# Patient Record
Sex: Female | Born: 1950 | Race: Black or African American | Hispanic: No | Smoking: Former smoker
Health system: Southern US, Community
[De-identification: ages and names within clinical notes are randomized; demographics above are authoritative.]

## PROBLEM LIST (undated history)

## (undated) DIAGNOSIS — K635 Polyp of colon: Secondary | ICD-10-CM

## (undated) DIAGNOSIS — K08109 Complete loss of teeth, unspecified cause, unspecified class: Secondary | ICD-10-CM

## (undated) DIAGNOSIS — C73 Malignant neoplasm of thyroid gland: Secondary | ICD-10-CM

## (undated) DIAGNOSIS — M25559 Pain in unspecified hip: Secondary | ICD-10-CM

## (undated) DIAGNOSIS — E079 Disorder of thyroid, unspecified: Secondary | ICD-10-CM

## (undated) DIAGNOSIS — D649 Anemia, unspecified: Secondary | ICD-10-CM

## (undated) DIAGNOSIS — E785 Hyperlipidemia, unspecified: Secondary | ICD-10-CM

## (undated) DIAGNOSIS — K759 Inflammatory liver disease, unspecified: Secondary | ICD-10-CM

## (undated) DIAGNOSIS — I5032 Chronic diastolic (congestive) heart failure: Secondary | ICD-10-CM

## (undated) DIAGNOSIS — E119 Type 2 diabetes mellitus without complications: Secondary | ICD-10-CM

## (undated) DIAGNOSIS — I10 Essential (primary) hypertension: Secondary | ICD-10-CM

## (undated) DIAGNOSIS — D126 Benign neoplasm of colon, unspecified: Secondary | ICD-10-CM

## (undated) DIAGNOSIS — I5189 Other ill-defined heart diseases: Secondary | ICD-10-CM

## (undated) DIAGNOSIS — I509 Heart failure, unspecified: Secondary | ICD-10-CM

## (undated) DIAGNOSIS — R079 Chest pain, unspecified: Secondary | ICD-10-CM

## (undated) DIAGNOSIS — E042 Nontoxic multinodular goiter: Secondary | ICD-10-CM

## (undated) DIAGNOSIS — C539 Malignant neoplasm of cervix uteri, unspecified: Secondary | ICD-10-CM

## (undated) DIAGNOSIS — E039 Hypothyroidism, unspecified: Secondary | ICD-10-CM

## (undated) DIAGNOSIS — Z95828 Presence of other vascular implants and grafts: Secondary | ICD-10-CM

## (undated) DIAGNOSIS — F1490 Cocaine use, unspecified, uncomplicated: Secondary | ICD-10-CM

## (undated) DIAGNOSIS — C3490 Malignant neoplasm of unspecified part of unspecified bronchus or lung: Secondary | ICD-10-CM

## (undated) HISTORY — DX: Malignant neoplasm of unspecified part of unspecified bronchus or lung: C34.90

## (undated) HISTORY — PX: OTHER SURGICAL HISTORY: SHX169

## (undated) HISTORY — DX: Inflammatory liver disease, unspecified: K75.9

## (undated) HISTORY — PX: ABDOMINAL HYSTERECTOMY: SHX81

---

## 2006-02-03 ENCOUNTER — Emergency Department: Payer: Self-pay

## 2006-05-07 ENCOUNTER — Inpatient Hospital Stay: Payer: Self-pay | Admitting: Specialist

## 2006-05-07 ENCOUNTER — Other Ambulatory Visit: Payer: Self-pay

## 2006-10-08 ENCOUNTER — Ambulatory Visit: Payer: Self-pay

## 2007-08-24 ENCOUNTER — Emergency Department: Payer: Self-pay | Admitting: Emergency Medicine

## 2007-11-23 ENCOUNTER — Ambulatory Visit: Payer: Self-pay

## 2008-11-14 ENCOUNTER — Ambulatory Visit: Payer: Self-pay | Admitting: Nurse Practitioner

## 2008-11-27 ENCOUNTER — Ambulatory Visit: Payer: Self-pay | Admitting: Nurse Practitioner

## 2009-02-03 ENCOUNTER — Ambulatory Visit: Payer: Self-pay | Admitting: Family Medicine

## 2009-07-31 DIAGNOSIS — E559 Vitamin D deficiency, unspecified: Secondary | ICD-10-CM | POA: Insufficient documentation

## 2010-03-06 ENCOUNTER — Ambulatory Visit: Payer: Self-pay | Admitting: Family Medicine

## 2011-05-29 DIAGNOSIS — R609 Edema, unspecified: Secondary | ICD-10-CM | POA: Insufficient documentation

## 2011-05-30 ENCOUNTER — Ambulatory Visit: Payer: Self-pay | Admitting: Family Medicine

## 2011-07-02 ENCOUNTER — Ambulatory Visit: Payer: Self-pay | Admitting: Family Medicine

## 2011-09-13 DIAGNOSIS — M720 Palmar fascial fibromatosis [Dupuytren]: Secondary | ICD-10-CM | POA: Insufficient documentation

## 2011-10-09 ENCOUNTER — Ambulatory Visit: Payer: Self-pay | Admitting: Family Medicine

## 2011-11-15 DIAGNOSIS — G629 Polyneuropathy, unspecified: Secondary | ICD-10-CM | POA: Insufficient documentation

## 2012-06-15 DIAGNOSIS — M542 Cervicalgia: Secondary | ICD-10-CM | POA: Insufficient documentation

## 2012-07-24 DIAGNOSIS — E118 Type 2 diabetes mellitus with unspecified complications: Secondary | ICD-10-CM | POA: Insufficient documentation

## 2012-07-24 DIAGNOSIS — E119 Type 2 diabetes mellitus without complications: Secondary | ICD-10-CM | POA: Insufficient documentation

## 2012-10-19 DIAGNOSIS — R32 Unspecified urinary incontinence: Secondary | ICD-10-CM | POA: Insufficient documentation

## 2012-12-31 ENCOUNTER — Encounter: Payer: Self-pay | Admitting: Family Medicine

## 2013-01-06 ENCOUNTER — Ambulatory Visit: Payer: Self-pay | Admitting: Orthopedic Surgery

## 2013-01-27 ENCOUNTER — Encounter: Payer: Self-pay | Admitting: Family Medicine

## 2013-01-29 DIAGNOSIS — K5900 Constipation, unspecified: Secondary | ICD-10-CM | POA: Insufficient documentation

## 2013-03-04 ENCOUNTER — Ambulatory Visit: Payer: Self-pay | Admitting: Family Medicine

## 2013-03-05 DIAGNOSIS — M48061 Spinal stenosis, lumbar region without neurogenic claudication: Secondary | ICD-10-CM | POA: Insufficient documentation

## 2013-09-27 ENCOUNTER — Encounter: Payer: Self-pay | Admitting: Family Medicine

## 2013-10-27 ENCOUNTER — Encounter: Payer: Self-pay | Admitting: Family Medicine

## 2013-11-01 ENCOUNTER — Emergency Department: Payer: Self-pay | Admitting: Emergency Medicine

## 2013-11-01 LAB — COMPREHENSIVE METABOLIC PANEL
Albumin: 3 g/dL — ABNORMAL LOW (ref 3.4–5.0)
Alkaline Phosphatase: 70 U/L
Anion Gap: 5 — ABNORMAL LOW (ref 7–16)
BUN: 8 mg/dL (ref 7–18)
Bilirubin,Total: 0.2 mg/dL (ref 0.2–1.0)
CALCIUM: 8.4 mg/dL — AB (ref 8.5–10.1)
Chloride: 109 mmol/L — ABNORMAL HIGH (ref 98–107)
Co2: 27 mmol/L (ref 21–32)
Creatinine: 1.13 mg/dL (ref 0.60–1.30)
EGFR (Non-African Amer.): 51 — ABNORMAL LOW
GFR CALC AF AMER: 59 — AB
Glucose: 102 mg/dL — ABNORMAL HIGH (ref 65–99)
Osmolality: 280 (ref 275–301)
Potassium: 4 mmol/L (ref 3.5–5.1)
SGOT(AST): 19 U/L (ref 15–37)
SGPT (ALT): 22 U/L (ref 12–78)
Sodium: 141 mmol/L (ref 136–145)
Total Protein: 7.3 g/dL (ref 6.4–8.2)

## 2013-11-01 LAB — CBC
HCT: 37.7 % (ref 35.0–47.0)
HGB: 12.1 g/dL (ref 12.0–16.0)
MCH: 29.1 pg (ref 26.0–34.0)
MCHC: 32.3 g/dL (ref 32.0–36.0)
MCV: 90 fL (ref 80–100)
Platelet: 347 10*3/uL (ref 150–440)
RBC: 4.17 10*6/uL (ref 3.80–5.20)
RDW: 15.8 % — ABNORMAL HIGH (ref 11.5–14.5)
WBC: 9 10*3/uL (ref 3.6–11.0)

## 2013-11-01 LAB — TROPONIN I: Troponin-I: <0.02 ng/mL

## 2013-11-01 LAB — D-DIMER(ARMC): D-Dimer: 739 ng/ml

## 2013-11-01 LAB — CK TOTAL AND CKMB (NOT AT ARMC)
CK, Total: 145 U/L
CK-MB: 1.4 ng/mL (ref 0.5–3.6)

## 2014-02-10 DIAGNOSIS — E041 Nontoxic single thyroid nodule: Secondary | ICD-10-CM | POA: Insufficient documentation

## 2014-02-10 DIAGNOSIS — F17200 Nicotine dependence, unspecified, uncomplicated: Secondary | ICD-10-CM | POA: Insufficient documentation

## 2014-04-12 ENCOUNTER — Ambulatory Visit: Payer: Self-pay | Admitting: Family Medicine

## 2014-05-17 DIAGNOSIS — E782 Mixed hyperlipidemia: Secondary | ICD-10-CM | POA: Insufficient documentation

## 2014-05-17 DIAGNOSIS — I5032 Chronic diastolic (congestive) heart failure: Secondary | ICD-10-CM | POA: Insufficient documentation

## 2014-10-10 ENCOUNTER — Other Ambulatory Visit: Payer: Self-pay | Admitting: Family Medicine

## 2014-10-10 DIAGNOSIS — F17200 Nicotine dependence, unspecified, uncomplicated: Secondary | ICD-10-CM

## 2014-10-18 ENCOUNTER — Ambulatory Visit: Payer: Self-pay

## 2014-10-25 ENCOUNTER — Ambulatory Visit: Payer: Medicare Other | Attending: Family Medicine

## 2014-10-25 DIAGNOSIS — F17210 Nicotine dependence, cigarettes, uncomplicated: Secondary | ICD-10-CM | POA: Diagnosis present

## 2014-10-25 DIAGNOSIS — F17200 Nicotine dependence, unspecified, uncomplicated: Secondary | ICD-10-CM

## 2014-10-25 MED ORDER — ALBUTEROL SULFATE (2.5 MG/3ML) 0.083% IN NEBU
2.5000 mg | INHALATION_SOLUTION | Freq: Once | RESPIRATORY_TRACT | Status: AC
  Administered 2014-10-25: 2.5 mg via RESPIRATORY_TRACT
  Filled 2014-10-25: qty 3

## 2014-11-18 DIAGNOSIS — I10 Essential (primary) hypertension: Secondary | ICD-10-CM | POA: Insufficient documentation

## 2014-11-24 DIAGNOSIS — I872 Venous insufficiency (chronic) (peripheral): Secondary | ICD-10-CM | POA: Insufficient documentation

## 2015-07-24 ENCOUNTER — Other Ambulatory Visit: Payer: Self-pay | Admitting: Family Medicine

## 2015-07-24 DIAGNOSIS — Z0000 Encounter for general adult medical examination without abnormal findings: Secondary | ICD-10-CM

## 2015-07-25 ENCOUNTER — Other Ambulatory Visit: Payer: Self-pay | Admitting: Family Medicine

## 2015-07-25 DIAGNOSIS — Z0000 Encounter for general adult medical examination without abnormal findings: Secondary | ICD-10-CM

## 2015-08-21 ENCOUNTER — Ambulatory Visit
Admission: RE | Admit: 2015-08-21 | Discharge: 2015-08-21 | Disposition: A | Discharge status: Home / Self Care | Payer: Medicare Other | Source: Ambulatory Visit | Attending: Family Medicine | Admitting: Family Medicine

## 2015-08-21 DIAGNOSIS — M85852 Other specified disorders of bone density and structure, left thigh: Secondary | ICD-10-CM | POA: Insufficient documentation

## 2015-08-21 DIAGNOSIS — Z0000 Encounter for general adult medical examination without abnormal findings: Secondary | ICD-10-CM

## 2015-08-21 DIAGNOSIS — Z1231 Encounter for screening mammogram for malignant neoplasm of breast: Secondary | ICD-10-CM | POA: Insufficient documentation

## 2015-08-24 ENCOUNTER — Other Ambulatory Visit: Payer: Self-pay | Admitting: Family Medicine

## 2015-08-24 ENCOUNTER — Ambulatory Visit: Payer: Medicare Other

## 2015-08-24 DIAGNOSIS — M79661 Pain in right lower leg: Secondary | ICD-10-CM

## 2015-08-25 ENCOUNTER — Ambulatory Visit
Admission: RE | Admit: 2015-08-25 | Discharge: 2015-08-25 | Disposition: A | Discharge status: Home / Self Care | Payer: Medicare Other | Source: Ambulatory Visit | Attending: Family Medicine | Admitting: Family Medicine

## 2015-08-25 DIAGNOSIS — M79661 Pain in right lower leg: Secondary | ICD-10-CM | POA: Insufficient documentation

## 2015-10-24 DIAGNOSIS — N3946 Mixed incontinence: Secondary | ICD-10-CM | POA: Insufficient documentation

## 2015-11-23 ENCOUNTER — Encounter: Payer: Self-pay | Admitting: *Deleted

## 2015-11-24 ENCOUNTER — Encounter: Payer: Self-pay | Admitting: *Deleted

## 2015-11-24 ENCOUNTER — Encounter
Admission: RE | Disposition: A | Discharge status: Home / Self Care | Payer: Self-pay | Source: Ambulatory Visit | Attending: Gastroenterology

## 2015-11-24 ENCOUNTER — Ambulatory Visit: Payer: Medicare Other | Admitting: Anesthesiology

## 2015-11-24 ENCOUNTER — Ambulatory Visit
Admission: RE | Admit: 2015-11-24 | Discharge: 2015-11-24 | Disposition: A | Discharge status: Home / Self Care | Payer: Medicare Other | Source: Ambulatory Visit | Attending: Gastroenterology | Admitting: Gastroenterology

## 2015-11-24 DIAGNOSIS — I10 Essential (primary) hypertension: Secondary | ICD-10-CM | POA: Diagnosis not present

## 2015-11-24 DIAGNOSIS — Z7984 Long term (current) use of oral hypoglycemic drugs: Secondary | ICD-10-CM | POA: Diagnosis not present

## 2015-11-24 DIAGNOSIS — K5730 Diverticulosis of large intestine without perforation or abscess without bleeding: Secondary | ICD-10-CM | POA: Diagnosis not present

## 2015-11-24 DIAGNOSIS — D123 Benign neoplasm of transverse colon: Secondary | ICD-10-CM | POA: Diagnosis not present

## 2015-11-24 DIAGNOSIS — Z7982 Long term (current) use of aspirin: Secondary | ICD-10-CM | POA: Diagnosis not present

## 2015-11-24 DIAGNOSIS — D125 Benign neoplasm of sigmoid colon: Secondary | ICD-10-CM | POA: Diagnosis not present

## 2015-11-24 DIAGNOSIS — E042 Nontoxic multinodular goiter: Secondary | ICD-10-CM | POA: Insufficient documentation

## 2015-11-24 DIAGNOSIS — Z79899 Other long term (current) drug therapy: Secondary | ICD-10-CM | POA: Insufficient documentation

## 2015-11-24 DIAGNOSIS — D128 Benign neoplasm of rectum: Secondary | ICD-10-CM | POA: Insufficient documentation

## 2015-11-24 DIAGNOSIS — R195 Other fecal abnormalities: Secondary | ICD-10-CM | POA: Diagnosis present

## 2015-11-24 DIAGNOSIS — Z8371 Family history of colonic polyps: Secondary | ICD-10-CM | POA: Diagnosis not present

## 2015-11-24 DIAGNOSIS — K640 First degree hemorrhoids: Secondary | ICD-10-CM | POA: Insufficient documentation

## 2015-11-24 DIAGNOSIS — E119 Type 2 diabetes mellitus without complications: Secondary | ICD-10-CM | POA: Insufficient documentation

## 2015-11-24 DIAGNOSIS — F17200 Nicotine dependence, unspecified, uncomplicated: Secondary | ICD-10-CM | POA: Insufficient documentation

## 2015-11-24 HISTORY — DX: Chest pain, unspecified: R07.9

## 2015-11-24 HISTORY — DX: Essential (primary) hypertension: I10

## 2015-11-24 HISTORY — PX: COLONOSCOPY WITH PROPOFOL: SHX5780

## 2015-11-24 HISTORY — DX: Pain in unspecified hip: M25.559

## 2015-11-24 HISTORY — DX: Nontoxic multinodular goiter: E04.2

## 2015-11-24 HISTORY — DX: Type 2 diabetes mellitus without complications: E11.9

## 2015-11-24 LAB — GLUCOSE, CAPILLARY: GLUCOSE-CAPILLARY: 110 mg/dL — AB (ref 65–99)

## 2015-11-24 SURGERY — COLONOSCOPY WITH PROPOFOL
Anesthesia: General

## 2015-11-24 MED ORDER — SODIUM CHLORIDE 0.9 % IV SOLN: INTRAVENOUS | Status: DC

## 2015-11-24 MED ORDER — MIDAZOLAM HCL 2 MG/2ML IJ SOLN
INTRAMUSCULAR | Status: DC | PRN
  Administered 2015-11-24: 1 mg via INTRAVENOUS

## 2015-11-24 MED ORDER — PHENYLEPHRINE HCL 10 MG/ML IJ SOLN
INTRAMUSCULAR | Status: DC | PRN
  Administered 2015-11-24: 200 ug via INTRAVENOUS
  Administered 2015-11-24 (×3): 100 ug via INTRAVENOUS

## 2015-11-24 MED ORDER — FENTANYL CITRATE (PF) 100 MCG/2ML IJ SOLN
INTRAMUSCULAR | Status: DC | PRN
  Administered 2015-11-24: 50 ug via INTRAVENOUS

## 2015-11-24 MED ORDER — SODIUM CHLORIDE 0.9 % IV SOLN
INTRAVENOUS | Status: DC
  Administered 2015-11-24: 08:00:00 via INTRAVENOUS

## 2015-11-24 MED ORDER — PROPOFOL 10 MG/ML IV BOLUS
INTRAVENOUS | Status: DC | PRN
  Administered 2015-11-24: 80 mg via INTRAVENOUS

## 2015-11-24 MED ORDER — PROPOFOL 500 MG/50ML IV EMUL
INTRAVENOUS | Status: DC | PRN
  Administered 2015-11-24: 100 ug/kg/min via INTRAVENOUS

## 2015-11-24 NOTE — H&P (Addendum)
Outpatient short stay form Pre-procedure 11/24/2015 8:14 AM Lollie Sails MD  Primary Physician: Dr Loma Newton  Reason for visit:  Colonoscopy  History of present illness:  Patient is a 65 year old female presenting today for colonoscopy. This is her first colonoscopy. She has a family history of colon polyps and multiple primary relatives. There is a finding of a Hemoccult-positive stool as well. She tolerated her prep well. She takes 81 mg aspirin but has held that. She takes no other aspirin products or blood thinners.    Current Facility-Administered Medications:  .  0.9 %  sodium chloride infusion, , Intravenous, Continuous, Lollie Sails, MD, Last Rate: 20 mL/hr at 11/24/15 [phone] .  0.9 %  sodium chloride infusion, , Intravenous, Continuous, Lollie Sails, MD  Prescriptions Prior to Admission  Medication Sig Dispense Refill Last Dose  . aspirin EC 81 MG tablet Take 81 mg by mouth daily.   11/20/2015  . Calcium Carbonate-Vitamin D (CALCIUM 500 + D) 500-125 MG-UNIT TABS Take by mouth.   Past Week at Unknown time  . docusate sodium (COLACE) 100 MG capsule Take 100 mg by mouth daily.     . furosemide (LASIX) 20 MG tablet Take 20 mg by mouth 2 (two) times daily.     . hydrOXYzine (ATARAX/VISTARIL) 25 MG tablet Take 25 mg by mouth daily. Take 25mg . By mouth every morning   11/24/2015 at 0800  . losartan (COZAAR) 100 MG tablet Take 100 mg by mouth daily.   11/24/2015 at 0600  . metFORMIN (GLUCOPHAGE) 500 MG tablet Take 500 mg by mouth 2 (two) times daily with a meal.   11/22/2015  . oxybutynin (DITROPAN-XL) 10 MG 24 hr tablet Take 10 mg by mouth at bedtime.     . pregabalin (LYRICA) 75 MG capsule Take 75 mg by mouth daily.   11/24/2015 at 0600  . simvastatin (ZOCOR) 20 MG tablet Take 20 mg by mouth daily.        Allergies  Allergen Reactions  . Ace Inhibitors Itching    Rash  . Cyclobenzaprine Itching    Rash and 'made me nervous'     Past Medical History:  Diagnosis Date  .  Chest pain, unspecified   . Diabetes mellitus without complication ([place])   . Hip pain   . Hypertension   . Multinodular goiter     Review of systems:      Physical Exam    Heart and lungs: Regular rate and rhythm without rub or gallop, lungs are bilaterally clear.    HEENT: Normocephalic atraumatic eyes are anicteric    Other:     Pertinant exam for procedure: Soft, obese, nontender, bowel sounds are positive normoactive.    Planned proceedures: Colonoscopy and indicated procedures. I have discussed the risks benefits and complications of procedures to include not limited to bleeding, infection, perforation and the risk of sedation and the patient wishes to proceed.    Lollie Sails, MD Gastroenterology 11/24/2015  8:14 AM

## 2015-11-24 NOTE — Op Note (Signed)
[facility] Gastroenterology Patient Name: Rebecca Parker Procedure Date: 11/24/2015 8:23 AM MRN: [id number] Account #: [id number] Date of Birth: 06/02/1950 Admit Type: Outpatient Age: 65 Room: [facility] ENDO ROOM 4 Gender: Female Note Status: Finalized Procedure:            Colonoscopy Indications:          Heme positive stool, Family history of colonic polyps                        in a first-degree relative Providers:            Lollie Sails, MD Referring MD:         Dr Juanda Crumble drew clinic, MD (Referring MD) Medicines:            Monitored Anesthesia Care Complications:        No immediate complications. Procedure:            Pre-Anesthesia Assessment:                       - ASA Grade Assessment: III - A patient with severe                        systemic disease.                       After obtaining informed consent, the colonoscope was                        passed under direct vision. Throughout the procedure,                        the patient's blood pressure, pulse, and oxygen                        saturations were monitored continuously. The                        Colonoscope was introduced through the anus and                        advanced to the the cecum, identified by appendiceal                        orifice and ileocecal valve. The colonoscopy was                        unusually difficult due to poor bowel prep. Successful                        completion of the procedure was aided by changing the                        patient to a prone position and lavage. The patient                        tolerated the procedure well. The quality of the bowel                        preparation was fair except the ascending colon was  poor. Findings:      A 10 mm polyp was found in the transverse colon. The polyp was       semi-pedunculated. The polyp was removed with a cold snare. Resection       and retrieval were complete.  Two sessile polyps were found in the distal sigmoid colon. The polyps       were 2 to 3 mm in size. These polyps were removed with a cold biopsy       forceps. Resection and retrieval were complete.      Two sessile polyps were found in the rectum. The polyps were 1 to 2 mm       in size. These polyps were removed with a cold biopsy forceps. Resection       and retrieval were complete.      Multiple small and large-mouthed diverticula were found in the sigmoid       colon, descending colon and transverse colon.      Non-bleeding internal hemorrhoids were found during retroflexion. The       hemorrhoids were medium-sized and Grade I (internal hemorrhoids that do       not prolapse).      No additional abnormalities were found on retroflexion.      The digital rectal exam was normal. Impression:           - One 10 mm polyp in the transverse colon, removed with                        a cold snare. Resected and retrieved.                       - Two 2 to 3 mm polyps in the distal sigmoid colon,                        removed with a cold biopsy forceps. Resected and                        retrieved.                       - Two 1 to 2 mm polyps in the rectum, removed with a                        cold biopsy forceps. Resected and retrieved.                       - Diverticulosis in the sigmoid colon, in the                        descending colon and in the transverse colon.                       - Non-bleeding internal hemorrhoids. Recommendation:       - Discharge patient to home.                       - Await pathology results.                       - Telephone GI clinic for pathology results in 1 week. Procedure Code(s):    --- Professional ---  45385, Colonoscopy, flexible; with removal of tumor(s),                        polyp(s), or other lesion(s) by snare technique                       45380, 59, Colonoscopy, flexible; with biopsy, single                         or multiple Diagnosis Code(s):    --- Professional ---                       D12.3, Benign neoplasm of transverse colon (hepatic                        flexure or splenic flexure)                       D12.5, Benign neoplasm of sigmoid colon                       K62.1, Rectal polyp                       K64.0, First degree hemorrhoids                       R19.5, Other fecal abnormalities                       Z83.71, Family history of colonic polyps                       K57.30, Diverticulosis of large intestine without                        perforation or abscess without bleeding CPT copyright 2016 American Medical Association. All rights reserved. The codes documented in this report are preliminary and upon coder review may  be revised to meet current compliance requirements. Lollie Sails, MD 11/24/2015 9:07:08 AM This report has been signed electronically. Number of Addenda: 0 Note Initiated On: 11/24/2015 8:23 AM Scope Withdrawal Time: 0 hours 14 minutes 5 seconds  Total Procedure Duration: 0 hours 30 minutes 43 seconds       [facility]

## 2015-11-24 NOTE — Transfer of Care (Signed)
Immediate Anesthesia Transfer of Care Note  Patient: Rebecca Parker  Procedure(s) Performed: Procedure(s): COLONOSCOPY WITH PROPOFOL (N/A)  Patient Location: PACU and Endoscopy Unit  Anesthesia Type:General  Level of Consciousness: awake and patient cooperative  Airway & Oxygen Therapy: Patient Spontanous Breathing  Post-op Assessment: Report given to RN and Post -op Vital signs reviewed and stable  Post vital signs: Reviewed and stable  Last Vitals:  Vitals:   11/24/15 0900 11/24/15 0907  BP:  (!) 100/52  Pulse:    Resp:  20  Temp: (!) 36.1 C (!) 36 C    Last Pain:  Vitals:   11/24/15 0750  TempSrc: Tympanic         Complications: No apparent anesthesia complications

## 2015-11-24 NOTE — Anesthesia Preprocedure Evaluation (Addendum)
Anesthesia Evaluation  Patient identified by MRN, date of birth, ID band Patient awake    Reviewed: Allergy & Precautions, H&P , NPO status , Patient's Chart, lab work & pertinent test results, reviewed documented beta blocker date and time   Airway Mallampati: II   Neck ROM: full    Dental  (+) Poor Dentition   Pulmonary neg pulmonary ROS, Current Smoker,    Pulmonary exam normal        Cardiovascular hypertension, negative cardio ROS Normal cardiovascular exam     Neuro/Psych negative neurological ROS  negative psych ROS   GI/Hepatic negative GI ROS, Neg liver ROS,   Endo/Other  negative endocrine ROSdiabetes  Renal/GU negative Renal ROS  negative genitourinary   Musculoskeletal   Abdominal   Peds  Hematology negative hematology ROS (+)   Anesthesia Other Findings Past Medical History: No date: Chest pain, unspecified No date: Diabetes mellitus without complication (HCC) No date: Hip pain No date: Hypertension No date: Multinodular goiter Past Surgical History: No date: right ankle orif BMI    Body Mass Index:  45.18 kg/m     Reproductive/Obstetrics                             Anesthesia Physical Anesthesia Plan  ASA: III  Anesthesia Plan: General   Post-op Pain Management:    Induction:   Airway Management Planned:   Additional Equipment:   Intra-op Plan:   Post-operative Plan:   Informed Consent: I have reviewed the patients History and Physical, chart, labs and discussed the procedure including the risks, benefits and alternatives for the proposed anesthesia with the patient or authorized representative who has indicated his/her understanding and acceptance.   Dental Advisory Given  Plan Discussed with: CRNA  Anesthesia Plan Comments:         Anesthesia Quick Evaluation

## 2015-11-25 NOTE — Anesthesia Postprocedure Evaluation (Signed)
Anesthesia Post Note  Patient: Rebecca Parker  Procedure(s) Performed: Procedure(s) (LRB): COLONOSCOPY WITH PROPOFOL (N/A)  Patient location during evaluation: PACU Anesthesia Type: General Level of consciousness: awake and alert Pain management: pain level controlled Vital Signs Assessment: post-procedure vital signs reviewed and stable Respiratory status: spontaneous breathing, nonlabored ventilation, respiratory function stable and patient connected to nasal cannula oxygen Cardiovascular status: blood pressure returned to baseline and stable Postop Assessment: no signs of nausea or vomiting Anesthetic complications: no    Last Vitals:  Vitals:   11/24/15 0930 11/24/15 0940  BP: 123/65 104/68  Pulse:    Resp:    Temp:      Last Pain:  Vitals:   11/24/15 0900  TempSrc: Tympanic                 Molli Barrows

## 2015-11-27 ENCOUNTER — Encounter: Payer: Self-pay | Admitting: Gastroenterology

## 2015-11-27 LAB — SURGICAL PATHOLOGY

## 2016-08-20 ENCOUNTER — Other Ambulatory Visit: Payer: Self-pay | Admitting: Family Medicine

## 2016-08-20 DIAGNOSIS — Z1231 Encounter for screening mammogram for malignant neoplasm of breast: Secondary | ICD-10-CM

## 2016-09-12 ENCOUNTER — Ambulatory Visit
Admission: RE | Admit: 2016-09-12 | Discharge: 2016-09-12 | Disposition: A | Discharge status: Home / Self Care | Payer: Medicare Other | Source: Ambulatory Visit | Attending: Family Medicine | Admitting: Family Medicine

## 2016-09-12 DIAGNOSIS — Z1231 Encounter for screening mammogram for malignant neoplasm of breast: Secondary | ICD-10-CM | POA: Insufficient documentation

## 2017-08-12 ENCOUNTER — Other Ambulatory Visit: Payer: Self-pay | Admitting: Family Medicine

## 2017-08-12 DIAGNOSIS — Z1231 Encounter for screening mammogram for malignant neoplasm of breast: Secondary | ICD-10-CM

## 2017-09-25 ENCOUNTER — Ambulatory Visit
Admission: RE | Admit: 2017-09-25 | Discharge: 2017-09-25 | Disposition: A | Discharge status: Home / Self Care | Payer: 59 | Source: Ambulatory Visit | Attending: Family Medicine | Admitting: Family Medicine

## 2017-09-25 ENCOUNTER — Other Ambulatory Visit: Payer: Self-pay | Admitting: Family Medicine

## 2017-09-25 DIAGNOSIS — Z1231 Encounter for screening mammogram for malignant neoplasm of breast: Secondary | ICD-10-CM | POA: Diagnosis present

## 2017-09-29 ENCOUNTER — Other Ambulatory Visit: Payer: Self-pay | Admitting: Family Medicine

## 2017-09-29 DIAGNOSIS — R928 Other abnormal and inconclusive findings on diagnostic imaging of breast: Secondary | ICD-10-CM

## 2017-09-29 DIAGNOSIS — N6320 Unspecified lump in the left breast, unspecified quadrant: Secondary | ICD-10-CM

## 2017-10-08 ENCOUNTER — Ambulatory Visit
Admission: RE | Admit: 2017-10-08 | Discharge: 2017-10-08 | Disposition: A | Discharge status: Home / Self Care | Payer: 59 | Source: Ambulatory Visit | Attending: Family Medicine | Admitting: Family Medicine

## 2017-10-08 DIAGNOSIS — N6320 Unspecified lump in the left breast, unspecified quadrant: Secondary | ICD-10-CM

## 2017-10-08 DIAGNOSIS — R928 Other abnormal and inconclusive findings on diagnostic imaging of breast: Secondary | ICD-10-CM | POA: Diagnosis not present

## 2018-05-25 ENCOUNTER — Other Ambulatory Visit: Payer: Self-pay | Admitting: Family Medicine

## 2018-05-25 DIAGNOSIS — N6320 Unspecified lump in the left breast, unspecified quadrant: Secondary | ICD-10-CM

## 2018-06-18 ENCOUNTER — Ambulatory Visit
Admission: RE | Admit: 2018-06-18 | Discharge: 2018-06-18 | Disposition: A | Discharge status: Home / Self Care | Payer: Medicare Other | Source: Ambulatory Visit | Attending: Family Medicine | Admitting: Family Medicine

## 2018-06-18 DIAGNOSIS — N6320 Unspecified lump in the left breast, unspecified quadrant: Secondary | ICD-10-CM

## 2018-06-18 DIAGNOSIS — R922 Inconclusive mammogram: Secondary | ICD-10-CM | POA: Diagnosis not present

## 2018-10-21 ENCOUNTER — Other Ambulatory Visit (HOSPITAL_COMMUNITY): Payer: Self-pay | Admitting: Family Medicine

## 2018-10-21 ENCOUNTER — Other Ambulatory Visit: Payer: Self-pay | Admitting: Family Medicine

## 2018-10-21 DIAGNOSIS — R634 Abnormal weight loss: Secondary | ICD-10-CM

## 2018-10-22 ENCOUNTER — Other Ambulatory Visit: Payer: Self-pay | Admitting: Family Medicine

## 2018-10-22 DIAGNOSIS — R634 Abnormal weight loss: Secondary | ICD-10-CM

## 2018-10-27 ENCOUNTER — Encounter: Payer: Self-pay | Admitting: *Deleted

## 2018-10-28 ENCOUNTER — Ambulatory Visit: Payer: Medicare Other

## 2018-11-04 ENCOUNTER — Other Ambulatory Visit: Payer: Self-pay

## 2018-11-04 ENCOUNTER — Ambulatory Visit
Admission: RE | Admit: 2018-11-04 | Discharge: 2018-11-04 | Disposition: A | Discharge status: Home / Self Care | Payer: Medicare Other | Source: Ambulatory Visit | Attending: Family Medicine | Admitting: Family Medicine

## 2018-11-04 DIAGNOSIS — R634 Abnormal weight loss: Secondary | ICD-10-CM | POA: Insufficient documentation

## 2018-11-04 HISTORY — DX: Heart failure, unspecified: I50.9

## 2018-11-04 LAB — POCT I-STAT CREATININE: Creatinine, Ser: 1 mg/dL (ref 0.44–1.00)

## 2018-11-04 MED ORDER — IOHEXOL 300 MG/ML  SOLN
100.0000 mL | Freq: Once | INTRAMUSCULAR | Status: AC | PRN
  Administered 2018-11-04: 100 mL via INTRAVENOUS

## 2018-12-09 ENCOUNTER — Ambulatory Visit: Payer: Medicare Other | Admitting: Gastroenterology

## 2018-12-29 ENCOUNTER — Encounter: Payer: Self-pay | Admitting: Family Medicine

## 2019-01-06 ENCOUNTER — Other Ambulatory Visit: Payer: Self-pay

## 2019-01-07 ENCOUNTER — Ambulatory Visit: Payer: Medicare Other | Admitting: Gastroenterology

## 2019-02-24 ENCOUNTER — Other Ambulatory Visit: Payer: Self-pay

## 2019-02-24 ENCOUNTER — Other Ambulatory Visit: Payer: Self-pay | Admitting: Gastroenterology

## 2019-02-24 ENCOUNTER — Other Ambulatory Visit: Payer: Medicare Other

## 2019-02-24 ENCOUNTER — Encounter: Payer: Self-pay | Admitting: Gastroenterology

## 2019-02-24 ENCOUNTER — Ambulatory Visit (INDEPENDENT_AMBULATORY_CARE_PROVIDER_SITE_OTHER): Payer: Medicare Other | Admitting: Gastroenterology

## 2019-02-24 VITALS — BP 121/89 | HR 106 | Temp 97.5°F | Ht 61.0 in | Wt 172.5 lb

## 2019-02-24 DIAGNOSIS — D649 Anemia, unspecified: Secondary | ICD-10-CM | POA: Diagnosis not present

## 2019-02-24 DIAGNOSIS — R109 Unspecified abdominal pain: Secondary | ICD-10-CM | POA: Diagnosis not present

## 2019-02-24 NOTE — Progress Notes (Signed)
Rebecca Parker [address]  [place]  [place], [place] [address]  Main: [phone]  Fax: [phone]   Gastroenterology Consultation  Referring Provider:     Elba Barman, MD Primary Care Physician:  [facility] Reason for Consultation:     Weight loss, abdominal pain, anemia          HPI:    Chief Complaint  Patient presents with  . New Patient (Initial Visit)  . Weight Loss    Patient went from 200 to 150 in 90 days     Rebecca Parker is a 68 y.o. y/o female referred for consultation & management  by Dr. Domingo Madeira, [place].  Patient reportedly has above-stated weight loss in the last 2 to 3 months.  Also reports mid abdominal pain intermittently.  No nausea or vomiting. CT scan in July 2020 for the pain did not show any acute findings to explain her symptoms.  Colonic diverticulosis without diverticulitis was noted.  Patient was also found to be anemic in May 2020 with hemoglobin of 7.5 and MCV of 75.Repeat testing in August 2020 reported normal hemoglobin around 12.  This is a new finding for her.  The patient denies anorexia, nausea or vomiting, dysphagia, change in bowel habits or black or bloody stools or weight loss.   Old records reviewed, prior colonoscopy was in 2017 with Dr. Gustavo Lah for heme positive stool.  1, 10 mm polyp and other subcentimeter polyps removed.  Fair prep noted. Pathology showed tubular adenoma.  No prior EGD.  Past Medical History:  Diagnosis Date  . Chest pain, unspecified   . CHF (congestive heart failure) ([place])   . Diabetes mellitus without complication ([place])   . Hip pain   . Hypertension   . Multinodular goiter     Past Surgical History:  Procedure Laterality Date  . COLONOSCOPY WITH PROPOFOL N/A 11/24/2015   Procedure: COLONOSCOPY WITH PROPOFOL;  Surgeon: Lollie Sails, MD;  Location: [facility] ENDOSCOPY;  Service: Endoscopy;  Laterality: N/A;  . right ankle orif       Prior to Admission medications   Medication Sig Start Date End Date Taking? Authorizing Provider  aspirin EC 81 MG tablet Take 81 mg by mouth daily.   Yes [provider]  Calcium Carbonate-Vitamin D (CALCIUM 500 + D) 500-125 MG-UNIT TABS Take by mouth.   Yes [provider]  docusate sodium (COLACE) 100 MG capsule Take 100 mg by mouth daily.   Yes [provider]  furosemide (LASIX) 20 MG tablet Take 20 mg by mouth 2 (two) times daily.   Yes [provider]  gabapentin (NEURONTIN) 800 MG tablet Take by mouth. 12/20/13  Yes [provider]  hydrochlorothiazide (HYDRODIURIL) 25 MG tablet Take by mouth.   Yes [provider]  hydrOXYzine (ATARAX/VISTARIL) 25 MG tablet Take 25 mg by mouth daily. Take 25mg . By mouth every morning   Yes [provider]  losartan (COZAAR) 100 MG tablet Take 100 mg by mouth daily.   Yes [provider]  metFORMIN (GLUCOPHAGE) 500 MG tablet Take 500 mg by mouth 2 (two) times daily with a meal.   Yes [provider]  potassium chloride (K-DUR) 10 MEQ tablet Take by mouth. 10/27/15  Yes [provider]  sertraline (ZOLOFT) 25 MG tablet Take by mouth. 10/27/15  Yes [provider]  simvastatin (ZOCOR) 20 MG tablet Take 20 mg by mouth daily.   Yes [provider]  oxybutynin (DITROPAN-XL) 10 MG 24 hr tablet Take 10 mg by mouth at bedtime.    [provider]  pregabalin (LYRICA) 75 MG capsule Take 75 mg by mouth daily.    [provider]    Family History  Problem Relation Age of Onset  . Breast cancer Maternal Aunt      Social History   Tobacco Use  . Smoking status: Current Every Day Smoker    Packs/day: 0.10    Types: Cigarettes  . Smokeless tobacco: Never Used  Substance Use Topics  . Alcohol use: Not on file  . Drug use: Not on file    Allergies as of 02/24/2019 - Review Complete 02/24/2019  Allergen Reaction Noted  . Ace inhibitors  Itching 11/23/2015  . Cyclobenzaprine Itching 11/23/2015    Review of Systems:    All systems reviewed and negative except where noted in HPI.   Physical Exam:  BP 121/89 (BP Location: Left Arm, Patient Position: Sitting, Cuff Size: Normal)   Pulse (!) 106   Temp (!) 97.5 F (36.4 C) (Oral)   Ht 5\' 1"  (1.549 m)   Wt 172 lb 8 oz (78.2 kg)   BMI 32.59 kg/m  No LMP recorded. Patient is postmenopausal. Psych:  Alert and cooperative. Normal mood and affect. General:   Alert,  Well-developed, well-nourished, pleasant and cooperative in NAD Head:  Normocephalic and atraumatic. Eyes:  Sclera clear, no icterus.   Conjunctiva pink. Ears:  Normal auditory acuity. Nose:  No deformity, discharge, or lesions. Mouth:  No deformity or lesions,oropharynx pink & moist. Neck:  Supple; no masses or thyromegaly. Abdomen:  Normal bowel sounds.  No bruits.  Soft, non-tender and non-distended without masses, hepatosplenomegaly or hernias noted.  No guarding or rebound tenderness.    Msk:  Symmetrical without gross deformities. Good, equal movement & strength bilaterally. Pulses:  Normal pulses noted. Extremities:  No clubbing or edema.  No cyanosis. Neurologic:  Alert and oriented x3;  grossly normal neurologically. Skin:  Intact without significant lesions or rashes. No jaundice. Lymph Nodes:  No significant cervical adenopathy. Psych:  Alert and cooperative. Normal mood and affect.   Labs: CBC    Component Value Date/Time   WBC 9.0 11/01/2013 1021   RBC 4.17 11/01/2013 1021   HGB 12.1 11/01/2013 1021   HCT 37.7 11/01/2013 1021   PLT 347 11/01/2013 1021   MCV 90 11/01/2013 1021   MCH 29.1 11/01/2013 1021   MCHC 32.3 11/01/2013 1021   RDW 15.8 (H) 11/01/2013 1021   CMP     Component Value Date/Time   NA 141 11/01/2013 1021   K 4.0 11/01/2013 1021   CL 109 (H) 11/01/2013 1021   CO2 27 11/01/2013 1021   GLUCOSE 102 (H) 11/01/2013 1021   BUN 8 11/01/2013 1021   CREATININE 1.00  11/04/2018 1148   CREATININE 1.13 11/01/2013 1021   CALCIUM 8.4 (L) 11/01/2013 1021   PROT 7.3 11/01/2013 1021   ALBUMIN 3.0 (L) 11/01/2013 1021   AST 19 11/01/2013 1021   ALT 22 11/01/2013 1021   ALKPHOS 70 11/01/2013 1021   BILITOT 0.2 11/01/2013 1021   GFRNONAA 51 (L) 11/01/2013 1021   GFRAA 59 (L) 11/01/2013 1021    Imaging Studies: No results found.  Assessment and Plan:   Rebecca Parker is a 68 y.o. y/o female has been referred for abdominal pain, anemia, weight loss  Patient had a fair prep on her last exam and colon polyps In  addition she has new anemia  Patient will need a colonoscopy, for polyp surveillance, and due to the anemia  However, will obtain ferritin and iron labs for [place] to see if she is iron deficient, and if present, EGD should also be done for iron deficiency anemia  Will await labs and then schedule her procedures as above  I have discussed alternative options, risks & benefits,  which include, but are not limited to, bleeding, infection, perforation,respiratory complication & drug reaction.  The patient agrees with this plan & written consent will be obtained.      Dr Rebecca Parker  Speech recognition software was used to dictate the above note.

## 2019-02-24 NOTE — Progress Notes (Signed)
T

## 2019-02-25 LAB — IRON AND TIBC
Iron Saturation: 31 % (ref 15–55)
Iron: 90 ug/dL (ref 27–139)
Total Iron Binding Capacity: 292 ug/dL (ref 250–450)
UIBC: 202 ug/dL (ref 118–369)

## 2019-02-25 LAB — COMPREHENSIVE METABOLIC PANEL
ALT: 12 IU/L (ref 0–32)
AST: 16 IU/L (ref 0–40)
Albumin/Globulin Ratio: 1.3 (ref 1.2–2.2)
Albumin: 3.9 g/dL (ref 3.8–4.8)
Alkaline Phosphatase: 66 IU/L (ref 39–117)
BUN/Creatinine Ratio: 10 — ABNORMAL LOW (ref 12–28)
BUN: 10 mg/dL (ref 8–27)
Bilirubin Total: 0.2 mg/dL (ref 0.0–1.2)
CO2: 23 mmol/L (ref 20–29)
Calcium: 9.6 mg/dL (ref 8.7–10.3)
Chloride: 103 mmol/L (ref 96–106)
Creatinine, Ser: 0.98 mg/dL (ref 0.57–1.00)
GFR calc Af Amer: 68 mL/min/{1.73_m2} (ref >59)
GFR calc non Af Amer: 59 mL/min/{1.73_m2} — ABNORMAL LOW (ref >59)
Globulin, Total: 3 g/dL (ref 1.5–4.5)
Glucose: 97 mg/dL (ref 65–99)
Potassium: 3.9 mmol/L (ref 3.5–5.2)
Sodium: 141 mmol/L (ref 134–144)
Total Protein: 6.9 g/dL (ref 6.0–8.5)

## 2019-02-25 LAB — FERRITIN: Ferritin: 33 ng/mL (ref 15–150)

## 2019-02-27 ENCOUNTER — Encounter: Payer: Self-pay | Admitting: Emergency Medicine

## 2019-02-27 ENCOUNTER — Other Ambulatory Visit: Payer: Self-pay

## 2019-02-27 ENCOUNTER — Emergency Department
Admission: EM | Admit: 2019-02-27 | Discharge: 2019-02-27 | Disposition: A | Discharge status: Home / Self Care | Payer: Medicare Other | Attending: Student | Admitting: Student

## 2019-02-27 DIAGNOSIS — I5032 Chronic diastolic (congestive) heart failure: Secondary | ICD-10-CM | POA: Diagnosis not present

## 2019-02-27 DIAGNOSIS — F17210 Nicotine dependence, cigarettes, uncomplicated: Secondary | ICD-10-CM | POA: Diagnosis not present

## 2019-02-27 DIAGNOSIS — Y999 Unspecified external cause status: Secondary | ICD-10-CM | POA: Diagnosis not present

## 2019-02-27 DIAGNOSIS — E119 Type 2 diabetes mellitus without complications: Secondary | ICD-10-CM | POA: Insufficient documentation

## 2019-02-27 DIAGNOSIS — Y929 Unspecified place or not applicable: Secondary | ICD-10-CM | POA: Insufficient documentation

## 2019-02-27 DIAGNOSIS — Z79899 Other long term (current) drug therapy: Secondary | ICD-10-CM | POA: Insufficient documentation

## 2019-02-27 DIAGNOSIS — I110 Hypertensive heart disease with heart failure: Secondary | ICD-10-CM | POA: Insufficient documentation

## 2019-02-27 DIAGNOSIS — Z7984 Long term (current) use of oral hypoglycemic drugs: Secondary | ICD-10-CM | POA: Insufficient documentation

## 2019-02-27 DIAGNOSIS — Y939 Activity, unspecified: Secondary | ICD-10-CM | POA: Diagnosis not present

## 2019-02-27 DIAGNOSIS — Z7982 Long term (current) use of aspirin: Secondary | ICD-10-CM | POA: Insufficient documentation

## 2019-02-27 DIAGNOSIS — W57XXXA Bitten or stung by nonvenomous insect and other nonvenomous arthropods, initial encounter: Secondary | ICD-10-CM | POA: Diagnosis not present

## 2019-02-27 DIAGNOSIS — S40861A Insect bite (nonvenomous) of right upper arm, initial encounter: Secondary | ICD-10-CM | POA: Diagnosis not present

## 2019-02-27 MED ORDER — MUPIROCIN 2 % EX OINT: TOPICAL_OINTMENT | CUTANEOUS | 0 refills | Status: AC

## 2019-02-27 NOTE — ED Provider Notes (Signed)
[facility] Emergency Department Provider Note  ____________________________________________   First MD Initiated Contact with Patient 02/27/19 1354     (approximate)  I have reviewed the triage vital signs and the nursing notes.   HISTORY  Chief Complaint Insect Bite    HPI Rebecca Parker is a 68 y.o. female presents to the ED complaining of a blister on the right upper arm.  She states she thinks it is a bug bite because that is what happened when her sister had a spider bite last year.  She states she is a diabetic and does not want to die from the bug bite.  She denies any fever or chills.  No redness or swelling.  Just a small blister noted at the right upper arm.  No known injury.  No injections at the site.    Past Medical History:  Diagnosis Date  . Chest pain, unspecified   . CHF (congestive heart failure) ([place])   . Diabetes mellitus without complication ([place])   . Hip pain   . Hypertension   . Multinodular goiter     Patient Active Problem List   Diagnosis Date Noted  . Morbid (severe) obesity due to excess calories ([place]) 11/15/2015  . Mixed incontinence 10/24/2015  . Venous insufficiency of both lower extremities 11/24/2014  . Benign essential hypertension 11/18/2014  . Chronic diastolic CHF (congestive heart failure), NYHA class 2 ([place]) 05/17/2014  . Mixed hyperlipidemia 05/17/2014  . Tobacco dependence 02/10/2014  . Thyroid nodule 02/10/2014  . Spinal stenosis, lumbar region without neurogenic claudication 03/05/2013  . Constipation 01/29/2013  . Eosinophil count raised 10/20/2012  . Urinary incontinence 10/19/2012  . Type 2 diabetes mellitus without complications ([place]) 99991111  . Neck pain 06/15/2012  . Polyneuropathy 11/15/2011  . Palmar fascial fibromatosis 09/13/2011  . Edema 05/29/2011  . Vitamin D deficiency 07/31/2009  . Insomnia 07/27/2009    Past Surgical History:  Procedure Laterality Date  . COLONOSCOPY WITH  PROPOFOL N/A 11/24/2015   Procedure: COLONOSCOPY WITH PROPOFOL;  Surgeon: Lollie Sails, MD;  Location: [facility] ENDOSCOPY;  Service: Endoscopy;  Laterality: N/A;  . right ankle orif      Prior to Admission medications   Medication Sig Start Date End Date Taking? Authorizing Provider  aspirin EC 81 MG tablet Take 81 mg by mouth daily.    [provider]  Calcium Carbonate-Vitamin D (CALCIUM 500 + D) 500-125 MG-UNIT TABS Take by mouth.    [provider]  docusate sodium (COLACE) 100 MG capsule Take 100 mg by mouth daily.    [provider]  furosemide (LASIX) 20 MG tablet Take 20 mg by mouth 2 (two) times daily.    [provider]  gabapentin (NEURONTIN) 800 MG tablet Take by mouth. 12/20/13   [provider]  hydrochlorothiazide (HYDRODIURIL) 25 MG tablet Take by mouth.    [provider]  hydrOXYzine (ATARAX/VISTARIL) 25 MG tablet Take 25 mg by mouth daily. Take 25mg . By mouth every morning    [provider]  losartan (COZAAR) 100 MG tablet Take 100 mg by mouth daily.    [provider]  metFORMIN (GLUCOPHAGE) 500 MG tablet Take 500 mg by mouth 2 (two) times daily with a meal.    [provider]  mupirocin ointment (BACTROBAN) 2 % Apply to affected area 2 times daily 02/27/19 02/27/20  Caryn Section, Linden Dolin, PA-C  oxybutynin (DITROPAN-XL) 10 MG 24 hr tablet Take 10 mg by mouth at bedtime.  [provider]  potassium chloride (K-DUR) 10 MEQ tablet Take by mouth. 10/27/15   [provider]  pregabalin (LYRICA) 75 MG capsule Take 75 mg by mouth daily.    [provider]  sertraline (ZOLOFT) 25 MG tablet Take by mouth. 10/27/15   [provider]  simvastatin (ZOCOR) 20 MG tablet Take 20 mg by mouth daily.    [provider]    Allergies Ace inhibitors and Cyclobenzaprine  Family History  Problem Relation Age of Onset  . Breast cancer Maternal Aunt     Social History  Social History   Tobacco Use  . Smoking status: Current Every Day Smoker    Packs/day: 0.10    Types: Cigarettes  . Smokeless tobacco: Never Used  Substance Use Topics  . Alcohol use: Not on file  . Drug use: Not on file    Review of Systems  Constitutional: No fever/chills Eyes: No visual changes. ENT: No sore throat. Respiratory: Denies cough Genitourinary: Negative for dysuria. Musculoskeletal: Negative for back pain. Skin: Negative for rash.  1 blister noted on the right upper arm    ____________________________________________   PHYSICAL EXAM:  VITAL SIGNS: ED Triage Vitals [02/27/19 1324]  Enc Vitals Group     BP (!) 114/55     Pulse Rate 88     Resp 18     Temp 98.3 F (36.8 C)     Temp Source Oral     SpO2 97 %     Weight 179 lb (81.2 kg)     Height 5\' 1"  (1.549 m)     Head Circumference      Peak Flow      Pain Score 0     Pain Loc      Pain Edu?      Excl. in [place]?     Constitutional: Alert and oriented. Well appearing and in no acute distress. Eyes: Conjunctivae are normal.  Head: Atraumatic. Nose: No congestion/rhinnorhea. Mouth/Throat: Mucous membranes are moist.   Neck:  supple no lymphadenopathy noted Cardiovascular: Normal rate, regular rhythm. Heart sounds are normal Respiratory: Normal respiratory effort.  No retractions, lungs c t a  GU: deferred Musculoskeletal: FROM all extremities, warm and well perfused Neurologic:  Normal speech and language.  Skin:  Skin is warm, dry and intact. No rash noted.  Small blister noted on the right upper extremity, area is fluctuant but no pus is noted, no redness or swelling surrounding the area Psychiatric: Mood and affect are normal. Speech and behavior are normal.  ____________________________________________   LABS (all labs ordered are listed, but only abnormal results are displayed)  Labs Reviewed - No data to display ____________________________________________    ____________________________________________  RADIOLOGY    ____________________________________________   PROCEDURES  Procedure(s) performed: No  Procedures    ____________________________________________   INITIAL IMPRESSION / ASSESSMENT AND PLAN / ED COURSE  Pertinent labs & imaging results that were available during my care of the patient were reviewed by me and considered in my medical decision making (see chart for details).   Patient is 68 year old female presents emergency department due to a blister on the right arm.  Area is not painful.  Physical exam shows 1 blister on right upper arm.  Explained to the patient that this may be a bug bite but there is no reason to be concerned.  It does not appear to be any infection.  She is to leave the blister intact as this acts as a natural  bandage.  If she is becoming worse she can apply the Bactroban ointment.  If it is spreading or becomes painful she should return emergency department follow-up with regular doctor.  States she understands will comply.  She was discharged stable condition.    ANUSHRI RELPH was evaluated in Emergency Department on 02/27/2019 for the symptoms described in the history of present illness. She was evaluated in the context of the global COVID-19 pandemic, which necessitated consideration that the patient might be at risk for infection with the SARS-CoV-2 virus that causes COVID-19. Institutional protocols and algorithms that pertain to the evaluation of patients at risk for COVID-19 are in a state of rapid change based on information released by regulatory bodies including the CDC and federal and state organizations. These policies and algorithms were followed during the patient's care in the ED.   As part of my medical decision making, I reviewed the following data within the [place] notes reviewed and incorporated, Old chart reviewed, Notes from prior ED visits and [place]  Controlled Substance Database  ____________________________________________   FINAL CLINICAL IMPRESSION(S) / ED DIAGNOSES  Final diagnoses:  Insect bite of right upper arm, initial encounter      NEW MEDICATIONS STARTED DURING THIS VISIT:  New Prescriptions   MUPIROCIN OINTMENT (BACTROBAN) 2 %    Apply to affected area 2 times daily     Note:  This document was prepared using Dragon voice recognition software and may include unintentional dictation errors.    Versie Starks, PA-C 02/27/19 1548    Lilia Pro., MD 02/27/19 2026

## 2019-02-27 NOTE — Discharge Instructions (Addendum)
Do not pop the blister.  Use the cream on the area as needed.  If worsening return emergency department or see your regular doctor.

## 2019-02-27 NOTE — ED Triage Notes (Signed)
Pt here for what she thinks is a spider bite. Pt reports her sister had something similar and it was a spider.  Pt appears to have small blister to right upper arm.  Pt denies pain or itching just wanted to get it checked.  NAD. VSS. No fever. No swelling to arm

## 2019-02-27 NOTE — ED Notes (Signed)
See triage note  Presents with possible insect bite/sting to arm  .small blister noted

## 2019-03-03 ENCOUNTER — Telehealth: Payer: Self-pay

## 2019-03-03 ENCOUNTER — Other Ambulatory Visit: Payer: Self-pay

## 2019-03-03 ENCOUNTER — Ambulatory Visit: Payer: Medicare Other

## 2019-03-03 DIAGNOSIS — R109 Unspecified abdominal pain: Secondary | ICD-10-CM

## 2019-03-03 DIAGNOSIS — Z860100 Personal history of colon polyps, unspecified: Secondary | ICD-10-CM

## 2019-03-03 DIAGNOSIS — R634 Abnormal weight loss: Secondary | ICD-10-CM

## 2019-03-03 MED ORDER — NA SULFATE-K SULFATE-MG SULF 17.5-3.13-1.6 GM/177ML PO SOLN: 354.0000 mL | Freq: Once | ORAL | 0 refills | Status: AC

## 2019-03-03 NOTE — Telephone Encounter (Signed)
-----   Message from Virgel Manifold, MD sent at 03/03/2019  1:54 PM EST ----- [name] please let the patient know, her blood work shows normal iron levels.  I would recommend proceeding with EGD and colonoscopy.  EGD for weight loss and abdominal pain.  Colonoscopy for history of polyps

## 2019-03-03 NOTE — Telephone Encounter (Signed)
Patient verbalized understanding. Patient states she will do the Colonoscopy and EGD for 03/18/2019. She will go for COVID test on 03/15/2019. Mailed instructions to patient and Sent prep to pharmacy

## 2019-03-11 ENCOUNTER — Telehealth: Payer: Self-pay

## 2019-03-11 NOTE — Telephone Encounter (Signed)
Patient is calling because patient wants to know where and when she goes for her COVID test on 03/15/2019. Explained to patient that her COVID test is at the [place] building here at [facility] between 10:30 and 12:30. Patient states what time does she go on 03/18/2019 for her procedure explain to patient we do not know and patient will not know till the day before. Patient explain she can not do that because she has to set up transportation  through [facility]. Explain to patient that she could not come by her self to the procedure someone had to be with. Patient sates she does not have anyone. Explain to patient they will not do the procedure unless she has some one. Patient states she has to have the time. Gave patient the endo unit number so she could call them and talk to them about the procedure.

## 2019-03-15 ENCOUNTER — Other Ambulatory Visit: Payer: Self-pay

## 2019-03-15 ENCOUNTER — Other Ambulatory Visit
Admission: RE | Admit: 2019-03-15 | Discharge: 2019-03-15 | Disposition: A | Discharge status: Home / Self Care | Payer: Medicare Other | Source: Ambulatory Visit | Attending: Gastroenterology | Admitting: Gastroenterology

## 2019-03-15 DIAGNOSIS — Z01812 Encounter for preprocedural laboratory examination: Secondary | ICD-10-CM | POA: Diagnosis present

## 2019-03-15 DIAGNOSIS — Z20828 Contact with and (suspected) exposure to other viral communicable diseases: Secondary | ICD-10-CM | POA: Insufficient documentation

## 2019-03-15 LAB — SARS CORONAVIRUS 2 (TAT 6-24 HRS): SARS Coronavirus 2: NEGATIVE

## 2019-03-18 ENCOUNTER — Ambulatory Visit: Payer: Medicare Other | Admitting: Anesthesiology

## 2019-03-18 ENCOUNTER — Other Ambulatory Visit: Payer: Self-pay

## 2019-03-18 ENCOUNTER — Telehealth: Payer: Self-pay

## 2019-03-18 ENCOUNTER — Ambulatory Visit
Admission: RE | Admit: 2019-03-18 | Discharge: 2019-03-18 | Disposition: A | Discharge status: Home / Self Care | Payer: Medicare Other | Source: Ambulatory Visit | Attending: Gastroenterology | Admitting: Gastroenterology

## 2019-03-18 ENCOUNTER — Encounter
Admission: RE | Disposition: A | Discharge status: Home / Self Care | Payer: Self-pay | Source: Ambulatory Visit | Attending: Gastroenterology

## 2019-03-18 DIAGNOSIS — K317 Polyp of stomach and duodenum: Secondary | ICD-10-CM | POA: Insufficient documentation

## 2019-03-18 DIAGNOSIS — E119 Type 2 diabetes mellitus without complications: Secondary | ICD-10-CM | POA: Insufficient documentation

## 2019-03-18 DIAGNOSIS — F17210 Nicotine dependence, cigarettes, uncomplicated: Secondary | ICD-10-CM | POA: Diagnosis not present

## 2019-03-18 DIAGNOSIS — Z7982 Long term (current) use of aspirin: Secondary | ICD-10-CM | POA: Insufficient documentation

## 2019-03-18 DIAGNOSIS — E042 Nontoxic multinodular goiter: Secondary | ICD-10-CM | POA: Diagnosis not present

## 2019-03-18 DIAGNOSIS — Z888 Allergy status to other drugs, medicaments and biological substances status: Secondary | ICD-10-CM | POA: Insufficient documentation

## 2019-03-18 DIAGNOSIS — D649 Anemia, unspecified: Secondary | ICD-10-CM

## 2019-03-18 DIAGNOSIS — R109 Unspecified abdominal pain: Secondary | ICD-10-CM

## 2019-03-18 DIAGNOSIS — K2950 Unspecified chronic gastritis without bleeding: Secondary | ICD-10-CM | POA: Diagnosis not present

## 2019-03-18 DIAGNOSIS — Z803 Family history of malignant neoplasm of breast: Secondary | ICD-10-CM | POA: Insufficient documentation

## 2019-03-18 DIAGNOSIS — Z86010 Personal history of colonic polyps: Secondary | ICD-10-CM | POA: Diagnosis not present

## 2019-03-18 DIAGNOSIS — Z1211 Encounter for screening for malignant neoplasm of colon: Secondary | ICD-10-CM | POA: Insufficient documentation

## 2019-03-18 DIAGNOSIS — K648 Other hemorrhoids: Secondary | ICD-10-CM | POA: Insufficient documentation

## 2019-03-18 DIAGNOSIS — I509 Heart failure, unspecified: Secondary | ICD-10-CM | POA: Diagnosis not present

## 2019-03-18 DIAGNOSIS — R634 Abnormal weight loss: Secondary | ICD-10-CM

## 2019-03-18 DIAGNOSIS — I110 Hypertensive heart disease with heart failure: Secondary | ICD-10-CM | POA: Diagnosis not present

## 2019-03-18 DIAGNOSIS — Z7984 Long term (current) use of oral hypoglycemic drugs: Secondary | ICD-10-CM | POA: Diagnosis not present

## 2019-03-18 HISTORY — PX: COLONOSCOPY WITH PROPOFOL: SHX5780

## 2019-03-18 HISTORY — PX: ESOPHAGOGASTRODUODENOSCOPY (EGD) WITH PROPOFOL: SHX5813

## 2019-03-18 LAB — GLUCOSE, CAPILLARY: Glucose-Capillary: 82 mg/dL (ref 70–99)

## 2019-03-18 SURGERY — COLONOSCOPY WITH PROPOFOL
Anesthesia: General

## 2019-03-18 MED ORDER — PROPOFOL 10 MG/ML IV BOLUS
INTRAVENOUS | Status: DC | PRN
  Administered 2019-03-18: 40 mg via INTRAVENOUS
  Administered 2019-03-18: 20 mg via INTRAVENOUS

## 2019-03-18 MED ORDER — LIDOCAINE HCL (CARDIAC) PF 100 MG/5ML IV SOSY
PREFILLED_SYRINGE | INTRAVENOUS | Status: DC | PRN
  Administered 2019-03-18: 60 mg via INTRAVENOUS

## 2019-03-18 MED ORDER — PROPOFOL 500 MG/50ML IV EMUL
INTRAVENOUS | Status: DC | PRN
  Administered 2019-03-18: 75 ug/kg/min via INTRAVENOUS

## 2019-03-18 MED ORDER — EPHEDRINE SULFATE 50 MG/ML IJ SOLN
INTRAMUSCULAR | Status: DC | PRN
  Administered 2019-03-18 (×2): 5 mg via INTRAVENOUS

## 2019-03-18 MED ORDER — NA SULFATE-K SULFATE-MG SULF 17.5-3.13-1.6 GM/177ML PO SOLN: 708.0000 mL | Freq: Once | ORAL | 0 refills | Status: AC

## 2019-03-18 MED ORDER — BISACODYL EC 5 MG PO TBEC: DELAYED_RELEASE_TABLET | ORAL | 0 refills | Status: DC

## 2019-03-18 MED ORDER — ONDANSETRON HCL 4 MG/2ML IJ SOLN
INTRAMUSCULAR | Status: DC | PRN
  Administered 2019-03-18: 4 mg via INTRAVENOUS

## 2019-03-18 MED ORDER — SODIUM CHLORIDE 0.9 % IV SOLN
INTRAVENOUS | Status: DC
  Administered 2019-03-18: 09:00:00 via INTRAVENOUS

## 2019-03-18 NOTE — H&P (Signed)
Vonda Antigua, MD [address], [place], [place], [place], [address] [id number], [place], [place], [place], [address] Phone: [phone]  Fax: [phone]  Primary Care Physician:  [facility]   Pre-Procedure History & Physical: HPI:  Rebecca Parker is a 68 y.o. female is here for a colonoscopy and EGD.   Past Medical History:  Diagnosis Date  . Chest pain, unspecified   . CHF (congestive heart failure) ([place])   . Diabetes mellitus without complication ([place])   . Hip pain   . Hypertension   . Multinodular goiter     Past Surgical History:  Procedure Laterality Date  . COLONOSCOPY WITH PROPOFOL N/A 11/24/2015   Procedure: COLONOSCOPY WITH PROPOFOL;  Surgeon: Lollie Sails, MD;  Location: [facility] ENDOSCOPY;  Service: Endoscopy;  Laterality: N/A;  . right ankle orif      Prior to Admission medications   Medication Sig Start Date End Date Taking? Authorizing Provider  aspirin EC 81 MG tablet Take 81 mg by mouth daily.   Yes [provider]  Calcium Carbonate-Vitamin D (CALCIUM 500 + D) 500-125 MG-UNIT TABS Take by mouth.   Yes [provider]  furosemide (LASIX) 20 MG tablet Take 20 mg by mouth 2 (two) times daily.   Yes [provider]  gabapentin (NEURONTIN) 800 MG tablet Take by mouth. 12/20/13  Yes [provider]  hydrochlorothiazide (HYDRODIURIL) 25 MG tablet Take by mouth.   Yes [provider]  hydrOXYzine (ATARAX/VISTARIL) 25 MG tablet Take 25 mg by mouth daily. Take 25mg . By mouth every morning   Yes [provider]  losartan (COZAAR) 100 MG tablet Take 100 mg by mouth daily.   Yes [provider]  metFORMIN (GLUCOPHAGE) 500 MG tablet Take 500 mg by mouth 2 (two) times daily with a meal.   Yes [provider]  potassium chloride (K-DUR) 10 MEQ tablet Take by mouth. 10/27/15  Yes [provider]  sertraline (ZOLOFT) 25 MG tablet Take by mouth. 10/27/15  Yes  [provider]  simvastatin (ZOCOR) 20 MG tablet Take 20 mg by mouth daily.   Yes [provider]  docusate sodium (COLACE) 100 MG capsule Take 100 mg by mouth daily.    [provider]  mupirocin ointment (BACTROBAN) 2 % Apply to affected area 2 times daily 02/27/19 02/27/20  Fisher, Linden Dolin, PA-C  oxybutynin (DITROPAN-XL) 10 MG 24 hr tablet Take 10 mg by mouth at bedtime.    [provider]  pregabalin (LYRICA) 75 MG capsule Take 75 mg by mouth daily.    [provider]    Allergies as of 03/03/2019 - Review Complete 02/27/2019  Allergen Reaction Noted  . Ace inhibitors Itching 11/23/2015  . Cyclobenzaprine Itching 11/23/2015    Family History  Problem Relation Age of Onset  . Breast cancer Maternal Aunt     Social History   Socioeconomic History  . Marital status: Legally Separated    Spouse name: Not on file  . Number of children: Not on file  . Years of education: Not on file  . Highest education level: Not on file  Occupational History  . Not on file  Social Needs  . Financial resource strain: Not on file  . Food insecurity    Worry: Not on file    Inability: Not on file  . Transportation needs    Medical: Not on file    Non-medical: Not on file  Tobacco Use  .  Smoking status: Current Every Day Smoker    Packs/day: 0.10    Types: Cigarettes  . Smokeless tobacco: Never Used  Substance and Sexual Activity  . Alcohol use: Not on file  . Drug use: Not on file  . Sexual activity: Not on file  Lifestyle  . Physical activity    Days per week: Not on file    Minutes per session: Not on file  . Stress: Not on file  Relationships  . Social Herbalist on phone: Not on file    Gets together: Not on file    Attends religious service: Not on file    Active member of club or organization: Not on file    Attends meetings of clubs or organizations: Not on file    Relationship status: Not on file  . Intimate  partner violence    Fear of current or ex partner: Not on file    Emotionally abused: Not on file    Physically abused: Not on file    Forced sexual activity: Not on file  Other Topics Concern  . Not on file  Social History Narrative  . Not on file    Review of Systems: See HPI, otherwise negative ROS  Physical Exam: There were no vitals taken for this visit. General:   Alert,  pleasant and cooperative in NAD Head:  Normocephalic and atraumatic. Neck:  Supple; no masses or thyromegaly. Lungs:  Clear throughout to auscultation, normal respiratory effort.    Heart:  +S1, +S2, Regular rate and rhythm, No edema. Abdomen:  Soft, nontender and nondistended. Normal bowel sounds, without guarding, and without rebound.   Neurologic:  Alert and  oriented x4;  grossly normal neurologically.  Impression/Plan: Rebecca Parker is here for a colonoscopy to be performed for history of adenoma polyps and EGD for abdominal pain, weight loss  Risks, benefits, limitations, and alternatives regarding the procedures have been reviewed with the patient.  Questions have been answered.  All parties agreeable.   Virgel Manifold, MD  03/18/2019, 8:57 AM

## 2019-03-18 NOTE — Anesthesia Postprocedure Evaluation (Signed)
Anesthesia Post Note  Patient: Rebecca Parker  Procedure(s) Performed: COLONOSCOPY WITH PROPOFOL (N/A ) ESOPHAGOGASTRODUODENOSCOPY (EGD) WITH PROPOFOL (N/A )  Anesthesia Type: General     Last Vitals:  Vitals:   03/18/19 1019 03/18/19 1022  BP: 96/62   Pulse: 86   Resp: 19   Temp: 36.6 C (!) 36.3 C  SpO2: 100%     Last Pain:  Vitals:   03/18/19 1019  TempSrc: Temporal  PainSc: 0-No pain                 Dierdre Forth Pat Sires

## 2019-03-18 NOTE — Telephone Encounter (Signed)
Tried to call patient on mobile number but voicemail is not set up. The home number kept ringing but no one would answer

## 2019-03-18 NOTE — Telephone Encounter (Signed)
Patient states that her sister got mad she had to stay in the car during the procedure. She states her sister was cold. Patient states her son came in with her. Patient states that when she got back in the car her sister yelled at her and patient was crying when I called patient. Patient states she needs to know the exact time of her procedure and covid test so she can call the transportation through insurance. Patient son will come with patient during procedure again. Called and talk to Kieth Brightly she states we can do 04/06/2019 and patient can arrive at 8:00. Patient verbalized understanding and explained patient the 2 day prep instructions. Mailed them to patient and sent prep to pharmacy for patient.

## 2019-03-18 NOTE — Transfer of Care (Signed)
Immediate Anesthesia Transfer of Care Note  Patient: Rebecca Parker  Procedure(s) Performed: COLONOSCOPY WITH PROPOFOL (N/A ) ESOPHAGOGASTRODUODENOSCOPY (EGD) WITH PROPOFOL (N/A )  Patient Location: PACU  Anesthesia Type:MAC  Level of Consciousness: awake, alert  and oriented  Airway & Oxygen Therapy: Patient Spontanous Breathing and Patient connected to face mask oxygen  Post-op Assessment: Report given to RN and Post -op Vital signs reviewed and stable  Post vital signs: Reviewed and stable  Last Vitals:  Vitals Value Taken Time  BP 126/77 03/18/19 0848  Temp 36.4 C 03/18/19 0848  Pulse 73 03/18/19 0848  Resp 20 03/18/19 0848  SpO2 100 % 03/18/19 0848    Last Pain:  Vitals:   03/18/19 0848  TempSrc: Temporal  PainSc: 9          Complications: No apparent anesthesia complications

## 2019-03-18 NOTE — Anesthesia Preprocedure Evaluation (Signed)
Anesthesia Evaluation  Patient identified by MRN, date of birth, ID band Patient awake    Reviewed: Allergy & Precautions, H&P , NPO status , Patient's Chart, lab work & pertinent test results, reviewed documented beta blocker date and time   History of Anesthesia Complications Negative for: history of anesthetic complications  Airway Mallampati: II   Neck ROM: full    Dental  (+) Poor Dentition, Dental Advidsory Given   Pulmonary neg shortness of breath, neg COPD, neg recent URI, Current SmokerPatient did not abstain from smoking.,    Pulmonary exam normal        Cardiovascular Exercise Tolerance: Poor hypertension, (-) angina+CHF  (-) Past MI and (-) Cardiac Stents Normal cardiovascular exam(-) dysrhythmias (-) Valvular Problems/Murmurs     Neuro/Psych negative neurological ROS  negative psych ROS   GI/Hepatic negative GI ROS, Neg liver ROS,   Endo/Other  diabetes  Renal/GU negative Renal ROS  negative genitourinary   Musculoskeletal   Abdominal   Peds  Hematology negative hematology ROS (+)   Anesthesia Other Findings Past Medical History: No date: Chest pain, unspecified No date: Diabetes mellitus without complication (HCC) No date: Hip pain No date: Hypertension No date: Multinodular goiter Past Surgical History: No date: right ankle orif BMI    Body Mass Index:  45.18 kg/m     Reproductive/Obstetrics                             Anesthesia Physical  Anesthesia Plan  ASA: III  Anesthesia Plan: General   Post-op Pain Management:    Induction: Intravenous  PONV Risk Score and Plan: 2 and Propofol infusion and TIVA  Airway Management Planned: Natural Airway and Nasal Cannula  Additional Equipment:   Intra-op Plan:   Post-operative Plan:   Informed Consent: I have reviewed the patients History and Physical, chart, labs and discussed the procedure including the  risks, benefits and alternatives for the proposed anesthesia with the patient or authorized representative who has indicated his/her understanding and acceptance.     Dental Advisory Given  Plan Discussed with: CRNA  Anesthesia Plan Comments:         Anesthesia Quick Evaluation

## 2019-03-18 NOTE — Op Note (Signed)
[facility]astroenterology Patient Name: Rebecca Parker Procedure Date: 03/18/2019 9:07 AM MRN: [id number] Account #: [id number] Date of Birth: 08/03/1950 Admit Type: Outpatient Age: 68 Room: [facility] ENDO ROOM 2 Gender: Female Note Status: Finalized Procedure:             Colonoscopy Indications:           High risk colon cancer surveillance: Personal history                         of colonic polyps Providers:             Aidaly Cordner B. Bonna Gains MD, MD Referring MD:          Forest Gleason Md, MD (Referring MD) Medicines:             Monitored Anesthesia Care Complications:         No immediate complications. Procedure:             Pre-Anesthesia Assessment:                        - Prior to the procedure, a History and Physical was                         performed, and patient medications, allergies and                         sensitivities were reviewed. The patient's tolerance                         of previous anesthesia was reviewed.                        - The risks and benefits of the procedure and the                         sedation options and risks were discussed with the                         patient. All questions were answered and informed                         consent was obtained.                        - Patient identification and proposed procedure were                         verified prior to the procedure by the physician, the                         nurse, the anesthesiologist, the anesthetist and the                         technician. The procedure was verified in the                         pre-procedure area in the procedure room in the  endoscopy suite.                        - Prophylactic Antibiotics: The patient does not                         require prophylactic antibiotics.                        - ASA Grade Assessment: II - A patient with mild                         systemic disease.                        -  After reviewing the risks and benefits, the patient                         was deemed in satisfactory condition to undergo the                         procedure.                        - Monitored anesthesia care was determined to be                         medically necessary for this procedure based on review                         of the patient's medical history, medications, and                         prior anesthesia history.                        - The anesthesia plan was to use monitored anesthesia                         care (MAC).                        After obtaining informed consent, the colonoscope was                         passed under direct vision. Throughout the procedure,                         the patient's blood pressure, pulse, and oxygen                         saturations were monitored continuously. The                         Colonoscope was introduced through the anus and                         advanced to the the cecum, identified by its                         appearance. The colonoscopy was performed  with ease.                         The patient tolerated the procedure well. The quality                         of the bowel preparation was poor. Findings:      Hemorrhoids were found on perianal exam.      A large amount of semi-solid solid stool was found in the entire colon,       interfering with visualization. Lavage of the area was performed,       resulting in incomplete clearance with continued poor visualization.      The exam was otherwise without abnormality.      Non-bleeding internal hemorrhoids were found during retroflexion. Impression:            - Preparation of the colon was poor.                        - Hemorrhoids found on perianal exam.                        - Stool in the entire examined colon.                        - The examination was otherwise normal.                        - Non-bleeding internal hemorrhoids.                         - No specimens collected. Recommendation:        - Repeat colonoscopy at next available appointment                         (within 3 months), with 2 day prep, because the bowel                         preparation was poor.                        - Return to my office as previously scheduled.                        - Return to primary care physician as previously                         scheduled.                        - Continue present medications.                        - The findings and recommendations were discussed with                         the patient. Procedure Code(s):     --- Professional ---                        [phone], Colonoscopy, flexible; diagnostic, including  collection of specimen(s) by brushing or washing, when                         performed (separate procedure) Diagnosis Code(s):     --- Professional ---                        Z86.010, Personal history of colonic polyps CPT copyright 2019 [employer]. All rights reserved. The codes documented in this report are preliminary and upon coder review may  be revised to meet current compliance requirements.  Vonda Antigua, MD Margretta Sidle B. Bonna Gains MD, MD 03/18/2019 10:17:29 AM This report has been signed electronically. Number of Addenda: 0 Note Initiated On: 03/18/2019 9:07 AM Scope Withdrawal Time: 0 hours 6 minutes 26 seconds  Total Procedure Duration: 0 hours 28 minutes 14 seconds  Estimated Blood Loss:  Estimated blood loss: none.      [facility]

## 2019-03-18 NOTE — Op Note (Signed)
[facility] Gastroenterology Patient Name: Rebecca Parker Procedure Date: 03/18/2019 9:08 AM MRN: [id number] Account #: [id number] Date of Birth: 1950/09/03 Admit Type: Outpatient Age: 68 Room: [facility] ENDO ROOM 2 Gender: Female Note Status: Finalized Procedure:             Upper GI endoscopy Indications:           Epigastric abdominal pain, Weight loss Providers:             Zaydyn Havey B. Bonna Gains MD, MD Referring MD:          Forest Gleason Md, MD (Referring MD) Medicines:             Monitored Anesthesia Care Complications:         No immediate complications. Procedure:             Pre-Anesthesia Assessment:                        - The risks and benefits of the procedure and the                         sedation options and risks were discussed with the                         patient. All questions were answered and informed                         consent was obtained.                        - Patient identification and proposed procedure were                         verified prior to the procedure.                        - ASA Grade Assessment: II - A patient with mild                         systemic disease.                        After obtaining informed consent, the endoscope was                         passed under direct vision. Throughout the procedure,                         the patient's blood pressure, pulse, and oxygen                         saturations were monitored continuously. The Endoscope                         was introduced through the mouth, and advanced to the                         second part of duodenum. The upper GI endoscopy was  accomplished with ease. The patient tolerated the                         procedure well. Findings:      The examined esophagus was normal.      Multiple 4 to 7 mm sessile polyps with no stigmata of recent bleeding       were found in the gastric body. Biopsies were taken with a cold  forceps       for histology. One polyp was in the antrum and was biopsied. rest were       in the body and 2 of the largest ones were biopsied.      There is no endoscopic evidence of ulceration or mass in the entire       examined stomach. Biopsies were taken with a cold forceps for       Helicobacter pylori testing.      The examined duodenum was normal. Impression:            - Normal esophagus.                        - Multiple gastric polyps. Biopsied.                        - Normal examined duodenum. Recommendation:        - Await pathology results.                        - Continue present medications.                        - Return to my office as previously scheduled.                        - Return to primary care physician as previously                         scheduled.                        - The findings and recommendations were discussed with                         the patient.                        - The findings and recommendations were discussed with                         the patient's family. Procedure Code(s):     --- Professional ---                        [phone], Esophagogastroduodenoscopy, flexible,                         transoral; with biopsy, single or multiple Diagnosis Code(s):     --- Professional ---                        K31.7, Polyp of stomach and duodenum  R10.13, Epigastric pain                        R63.4, Abnormal weight loss CPT copyright 2019 American Medical Association. All rights reserved. The codes documented in this report are preliminary and upon coder review may  be revised to meet current compliance requirements.  Vonda Antigua, MD Margretta Sidle B. Bonna Gains MD, MD 03/18/2019 9:39:43 AM This report has been signed electronically. Number of Addenda: 0 Note Initiated On: 03/18/2019 9:08 AM Estimated Blood Loss:  Estimated blood loss: none.      [facility]

## 2019-03-18 NOTE — Telephone Encounter (Signed)
Patient needs a 2 day prep colonoscopy because patient was not clean out with her prep on 03/18/2019. Called patient and left a message for call back

## 2019-03-18 NOTE — Anesthesia Post-op Follow-up Note (Signed)
Anesthesia QCDR form completed.        

## 2019-03-19 ENCOUNTER — Encounter: Payer: Self-pay | Admitting: Gastroenterology

## 2019-03-22 LAB — SURGICAL PATHOLOGY

## 2019-03-23 ENCOUNTER — Telehealth: Payer: Self-pay

## 2019-03-23 NOTE — Telephone Encounter (Signed)
Called patient to see if patient could be moved to a different day because Dr. Bonna Gains is in [place] that day. Tried to call patient but patient will not be back till 2pm.

## 2019-03-23 NOTE — Telephone Encounter (Signed)
Moved patient to 04/08/2019 and COVID test on 04/05/2019

## 2019-03-24 ENCOUNTER — Telehealth: Payer: Self-pay

## 2019-03-24 NOTE — Telephone Encounter (Signed)
Patient verbalized understanding. States she will cancel her appointment for 04/08/2019 and rescheduled for 3-6 months. Called endo and cancel it and put her on the recall list

## 2019-03-24 NOTE — Telephone Encounter (Signed)
-----   Message from Virgel Manifold, MD sent at 03/24/2019  9:42 AM EST ----- [name] please let the patient know, the biopsies from her stomach did not show any signs of infection.  It did show changes called Intestinal metaplasia that needs to be followed up with repeat biopsies.  I recommend a repeat EGD at the time of her repeat colonoscopy (which she needs with a 2-day prep due to poor prep on her last 1).  Please set recall for repeat procedures within 3 to 6 months

## 2019-04-08 ENCOUNTER — Ambulatory Visit: Admit: 2019-04-08 | Payer: Medicare Other | Admitting: Gastroenterology

## 2019-04-08 SURGERY — COLONOSCOPY WITH PROPOFOL
Anesthesia: General

## 2020-08-29 ENCOUNTER — Other Ambulatory Visit: Payer: Self-pay | Admitting: Family Medicine

## 2020-08-29 DIAGNOSIS — Z1231 Encounter for screening mammogram for malignant neoplasm of breast: Secondary | ICD-10-CM

## 2020-11-17 ENCOUNTER — Other Ambulatory Visit: Payer: Self-pay

## 2020-11-17 DIAGNOSIS — F17210 Nicotine dependence, cigarettes, uncomplicated: Secondary | ICD-10-CM | POA: Diagnosis not present

## 2020-11-17 DIAGNOSIS — I110 Hypertensive heart disease with heart failure: Secondary | ICD-10-CM | POA: Diagnosis not present

## 2020-11-17 DIAGNOSIS — Z79899 Other long term (current) drug therapy: Secondary | ICD-10-CM | POA: Diagnosis not present

## 2020-11-17 DIAGNOSIS — R519 Headache, unspecified: Secondary | ICD-10-CM | POA: Diagnosis present

## 2020-11-17 DIAGNOSIS — E119 Type 2 diabetes mellitus without complications: Secondary | ICD-10-CM | POA: Diagnosis not present

## 2020-11-17 DIAGNOSIS — I5032 Chronic diastolic (congestive) heart failure: Secondary | ICD-10-CM | POA: Diagnosis not present

## 2020-11-17 DIAGNOSIS — Z7984 Long term (current) use of oral hypoglycemic drugs: Secondary | ICD-10-CM | POA: Diagnosis not present

## 2020-11-17 DIAGNOSIS — Z7982 Long term (current) use of aspirin: Secondary | ICD-10-CM | POA: Insufficient documentation

## 2020-11-17 DIAGNOSIS — R55 Syncope and collapse: Secondary | ICD-10-CM | POA: Insufficient documentation

## 2020-11-17 DIAGNOSIS — R531 Weakness: Secondary | ICD-10-CM | POA: Diagnosis not present

## 2020-11-17 NOTE — ED Triage Notes (Addendum)
Pt states increased weakness for the past few days, pt states she had syncopal episode last night. Pt states shes had decrease appetite. Pt ambulatory to triage. Pt states she just found out she had Hep C

## 2020-11-18 ENCOUNTER — Emergency Department
Admission: EM | Admit: 2020-11-18 | Discharge: 2020-11-18 | Disposition: A | Discharge status: Home / Self Care | Payer: Medicare Other | Attending: Emergency Medicine | Admitting: Emergency Medicine

## 2020-11-18 ENCOUNTER — Encounter: Payer: Self-pay | Admitting: Radiology

## 2020-11-18 ENCOUNTER — Emergency Department: Payer: Medicare Other

## 2020-11-18 DIAGNOSIS — R55 Syncope and collapse: Secondary | ICD-10-CM

## 2020-11-18 DIAGNOSIS — R519 Headache, unspecified: Secondary | ICD-10-CM | POA: Diagnosis not present

## 2020-11-18 LAB — CBC
HCT: 33.9 % — ABNORMAL LOW (ref 36.0–46.0)
Hemoglobin: 11.4 g/dL — ABNORMAL LOW (ref 12.0–15.0)
MCH: 32.4 pg (ref 26.0–34.0)
MCHC: 33.6 g/dL (ref 30.0–36.0)
MCV: 96.3 fL (ref 80.0–100.0)
Platelets: 498 10*3/uL — ABNORMAL HIGH (ref 150–400)
RBC: 3.52 MIL/uL — ABNORMAL LOW (ref 3.87–5.11)
RDW: 25.2 % — ABNORMAL HIGH (ref 11.5–15.5)
WBC: 13.8 10*3/uL — ABNORMAL HIGH (ref 4.0–10.5)
nRBC: 0 % (ref 0.0–0.2)

## 2020-11-18 LAB — BASIC METABOLIC PANEL
Anion gap: 5 (ref 5–15)
BUN: 12 mg/dL (ref 8–23)
CO2: 29 mmol/L (ref 22–32)
Calcium: 8.6 mg/dL — ABNORMAL LOW (ref 8.9–10.3)
Chloride: 104 mmol/L (ref 98–111)
Creatinine, Ser: 1.05 mg/dL — ABNORMAL HIGH (ref 0.44–1.00)
GFR, Estimated: 56 mL/min — ABNORMAL LOW (ref >60)
Glucose, Bld: 115 mg/dL — ABNORMAL HIGH (ref 70–99)
Potassium: 4.7 mmol/L (ref 3.5–5.1)
Sodium: 138 mmol/L (ref 135–145)

## 2020-11-18 MED ORDER — ACETAMINOPHEN 325 MG PO TABS
650.0000 mg | ORAL_TABLET | Freq: Once | ORAL | Status: AC
  Administered 2020-11-18: 650 mg via ORAL
  Filled 2020-11-18: qty 2

## 2020-11-18 MED ORDER — IOHEXOL 350 MG/ML SOLN
75.0000 mL | Freq: Once | INTRAVENOUS | Status: AC | PRN
  Administered 2020-11-18: 75 mL via INTRAVENOUS

## 2020-11-18 NOTE — ED Notes (Signed)
IV attempted x2 by this RN. Pt would not lay still and jerked arms back multiple times. IV team consult placed.

## 2020-11-18 NOTE — ED Notes (Signed)
ED provider at bedside, for assessment and discussing POC. Pt. States she does not want to stay if wait is going to be too long. MD explains her symptoms are significant and she needs further testing. Pt. Is unsure whether she will stay for treatment, will wait for update.

## 2020-11-18 NOTE — ED Notes (Addendum)
ED Provider at bedside. 

## 2020-11-18 NOTE — ED Notes (Signed)
Pt got up to urinate in toilet. Hat placed for specimen collection. Pt advised she did not want to wait any longer for a IV and CT. Said she was leaving. MD Ellender Hose made aware and he was to print her discharge papers AMA>

## 2020-11-18 NOTE — ED Notes (Signed)
Pt. Has multiple complaints, weakness, HA, syncope, and weight loss x 1 week. Pt. Attributes symptoms to Hepatitis C, and received this diagnosis 1 week pta. Pt. Is concerned that she is not on medications for this diagnosis yet. Pt. Denies pain, dizziness, visual disturbance or weakness currently.

## 2020-11-18 NOTE — ED Provider Notes (Signed)
Assumed care from Dr. Karma Greaser at 7 AM. Briefly, the patient is a 70 y.o. female with PMHx of  has a past medical history of Chest pain, unspecified, CHF (congestive heart failure) ([place]), Diabetes mellitus without complication ([place]), Hip pain, Hypertension, and Multinodular goiter. here with transient headache, ? Syncopal episode now resolved. Labs reassuring. EKG nonischemic. Pt awaiting CT Angio. If negative, can likely d/c.   Labs Reviewed  BASIC METABOLIC PANEL - Abnormal; Notable for the following components:      Result Value   Glucose, Bld 115 (*)    Creatinine, Ser 1.05 (*)    Calcium 8.6 (*)    GFR, Estimated 56 (*)    All other components within normal limits  CBC - Abnormal; Notable for the following components:   WBC 13.8 (*)    RBC 3.52 (*)    Hemoglobin 11.4 (*)    HCT 33.9 (*)    RDW 25.2 (*)    Platelets 498 (*)    All other components within normal limits  URINALYSIS, COMPLETE (UACMP) WITH MICROSCOPIC    Course of Care: CT angio reviewed, is negative for aneurysm or other abnormality.  Atherosclerosis noted but patient is already on ASA. She remains HDS, neuro intact. No ectopy noted on telemetry.  Reviewed labs which are unremarkable.  Given patient's stable exam, monitoring, absence of any high risk findings on CT, absence of headache or neurological deficits now, will discharge with outpatient follow-up.     Duffy Bruce, MD 11/18/20 [phone]

## 2020-11-18 NOTE — Discharge Instructions (Addendum)
Your CT scan looked okay today  You have a small amount of atherosclerosis, or plaque, in your arteries. This is likely age related  I'd recommend taking a LOW-DOSE (BABY) ASPIRIN DAILY  Call your primary doctor to discuss your ER visit - you should have further imaging done in the next few weeks of your carotids

## 2020-11-18 NOTE — ED Notes (Signed)
ED provider at bedside, further discussing POC. Pt. States she does not want to stay if wait is going to be too long. MD explains her symptoms are significant and she needs further testing. Pt. Is unsure whether she will stay for treatment, will wait for update.

## 2020-11-18 NOTE — ED Provider Notes (Signed)
[facility] Emergency Department Provider Note  ____________________________________________   Event Date/Time   First MD Initiated Contact with Patient 11/18/20 [phone]     (approximate)  I have reviewed the triage vital signs and the nursing notes.   HISTORY  Chief Complaint Weakness    HPI Rebecca Parker is a 70 y.o. female with medical history as listed below who presents for evaluation of headache and passing out.  She reports that about a day and a half ago she was at a friend's house in the evening and they were laughing and having a good time.  She was seated and then all of a sudden had acute onset sharp pain in her head and she passed out.  She woke up quickly and there was no reported seizure-like activity but she continued to have a headache all throughout the day yesterday.  She tried to go to her primary care doctor but was unable to get an appointment so she came to the emergency department.  Due to overwhelming ED and hospital patient volumes and boarding, she has been in the emergency department for about 11 hours.  She says she still has a mild headache but it has gotten better.   She currently has no weakness or numbness in her extremities but when the episode first happened she had some visual impairment.  That has resolved.  She denies nausea, vomiting, chest pain, shortness of breath, abdominal pain, and dysuria.  The onset was acute and the episode was severe and nothing in particular made it better or worse.  She reports that she has a history of CHF but no known history of ACS/CAD and she has never passed out before.     Past Medical History:  Diagnosis Date   Chest pain, unspecified    CHF (congestive heart failure) ([place])    Diabetes mellitus without complication ([place])    Hip pain    Hypertension    Multinodular goiter     Patient Active Problem List   Diagnosis Date Noted   Morbid (severe) obesity due to excess calories ([place])  11/15/2015   Mixed incontinence 10/24/2015   Venous insufficiency of both lower extremities 11/24/2014   Benign essential hypertension 11/18/2014   Chronic diastolic CHF (congestive heart failure), NYHA class 2 ([place]) 05/17/2014   Mixed hyperlipidemia 05/17/2014   Tobacco dependence 02/10/2014   Thyroid nodule 02/10/2014   Spinal stenosis, lumbar region without neurogenic claudication 03/05/2013   Constipation 01/29/2013   Eosinophil count raised 10/20/2012   Urinary incontinence 10/19/2012   Type 2 diabetes mellitus without complications ([place]) [id number]   Neck pain 06/15/2012   Polyneuropathy 11/15/2011   Palmar fascial fibromatosis 09/13/2011   Edema 05/29/2011   Vitamin D deficiency 07/31/2009   Insomnia 07/27/2009    Past Surgical History:  Procedure Laterality Date   COLONOSCOPY WITH PROPOFOL N/A 11/24/2015   Procedure: COLONOSCOPY WITH PROPOFOL;  Surgeon: Lollie Sails, MD;  Location[facility] ENDOSCOPY;  Service: Endoscopy;  Laterality: N/A;   COLONOSCOPY WITH PROPOFOL N/A 03/18/2019   Procedure: COLONOSCOPY WITH PROPOFOL;  Surgeon: Virgel Manifold, MD;  Location: [facility] ENDOSCOPY;  Service: Endoscopy;  Laterality: N/A;   ESOPHAGOGASTRODUODENOSCOPY (EGD) WITH PROPOFOL N/A 03/18/2019   Procedure: ESOPHAGOGASTRODUODENOSCOPY (EGD) WITH PROPOFOL;  Surgeon: Virgel Manifold, MD;  Location: [facility] ENDOSCOPY;  Service: Endoscopy;  Laterality: N/A;   right ankle orif      Prior to Admission medications   Medication Sig Start Date End Date Taking? Authorizing Provider  aspirin EC 81 MG tablet Take 81 mg by mouth daily.    [provider]  bisacodyl (BISACODYL) 5 MG EC tablet Take 2 tablets ('10mg'$ ) by mouth the day before your procedure between 1pm-3pm. 03/18/19   Virgel Manifold, MD  Calcium Carbonate-Vitamin D (CALCIUM 500 + D) 500-125 MG-UNIT TABS Take by mouth.    [provider]  docusate sodium (COLACE) 100 MG capsule Take 100 mg by mouth daily.     [provider]  furosemide (LASIX) 20 MG tablet Take 20 mg by mouth 2 (two) times daily.    [provider]  gabapentin (NEURONTIN) 800 MG tablet Take by mouth. 12/20/13   [provider]  hydrochlorothiazide (HYDRODIURIL) 25 MG tablet Take by mouth.    [provider]  hydrOXYzine (ATARAX/VISTARIL) 25 MG tablet Take 25 mg by mouth daily. Take '25mg'$ . By mouth every morning    [provider]  losartan (COZAAR) 100 MG tablet Take 100 mg by mouth daily.    [provider]  metFORMIN (GLUCOPHAGE) 500 MG tablet Take 500 mg by mouth 2 (two) times daily with a meal.    [provider]  oxybutynin (DITROPAN-XL) 10 MG 24 hr tablet Take 10 mg by mouth at bedtime.    [provider]  potassium chloride (K-DUR) 10 MEQ tablet Take by mouth. 10/27/15   [provider]  pregabalin (LYRICA) 75 MG capsule Take 75 mg by mouth daily.    [provider]  sertraline (ZOLOFT) 25 MG tablet Take by mouth. 10/27/15   [provider]  simvastatin (ZOCOR) 20 MG tablet Take 20 mg by mouth daily.    [provider]    Allergies Ace inhibitors and Cyclobenzaprine  Family History  Problem Relation Age of Onset   Breast cancer Maternal Aunt     Social History Social History   Tobacco Use   Smoking status: Every Day    Packs/day: 0.10    Types: Cigarettes   Smokeless tobacco: Never    Review of Systems Constitutional: No fever/chills Eyes: Visual changes at the time of syncopal episode with headache. ENT: No sore throat. Cardiovascular: Positive for syncopal episode.  Denies chest pain.   Respiratory: Denies shortness of breath. Gastrointestinal: No abdominal pain.  No nausea, no vomiting.  No diarrhea.  No constipation. Genitourinary: Negative for dysuria. Musculoskeletal: Negative for neck pain.  Negative for back pain. Integumentary: Negative for rash. Neurological: Acute onset and severe headache,  now improved but still residual, no focal numbness nor weakness but with syncope.   ____________________________________________   PHYSICAL EXAM:  VITAL SIGNS: ED Triage Vitals [11/17/20 1933]  Enc Vitals Group     BP (!) 118/98     Pulse Rate (!) 101     Resp 18     Temp 98.5 F (36.9 C)     Temp Source Oral     SpO2 94 %     Weight 81.6 kg (180 lb)     Height 1.549 m ('5\' 1"'$ )     Head Circumference      Peak Flow      Pain Score 0     Pain Loc      Pain Edu?      Excl. in [place]?     Constitutional: Alert and oriented.  Eyes: Conjunctivae are normal.  Pupils are equal and reactive. Head: Atraumatic. Nose: No congestion/rhinnorhea. Mouth/Throat: Patient is wearing a mask. Neck: No stridor.  No meningeal signs.  Cardiovascular: Normal rate, regular rhythm. Good peripheral circulation. Respiratory: Normal respiratory effort.  No retractions. Gastrointestinal: Soft and nontender. No distention.  Musculoskeletal: No lower extremity tenderness nor edema. No gross deformities of extremities. Neurologic:  Normal speech and language. No gross focal neurologic deficits are appreciated.  Skin:  Skin is warm, dry and intact. Psychiatric: Mood and affect are normal. Speech and behavior are normal.  ____________________________________________   LABS (all labs ordered are listed, but only abnormal results are displayed)  Labs Reviewed  BASIC METABOLIC PANEL - Abnormal; Notable for the following components:      Result Value   Glucose, Bld 115 (*)    Creatinine, Ser 1.05 (*)    Calcium 8.6 (*)    GFR, Estimated 56 (*)    All other components within normal limits  CBC - Abnormal; Notable for the following components:   WBC 13.8 (*)    RBC 3.52 (*)    Hemoglobin 11.4 (*)    HCT 33.9 (*)    RDW 25.2 (*)    Platelets 498 (*)    All other components within normal limits  URINALYSIS, COMPLETE (UACMP) WITH MICROSCOPIC    ____________________________________________  EKG  ED ECG REPORT I, Hinda Kehr, the attending physician, personally viewed and interpreted this ECG.  Date: 11/17/2020 EKG Time: 19:36 Rate: 97 Rhythm: normal sinus rhythm QRS Axis: normal Intervals: normal ST/T Wave abnormalities: normal Narrative Interpretation: no evidence of acute ischemia  ____________________________________________  RADIOLOGY  CTA head (w and wo contrast) pending at time of transfer of care. ____________________________________________   INITIAL IMPRESSION / MDM / [place] / ED COURSE  As part of my medical decision making, I reviewed the following data within the [place] notes reviewed and incorporated, Labs reviewed , EKG interpreted , Old chart reviewed, Patient signed out to Dr. Ellender Hose, and reviewed Notes from prior ED visits   Differential diagnosis includes, but is not limited to, vasovagal episode, orthostatic hypotension, cardiac arrhythmia, nontraumatic intracranial bleed such as a subarachnoid hemorrhage from an aneurysm.  Patient is nearly 36 hours out from the acute episode.  She has been waiting for about 11 hours in this emergency department to be evaluated.  CT noncontrast of little utility, but the combination of a noncontrast head CT with CTA with IV contrast should be helpful in improving or at least ruling out that the patient does not have an aneurysm and a bleed.  She has not had any additional episodes in 36 hours that she does have a mild persistent headache but without any focal neurological deficits on exam.  Nonischemic EKG.  Normal vital signs nonspecific leukocytosis of 13.8 but without any infectious history or signs/symptoms.  Basic metabolic panel is essentially normal.  Patient does not meet criteria for low risk syncope based on [place] syncope rules because of her history of CHF but she is not having any evidence of acute issue  at this time.  I talked with her and explained the need for a CT/CTA.  She is displeased because of the length of weight and her family member having to wait for her, but I explained that it is in her best interest to obtain this reassuring result and that if it is normal she can go home afterwards.   She seems to understand and agree with the plan at this time but she is still frustrated understandably due to her long wait.  Transferring ED care to Dr. Ellender Hose to follow-up on  the imaging and discharge or admit as appropriate.           ____________________________________________  FINAL CLINICAL IMPRESSION(S) / ED DIAGNOSES  Final diagnoses:  Syncope and collapse  Acute nonintractable headache, unspecified headache type     MEDICATIONS GIVEN DURING THIS VISIT:  Medications  acetaminophen (TYLENOL) tablet 650 mg (650 mg Oral Given 11/18/20 0318)     ED Discharge Orders     None        Note:  This document was prepared using Dragon voice recognition software and may include unintentional dictation errors.   Hinda Kehr, MD 11/18/20 [phone]

## 2020-12-01 ENCOUNTER — Encounter: Payer: Self-pay | Admitting: Emergency Medicine

## 2020-12-01 ENCOUNTER — Inpatient Hospital Stay: Payer: Medicare Other | Admitting: Anesthesiology

## 2020-12-01 ENCOUNTER — Encounter
Admission: EM | Disposition: A | Discharge status: Home / Self Care | Payer: Self-pay | Source: Home / Self Care | Attending: Obstetrics and Gynecology

## 2020-12-01 ENCOUNTER — Inpatient Hospital Stay
Admission: EM | Admit: 2020-12-01 | Discharge: 2020-12-05 | DRG: 744 | Disposition: A | Discharge status: Home / Self Care | Payer: Medicare Other | Attending: Obstetrics and Gynecology | Admitting: Obstetrics and Gynecology

## 2020-12-01 ENCOUNTER — Emergency Department: Payer: Medicare Other

## 2020-12-01 ENCOUNTER — Other Ambulatory Visit: Payer: Self-pay

## 2020-12-01 DIAGNOSIS — I5032 Chronic diastolic (congestive) heart failure: Secondary | ICD-10-CM | POA: Diagnosis present

## 2020-12-01 DIAGNOSIS — A419 Sepsis, unspecified organism: Secondary | ICD-10-CM | POA: Diagnosis present

## 2020-12-01 DIAGNOSIS — I110 Hypertensive heart disease with heart failure: Secondary | ICD-10-CM | POA: Diagnosis present

## 2020-12-01 DIAGNOSIS — K5730 Diverticulosis of large intestine without perforation or abscess without bleeding: Secondary | ICD-10-CM | POA: Diagnosis present

## 2020-12-01 DIAGNOSIS — N390 Urinary tract infection, site not specified: Secondary | ICD-10-CM | POA: Diagnosis present

## 2020-12-01 DIAGNOSIS — E11649 Type 2 diabetes mellitus with hypoglycemia without coma: Secondary | ICD-10-CM | POA: Diagnosis present

## 2020-12-01 DIAGNOSIS — N83209 Unspecified ovarian cyst, unspecified side: Secondary | ICD-10-CM

## 2020-12-01 DIAGNOSIS — F1490 Cocaine use, unspecified, uncomplicated: Secondary | ICD-10-CM | POA: Diagnosis present

## 2020-12-01 DIAGNOSIS — I959 Hypotension, unspecified: Secondary | ICD-10-CM | POA: Diagnosis present

## 2020-12-01 DIAGNOSIS — N838 Other noninflammatory disorders of ovary, fallopian tube and broad ligament: Secondary | ICD-10-CM | POA: Diagnosis present

## 2020-12-01 DIAGNOSIS — R55 Syncope and collapse: Secondary | ICD-10-CM | POA: Diagnosis present

## 2020-12-01 DIAGNOSIS — F17210 Nicotine dependence, cigarettes, uncomplicated: Secondary | ICD-10-CM | POA: Diagnosis present

## 2020-12-01 DIAGNOSIS — R102 Pelvic and perineal pain: Secondary | ICD-10-CM | POA: Diagnosis present

## 2020-12-01 DIAGNOSIS — Z20822 Contact with and (suspected) exposure to covid-19: Secondary | ICD-10-CM | POA: Diagnosis present

## 2020-12-01 DIAGNOSIS — Z7982 Long term (current) use of aspirin: Secondary | ICD-10-CM

## 2020-12-01 DIAGNOSIS — F32A Depression, unspecified: Secondary | ICD-10-CM | POA: Diagnosis present

## 2020-12-01 DIAGNOSIS — R1900 Intra-abdominal and pelvic swelling, mass and lump, unspecified site: Secondary | ICD-10-CM

## 2020-12-01 DIAGNOSIS — C539 Malignant neoplasm of cervix uteri, unspecified: Secondary | ICD-10-CM | POA: Diagnosis present

## 2020-12-01 DIAGNOSIS — I10 Essential (primary) hypertension: Secondary | ICD-10-CM | POA: Diagnosis present

## 2020-12-01 DIAGNOSIS — D649 Anemia, unspecified: Secondary | ICD-10-CM | POA: Diagnosis present

## 2020-12-01 DIAGNOSIS — N83201 Unspecified ovarian cyst, right side: Secondary | ICD-10-CM | POA: Diagnosis present

## 2020-12-01 DIAGNOSIS — R1030 Lower abdominal pain, unspecified: Secondary | ICD-10-CM | POA: Diagnosis present

## 2020-12-01 DIAGNOSIS — E119 Type 2 diabetes mellitus without complications: Secondary | ICD-10-CM | POA: Diagnosis not present

## 2020-12-01 DIAGNOSIS — Z7984 Long term (current) use of oral hypoglycemic drugs: Secondary | ICD-10-CM

## 2020-12-01 DIAGNOSIS — E118 Type 2 diabetes mellitus with unspecified complications: Secondary | ICD-10-CM

## 2020-12-01 DIAGNOSIS — I700 Atherosclerosis of aorta: Secondary | ICD-10-CM | POA: Diagnosis present

## 2020-12-01 HISTORY — PX: DILATION AND CURETTAGE OF UTERUS: SHX78

## 2020-12-01 LAB — URINALYSIS, COMPLETE (UACMP) WITH MICROSCOPIC
Bilirubin Urine: NEGATIVE
Glucose, UA: NEGATIVE mg/dL
Ketones, ur: NEGATIVE mg/dL
Nitrite: NEGATIVE
Protein, ur: NEGATIVE mg/dL
Specific Gravity, Urine: 1.012 (ref 1.005–1.030)
WBC, UA: >50 WBC/hpf — ABNORMAL HIGH (ref 0–5)
pH: 5 (ref 5.0–8.0)

## 2020-12-01 LAB — PROTIME-INR
INR: 1.1 (ref 0.8–1.2)
Prothrombin Time: 14.6 seconds (ref 11.4–15.2)

## 2020-12-01 LAB — BASIC METABOLIC PANEL
Anion gap: 9 (ref 5–15)
BUN: 22 mg/dL (ref 8–23)
CO2: 24 mmol/L (ref 22–32)
Calcium: 8.3 mg/dL — ABNORMAL LOW (ref 8.9–10.3)
Chloride: 103 mmol/L (ref 98–111)
Creatinine, Ser: 1.24 mg/dL — ABNORMAL HIGH (ref 0.44–1.00)
GFR, Estimated: 46 mL/min — ABNORMAL LOW (ref >60)
Glucose, Bld: 73 mg/dL (ref 70–99)
Potassium: 5.1 mmol/L (ref 3.5–5.1)
Sodium: 136 mmol/L (ref 135–145)

## 2020-12-01 LAB — CBC
HCT: 31.3 % — ABNORMAL LOW (ref 36.0–46.0)
Hemoglobin: 11 g/dL — ABNORMAL LOW (ref 12.0–15.0)
MCH: 34.7 pg — ABNORMAL HIGH (ref 26.0–34.0)
MCHC: 35.1 g/dL (ref 30.0–36.0)
MCV: 98.7 fL (ref 80.0–100.0)
Platelets: 426 10*3/uL — ABNORMAL HIGH (ref 150–400)
RBC: 3.17 MIL/uL — ABNORMAL LOW (ref 3.87–5.11)
RDW: 21.7 % — ABNORMAL HIGH (ref 11.5–15.5)
WBC: 23.3 10*3/uL — ABNORMAL HIGH (ref 4.0–10.5)
nRBC: 0 % (ref 0.0–0.2)

## 2020-12-01 LAB — CBG MONITORING, ED
Glucose-Capillary: 76 mg/dL (ref 70–99)
Glucose-Capillary: 80 mg/dL (ref 70–99)
Glucose-Capillary: 79 mg/dL (ref 70–99)

## 2020-12-01 LAB — LACTIC ACID, PLASMA
Lactic Acid, Venous: 1.6 mmol/L (ref 0.5–1.9)
Lactic Acid, Venous: 1.4 mmol/L (ref 0.5–1.9)

## 2020-12-01 LAB — TYPE AND SCREEN
ABO/RH(D): A POS
Antibody Screen: NEGATIVE

## 2020-12-01 LAB — APTT: aPTT: 39 seconds — ABNORMAL HIGH (ref 24–36)

## 2020-12-01 LAB — PROCALCITONIN: Procalcitonin: 1.58 ng/mL

## 2020-12-01 LAB — RESP PANEL BY RT-PCR (FLU A&B, COVID) ARPGX2
Influenza A by PCR: NEGATIVE
Influenza B by PCR: NEGATIVE
SARS Coronavirus 2 by RT PCR: NEGATIVE

## 2020-12-01 LAB — LIPASE, BLOOD: Lipase: 37 U/L (ref 11–51)

## 2020-12-01 LAB — ABO/RH: ABO/RH(D): A POS

## 2020-12-01 LAB — BRAIN NATRIURETIC PEPTIDE: B Natriuretic Peptide: 64.8 pg/mL (ref 0.0–100.0)

## 2020-12-01 LAB — TROPONIN I (HIGH SENSITIVITY): Troponin I (High Sensitivity): 17 ng/L (ref <18)

## 2020-12-01 LAB — GLUCOSE, CAPILLARY: Glucose-Capillary: 167 mg/dL — ABNORMAL HIGH (ref 70–99)

## 2020-12-01 SURGERY — DILATION AND CURETTAGE
Anesthesia: General

## 2020-12-01 MED ORDER — DOCUSATE SODIUM 100 MG PO CAPS
100.0000 mg | ORAL_CAPSULE | Freq: Two times a day (BID) | ORAL | Status: DC
  Administered 2020-12-01 – 2020-12-05 (×8): 100 mg via ORAL
  Filled 2020-12-01 (×8): qty 1

## 2020-12-01 MED ORDER — CLINDAMYCIN PHOSPHATE 900 MG/50ML IV SOLN
900.0000 mg | Freq: Three times a day (TID) | INTRAVENOUS | Status: DC
  Administered 2020-12-01 – 2020-12-03 (×5): 900 mg via INTRAVENOUS
  Filled 2020-12-01 (×7): qty 50

## 2020-12-01 MED ORDER — ONDANSETRON HCL 4 MG/2ML IJ SOLN
INTRAMUSCULAR | Status: AC
  Filled 2020-12-01: qty 2

## 2020-12-01 MED ORDER — ONDANSETRON HCL 4 MG/2ML IJ SOLN: 4.0000 mg | Freq: Four times a day (QID) | INTRAMUSCULAR | Status: DC | PRN

## 2020-12-01 MED ORDER — OXYBUTYNIN CHLORIDE ER 10 MG PO TB24
10.0000 mg | ORAL_TABLET | Freq: Every day | ORAL | Status: DC
  Administered 2020-12-01 – 2020-12-04 (×4): 10 mg via ORAL
  Filled 2020-12-01 (×5): qty 1

## 2020-12-01 MED ORDER — PHENYLEPHRINE HCL (PRESSORS) 10 MG/ML IV SOLN
INTRAVENOUS | Status: DC | PRN
  Administered 2020-12-01: 100 ug via INTRAVENOUS
  Administered 2020-12-01: 50 ug via INTRAVENOUS
  Administered 2020-12-01: 100 ug via INTRAVENOUS

## 2020-12-01 MED ORDER — SERTRALINE HCL 50 MG PO TABS
25.0000 mg | ORAL_TABLET | Freq: Every day | ORAL | Status: DC
  Administered 2020-12-02 – 2020-12-05 (×4): 25 mg via ORAL
  Filled 2020-12-01 (×3): qty 1
  Filled 2020-12-01 (×2): qty 0.5
  Filled 2020-12-01: qty 1
  Filled 2020-12-01 (×2): qty 0.5

## 2020-12-01 MED ORDER — LACTATED RINGERS IV SOLN: INTRAVENOUS | Status: AC

## 2020-12-01 MED ORDER — ACETAMINOPHEN 500 MG PO TABS
1000.0000 mg | ORAL_TABLET | Freq: Four times a day (QID) | ORAL | Status: DC | PRN
  Administered 2020-12-02 – 2020-12-05 (×7): 1000 mg via ORAL
  Filled 2020-12-01 (×9): qty 2

## 2020-12-01 MED ORDER — FENTANYL CITRATE (PF) 100 MCG/2ML IJ SOLN
INTRAMUSCULAR | Status: AC
  Filled 2020-12-01: qty 2

## 2020-12-01 MED ORDER — PREGABALIN 75 MG PO CAPS
75.0000 mg | ORAL_CAPSULE | Freq: Every day | ORAL | Status: DC
  Administered 2020-12-02 – 2020-12-05 (×4): 75 mg via ORAL
  Filled 2020-12-01 (×2): qty 1
  Filled 2020-12-01: qty 3
  Filled 2020-12-01 (×3): qty 1

## 2020-12-01 MED ORDER — SILVER NITRATE-POT NITRATE 75-25 % EX MISC
CUTANEOUS | Status: AC
  Filled 2020-12-01: qty 10

## 2020-12-01 MED ORDER — FLEET ENEMA 7-19 GM/118ML RE ENEM
1.0000 | ENEMA | Freq: Every day | RECTAL | Status: DC | PRN
  Administered 2020-12-03: 1 via RECTAL

## 2020-12-01 MED ORDER — ONDANSETRON HCL 4 MG/2ML IJ SOLN
INTRAMUSCULAR | Status: DC | PRN
  Administered 2020-12-01: 4 mg via INTRAVENOUS

## 2020-12-01 MED ORDER — FENTANYL CITRATE (PF) 100 MCG/2ML IJ SOLN: 25.0000 ug | INTRAMUSCULAR | Status: DC | PRN

## 2020-12-01 MED ORDER — IOHEXOL 350 MG/ML SOLN
80.0000 mL | Freq: Once | INTRAVENOUS | Status: AC | PRN
  Administered 2020-12-01: 80 mL via INTRAVENOUS
  Filled 2020-12-01: qty 80

## 2020-12-01 MED ORDER — DEXAMETHASONE SODIUM PHOSPHATE 10 MG/ML IJ SOLN
INTRAMUSCULAR | Status: AC
  Filled 2020-12-01: qty 1

## 2020-12-01 MED ORDER — MORPHINE SULFATE (PF) 2 MG/ML IV SOLN
2.0000 mg | Freq: Once | INTRAVENOUS | Status: DC
Start: 2020-12-01 — End: 2020-12-05

## 2020-12-01 MED ORDER — LACTATED RINGERS IV BOLUS
750.0000 mL | Freq: Once | INTRAVENOUS | Status: AC
  Administered 2020-12-01: 750 mL via INTRAVENOUS

## 2020-12-01 MED ORDER — OXYCODONE HCL 5 MG PO TABS
5.0000 mg | ORAL_TABLET | ORAL | Status: DC | PRN
Start: 2020-12-01 — End: 2020-12-03
  Administered 2020-12-01 – 2020-12-03 (×4): 5 mg via ORAL
  Filled 2020-12-01 (×3): qty 1

## 2020-12-01 MED ORDER — FENTANYL CITRATE (PF) 100 MCG/2ML IJ SOLN
INTRAMUSCULAR | Status: DC | PRN
  Administered 2020-12-01 (×2): 50 ug via INTRAVENOUS

## 2020-12-01 MED ORDER — PROPOFOL 10 MG/ML IV BOLUS
INTRAVENOUS | Status: DC | PRN
  Administered 2020-12-01: 110 mg via INTRAVENOUS
  Administered 2020-12-01: 40 mg via INTRAVENOUS

## 2020-12-01 MED ORDER — ONDANSETRON HCL 4 MG PO TABS: 4.0000 mg | ORAL_TABLET | Freq: Four times a day (QID) | ORAL | Status: DC | PRN

## 2020-12-01 MED ORDER — ROCURONIUM BROMIDE 10 MG/ML (PF) SYRINGE
PREFILLED_SYRINGE | INTRAVENOUS | Status: AC
  Filled 2020-12-01: qty 10

## 2020-12-01 MED ORDER — SIMVASTATIN 20 MG PO TABS
20.0000 mg | ORAL_TABLET | Freq: Every evening | ORAL | Status: DC
  Administered 2020-12-01 – 2020-12-04 (×4): 20 mg via ORAL
  Filled 2020-12-01 (×5): qty 1

## 2020-12-01 MED ORDER — FERRIC SUBSULFATE 259 MG/GM EX SOLN
CUTANEOUS | Status: AC
  Filled 2020-12-01: qty 8

## 2020-12-01 MED ORDER — GENTAMICIN SULFATE 40 MG/ML IJ SOLN
5.0000 mg/kg | Freq: Once | INTRAVENOUS | Status: AC
  Administered 2020-12-01: 280 mg via INTRAVENOUS
  Filled 2020-12-01: qty 7

## 2020-12-01 MED ORDER — LIDOCAINE HCL (CARDIAC) PF 100 MG/5ML IV SOSY
PREFILLED_SYRINGE | INTRAVENOUS | Status: DC | PRN
  Administered 2020-12-01: 30 mg via INTRAVENOUS

## 2020-12-01 MED ORDER — PRENATAL MULTIVITAMIN CH
1.0000 | ORAL_TABLET | Freq: Every day | ORAL | Status: DC
  Administered 2020-12-05: 1 via ORAL
  Filled 2020-12-01: qty 1

## 2020-12-01 MED ORDER — SUCCINYLCHOLINE CHLORIDE 200 MG/10ML IV SOSY
PREFILLED_SYRINGE | INTRAVENOUS | Status: AC
  Filled 2020-12-01: qty 10

## 2020-12-01 MED ORDER — SODIUM CHLORIDE 0.9 % IV SOLN
2.0000 g | Freq: Once | INTRAVENOUS | Status: AC
  Administered 2020-12-01: 2 g via INTRAVENOUS
  Filled 2020-12-01: qty 2

## 2020-12-01 MED ORDER — DEXMEDETOMIDINE (PRECEDEX) IN NS 20 MCG/5ML (4 MCG/ML) IV SYRINGE
PREFILLED_SYRINGE | INTRAVENOUS | Status: DC | PRN
Start: 2020-12-01 — End: 2020-12-01
  Administered 2020-12-01: 8 ug via INTRAVENOUS

## 2020-12-01 MED ORDER — DEXAMETHASONE SODIUM PHOSPHATE 10 MG/ML IJ SOLN
INTRAMUSCULAR | Status: DC | PRN
  Administered 2020-12-01: 5 mg via INTRAVENOUS

## 2020-12-01 MED ORDER — HYDROXYZINE HCL 25 MG PO TABS
25.0000 mg | ORAL_TABLET | Freq: Every day | ORAL | Status: DC
  Administered 2020-12-02 – 2020-12-05 (×4): 25 mg via ORAL
  Filled 2020-12-01 (×4): qty 1

## 2020-12-01 MED ORDER — INSULIN ASPART 100 UNIT/ML IJ SOLN
0.0000 [IU] | INTRAMUSCULAR | Status: DC
  Administered 2020-12-01: 3 [IU] via SUBCUTANEOUS
  Filled 2020-12-01: qty 1

## 2020-12-01 MED ORDER — OXYCODONE HCL 5 MG PO TABS
ORAL_TABLET | ORAL | Status: AC
  Filled 2020-12-01: qty 1

## 2020-12-01 MED ORDER — FERRIC SUBSULFATE 259 MG/GM EX SOLN
CUTANEOUS | Status: DC | PRN
  Administered 2020-12-01: 1

## 2020-12-01 MED ORDER — SUCCINYLCHOLINE CHLORIDE 200 MG/10ML IV SOSY
PREFILLED_SYRINGE | INTRAVENOUS | Status: DC | PRN
  Administered 2020-12-01: 50 mg via INTRAVENOUS

## 2020-12-01 MED ORDER — MORPHINE SULFATE (PF) 2 MG/ML IV SOLN: 1.0000 mg | INTRAVENOUS | Status: DC | PRN

## 2020-12-01 MED ORDER — SODIUM CHLORIDE 0.9 % IV SOLN: INTRAVENOUS | Status: DC

## 2020-12-01 MED ORDER — DOCUSATE SODIUM 100 MG PO CAPS: 100.0000 mg | ORAL_CAPSULE | Freq: Every day | ORAL | Status: DC

## 2020-12-01 SURGICAL SUPPLY — 17 items
BACTOSHIELD CHG 4% 4OZ (MISCELLANEOUS) ×1
CATH ROBINSON RED A/P 16FR (CATHETERS) ×2 IMPLANT
DRSG TELFA 3X8 NADH (GAUZE/BANDAGES/DRESSINGS) ×2 IMPLANT
DRSG TELFA 4X3 1S NADH ST (GAUZE/BANDAGES/DRESSINGS) ×2 IMPLANT
GAUZE 4X4 16PLY ~~LOC~~+RFID DBL (SPONGE) ×2 IMPLANT
GLOVE SURG SYN 8.0 (GLOVE) ×2 IMPLANT
GOWN STRL REUS W/ TWL LRG LVL3 (GOWN DISPOSABLE) ×1 IMPLANT
GOWN STRL REUS W/ TWL XL LVL3 (GOWN DISPOSABLE) ×1 IMPLANT
GOWN STRL REUS W/TWL LRG LVL3 (GOWN DISPOSABLE) ×1
GOWN STRL REUS W/TWL XL LVL3 (GOWN DISPOSABLE) ×1
KIT TURNOVER CYSTO (KITS) ×2 IMPLANT
MANIFOLD NEPTUNE II (INSTRUMENTS) ×2 IMPLANT
PACK DNC HYST (MISCELLANEOUS) ×2 IMPLANT
PAD OB MATERNITY 4.3X12.25 (PERSONAL CARE ITEMS) ×2 IMPLANT
PAD PREP 24X41 OB/GYN DISP (PERSONAL CARE ITEMS) ×2 IMPLANT
SCRUB CHG 4% DYNA-HEX 4OZ (MISCELLANEOUS) ×1 IMPLANT
TOWEL OR 17X26 4PK STRL BLUE (TOWEL DISPOSABLE) ×2 IMPLANT

## 2020-12-01 NOTE — Consult Note (Addendum)
[facility] Medical Consultation  Rebecca Parker [id number] DOB: 1950/11/21 DOA: 12/01/2020 PCP: [facility] [place]   Requesting physician: Dr Ouida Sills Date of consultation: 12/01/20 Reason for consultation: Management of medical problems which include diabetes mellitus, chronic diastolic dysfunction CHF  Impression/Recommendations Principal Problem:   Sepsis ([place]) Active Problems:   Benign essential hypertension   Chronic diastolic CHF (congestive heart failure), NYHA class 2 (HCC)   Type 2 diabetes mellitus without complications (H[facility])   Ovarian mass, right   Acute lower UTI    Sepsis Initially presumed to be from a urinary source but patient is noted to have purulent vaginal discharge and imaging shows air within the fundal endometrial cavity. She has no urinary symptoms despite having pyuria. Patient was hypotensive upon arrival with SBP in the 70's and responded to IVF resuscitation. She also has marked leukocytosis with a left shift. Patient has been seen by gynecologist and will be taken to the OR for D&C as well as biopsy. She received empiric antibiotic therapy with cefepime in the ER as well as IV fluid resuscitation. Will defer to GYN choice of antibiotic therapy Continue aggressive IV fluid resuscitation    2.  Hypertension Patient noted to be hypotensive upon arrival to the ER Will hold all antihypertensive medications for now which include hydrochlorothiazide and Cozaar   3.  Diabetes mellitus Hold metformin Glycemic control with sliding scale insulin Maintain consistent carbohydrate diet    4.  Ovarian mass Patient noted to have a thickly septated right ovarian mass measuring 5.9 x 5.1 cm.  Findings are concerning for ovarian neoplasm Further work-up per gynecology   5.  Chronic diastolic dysfunction CHF Not acutely exacerbated Hold furosemide and Cozaar   6.  Depression Continue sertraline   I will followup again  tomorrow. Please contact me if I can be of assistance in the meanwhile. Thank you for this consultation.  Chief Complaint: Abdominal pain  HPI:  Rebecca Parker is a 70 y.o. female with medical history significant for diabetes mellitus and chronic diastolic dysfunction CHF who presents to the ER from her primary care provider's office for evaluation of abdominal pain and hypotension. Patient complains of diffuse abdominal pain which started on the day of mostly in the lower abdominal area.  She describes the pain as bandlike extending from right lower quadrant to the left lower quadrant.  She rates her pain a 10 x 10 in intensity at its worst and radiating.  Abdominal pain is associated with constipation but she denies having any nausea or vomiting.  She was seen at her doctor's office and was noted to be hypotensive with systolic blood pressure in the 70s.  She was sent to the ER via EMS for further evaluation. She stated that her appetite has been good but she has lost a significant amount of weight. She denies having any fever, no chills, no cough, she denies having any dysuria, no nocturia, no hematuria, no headache, no palpitations, no chest pain, no shortness of breath, no focal deficits or blurred vision. Labs show sodium 136, potassium 5.1, chloride 103, bicarb 24, glucose 73, BUN 22, creatinine 1.24, calcium 8.3, troponin 17, lactic acid 1.6, white count 23.3, hemoglobin 11.0, hematocrit 31.3, MCV 98.7, RDW 21.7, platelet count 426, PT 14.6, INR 1.1 Respiratory viral panel is negative Urine analysis shows pyuria Chest x-ray reviewed by me shows cardiomegaly without acute cardiopulmonary disease CT scan of abdomen and pelvis with contrast shows there is a new, thickly septated right  ovarian mass measuring 5.1 x 5.1 cm. 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 Recommend initial pelvic ultrasound and likely subsequent MRI to further evaluate, which may be performed on a nonemergent  basis. Air within the fundal endometrial cavity. Correlate for recent instrumentation. Pan colonic diverticulosis without evidence of acute diverticulitis. Aortic Atherosclerosis  Twelve-lead EKG reviewed by me shows sinus rhythm   Review of Systems: As per HPI otherwise all other systems reviewed and negative.    Past Medical History:  Diagnosis Date   Chest pain, unspecified    CHF (congestive heart failure) ([place])    Diabetes mellitus without complication (Rebecca Parker)    Hip pain    Hypertension    Multinodular goiter    Past Surgical History:  Procedure Laterality Date   COLONOSCOPY WITH PROPOFOL N/A 11/24/2015   Procedure: COLONOSCOPY WITH PROPOFOL;  Surgeon: Lollie Sails, MD;  [facility] ENDOSCOPY;  Service: Endoscopy;  Laterality: N/A;   COLONOSCOPY WITH PROPOFOL N/A 03/18/2019   Procedure: COLONOSCOPY WITH PROPOFOL;  Surgeon: Virgel Manifold, MD;  Location: [facility] ENDOSCOPY;  Service: Endoscopy;  Laterality: N/A;   ESOPHAGOGASTRODUODENOSCOPY (EGD) WITH PROPOFOL N/A 03/18/2019   Procedure: ESOPHAGOGASTRODUODENOSCOPY (EGD) WITH PROPOFOL;  Surgeon: Virgel Manifold, MD;  Location: [facility] ENDOSCOPY;  Service: Endoscopy;  Laterality: N/A;   right ankle orif     Social History:  reports that she has been smoking cigarettes. She has been smoking an average of .1 packs per day. She has never used smokeless tobacco. No history on file for alcohol use and drug use.  Allergies  Allergen Reactions   Ace Inhibitors Itching    Rash   Cyclobenzaprine Itching    Rash and 'made me nervous'   Family History  Problem Relation Age of Onset   Breast cancer Maternal Aunt     Prior to Admission medications   Medication Sig Start Date End Date Taking? Authorizing Provider  aspirin EC 81 MG tablet Take 81 mg by mouth daily.    [provider]  bisacodyl (BISACODYL) 5 MG EC tablet Take 2 tablets ('10mg'$ ) by mouth the day before your procedure between 1pm-3pm. 03/18/19    Virgel Manifold, MD  Calcium Carbonate-Vitamin D (CALCIUM 500 + D) 500-125 MG-UNIT TABS Take by mouth.    [provider]  docusate sodium (COLACE) 100 MG capsule Take 100 mg by mouth daily.    [provider]  furosemide (LASIX) 20 MG tablet Take 20 mg by mouth 2 (two) times daily.    [provider]  gabapentin (NEURONTIN) 800 MG tablet Take by mouth. 12/20/13   [provider]  hydrochlorothiazide (HYDRODIURIL) 25 MG tablet Take by mouth.    [provider]  hydrOXYzine (ATARAX/VISTARIL) 25 MG tablet Take 25 mg by mouth daily. Take '25mg'$ . By mouth every morning    [provider]  losartan (COZAAR) 100 MG tablet Take 100 mg by mouth daily.    [provider]  metFORMIN (GLUCOPHAGE) 500 MG tablet Take 500 mg by mouth 2 (two) times daily with a meal.    [provider]  oxybutynin (DITROPAN-XL) 10 MG 24 hr tablet Take 10 mg by mouth at bedtime.    [provider]  potassium chloride (K-DUR) 10 MEQ tablet Take by mouth. 10/27/15   [provider]  pregabalin (LYRICA) 75 MG capsule Take 75 mg by mouth daily.    [provider]  sertraline (ZOLOFT) 25 MG tablet Take by mouth. 10/27/15   [provider]  simvastatin (ZOCOR) 20 MG tablet Take 20 mg by mouth daily.    [provider]   Physical Exam: Blood pressure 104/71, pulse 83, temperature 98.5 F (36.9 C), temperature source Oral, resp. rate 13, height 5' 1.5" (1.562 m), weight 67.6 kg, SpO2 100 %. Vitals:   12/01/20 1400 12/01/20 1430  BP: 100/64 104/71  Pulse:  83  Resp: 13 13  Temp:    SpO2:  100%    General: Chronically ill-appearing Eyes: Pale conjunctiva ENT: Within normal limits Neck: Supple, no JVD Cardiovascular: RRR S1,S2 Respiratory: Clear to auscultation bilaterally Abdomen: Bowel sounds are present, diffusely tender in the lower abdomen (right lower quadrant, suprapubic and left lower quadrant),  nondistended Skin: Warm and dry Musculoskeletal: Normal range of motion Psychiatric: Flat affect Neurologic: Able to move all extremities, no focal deficits  Labs on Admission:  Basic Metabolic Panel: Recent Labs  Lab 12/01/20 1311  NA 136  K 5.1  CL 103  CO2 24  GLUCOSE 73  BUN 22  CREATININE 1.24*  CALCIUM 8.3*   Liver Function Tests: No results for input(s): AST, ALT, ALKPHOS, BILITOT, PROT, ALBUMIN in the last 168 hours. Recent Labs  Lab 12/01/20 1311  LIPASE 37   No results for input(s): AMMONIA in the last 168 hours. CBC: Recent Labs  Lab 12/01/20 1311  WBC 23.3*  HGB 11.0*  HCT 31.3*  MCV 98.7  PLT 426*   Cardiac Enzymes: No results for input(s): CKTOTAL, CKMB, CKMBINDEX, TROPONINI in the last 168 hours. BNP: Invalid input(s): POCBNP CBG: No results for input(s): GLUCAP in the last 168 hours.  Radiological Exams on Admission: CT ABDOMEN PELVIS W CONTRAST  Result Date: 12/01/2020 CLINICAL DATA:  Lower abdominal pain for 24 hours EXAM: CT ABDOMEN AND PELVIS WITH CONTRAST TECHNIQUE: Multidetector CT imaging of the abdomen and pelvis was performed using the standard protocol following bolus administration of intravenous contrast. CONTRAST:  24m OMNIPAQUE IOHEXOL 350 MG/ML SOLN COMPARISON:  11/04/2018 FINDINGS: Lower chest: No acute abnormality. Hepatobiliary: No solid liver abnormality is seen. No gallstones, gallbladder wall thickening, or biliary dilatation. Pancreas: Unremarkable. No pancreatic ductal dilatation or surrounding inflammatory changes. Spleen: Normal in size without significant abnormality. Adrenals/Urinary Tract: Adrenal glands are unremarkable. Kidneys are normal, without renal calculi, solid lesion, or hydronephrosis. Bladder is unremarkable. Stomach/Bowel: Stomach is within normal limits. Appendix appears normal. No evidence of bowel wall thickening, distention, or inflammatory changes. Pancolonic diverticulosis. Vascular/Lymphatic: Aortic  atherosclerosis. No enlarged abdominal or pelvic lymph nodes. Reproductive: Air within the fundal endometrial cavity (series 6, image 86). There is a new, thickly septated right ovarian mass measuring 5.1 x 5.1 cm (series 2, image 64). Other: No abdominal wall hernia or abnormality. No abdominopelvic ascites. Musculoskeletal: No acute or significant osseous findings. IMPRESSION: 1. There is a new, thickly septated right ovarian mass measuring 5.1 x 5.1 cm. 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 Recommend initial pelvic ultrasound and likely subsequent MRI to further evaluate, which may be performed on a nonemergent basis. 2. Air within the fundal endometrial cavity. Correlate for recent instrumentation. 3. Pancolonic diverticulosis without evidence of acute diverticulitis. Aortic Atherosclerosis (ICD10-I70.0). Electronically Signed   By: AEddie CandleM.D.   On: 12/01/2020 15:48   DG Chest Port 1 View  Result Date: 12/01/2020 CLINICAL DATA:  Question sepsis.  Short of breath. EXAM: PORTABLE CHEST 1 VIEW COMPARISON:  11/04/2018 FINDINGS: Cardiac enlargement. Negative for heart failure or edema. Lungs are clear without infiltrate or effusion. IMPRESSION: Cardiac enlargement without acute cardiopulmonary abnormality.  Electronically Signed   By: Franchot Gallo M.D.   On: 12/01/2020 12:52    EKG: Independently reviewed.  Normal sinus rhythm  Time spent: 65  [facility] Pager [phone]  If 7PM-7AM, please contact night-coverage [email or web address] Password [email or web address] 12/01/2020, 5:05 PM

## 2020-12-01 NOTE — ED Notes (Signed)
Pt transported to xray 

## 2020-12-01 NOTE — Sepsis Progress Note (Signed)
1800 To OR. Will follow once patient is placed in inpatient bed.

## 2020-12-01 NOTE — Sepsis Progress Note (Signed)
eLink is following this Code Sepsis. °

## 2020-12-01 NOTE — Transfer of Care (Signed)
Immediate Anesthesia Transfer of Care Note  Patient: Rebecca Parker  Procedure(s) Performed: DILATATION AND CURETTAGE  Patient Location: PACU  Anesthesia Type:General  Level of Consciousness: drowsy  Airway & Oxygen Therapy: Patient Spontanous Breathing and Patient connected to face mask oxygen  Post-op Assessment: Report given to RN and Post -op Vital signs reviewed and stable  Post vital signs: Reviewed  Last Vitals:  Vitals Value Taken Time  BP    Temp    Pulse    Resp    SpO2      Last Pain:  Vitals:   12/01/20 1753  TempSrc: Temporal  PainSc:          Complications: No notable events documented.

## 2020-12-01 NOTE — ED Notes (Signed)
Patient repositioned on stretcher and Purewick was placed.

## 2020-12-01 NOTE — ED Triage Notes (Signed)
Pt reports that she was at [facility] she states that for the last several days when she goes outside she gets SOB and is dizzy. They sent here here to be evaluated. She also report that she is having abd pain. Denies N/V/D

## 2020-12-01 NOTE — ED Triage Notes (Signed)
First Nurse Note:  Arrives via [facility] from [facility].  C/O abdominal pain.  VS wnl.

## 2020-12-01 NOTE — H&P (Signed)
Consult History and Physical  Admission History and physical  SERVICE: Gynecology [facility]  Patient Name: Rebecca Parker Patient MRN:   [id number]  CC: c/o lower abdominal pain 24 hours. Presyncopal as she was driving to her PCP today  HPI: Rebecca Parker is a 70 y.o. No obstetric history on file. with 24 hrs of pelvic pain . Denies Vaginal bleeding .  G3P3  ED consult to me for 5x5 cm right ovrian cyst with septation  and air in endometrial cavity . Pt admits to weight loss from 2340 . Appetite decreased and some early satiety  Pt denies dysuria    Review of Systems: positives in bold GEN:   fevers, chills, weight changes, appetite changes, fatigue, night sweats HEENT:  HA, vision changes, hearing loss, congestion, rhinorrhea, sinus pressure, dysphagia CV:   CP, palpitations PULM:  SOB, cough GI:  +abd pain, N/V/D/C GU:  dysuria, urgency, frequency MSK:  arthralgias, myalgias, back pain, swelling SKIN:  rashes, color changes, pallor NEURO:  numbness, weakness, tingling, seizures, dizziness, tremors PSYCH:  depression, anxiety, behavioral problems, confusion  HEME/LYMPH:  easy bruising or bleeding ENDO:  heat/cold intolerance  Past Obstetrical History: OB History   No obstetric history on file.    G3P3 svd x 3 Past Gynecologic History: Menopausal  Past Medical History: Past Medical History:  Diagnosis Date   Chest pain, unspecified    CHF (congestive heart failure) ([place])    Diabetes mellitus without complication ([place])    Hip pain    Hypertension    Multinodular goiter     Past Surgical History:   Past Surgical History:  Procedure Laterality Date   COLONOSCOPY WITH PROPOFOL N/A 11/24/2015   Procedure: COLONOSCOPY WITH PROPOFOL;  Surgeon: Lollie Sails, MD;  [facility] ENDOSCOPY;  Service: Endoscopy;  Laterality: N/A;   COLONOSCOPY WITH PROPOFOL N/A 03/18/2019   Procedure: COLONOSCOPY WITH PROPOFOL;  Surgeon: Virgel Manifold, MD;  Location: [facility]  ENDOSCOPY;  Service: Endoscopy;  Laterality: N/A;   ESOPHAGOGASTRODUODENOSCOPY (EGD) WITH PROPOFOL N/A 03/18/2019   Procedure: ESOPHAGOGASTRODUODENOSCOPY (EGD) WITH PROPOFOL;  Surgeon: Virgel Manifold, MD;  Location: [facility] ENDOSCOPY;  Service: Endoscopy;  Laterality: N/A;   right ankle orif      Family History:  family history includes Breast cancer in her maternal aunt.  Social History:  Social History   Socioeconomic History   Marital status: Legally Separated    Spouse name: Not on file   Number of children: Not on file   Years of education: Not on file   Highest education level: Not on file  Occupational History   Not on file  Tobacco Use   Smoking status: Every Day    Packs/day: 0.10    Types: Cigarettes   Smokeless tobacco: Never  Substance and Sexual Activity   Alcohol use: Not on file   Drug use: Not on file   Sexual activity: Not on file  Other Topics Concern   Not on file  Social History Narrative   Not on file   Social Determinants of Health   Financial Resource Strain: Not on file  Food Insecurity: Not on file  Transportation Needs: Not on file  Physical Activity: Not on file  Stress: Not on file  Social Connections: Not on file  Intimate Partner Violence: Not on file    Home Medications:  Medications reconciled in EPIC  No current facility-administered medications on file prior to encounter.   Current Outpatient Medications on File Prior to Encounter  Medication  Sig Dispense Refill   aspirin EC 81 MG tablet Take 81 mg by mouth daily.     bisacodyl (BISACODYL) 5 MG EC tablet Take 2 tablets ('10mg'$ ) by mouth the day before your procedure between 1pm-3pm. 30 tablet 0   Calcium Carbonate-Vitamin D (CALCIUM 500 + D) 500-125 MG-UNIT TABS Take by mouth.     docusate sodium (COLACE) 100 MG capsule Take 100 mg by mouth daily.     furosemide (LASIX) 20 MG tablet Take 20 mg by mouth 2 (two) times daily.     gabapentin (NEURONTIN) 800 MG tablet Take by  mouth.     hydrochlorothiazide (HYDRODIURIL) 25 MG tablet Take by mouth.     hydrOXYzine (ATARAX/VISTARIL) 25 MG tablet Take 25 mg by mouth daily. Take '25mg'$ . By mouth every morning     losartan (COZAAR) 100 MG tablet Take 100 mg by mouth daily.     metFORMIN (GLUCOPHAGE) 500 MG tablet Take 500 mg by mouth 2 (two) times daily with a meal.     oxybutynin (DITROPAN-XL) 10 MG 24 hr tablet Take 10 mg by mouth at bedtime.     potassium chloride (K-DUR) 10 MEQ tablet Take by mouth.     pregabalin (LYRICA) 75 MG capsule Take 75 mg by mouth daily.     sertraline (ZOLOFT) 25 MG tablet Take by mouth.     simvastatin (ZOCOR) 20 MG tablet Take 20 mg by mouth daily.      Allergies:  Allergies  Allergen Reactions   Ace Inhibitors Itching    Rash   Cyclobenzaprine Itching    Rash and 'made me nervous'    Physical Exam:  Temp:  [98.5 F (36.9 C)] 98.5 F (36.9 C) (08/05 1111) Pulse Rate:  [83-91] 83 (08/05 1430) Resp:  [13-17] 13 (08/05 1430) BP: (83-107)/(64-73) 104/71 (08/05 1430) SpO2:  [100 %] 100 % (08/05 1430) Weight:  [67.6 kg] 67.6 kg (08/05 1113)   General Appearance:  Well developed, well nourished, no acute distress, alert and oriented x3 HEENT:  Normocephalic atraumatic, extraocular movements intact, moist mucous membranes Cardiovascular:  Normal S1/S2, regular rate and rhythm, no murmurs Pulmonary:  clear to auscultation, no wheezes, rales or rhonchi, symmetric air entry, good air exchange Abdomen:  Bowel sounds present, soft, diffuse TTP . No rebound . Neg heal tap Extremities:  Full range of motion, no pedal edema, 2+ distal pulses, no tenderness Skin:  normal coloration and turgor, no rashes, no suspicious skin lesions noted  Neurologic:  Cranial nerves 2-12 grossly intact, normal muscle tone, strength 5/5 all four extremities Psychiatric:  Normal mood and affect, appropriate, no AH/VH Pelvic:  bedside bimanual:  Cervix firm irregular shape  UTX + TTP  Purulent d/c noted on  exam glove  Labs/Studies:   CBC and Coags:  Lab Results  Component Value Date   WBC 23.3 (H) 12/01/2020   HGB 11.0 (L) 12/01/2020   HCT 31.3 (L) 12/01/2020   MCV 98.7 12/01/2020   PLT 426 (H) 12/01/2020   INR 1.1 12/01/2020   CMP:  Lab Results  Component Value Date   NA 136 12/01/2020   K 5.1 12/01/2020   CL 103 12/01/2020   CO2 24 12/01/2020   BUN 22 12/01/2020   CREATININE 1.24 (H) 12/01/2020   CREATININE 1.05 (H) 11/18/2020   CREATININE 0.98 02/24/2019   PROT 6.9 02/24/2019   BILITOT 0.2 02/24/2019   ALT 12 02/24/2019   AST 16 02/24/2019   ALKPHOS 66 02/24/2019    Other Imaging:  CT Angio Head W or Wo Contrast  Result Date: 11/18/2020 CLINICAL DATA:  Headache.  Syncope EXAM: CT ANGIOGRAPHY HEAD TECHNIQUE: Multidetector CT imaging of the head was performed using the standard protocol during bolus administration of intravenous contrast. Multiplanar CT image reconstructions and MIPs were obtained to evaluate the vascular anatomy. CONTRAST:  70m OMNIPAQUE IOHEXOL 350 MG/ML SOLN COMPARISON:  None FINDINGS: CT HEAD Brain: No evidence of acute infarction, hemorrhage, hydrocephalus, extra-axial collection or mass lesion/mass effect. Vascular: See below Skull: Normal. Negative for fracture or focal lesion. Sinuses: Clear CTA HEAD Anterior circulation: Atheromatous calcification on the carotid siphons. The left ICA is smaller than the right in the setting of hypoplastic left A1 segment. Approximately 50-60% left cavernous ICA stenosis as measured on reformats, accuracy limited by calcified plaque blooming. No branch occlusion, beading, or aneurysm. No evidence of vascular malformation. Posterior circulation: Right dominant vertebral artery with V4 segment plaque that is nonobstructive. No branch occlusion, beading, or aneurysm Venous sinuses: Not opacified on this arterial study Anatomic variants: As above Review of the MIP images confirms the above findings. IMPRESSION: 1. No emergent  finding. No intracranial vertebrobasilar insufficiency. 2. Atherosclerosis of the carotid siphons with at least 50% narrowing on the left. Electronically Signed   By: JMonte FantasiaM.D.   On: 11/18/2020 10:02   CT ABDOMEN PELVIS W CONTRAST  Result Date: 12/01/2020 CLINICAL DATA:  Lower abdominal pain for 24 hours EXAM: CT ABDOMEN AND PELVIS WITH CONTRAST TECHNIQUE: Multidetector CT imaging of the abdomen and pelvis was performed using the standard protocol following bolus administration of intravenous contrast. CONTRAST:  843mOMNIPAQUE IOHEXOL 350 MG/ML SOLN COMPARISON:  11/04/2018 FINDINGS: Lower chest: No acute abnormality. Hepatobiliary: No solid liver abnormality is seen. No gallstones, gallbladder wall thickening, or biliary dilatation. Pancreas: Unremarkable. No pancreatic ductal dilatation or surrounding inflammatory changes. Spleen: Normal in size without significant abnormality. Adrenals/Urinary Tract: Adrenal glands are unremarkable. Kidneys are normal, without renal calculi, solid lesion, or hydronephrosis. Bladder is unremarkable. Stomach/Bowel: Stomach is within normal limits. Appendix appears normal. No evidence of bowel wall thickening, distention, or inflammatory changes. Pancolonic diverticulosis. Vascular/Lymphatic: Aortic atherosclerosis. No enlarged abdominal or pelvic lymph nodes. Reproductive: Air within the fundal endometrial cavity (series 6, image 86). There is a new, thickly septated right ovarian mass measuring 5.1 x 5.1 cm (series 2, image 64). Other: No abdominal wall hernia or abnormality. No abdominopelvic ascites. Musculoskeletal: No acute or significant osseous findings. IMPRESSION: 1. There is a new, thickly septated right ovarian mass measuring 5.1 x 5.1 cm. 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 Recommend initial pelvic ultrasound and likely subsequent MRI to further evaluate, which may be performed on a nonemergent basis. 2. Air within the fundal endometrial cavity.  Correlate for recent instrumentation. 3. Pancolonic diverticulosis without evidence of acute diverticulitis. Aortic Atherosclerosis (ICD10-I70.0). Electronically Signed   By: AlEddie Candle.D.   On: 12/01/2020 15:48   DG Chest Port 1 View  Result Date: 12/01/2020 CLINICAL DATA:  Question sepsis.  Short of breath. EXAM: PORTABLE CHEST 1 VIEW COMPARISON:  11/04/2018 FINDINGS: Cardiac enlargement. Negative for heart failure or edema. Lungs are clear without infiltrate or effusion. IMPRESSION: Cardiac enlargement without acute cardiopulmonary abnormality. Electronically Signed   By: ChFranchot Gallo.D.   On: 12/01/2020 12:52     Assessment / Plan:   Rebecca M PARTHENA EDSTROMs a [age].o. No obstetric history on file. who presents with lower central abdominal pain . Air in uterine cavity c/w endometritis with possiblity of underlying  cancer. Abnormal cervix on exam  also concerning for cancer I will perform a fx D+C and cervical biopsies in OR tonight and place her on Gentmycin and clindamycin .  Ovarian cyst with TVUS ordered for tomorrow   Abnormal UA c/w UTI . LActic acid < 2 , not c/w urosepsis,  gentamycin should cover awaiting culture   Hospitalist consulted and will manage medical issues while in hospital      Admit to [facility] service

## 2020-12-01 NOTE — Progress Notes (Signed)
Pharmacy Antibiotic Note  Rebecca Parker is a 70 y.o. female admitted on 12/01/2020. Concern for endometritis with plan for D+C 8/5. Pharmacy has been consulted for gentamicin dosing for endometritis.  Plan: Gentamicin 5 mg/kg IV x 1. Plan to get level approximately 12 hours after dose to determine dosing interval.  On clindamycin.  Will follow antibiotic plan.  Height: 5' 1.5" (156.2 cm) Weight: 67.6 kg (149 lb) IBW/kg (Calculated) : 48.95  Temp (24hrs), Avg:98.3 F (36.8 C), Min:98.1 F (36.7 C), Max:98.5 F (36.9 C)  Recent Labs  Lab 12/01/20 1311  WBC 23.3*  CREATININE 1.24*  LATICACIDVEN 1.6    Estimated Creatinine Clearance: 36.5 mL/min (A) (by C-G formula based on SCr of 1.24 mg/dL (H)).    Allergies  Allergen Reactions   Ace Inhibitors Itching    Rash   Cyclobenzaprine Itching    Rash and 'made me nervous'    Antimicrobials this admission: Cefepime 8/5 x 1 Clindamycin 8/5 >> Gentamicin 8/5 >>  Microbiology results: 8/5 BCx: pending 8/5 UCx: pending    Thank you for allowing pharmacy to be a part of this patient's care.  Tawnya Crook, PharmD, BCPS 12/01/2020 6:13 PM

## 2020-12-01 NOTE — Brief Op Note (Signed)
12/01/2020  6:44 PM  PATIENT:  Rebecca Parker  70 y.o. female  PRE-OPERATIVE DIAGNOSIS:  abdominal pain, pyometrium   POST-OPERATIVE DIAGNOSIS:  same as above   PROCEDURE:  Procedure(s): DILATATION AND CURETTAGE (N/A), fractional  Cervical biopsy  SURGEON:  Surgeon(s) and Role:    * Berneice Zettlemoyer, Gwen Her, MD - Primary  PHYSICIAN ASSISTANT: CSt  ASSISTANTS: none   ANESTHESIA:   general  EBL:  20 mL   BLOOD ADMINISTERED:none  DRAINS: none   LOCAL MEDICATIONS USED:  NONE  SPECIMEN:  Source of Specimen:  cervical biopsies 12/3/6/9 o' clock   DISPOSITION OF SPECIMEN:  PATHOLOGY  COUNTS:  YES  TOURNIQUET:  * No tourniquets in log *  DICTATION: .Other Dictation: Dictation Number verbal   PLAN OF CARE: Admit to inpatient   PATIENT DISPOSITION:  PACU - hemodynamically stable.   Delay start of Pharmacological VTE agent (>24hrs) due to surgical blood loss or risk of bleeding: not applicable

## 2020-12-01 NOTE — Anesthesia Procedure Notes (Signed)
Procedure Name: Intubation Date/Time: 12/01/2020 6:23 PM Performed by: Rolla Plate, CRNA Pre-anesthesia Checklist: Patient identified, Patient being monitored, Timeout performed, Emergency Drugs available and Suction available Patient Re-evaluated:Patient Re-evaluated prior to induction Oxygen Delivery Method: Circle system utilized Preoxygenation: Pre-oxygenation with 100% oxygen Induction Type: IV induction and Rapid sequence Laryngoscope Size: 3 and McGraph Grade View: Grade I Tube type: Oral Tube size: 7.0 mm Number of attempts: 1 Airway Equipment and Method: Stylet and Video-laryngoscopy Placement Confirmation: ETT inserted through vocal cords under direct vision, positive ETCO2 and breath sounds checked- equal and bilateral Secured at: 21 cm Tube secured with: Tape Dental Injury: Teeth and Oropharynx as per pre-operative assessment

## 2020-12-01 NOTE — Progress Notes (Signed)
PHARMACY -  BRIEF ANTIBIOTIC NOTE   Pharmacy has received consult(s) for cefepime from an ED provider.  The patient's profile has been reviewed for ht/wt/allergies/indication/available labs.    One time order(s) placed for cefepime 2 g  Further antibiotics/pharmacy consults should be ordered by admitting physician if indicated.                       Thank you,  Tawnya Crook, PharmD, BCPS 12/01/2020  2:07 PM

## 2020-12-01 NOTE — Progress Notes (Signed)
CODE SEPSIS - PHARMACY COMMUNICATION  **Broad Spectrum Antibiotics should be administered within 1 hour of Sepsis diagnosis**  Time Code Sepsis Called/Page Received: 1410  Antibiotics Ordered:  Cefepime 2gm  Time of 1st antibiotic administration: 1524  Additional action taken by pharmacy: Alert about time sensitive Code Stroke at 1503.   RN was taking care of another patient at the time.   Kerin Cecchi Rodriguez-Guzman PharmD, BCPS 12/01/2020 3:53 PM

## 2020-12-01 NOTE — ED Notes (Signed)
Patient voided on bedpan.

## 2020-12-01 NOTE — ED Provider Notes (Signed)
[facility] Emergency Department Provider Note   ____________________________________________   Event Date/Time   First MD Initiated Contact with Patient 12/01/20 1132     (approximate)  I have reviewed the triage vital signs and the nursing notes.   HISTORY  Chief Complaint Abdominal Pain and Shortness of Breath    HPI Rebecca Parker is a 70 y.o. female past medical history as noted below, notable for diabetes and CHF as well.  Presents today for evaluation of low blood pressure and abdominal pain  Patient has been having issues where she is very lightheaded and is passed out twice now at least in the last couple of weeks.  Once she came to the ER and was evaluated, and then another time when she was evaluated at [facility] according to her and her sister on Monday  The day she presents and was going to get breakfast but she reports she is having abdominal pain especially in her lower abdomen that is caused her not to have any appetite, she went to the clinic feeling lightheaded and there is noted by clinic charts that she had a blood pressure in the 70s.  She does have some slight shortness of breath when she is outside in the heat but otherwise denies shortness of breath and is not short of breath now.  No chest pain.  Had a headache when she passed out a couple weeks ago but was seen in the ER, no headaches since.  No fevers or chills.  No vomiting.  No nausea.  Reports no appetite though he attributed it to having lower abdominal pain.  Was seen recently on the 22nd for syncopal episode.  Is accompanied by her sister who is very pleasant Past Medical History:  Diagnosis Date   Chest pain, unspecified    CHF (congestive heart failure) ([place])    Diabetes mellitus without complication ([place])    Hip pain    Hypertension    Multinodular goiter     Patient Active Problem List   Diagnosis Date Noted   Sepsis ([place]) 12/01/2020   Ovarian mass, right  12/01/2020   Acute lower UTI 12/01/2020   Pelvic pain 12/01/2020   Morbid (severe) obesity due to excess calories ([place]) 11/15/2015   Mixed incontinence 10/24/2015   Venous insufficiency of both lower extremities 11/24/2014   Benign essential hypertension 11/18/2014   Chronic diastolic CHF (congestive heart failure), NYHA class 2 ([place]) 05/17/2014   Mixed hyperlipidemia 05/17/2014   Tobacco dependence 02/10/2014   Thyroid nodule 02/10/2014   Spinal stenosis, lumbar region without neurogenic claudication 03/05/2013   Constipation 01/29/2013   Eosinophil count raised 10/20/2012   Urinary incontinence 10/19/2012   Type 2 diabetes mellitus without complications ([place]) 99991111   Neck pain 06/15/2012   Polyneuropathy 11/15/2011   Palmar fascial fibromatosis 09/13/2011   Edema 05/29/2011   Vitamin D deficiency 07/31/2009   Insomnia 07/27/2009    Past Surgical History:  Procedure Laterality Date   COLONOSCOPY WITH PROPOFOL N/A 11/24/2015   Procedure: COLONOSCOPY WITH PROPOFOL;  Surgeon: Lollie Sails, MD;  [facility] ENDOSCOPY;  Service: Endoscopy;  Laterality: N/A;   COLONOSCOPY WITH PROPOFOL N/A 03/18/2019   Procedure: COLONOSCOPY WITH PROPOFOL;  Surgeon: Virgel Manifold, MD;  Location: [facility] ENDOSCOPY;  Service: Endoscopy;  Laterality: N/A;   ESOPHAGOGASTRODUODENOSCOPY (EGD) WITH PROPOFOL N/A 03/18/2019   Procedure: ESOPHAGOGASTRODUODENOSCOPY (EGD) WITH PROPOFOL;  Surgeon: Virgel Manifold, MD;  Location: [facility] ENDOSCOPY;  Service: Endoscopy;  Laterality: N/A;  right ankle orif      Prior to Admission medications   Medication Sig Start Date End Date Taking? Authorizing Provider  aspirin EC 81 MG tablet Take 81 mg by mouth daily.    [provider]  bisacodyl (BISACODYL) 5 MG EC tablet Take 2 tablets ('10mg'$ ) by mouth the day before your procedure between 1pm-3pm. 03/18/19   Virgel Manifold, MD  Calcium Carbonate-Vitamin D (CALCIUM 500 + D) 500-125 MG-UNIT  TABS Take by mouth.    [provider]  docusate sodium (COLACE) 100 MG capsule Take 100 mg by mouth daily.    [provider]  furosemide (LASIX) 20 MG tablet Take 20 mg by mouth 2 (two) times daily.    [provider]  gabapentin (NEURONTIN) 800 MG tablet Take by mouth. 12/20/13   [provider]  hydrochlorothiazide (HYDRODIURIL) 25 MG tablet Take by mouth.    [provider]  hydrOXYzine (ATARAX/VISTARIL) 25 MG tablet Take 25 mg by mouth daily. Take '25mg'$ . By mouth every morning    [provider]  losartan (COZAAR) 100 MG tablet Take 100 mg by mouth daily.    [provider]  metFORMIN (GLUCOPHAGE) 500 MG tablet Take 500 mg by mouth 2 (two) times daily with a meal.    [provider]  oxybutynin (DITROPAN-XL) 10 MG 24 hr tablet Take 10 mg by mouth at bedtime.    [provider]  potassium chloride (K-DUR) 10 MEQ tablet Take by mouth. 10/27/15   [provider]  pregabalin (LYRICA) 75 MG capsule Take 75 mg by mouth daily.    [provider]  sertraline (ZOLOFT) 25 MG tablet Take by mouth. 10/27/15   [provider]  simvastatin (ZOCOR) 20 MG tablet Take 20 mg by mouth daily.    [provider]    Allergies Ace inhibitors and Cyclobenzaprine  Family History  Problem Relation Age of Onset   Breast cancer Maternal Aunt     Social History Social History   Tobacco Use   Smoking status: Every Day    Packs/day: 0.10    Types: Cigarettes   Smokeless tobacco: Never    Review of Systems Constitutional: No fever/chills Eyes: No visual changes. ENT: No sore throat. Cardiovascular: Denies chest pain. Respiratory: See HPI Gastrointestinal: Lower abdominal pain for about 24 hours Genitourinary: Negative for dysuria. Musculoskeletal: Negative for back pain. Skin: Negative for rash. Neurological: Negative for areas of focal weakness or  numbness.    ____________________________________________   PHYSICAL EXAM:  VITAL SIGNS: ED Triage Vitals  Enc Vitals Group     BP 12/01/20 1111 107/73     Pulse Rate 12/01/20 1111 91     Resp 12/01/20 1111 16     Temp 12/01/20 1111 98.5 F (36.9 C)     Temp Source 12/01/20 1111 Oral     SpO2 --      Weight 12/01/20 1113 149 lb (67.6 kg)     Height 12/01/20 1113 5' 1.5" (1.562 m)     Head Circumference --      Peak Flow --      Pain Score 12/01/20 1112 9     Pain Loc --      Pain Edu? --      Excl. in [place]? --     Constitutional: Alert and oriented.  Mildly ill-appearing but in no acute distress.  Hypotensive blood pressure 123XX123 systolic Eyes: Conjunctivae are normal. Head: Atraumatic. Nose: No congestion/rhinnorhea. Mouth/Throat: Mucous membranes are  moderately dry. Neck: No stridor.  No JVD. Cardiovascular: Normal rate, regular rhythm. Grossly normal heart sounds.  Good peripheral circulation. Respiratory: Normal respiratory effort.  No retractions. Lungs CTAB. Gastrointestinal: Soft and reports fairly significant tenderness throughout all quadrants on exam but palpation of upper abdomen refers pain to the lower abdomen bilaterally. No distention.  Exam is positive for concerns for possible acute abdomen. Musculoskeletal: No lower extremity tenderness nor edema. Neurologic:  Normal speech and language. No gross focal neurologic deficits are appreciated.  Skin:  Skin is warm, dry and intact. No rash noted. Psychiatric: Mood and affect are normal. Speech and behavior are normal.  ____________________________________________   LABS (all labs ordered are listed, but only abnormal results are displayed)  Labs Reviewed  BASIC METABOLIC PANEL - Abnormal; Notable for the following components:      Result Value   Creatinine, Ser 1.24 (*)    Calcium 8.3 (*)    GFR, Estimated 46 (*)    All other components within normal limits  CBC - Abnormal; Notable for the following  components:   WBC 23.3 (*)    RBC 3.17 (*)    Hemoglobin 11.0 (*)    HCT 31.3 (*)    MCH 34.7 (*)    RDW 21.7 (*)    Platelets 426 (*)    All other components within normal limits  URINALYSIS, COMPLETE (UACMP) WITH MICROSCOPIC - Abnormal; Notable for the following components:   Color, Urine YELLOW (*)    APPearance CLOUDY (*)    Hgb urine dipstick SMALL (*)    Leukocytes,Ua LARGE (*)    WBC, UA >50 (*)    Bacteria, UA RARE (*)    All other components within normal limits  APTT - Abnormal; Notable for the following components:   aPTT 39 (*)    All other components within normal limits  RESP PANEL BY RT-PCR (FLU A&B, COVID) ARPGX2  CULTURE, BLOOD (SINGLE)  URINE CULTURE  CULTURE, BLOOD (SINGLE)  LACTIC ACID, PLASMA  PROTIME-INR  LIPASE, BLOOD  LACTIC ACID, PLASMA  BRAIN NATRIURETIC PEPTIDE  PROCALCITONIN  PROCALCITONIN  HEMOGLOBIN A1C  COMPREHENSIVE METABOLIC PANEL  CBC  CBG MONITORING, ED  CBG MONITORING, ED  TYPE AND SCREEN  TROPONIN I (HIGH SENSITIVITY)   ____________________________________________  EKG  Reviewed inter by me at 1115 Heart rate 86 QRS 89 QTc 450 Normal sinus rhythm, possible old septal infarct.  No evidence of acute ischemia denoted.  Old compared with previous EKG ____________________________________________  RADIOLOGY  CT ABDOMEN PELVIS W CONTRAST  Result Date: 12/01/2020 CLINICAL DATA:  Lower abdominal pain for 24 hours EXAM: CT ABDOMEN AND PELVIS WITH CONTRAST TECHNIQUE: Multidetector CT imaging of the abdomen and pelvis was performed using the standard protocol following bolus administration of intravenous contrast. CONTRAST:  15m OMNIPAQUE IOHEXOL 350 MG/ML SOLN COMPARISON:  11/04/2018 FINDINGS: Lower chest: No acute abnormality. Hepatobiliary: No solid liver abnormality is seen. No gallstones, gallbladder wall thickening, or biliary dilatation. Pancreas: Unremarkable. No pancreatic ductal dilatation or surrounding inflammatory changes.  Spleen: Normal in size without significant abnormality. Adrenals/Urinary Tract: Adrenal glands are unremarkable. Kidneys are normal, without renal calculi, solid lesion, or hydronephrosis. Bladder is unremarkable. Stomach/Bowel: Stomach is within normal limits. Appendix appears normal. No evidence of bowel wall thickening, distention, or inflammatory changes. Pancolonic diverticulosis. Vascular/Lymphatic: Aortic atherosclerosis. No enlarged abdominal or pelvic lymph nodes. Reproductive: Air within the fundal endometrial cavity (series 6, image 86). There is a new, thickly septated right ovarian mass measuring 5.1 x 5.1 cm (  series 2, image 64). Other: No abdominal wall hernia or abnormality. No abdominopelvic ascites. Musculoskeletal: No acute or significant osseous findings. IMPRESSION: 1. There is a new, thickly septated right ovarian mass measuring 5.1 x 5.1 cm. 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 Recommend initial pelvic ultrasound and likely subsequent MRI to further evaluate, which may be performed on a nonemergent basis. 2. Air within the fundal endometrial cavity. Correlate for recent instrumentation. 3. Pancolonic diverticulosis without evidence of acute diverticulitis. Aortic Atherosclerosis (ICD10-I70.0). Electronically Signed   By: Eddie Candle M.D.   On: 12/01/2020 15:48   DG Chest Port 1 View  Result Date: 12/01/2020 CLINICAL DATA:  Question sepsis.  Short of breath. EXAM: PORTABLE CHEST 1 VIEW COMPARISON:  11/04/2018 FINDINGS: Cardiac enlargement. Negative for heart failure or edema. Lungs are clear without infiltrate or effusion. IMPRESSION: Cardiac enlargement without acute cardiopulmonary abnormality. Electronically Signed   By: Franchot Gallo M.D.   On: 12/01/2020 12:52      ____________________________________________   PROCEDURES  Procedure(s) performed: None  Procedures  Critical Care performed: Yes, see critical care  note(s)  ____________________________________________   INITIAL IMPRESSION / ASSESSMENT AND PLAN / ED COURSE  Pertinent labs & imaging results that were available during my care of the patient were reviewed by me and considered in my medical decision making (see chart for details).   Feeling of near syncope accompanied by abdominal pain for 24 hours, also having notable near syncopal episodes and reporting low blood pressure for 2 weeks.  Initially evaluated in ER and discharged.  She continues to feel very fatigued and weak having near syncopal episodes frequently and reports he passed out Monday as well went to [facility] clinic.  She denies any chest pain.  EKG reassuring.  I am very concerned however for her hypotension associate with abdominal pain.  Will obtain CT imaging labs, sepsis work-up, metabolic, cardiac, etc.  Clinical Course as of 12/01/20 1732  Fri Dec 01, 2020  1425 Patient is resting, discussed with the patient her lab results and concern for infection.  Answered questions around my concerns of possible sepsis.  Patient understanding agreeable with need for anticipated admission, awaiting CT scan, and initiating cefepime at this time as we await further testing including urinalysis.  Suspect etiology would be intra-abdominal based on her presentation [MQ]    Clinical Course User Index [MQ] Delman Kitten, MD   CRITICAL CARE Performed by: Delman Kitten   Total critical care time: 30 minutes  Critical care time was exclusive of separately billable procedures and treating other patients.  Critical care was necessary to treat or prevent imminent or life-threatening deterioration.  Critical care was time spent personally by me on the following activities: development of treatment plan with patient and/or surrogate as well as nursing, discussions with consultants, evaluation of patient's response to treatment, examination of patient, obtaining history from patient or surrogate,  ordering and performing treatments and interventions, ordering and review of laboratory studies, ordering and review of radiographic studies, pulse oximetry and re-evaluation of patient's condition.  ----------------------------------------- 5:32 PM on 12/01/2020 ----------------------------------------- Patient admitted to OB/GYN service, medicine service providing ongoing consultation. ____________________________________________   FINAL CLINICAL IMPRESSION(S) / ED DIAGNOSES  Final diagnoses:  Sepsis, due to unspecified organism, unspecified whether acute organ dysfunction present [facility])  Pelvic mass  Urinary tract infection, acute        Note:  This document was prepared using Dragon voice recognition software and may include unintentional dictation errors  Delman Kitten, MD 12/01/20 [phone]

## 2020-12-01 NOTE — Anesthesia Postprocedure Evaluation (Signed)
Anesthesia Post Note  Patient: Rebecca Parker  Procedure(s) Performed: DILATATION AND CURETTAGE with cervical biopsies  Patient location during evaluation: PACU Anesthesia Type: General Level of consciousness: awake and alert Pain management: pain level controlled Vital Signs Assessment: post-procedure vital signs reviewed and stable Respiratory status: spontaneous breathing, nonlabored ventilation, respiratory function stable and patient connected to nasal cannula oxygen Cardiovascular status: blood pressure returned to baseline and stable Postop Assessment: no apparent nausea or vomiting Anesthetic complications: no   No notable events documented.   Last Vitals:  Vitals:   12/01/20 1926 12/01/20 1930  BP:  114/66  Pulse: 80 81  Resp: 13 (!) 21  Temp: 37 C   SpO2: 100% 99%    Last Pain:  Vitals:   12/01/20 1915  TempSrc:   PainSc: 0-No pain                 Tonny Bollman

## 2020-12-01 NOTE — Anesthesia Preprocedure Evaluation (Addendum)
Anesthesia Evaluation   Patient awake    Reviewed: Allergy & Precautions, NPO status , Patient's Chart, lab work & pertinent test results  History of Anesthesia Complications Negative for: history of anesthetic complications  Airway Mallampati: I  TM Distance: >3 FB Neck ROM: Full    Dental no notable dental hx. (+) Edentulous Upper, Edentulous Lower   Pulmonary neg pulmonary ROS, Current Smoker and Patient abstained from smoking.,    Pulmonary exam normal breath sounds clear to auscultation       Cardiovascular Exercise Tolerance: Poor METS: < 3 Mets hypertension, (-) angina+CHF  (-) Orthopnea, (-) PND and (-) DOE Normal cardiovascular exam Rhythm:Regular Rate:Normal  Chronic HTN- now relative hypotension d/t sepsis; Chronic diastolic dysfunction Uses cane to ambulate- does not exercise   Neuro/Psych negative psych ROS   GI/Hepatic negative GI ROS, Neg liver ROS,   Endo/Other  negative endocrine ROSdiabetes, Well Controlled  Renal/GU negative Renal ROS  negative genitourinary   Musculoskeletal  (+) Arthritis , Osteoarthritis,    Abdominal   Peds  Hematology negative hematology ROS (+)   Anesthesia Other Findings   Reproductive/Obstetrics negative OB ROS                           Anesthesia Physical Anesthesia Plan  ASA: 3 and emergent  Anesthesia Plan: General   Post-op Pain Management:    Induction: Rapid sequence  PONV Risk Score and Plan: 2 and Ondansetron and Dexamethasone  Airway Management Planned: Oral ETT  Additional Equipment:   Intra-op Plan:   Post-operative Plan: Extubation in OR  Informed Consent: I have reviewed the patients History and Physical, chart, labs and discussed the procedure including the risks, benefits and alternatives for the proposed anesthesia with the patient or authorized representative who has indicated his/her understanding and acceptance.      Dental Advisory Given  Plan Discussed with: Anesthesiologist, CRNA and Surgeon  Anesthesia Plan Comments: (Modified RSI; 2 Lbore IV  Patient consented for risks of anesthesia including but not limited to:  - adverse reactions to medications - damage to eyes, teeth, lips or other oral mucosa - nerve damage due to positioning  - sore throat or hoarseness - Damage to heart, brain, nerves, lungs, other parts of body or loss of life  Patient voiced understanding.)       Anesthesia Quick Evaluation

## 2020-12-01 NOTE — Op Note (Signed)
NAME: Rebecca Parker, [place] MEDICAL RECORD NO: [id number] ACCOUNT NO: [id number] DATE OF BIRTH: [birth year] FACILITY: [facility] LOCATION: [facility] PHYSICIAN: Boykin Nearing, MD  Operative Report   DATE OF PROCEDURE: 12/01/2020   PREOPERATIVE DIAGNOSES:   1.  Pyometrium. 2.  Endometritis.  POSTOPERATIVE DIAGNOSIS:   1.  Pyometrium. 2.  Endometritis. 3.  Rule out uterine cancer, rule out endometrial cancer.  PROCEDURE:   1.  Fractional dilation and curettage. 2.  Cervical biopsy.  SURGEON:  Boykin Nearing, MD  ANESTHESIA:  General endotracheal anesthesia.  INDICATIONS:  A 70 year old gravida 3, para 3, patient admitted through the emergency department for acute pelvic and abdominal pain.  The patient with leukocytosis, white blood count of 23,000 and on bimanual exam the patient had purulent vaginal  discharge with a firm cervical rim.  On CT scan, there was air within the endometrial cavity.  DESCRIPTION OF PROCEDURE:  After adequate general endotracheal anesthesia, the patient was placed in dorsal supine position with the legs in the candy cane stirrups.  The patient was prepped and draped in sterile fashion.  Timeout was performed.   Weighted speculum was placed in the vagina.  The anterior cervix was grasped with a single tooth tenaculum and cervical biopsies were performed at 12, 3, 6, and 9 o'clock.  Cervix was then dilated to, an endocervical curettage was performed followed by  cervical dilation to #20 Hanks dilator.  Endometrial curettage was then performed after sounding the uterus to 9 cm.  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  Monsel's solution applied to the cervical  biopsy sites and good hemostasis was noted.  There were no complications.  Of note, the patient's bladder was drained prior to the commencement of the surgery yielding 100 mL clear urine.  The patient was taken to recovery room in good  condition.  INTRAOPERATIVE FLUIDS:  300 mL.   ESTIMATED BLOOD LOSS:  20 mL.   URINE OUTPUT:  100 mL.     SUJ D: 12/01/2020 6:52:53 pm T: 12/01/2020 11:14:00 pm  JOB: [id number]

## 2020-12-02 ENCOUNTER — Inpatient Hospital Stay: Payer: Medicare Other

## 2020-12-02 ENCOUNTER — Encounter: Payer: Self-pay | Admitting: Obstetrics and Gynecology

## 2020-12-02 LAB — URINE CULTURE: Culture: <10000 — AB

## 2020-12-02 LAB — CBC
HCT: 28 % — ABNORMAL LOW (ref 36.0–46.0)
Hemoglobin: 9.6 g/dL — ABNORMAL LOW (ref 12.0–15.0)
MCH: 33.9 pg (ref 26.0–34.0)
MCHC: 34.3 g/dL (ref 30.0–36.0)
MCV: 98.9 fL (ref 80.0–100.0)
Platelets: 413 10*3/uL — ABNORMAL HIGH (ref 150–400)
RBC: 2.83 MIL/uL — ABNORMAL LOW (ref 3.87–5.11)
RDW: 21 % — ABNORMAL HIGH (ref 11.5–15.5)
WBC: 21.7 10*3/uL — ABNORMAL HIGH (ref 4.0–10.5)
nRBC: 0 % (ref 0.0–0.2)

## 2020-12-02 LAB — COMPREHENSIVE METABOLIC PANEL
ALT: 10 U/L (ref 0–44)
AST: 17 U/L (ref 15–41)
Albumin: 1.8 g/dL — ABNORMAL LOW (ref 3.5–5.0)
Alkaline Phosphatase: 74 U/L (ref 38–126)
Anion gap: 8 (ref 5–15)
BUN: 18 mg/dL (ref 8–23)
CO2: 24 mmol/L (ref 22–32)
Calcium: 7.9 mg/dL — ABNORMAL LOW (ref 8.9–10.3)
Chloride: 106 mmol/L (ref 98–111)
Creatinine, Ser: 1.07 mg/dL — ABNORMAL HIGH (ref 0.44–1.00)
GFR, Estimated: 55 mL/min — ABNORMAL LOW (ref >60)
Glucose, Bld: 80 mg/dL (ref 70–99)
Potassium: 4.7 mmol/L (ref 3.5–5.1)
Sodium: 138 mmol/L (ref 135–145)
Total Bilirubin: 0.5 mg/dL (ref 0.3–1.2)
Total Protein: 6.5 g/dL (ref 6.5–8.1)

## 2020-12-02 LAB — URINE DRUG SCREEN, QUALITATIVE (ARMC ONLY)
Amphetamines, Ur Screen: NOT DETECTED
Barbiturates, Ur Screen: NOT DETECTED
Benzodiazepine, Ur Scrn: NOT DETECTED
Cannabinoid 50 Ng, Ur ~~LOC~~: NOT DETECTED
Cocaine Metabolite,Ur ~~LOC~~: POSITIVE — AB
MDMA (Ecstasy)Ur Screen: NOT DETECTED
Methadone Scn, Ur: NOT DETECTED
Opiate, Ur Screen: NOT DETECTED
Phencyclidine (PCP) Ur S: NOT DETECTED
Tricyclic, Ur Screen: NOT DETECTED

## 2020-12-02 LAB — GENTAMICIN LEVEL, RANDOM: Gentamicin Rm: 7.1 ug/mL

## 2020-12-02 LAB — GLUCOSE, CAPILLARY
Glucose-Capillary: 81 mg/dL (ref 70–99)
Glucose-Capillary: 69 mg/dL — ABNORMAL LOW (ref 70–99)
Glucose-Capillary: 96 mg/dL (ref 70–99)

## 2020-12-02 LAB — HEMOGLOBIN A1C
Hgb A1c MFr Bld: 5.4 % (ref 4.8–5.6)
Mean Plasma Glucose: 108.28 mg/dL

## 2020-12-02 LAB — PROCALCITONIN: Procalcitonin: 1.03 ng/mL

## 2020-12-02 MED ORDER — SODIUM CHLORIDE 0.9 % IV SOLN: INTRAVENOUS | Status: DC | PRN

## 2020-12-02 NOTE — Evaluation (Signed)
Physical Therapy Evaluation Patient Details Name: Rebecca Parker MRN: [id number] DOB: [birth year] Today's Date: 12/02/2020   History of Present Illness  Pt is a 70 y/o F admitted with c/o lower abdominal pain x 24 hrs & presyncopal driving to her PCP [place].  Pt underwent dilation & curettage with cervical biopsies on 12/01/20. PMH: chest pain, CHF, DM, hip pain, HTN, multinodular goiter   Clinical Impression  Pt seen for PT evaluation with sister arriving shortly after PT. Pt lives at home with son who works during the day, with pt using SPC PRN. On this date, pt requests to use RW PT brought to room. Pt is able to ambulate 1 lap around nurses station with supervision & toilet without physical assistance. Pt would benefit from acute PT services to address gait with LRAD, balance & stair negotiation.     Follow Up Recommendations Home health PT;Supervision - Intermittent    Equipment Recommendations  None recommended by PT    Recommendations for Other Services       Precautions / Restrictions Precautions Precautions: Fall Restrictions Weight Bearing Restrictions: No      Mobility  Bed Mobility Overal bed mobility: Modified Independent             General bed mobility comments: supine>sit with HOB elevated    Transfers Overall transfer level: Modified independent Equipment used: Rolling walker (2 wheeled)             General transfer comment: sit<>stand  Ambulation/Gait Ambulation/Gait assistance: Supervision Gait Distance (Feet): 175 Feet Assistive device: Rolling walker (2 wheeled) Gait Pattern/deviations: Decreased step length - left;Decreased step length - right;Decreased stride length Gait velocity: decreased   General Gait Details: cuing to ambulate closer to RW vs pushing it out in front  Stairs            Wheelchair Mobility    Modified Rankin (Stroke Patients Only)       Balance Overall balance assessment: Needs assistance Sitting-balance  support: Feet supported;Bilateral upper extremity supported Sitting balance-Leahy Scale: Normal       Standing balance-Leahy Scale: Fair                               Pertinent Vitals/Pain Pain Assessment: Faces Faces Pain Scale: Hurts little more Pain Location: abdomen 2/2 recent ultrasound Pain Descriptors / Indicators: Discomfort Pain Intervention(s): Monitored during session    Home Living Family/patient expects to be discharged to:: Private residence Living Arrangements: Children Available Help at Discharge: Family;Available PRN/intermittently (son works during the day) Type of Home: Apartment Home Access: Stairs to enter Entrance Stairs-Rails: Right;Left;Can reach both Entrance Stairs-Number of Steps: 5-6 Home Layout: One level Home Equipment: Motley - 2 wheels;Cane - single point Agricultural consultant)      Prior Function     Gait / Transfers Assistance Needed: Ambulatory with PRN use of SPC. Denies falls, stating she gets dizzy at home, but sister reports 2 falls in past 6 months. Sister reports pt still drives but shouldn't.           Hand Dominance        Extremity/Trunk Assessment   Upper Extremity Assessment Upper Extremity Assessment: Overall WFL for tasks assessed    Lower Extremity Assessment Lower Extremity Assessment: Generalized weakness       Communication   Communication: No difficulties  Cognition Arousal/Alertness: Awake/alert Behavior During Therapy: Flat affect Overall Cognitive Status: Within Functional Limits for tasks assessed  General Comments: irritable, sister encourages pt to be "nice" and participate      General Comments General comments (skin integrity, edema, etc.): Pt with continent void on toilet without physical assistance.    Exercises     Assessment/Plan    PT Assessment Patient needs continued PT services  PT Problem List Decreased strength;Decreased  mobility;Decreased safety awareness;Decreased balance       PT Treatment Interventions DME instruction;Therapeutic activities;Gait training;Therapeutic exercise;Patient/family education;Stair training;Balance training;Functional mobility training;Neuromuscular re-education    PT Goals (Current goals can be found in the Care Plan section)  Acute Rehab PT Goals Patient Stated Goal: decreased pain, go home PT Goal Formulation: With patient Time For Goal Achievement: 12/16/20 Potential to Achieve Goals: Good    Frequency Min 2X/week   Barriers to discharge Decreased caregiver support alone during the day    Co-evaluation               AM-PAC PT "6 Clicks" Mobility  Outcome Measure Help needed turning from your back to your side while in a flat bed without using bedrails?: None Help needed moving from lying on your back to sitting on the side of a flat bed without using bedrails?: None Help needed moving to and from a bed to a chair (including a wheelchair)?: A Little Help needed standing up from a chair using your arms (e.g., wheelchair or bedside chair)?: None Help needed to walk in hospital room?: A Little Help needed climbing 3-5 steps with a railing? : A Little 6 Click Score: 21    End of Session   Activity Tolerance: Patient tolerated treatment well Patient left: in bed;with call bell/phone within reach;with family/visitor present Nurse Communication: Mobility status PT Visit Diagnosis: Unsteadiness on feet (R26.81)    Time: 1140-1156 PT Time Calculation (min) (ACUTE ONLY): 16 min   Charges:   PT Evaluation $PT Eval Low Complexity: Scofield, PT, DPT 12/02/20, 12:19 PM   Waunita Schooner 12/02/2020, 12:16 PM

## 2020-12-02 NOTE — Progress Notes (Signed)
Re instructed Pt. Not to get up out of bed without assistance due to ABO infusing at this time, need for walker and Fall risk. Pt. V/O.

## 2020-12-02 NOTE — Progress Notes (Signed)
Pharmacy Antibiotic Note  Rebecca Parker is a 70 y.o. female admitted on 12/01/2020. Concern for endometritis with plan for D+C 8/5. Pharmacy has been consulted for gentamicin dosing for endometritis.  Plan: Gentamicin level 7.1 drawn ~ 11 hrs post gentamicin dose. Based on nomogram, plan for q48h regimen which also aligns with patient's renal function. Will plan for gentamicin 5 mg/kg again 8/7 at 2200.  On clindamycin.  Discussed with Dr. Ouida Sills. Expect patient will be on gentamicin for at least a few more days. Will continue to follow along.  Height: 5' 1.5" (156.2 cm) Weight: 67.6 kg (149 lb) IBW/kg (Calculated) : 48.95  Temp (24hrs), Avg:98.1 F (36.7 C), Min:97.6 F (36.4 C), Max:98.6 F (37 C)  Recent Labs  Lab 12/01/20 1311 12/01/20 2031 12/02/20 0746 12/02/20 1010  WBC 23.3*  --  21.7*  --   CREATININE 1.24*  --  1.07*  --   LATICACIDVEN 1.6 1.4  --   --   GENTRANDOM  --   --   --  7.1     Estimated Creatinine Clearance: 42.3 mL/min (A) (by C-G formula based on SCr of 1.07 mg/dL (H)).    Allergies  Allergen Reactions   Ace Inhibitors Itching    Rash   Cyclobenzaprine Itching    Rash and 'made me nervous'    Antimicrobials this admission: Cefepime 8/5 x 1 Clindamycin 8/5 >> Gentamicin 8/5 >>  Microbiology results: 8/5 BCx: NG 8/5 UCx: pending    Thank you for allowing pharmacy to be a part of this patient's care.  Tawnya Crook, PharmD, BCPS 12/02/2020 11:15 AM

## 2020-12-02 NOTE — Progress Notes (Signed)
Consult Daily PROGRESS NOTE    Rebecca Parker  [id number] DOB: 07-07-1950 DOA: 12/01/2020 PCP: [facility] [place]  341A/341A-AA   Assessment & Plan:   Principal Problem:   Sepsis [facility]) Active Problems:   Benign essential hypertension   Chronic diastolic CHF (congestive heart failure), NYHA class 2 ([place])   Type 2 diabetes mellitus without complications (HCC)   Ovarian mass, right   Acute lower UTI   Pelvic pain    Rebecca Parker is a 70 y.o. female with medical history significant for diabetes mellitus and chronic diastolic dysfunction CHF who presents to the ER from her primary care provider's office for evaluation of abdominal pain and hypotension. Patient complains of diffuse abdominal pain which started on the day of mostly in the lower abdominal area.  She describes the pain as bandlike extending from right lower quadrant to the left lower quadrant.  She rates her pain a 10 x 10 in intensity at its worst and radiating.  Abdominal pain is associated with constipation but she denies having any nausea or vomiting.  She was seen at her doctor's office and was noted to be hypotensive with systolic blood pressure in the 70s.  She was sent to the ER via EMS for further evaluation.  Sepsis, ruled out --did not meet SIR criteria, only had leukocytosis on presentation.  Pyometrium and endometritis S/p DILATATION AND CURETTAGE --purulent vaginal discharge and imaging shows air within the fundal endometrial cavity.  --concern for endometrial cancer. --received empiric antibiotic therapy with cefepime in the ER as well as IV fluid resuscitation. Plan: --cont IV clinda  Hypotension --Patient noted to be hypotensive upon arrival to the ER --hold HCTZ and Cozaar and lasix  Hx of Diabetes mellitus, not currently active Hypoglycemia due to excessive insulin --takes only metformin at home --A1c 5.4 Plan: --d/c BG checks and SSI --d/c metformin at  discharge.  Ovarian mass Patient noted to have a thickly septated right ovarian mass measuring 5.9 x 5.1 cm.  Findings are concerning for ovarian neoplasm --workup per Gyn  Chronic diastolic dysfunction CHF Not acutely exacerbated --hold lasix due to hypotension  Depression Continue sertraline   DVT prophylaxis: SCD/Compression stockings Code Status: Full code  Family Communication: sister updated at bedside today Level of care: Postpartum Dispo:  per primary team   Subjective and Interval History:  Pt reported abdominal pain improved.  Had some nausea.     Objective: Vitals:   12/02/20 0400 12/02/20 0829 12/02/20 1218 12/02/20 1611  BP: 110/76 110/65 114/73 108/70  Pulse: 74 73 79 76  Resp:  '18 18 18  '$ Temp: 97.6 F (36.4 C) 97.8 F (36.6 C) 97.7 F (36.5 C) 97.8 F (36.6 C)  TempSrc: Oral Oral Oral Oral  SpO2: 100% 100%    Weight:      Height:        Intake/Output Summary (Last 24 hours) at 12/02/2020 1753 Last data filed at 12/02/2020 1514 Gross per 24 hour  Intake 2210 ml  Output 945 ml  Net 1265 ml   Filed Weights   12/01/20 1113  Weight: 67.6 kg    Examination:   Constitutional: NAD, alert, oriented, but some cognitive deficit HEENT: conjunctivae and lids normal, EOMI CV: No cyanosis.   RESP: normal respiratory effort, on RA Extremities: No effusions, edema in BLE SKIN: warm, dry Neuro: II - XII grossly intact.     Data Reviewed: I have personally reviewed following labs and imaging studies  CBC: Recent Labs  Lab 12/01/20 1311 12/02/20 0746  WBC 23.3* 21.7*  HGB 11.0* 9.6*  HCT 31.3* 28.0*  MCV 98.7 98.9  PLT 426* 123XX123*   Basic Metabolic Panel: Recent Labs  Lab 12/01/20 1311 12/02/20 0746  NA 136 138  K 5.1 4.7  CL 103 106  CO2 24 24  GLUCOSE 73 80  BUN 22 18  CREATININE 1.24* 1.07*  CALCIUM 8.3* 7.9*   GFR: Estimated Creatinine Clearance: 42.3 mL/min (A) (by C-G formula based on SCr of 1.07 mg/dL (H)). Liver Function  Tests: Recent Labs  Lab 12/02/20 0746  AST 17  ALT 10  ALKPHOS 74  BILITOT 0.5  PROT 6.5  ALBUMIN 1.8*   Recent Labs  Lab 12/01/20 1311  LIPASE 37   No results for input(s): AMMONIA in the last 168 hours. Coagulation Profile: Recent Labs  Lab 12/01/20 1311  INR 1.1   Cardiac Enzymes: No results for input(s): CKTOTAL, CKMB, CKMBINDEX, TROPONINI in the last 168 hours. BNP (last 3 results) No results for input(s): PROBNP in the last 8760 hours. HbA1C: Recent Labs    12/01/20 2031  HGBA1C 5.4   CBG: Recent Labs  Lab 12/01/20 1904 12/01/20 2313 12/02/20 0345 12/02/20 0756 12/02/20 0827  GLUCAP 79 167* 81 69* 96   Lipid Profile: No results for input(s): CHOL, HDL, LDLCALC, TRIG, CHOLHDL, LDLDIRECT in the last 72 hours. Thyroid Function Tests: No results for input(s): TSH, T4TOTAL, FREET4, T3FREE, THYROIDAB in the last 72 hours. Anemia Panel: No results for input(s): VITAMINB12, FOLATE, FERRITIN, TIBC, IRON, RETICCTPCT in the last 72 hours. Sepsis Labs: Recent Labs  Lab 12/01/20 1311 12/01/20 2031 12/02/20 0746  PROCALCITON 1.58  --  1.03  LATICACIDVEN 1.6 1.4  --     Recent Results (from the past 240 hour(s))  Urine Culture     Status: Abnormal   Collection Time: 12/01/20  1:11 PM   Specimen: Urine, Random  Result Value Ref Range Status   Specimen Description   Final    URINE, RANDOM Performed at [facility], [address], [place], [place] [address]    Special Requests   Final    NONE Performed at [facility], [address] [place], [place] [address]    Culture (A)  Final    <10,000 COLONIES/mL INSIGNIFICANT GROWTH Performed at [facility], [place] [address] [place], [place] [address]    Report Status 12/02/2020 FINAL  Final  Blood culture (routine single)     Status: None (Preliminary result)   Collection Time: 12/01/20  1:12 PM   Specimen: BLOOD  Result Value Ref Range Status   Specimen Description BLOOD BLOOD  LEFT HAND  Final   Special Requests   Final    BOTTLES DRAWN AEROBIC AND ANAEROBIC Blood Culture results may not be optimal due to an inadequate volume of blood received in culture bottles   Culture   Final    NO GROWTH < 24 HOURS Performed at [facility], [address] [place], [place] [address]    Report Status PENDING  Incomplete  Resp Panel by RT-PCR (Flu A&B, Covid) Nasopharyngeal Swab     Status: None   Collection Time: 12/01/20  2:04 PM   Specimen: Nasopharyngeal Swab; Nasopharyngeal(NP) swabs in vial transport medium  Result Value Ref Range Status   SARS Coronavirus 2 by RT PCR NEGATIVE NEGATIVE Final    Comment: (NOTE) SARS-CoV-2 target nucleic acids are NOT DETECTED.  The SARS-CoV-2 RNA is generally detectable in upper  respiratory specimens during the acute phase of infection. The lowest concentration of SARS-CoV-2 viral copies this assay can detect is 138 copies/mL. A negative result does not preclude SARS-Cov-2 infection and should not be used as the sole basis for treatment or other patient management decisions. A negative result may occur with  improper specimen collection/handling, submission of specimen other than nasopharyngeal swab, presence of viral mutation(s) within the areas targeted by this assay, and inadequate number of viral copies(<138 copies/mL). A negative result must be combined with clinical observations, patient history, and epidemiological information. The expected result is Negative.  Fact Sheet for Patients:  EntrepreneurPulse.com.au  Fact Sheet for Healthcare Providers:  IncredibleEmployment.be  This test is no t yet approved or cleared by the [place] FDA and  has been authorized for detection and/or diagnosis of SARS-CoV-2 by FDA under an Emergency Use Authorization (EUA). This EUA will remain  in effect (meaning this test can be used) for the duration of the COVID-19 declaration under  Section 564(b)(1) of the Act, 21 U.S.C.section 360bbb-3(b)(1), unless the authorization is terminated  or revoked sooner.       Influenza A by PCR NEGATIVE NEGATIVE Final   Influenza B by PCR NEGATIVE NEGATIVE Final    Comment: (NOTE) The Xpert Xpress SARS-CoV-2/FLU/RSV plus assay is intended as an aid in the diagnosis of influenza from Nasopharyngeal swab specimens and should not be used as a sole basis for treatment. Nasal washings and aspirates are unacceptable for Xpert Xpress SARS-CoV-2/FLU/RSV testing.  Fact Sheet for Patients: EntrepreneurPulse.com.au  Fact Sheet for Healthcare Providers: IncredibleEmployment.be  This test is not yet approved or cleared by the [place] FDA and has been authorized for detection and/or diagnosis of SARS-CoV-2 by FDA under an Emergency Use Authorization (EUA). This EUA will remain in effect (meaning this test can be used) for the duration of the COVID-19 declaration under Section 564(b)(1) of the Act, 21 U.S.C. section 360bbb-3(b)(1), unless the authorization is terminated or revoked.  Performed at [facility], [address] [place], [place] [address]   Culture, blood (single)     Status: None (Preliminary result)   Collection Time: 12/01/20  8:31 PM   Specimen: BLOOD  Result Value Ref Range Status   Specimen Description BLOOD BLOOD RIGHT HAND  Final   Special Requests   Final    BOTTLES DRAWN AEROBIC AND ANAEROBIC Blood Culture adequate volume   Culture   Final    NO GROWTH < 12 HOURS Performed at [facility], [address], [place], [place] [address]    Report Status PENDING  Incomplete      Radiology Studies: CT ABDOMEN PELVIS W CONTRAST  Result Date: 12/01/2020 CLINICAL DATA:  Lower abdominal pain for 24 hours EXAM: CT ABDOMEN AND PELVIS WITH CONTRAST TECHNIQUE: Multidetector CT imaging of the abdomen and pelvis was performed using the standard protocol following  bolus administration of intravenous contrast. CONTRAST:  42m OMNIPAQUE IOHEXOL 350 MG/ML SOLN COMPARISON:  11/04/2018 FINDINGS: Lower chest: No acute abnormality. Hepatobiliary: No solid liver abnormality is seen. No gallstones, gallbladder wall thickening, or biliary dilatation. Pancreas: Unremarkable. No pancreatic ductal dilatation or surrounding inflammatory changes. Spleen: Normal in size without significant abnormality. Adrenals/Urinary Tract: Adrenal glands are unremarkable. Kidneys are normal, without renal calculi, solid lesion, or hydronephrosis. Bladder is unremarkable. Stomach/Bowel: Stomach is within normal limits. Appendix appears normal. No evidence of bowel wall thickening, distention, or inflammatory changes. Pancolonic diverticulosis. Vascular/Lymphatic: Aortic atherosclerosis. No enlarged abdominal or pelvic lymph nodes. Reproductive: APharmacist, community  within the fundal endometrial cavity (series 6, image 86). There is a new, thickly septated right ovarian mass measuring 5.1 x 5.1 cm (series 2, image 64). Other: No abdominal wall hernia or abnormality. No abdominopelvic ascites. Musculoskeletal: No acute or significant osseous findings. IMPRESSION: 1. There is a new, thickly septated right ovarian mass measuring 5.1 x 5.1 cm. 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 Recommend initial pelvic ultrasound and likely subsequent MRI to further evaluate, which may be performed on a nonemergent basis. 2. Air within the fundal endometrial cavity. Correlate for recent instrumentation. 3. Pancolonic diverticulosis without evidence of acute diverticulitis. Aortic Atherosclerosis (ICD10-I70.0). Electronically Signed   By: Eddie Candle M.D.   On: 12/01/2020 15:48   DG Chest Port 1 View  Result Date: 12/01/2020 CLINICAL DATA:  Question sepsis.  Short of breath. EXAM: PORTABLE CHEST 1 VIEW COMPARISON:  11/04/2018 FINDINGS: Cardiac enlargement. Negative for heart failure or edema. Lungs are clear without infiltrate or  effusion. IMPRESSION: Cardiac enlargement without acute cardiopulmonary abnormality. Electronically Signed   By: Franchot Gallo M.D.   On: 12/01/2020 12:52   US PELVIC COMPLETE WITH TRANSVAGINAL  Result Date: 12/02/2020 CLINICAL DATA:  Pelvic pain and right ovarian mass seen on prior CT. EXAM: TRANSABDOMINAL AND TRANSVAGINAL ULTRASOUND OF PELVIS TECHNIQUE: Both transabdominal and transvaginal ultrasound examinations of the pelvis were performed. Transabdominal technique was performed for global imaging of the pelvis including uterus, ovaries, adnexal regions, and pelvic cul-de-sac. It was necessary to proceed with endovaginal exam following the transabdominal exam to visualize the adnexa. COMPARISON:  CT abdomen pelvis dated 12/01/2020. FINDINGS: The exam is limited due to overlying bowel gas and the patient's ability to tolerate the exam due to pain. Uterus Measurements: 7.5 x 3.2 x 4.2 cm = volume: 52 mL. No fibroids or other mass visualized. Endometrium Not well visualized. Right ovary Measurements: 5.2 x 4.8 x 5.0 cm = volume: 65 mL. A complex cystic mass with irregular septations measuring greater than 3 mm is difficult to differentiate from the normal ovarian tissue. A hypoechoic component of this mass measures 3.2 x 3.7 x 2.4 cm and may represent cystic components with debris versus solid tissue. Left ovary Not visualized. Other findings No abnormal free fluid. IMPRESSION: Right adnexal mass with irregular septations and possible solid component is worrisome for malignancy. Non emergent MR pelvis and surgical consultation should be considered for further evaluation. Electronically Signed   By: Zerita Boers M.D.   On: 12/02/2020 12:00     Scheduled Meds:  docusate sodium  100 mg Oral BID   hydrOXYzine  25 mg Oral Daily    morphine injection  2 mg Intravenous Once   oxybutynin  10 mg Oral QHS   pregabalin  75 mg Oral Daily   prenatal multivitamin  1 tablet Oral Q1200   sertraline  25 mg Oral Daily    simvastatin  20 mg Oral QPM   Continuous Infusions:  clindamycin (CLEOCIN) IV 900 mg (12/02/20 1403)     LOS: 1 day     Enzo Bi, MD [facility] If 7PM-7AM, please contact night-coverage 12/02/2020, 5:53 PM

## 2020-12-02 NOTE — Progress Notes (Signed)
Hypoglycemic Event  CBG: 69  Treatment: 8 oz juice/soda  Symptoms: None  Follow-up CBG: Time: 08:27 CBG Result:96  Possible Reasons for Event: Unknown  Comments/MD notified: Dr. Ouida Sills notified. Followed protocol.    Rebecca Parker

## 2020-12-02 NOTE — Progress Notes (Signed)
1 Day Post-Op Procedure(s) (LRB): DILATATION AND CURETTAGE with cervical biopsies (N/A) Pt admitted for pelvic pain and a complex right ovarian cyst . 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 Pain less .  Ambulating . Pt seen today .  Small po food . No emesis   Objective: I have reviewed patient's vital signs, medications, labs, microbiology, and radiology results. Results for orders placed or performed during the hospital encounter of 12/01/20 (from the past 24 hour(s))  Basic metabolic panel     Status: Abnormal   Collection Time: 12/01/20  1:11 PM  Result Value Ref Range   Sodium 136 135 - 145 mmol/L   Potassium 5.1 3.5 - 5.1 mmol/L   Chloride 103 98 - 111 mmol/L   CO2 24 22 - 32 mmol/L   Glucose, Bld 73 70 - 99 mg/dL   BUN 22 8 - 23 mg/dL   Creatinine, Ser 1.24 (H) 0.44 - 1.00 mg/dL   Calcium 8.3 (L) 8.9 - 10.3 mg/dL   GFR, Estimated 46 (L) >60 mL/min   Anion gap 9 5 - 15  CBC     Status: Abnormal   Collection Time: 12/01/20  1:11 PM  Result Value Ref Range   WBC 23.3 (H) 4.0 - 10.5 K/uL   RBC 3.17 (L) 3.87 - 5.11 MIL/uL   Hemoglobin 11.0 (L) 12.0 - 15.0 g/dL   HCT 31.3 (L) 36.0 - 46.0 %   MCV 98.7 80.0 - 100.0 fL   MCH 34.7 (H) 26.0 - 34.0 pg   MCHC 35.1 30.0 - 36.0 g/dL   RDW 21.7 (H) 11.5 - 15.5 %   Platelets 426 (H) 150 - 400 K/uL   nRBC 0.0 0.0 - 0.2 %  Urinalysis, Complete w Microscopic     Status: Abnormal   Collection Time: 12/01/20  1:11 PM  Result Value Ref Range   Color, Urine YELLOW (A) YELLOW   APPearance CLOUDY (A) CLEAR   Specific Gravity, Urine 1.012 1.005 - 1.030   pH 5.0 5.0 - 8.0   Glucose, UA NEGATIVE NEGATIVE mg/dL   Hgb urine dipstick SMALL (A) NEGATIVE   Bilirubin Urine NEGATIVE NEGATIVE   Ketones, ur NEGATIVE NEGATIVE mg/dL   Protein, ur NEGATIVE NEGATIVE mg/dL   Nitrite NEGATIVE NEGATIVE   Leukocytes,Ua LARGE (A) NEGATIVE   RBC / HPF 6-10 0 - 5 RBC/hpf   WBC, UA >50 (H) 0 - 5  WBC/hpf   Bacteria, UA RARE (A) NONE SEEN   Squamous Epithelial / LPF 0-5 0 - 5   WBC Clumps PRESENT    Mucus PRESENT    Hyaline Casts, UA PRESENT   Lactic acid, plasma     Status: None   Collection Time: 12/01/20  1:11 PM  Result Value Ref Range   Lactic Acid, Venous 1.6 0.5 - 1.9 mmol/L  Protime-INR     Status: None   Collection Time: 12/01/20  1:11 PM  Result Value Ref Range   Prothrombin Time 14.6 11.4 - 15.2 seconds   INR 1.1 0.8 - 1.2  APTT     Status: Abnormal   Collection Time: 12/01/20  1:11 PM  Result Value Ref Range   aPTT 39 (H) 24 - 36 seconds  Lipase, blood     Status: None   Collection Time: 12/01/20  1:11 PM  Result Value Ref Range   Lipase 37 11 - 51 U/L  Troponin I (High Sensitivity)  Status: None   Collection Time: 12/01/20  1:11 PM  Result Value Ref Range   Troponin I (High Sensitivity) 17 <18 ng/L  Procalcitonin - Baseline     Status: None   Collection Time: 12/01/20  1:11 PM  Result Value Ref Range   Procalcitonin 1.58 ng/mL  Blood culture (routine single)     Status: None (Preliminary result)   Collection Time: 12/01/20  1:12 PM   Specimen: BLOOD  Result Value Ref Range   Specimen Description BLOOD BLOOD LEFT HAND    Special Requests      BOTTLES DRAWN AEROBIC AND ANAEROBIC Blood Culture results may not be optimal due to an inadequate volume of blood received in culture bottles   Culture      NO GROWTH < 24 HOURS Performed at [facility], [address] [place], [place] [address]    Report Status PENDING   Resp Panel by RT-PCR (Flu A&B, Covid) Nasopharyngeal Swab     Status: None   Collection Time: 12/01/20  2:04 PM   Specimen: Nasopharyngeal Swab; Nasopharyngeal(NP) swabs in vial transport medium  Result Value Ref Range   SARS Coronavirus 2 by RT PCR NEGATIVE NEGATIVE   Influenza A by PCR NEGATIVE NEGATIVE   Influenza B by PCR NEGATIVE NEGATIVE  CBG monitoring, ED     Status: None   Collection Time: 12/01/20  5:30 PM  Result  Value Ref Range   Glucose-Capillary 80 70 - 99 mg/dL  POC CBG, ED     Status: None   Collection Time: 12/01/20  5:53 PM  Result Value Ref Range   Glucose-Capillary 76 70 - 99 mg/dL  ABO/Rh     Status: None   Collection Time: 12/01/20  5:53 PM  Result Value Ref Range   ABO/RH(D)      A POS Performed at [facility], [address] [place], [place] [address]   CBG monitoring, ED     Status: None   Collection Time: 12/01/20  7:04 PM  Result Value Ref Range   Glucose-Capillary 79 70 - 99 mg/dL  Lactic acid, plasma     Status: None   Collection Time: 12/01/20  8:31 PM  Result Value Ref Range   Lactic Acid, Venous 1.4 0.5 - 1.9 mmol/L  Culture, blood (single)     Status: None (Preliminary result)   Collection Time: 12/01/20  8:31 PM   Specimen: BLOOD  Result Value Ref Range   Specimen Description BLOOD BLOOD RIGHT HAND    Special Requests      BOTTLES DRAWN AEROBIC AND ANAEROBIC Blood Culture adequate volume   Culture      NO GROWTH < 12 HOURS Performed at [facility], [address] [place], [place] [address]    Report Status PENDING   Type and screen Ordered by ORDER LAB     Status: None   Collection Time: 12/01/20  8:31 PM  Result Value Ref Range   ABO/RH(D) A POS    Antibody Screen NEG    Sample Expiration      12/04/2020,2359 Performed at [facility], [address] [place], [place] [address]   Hemoglobin A1c     Status: None   Collection Time: 12/01/20  8:31 PM  Result Value Ref Range   Hgb A1c MFr Bld 5.4 4.8 - 5.6 %   Mean Plasma Glucose 108.28 mg/dL  Brain natriuretic peptide     Status: None   Collection Time: 12/01/20  8:31 PM  Result Value Ref Range   B Natriuretic Peptide 64.8 0.0 - 100.0 pg/mL  Glucose, capillary     Status: Abnormal   Collection Time: 12/01/20 11:13 PM  Result Value Ref Range   Glucose-Capillary 167 (H) 70 - 99 mg/dL  Glucose, capillary     Status: None   Collection Time: 12/02/20  3:45 AM  Result  Value Ref Range   Glucose-Capillary 81 70 - 99 mg/dL  Procalcitonin     Status: None   Collection Time: 12/02/20  7:46 AM  Result Value Ref Range   Procalcitonin 1.03 ng/mL  Comprehensive metabolic panel     Status: Abnormal   Collection Time: 12/02/20  7:46 AM  Result Value Ref Range   Sodium 138 135 - 145 mmol/L   Potassium 4.7 3.5 - 5.1 mmol/L   Chloride 106 98 - 111 mmol/L   CO2 24 22 - 32 mmol/L   Glucose, Bld 80 70 - 99 mg/dL   BUN 18 8 - 23 mg/dL   Creatinine, Ser 1.07 (H) 0.44 - 1.00 mg/dL   Calcium 7.9 (L) 8.9 - 10.3 mg/dL   Total Protein 6.5 6.5 - 8.1 g/dL   Albumin 1.8 (L) 3.5 - 5.0 g/dL   AST 17 15 - 41 U/L   ALT 10 0 - 44 U/L   Alkaline Phosphatase 74 38 - 126 U/L   Total Bilirubin 0.5 0.3 - 1.2 mg/dL   GFR, Estimated 55 (L) >60 mL/min   Anion gap 8 5 - 15  CBC     Status: Abnormal   Collection Time: 12/02/20  7:46 AM  Result Value Ref Range   WBC 21.7 (H) 4.0 - 10.5 K/uL   RBC 2.83 (L) 3.87 - 5.11 MIL/uL   Hemoglobin 9.6 (L) 12.0 - 15.0 g/dL   HCT 28.0 (L) 36.0 - 46.0 %   MCV 98.9 80.0 - 100.0 fL   MCH 33.9 26.0 - 34.0 pg   MCHC 34.3 30.0 - 36.0 g/dL   RDW 21.0 (H) 11.5 - 15.5 %   Platelets 413 (H) 150 - 400 K/uL   nRBC 0.0 0.0 - 0.2 %  Glucose, capillary     Status: Abnormal   Collection Time: 12/02/20  7:56 AM  Result Value Ref Range   Glucose-Capillary 69 (L) 70 - 99 mg/dL  Glucose, capillary     Status: None   Collection Time: 12/02/20  8:27 AM  Result Value Ref Range   Glucose-Capillary 96 70 - 99 mg/dL  Gentamicin level, random     Status: None   Collection Time: 12/02/20 10:10 AM  Result Value Ref Range   Gentamicin Rm 7.1 ug/mL   U/s : TVUS  EXAM: TRANSABDOMINAL AND TRANSVAGINAL ULTRASOUND OF PELVIS   TECHNIQUE: Both transabdominal and transvaginal ultrasound examinations of the pelvis were performed. Transabdominal technique was performed for global imaging of the pelvis including uterus, ovaries, adnexal regions, and pelvic cul-de-sac.  It was necessary to proceed with endovaginal exam following the transabdominal exam to visualize the adnexa.   COMPARISON:  CT abdomen pelvis dated 12/01/2020.   FINDINGS: The exam is limited due to overlying bowel gas and the patient's ability to tolerate the exam due to pain.   Uterus   Measurements: 7.5 x 3.2 x 4.2 cm = volume: 52 mL. No fibroids or other mass visualized.   Endometrium   Not well visualized.   Right ovary   Measurements: 5.2 x 4.8 x 5.0 cm = volume: 65 mL. A complex cystic  mass with irregular septations measuring greater than 3 mm is difficult to differentiate from the normal ovarian tissue. A hypoechoic component of this mass measures 3.2 x 3.7 x 2.4 cm and may represent cystic components with debris versus solid tissue.   Left ovary   Not visualized.   Other findings   No abnormal free fluid. Physical exam : Lungs CTA  Cv RRR  Abdomen mild TTP , no rebound Assessment:/ Plan  s/p Procedure(s): DILATATION AND CURETTAGE with cervical biopsies (N/A):  Leukocytosis- on Gent /Cleo with decreasing WBC . Culture pending Urinary source vs pyometrium secondary to presume endometrial cancer Continue ABX and repeat labs in am Complex right ovarian mass. Check CA -125 level  Blood glucose levels stable  Check HIV /  status given history IV drug use   Gwen Her Tondra Reierson 12/02/2020, 12:55 PM

## 2020-12-02 NOTE — Progress Notes (Signed)
Pt. With c/o food not being seasoned and cold. I warmed food in microwave and provided butter as per Pt. Request. I also provided her with Sandwich Tray. Pt. Also instructed in Fall Prevention and I asked her to call for assist when up out of Bed and she stated she has been getting  up by herself. Pt. Also stated she, "needs sleep." And that she may "lock" room door. I gently reeducated pt. In risk of injury with fall and instructed her she would not be disturbed until medications due at 2200. Pt. V/o. Will cont. To respect sleep by clustering care and reeducating on calling for assistance when up out of bed.

## 2020-12-03 LAB — PROCALCITONIN: Procalcitonin: 0.56 ng/mL

## 2020-12-03 LAB — CBC WITH DIFFERENTIAL/PLATELET
Abs Immature Granulocytes: 0.12 10*3/uL — ABNORMAL HIGH (ref 0.00–0.07)
Basophils Absolute: 0 10*3/uL (ref 0.0–0.1)
Basophils Relative: 0 %
Eosinophils Absolute: 0.3 10*3/uL (ref 0.0–0.5)
Eosinophils Relative: 2 %
HCT: 25.8 % — ABNORMAL LOW (ref 36.0–46.0)
Hemoglobin: 8.9 g/dL — ABNORMAL LOW (ref 12.0–15.0)
Immature Granulocytes: 1 %
Lymphocytes Relative: 18 %
Lymphs Abs: 2.6 10*3/uL (ref 0.7–4.0)
MCH: 33.6 pg (ref 26.0–34.0)
MCHC: 34.5 g/dL (ref 30.0–36.0)
MCV: 97.4 fL (ref 80.0–100.0)
Monocytes Absolute: 1.1 10*3/uL — ABNORMAL HIGH (ref 0.1–1.0)
Monocytes Relative: 8 %
Neutro Abs: 10.3 10*3/uL — ABNORMAL HIGH (ref 1.7–7.7)
Neutrophils Relative %: 71 %
Platelets: 391 10*3/uL (ref 150–400)
RBC: 2.65 MIL/uL — ABNORMAL LOW (ref 3.87–5.11)
RDW: 20.5 % — ABNORMAL HIGH (ref 11.5–15.5)
WBC: 14.5 10*3/uL — ABNORMAL HIGH (ref 4.0–10.5)
nRBC: 0 % (ref 0.0–0.2)

## 2020-12-03 LAB — HIV ANTIBODY (ROUTINE TESTING W REFLEX): HIV Screen 4th Generation wRfx: NONREACTIVE

## 2020-12-03 LAB — BASIC METABOLIC PANEL
Anion gap: 4 — ABNORMAL LOW (ref 5–15)
BUN: 17 mg/dL (ref 8–23)
CO2: 27 mmol/L (ref 22–32)
Calcium: 7.9 mg/dL — ABNORMAL LOW (ref 8.9–10.3)
Chloride: 107 mmol/L (ref 98–111)
Creatinine, Ser: 1.28 mg/dL — ABNORMAL HIGH (ref 0.44–1.00)
GFR, Estimated: 45 mL/min — ABNORMAL LOW (ref >60)
Glucose, Bld: 89 mg/dL (ref 70–99)
Potassium: 4.2 mmol/L (ref 3.5–5.1)
Sodium: 138 mmol/L (ref 135–145)

## 2020-12-03 LAB — MAGNESIUM: Magnesium: 1.9 mg/dL (ref 1.7–2.4)

## 2020-12-03 LAB — CA 125: Cancer Antigen (CA) 125: 27.4 U/mL (ref 0.0–38.1)

## 2020-12-03 MED ORDER — GENTAMICIN SULFATE 40 MG/ML IJ SOLN
5.0000 mg/kg | Freq: Once | INTRAVENOUS | Status: DC
  Filled 2020-12-03: qty 7

## 2020-12-03 MED ORDER — AMOXICILLIN-POT CLAVULANATE 875-125 MG PO TABS
1.0000 | ORAL_TABLET | Freq: Two times a day (BID) | ORAL | Status: DC
  Administered 2020-12-03 – 2020-12-05 (×5): 1 via ORAL
  Filled 2020-12-03 (×6): qty 1

## 2020-12-03 MED ORDER — OXYCODONE HCL 5 MG PO TABS
5.0000 mg | ORAL_TABLET | Freq: Three times a day (TID) | ORAL | Status: DC | PRN
  Administered 2020-12-03 – 2020-12-04 (×3): 5 mg via ORAL
  Filled 2020-12-03 (×3): qty 1

## 2020-12-03 MED ORDER — FERROUS SULFATE 325 (65 FE) MG PO TABS
325.0000 mg | ORAL_TABLET | Freq: Two times a day (BID) | ORAL | Status: DC
  Administered 2020-12-03 – 2020-12-05 (×4): 325 mg via ORAL
  Filled 2020-12-03 (×4): qty 1

## 2020-12-03 MED ORDER — METRONIDAZOLE 500 MG PO TABS
500.0000 mg | ORAL_TABLET | Freq: Three times a day (TID) | ORAL | Status: DC
  Administered 2020-12-03 – 2020-12-05 (×6): 500 mg via ORAL
  Filled 2020-12-03 (×8): qty 1

## 2020-12-03 MED ORDER — SODIUM CHLORIDE 0.9 % IV SOLN: INTRAVENOUS | Status: DC

## 2020-12-03 MED ORDER — GENTAMICIN SULFATE 40 MG/ML IJ SOLN
5.0000 mg/kg | Freq: Once | INTRAVENOUS | Status: DC
  Filled 2020-12-03: qty 8.5

## 2020-12-03 NOTE — Progress Notes (Signed)
2 Days Post-Op Procedure(s) (LRB): DILATATION AND CURETTAGE with cervical biopsies (N/A) Pyometrium , tissue removed concerning for endometrial cancer Complex right ovarian cyst  UTI  Elevated creatinine Pelvic pain  + cocaine on UDS  Subjective: Pt still complaining of right lower abdominal/ pelvic pain  Small po intake . Some flatus , no BM  Objective: I have reviewed patient's vital signs, medications, and labs. Results for orders placed or performed during the hospital encounter of 12/01/20 (from the past 24 hour(s))  Urine Drug Screen, Qualitative ([place] only)     Status: Abnormal   Collection Time: 12/02/20 10:51 PM  Result Value Ref Range   Tricyclic, Ur Screen NONE DETECTED NONE DETECTED   Amphetamines, Ur Screen NONE DETECTED NONE DETECTED   MDMA (Ecstasy)Ur Screen NONE DETECTED NONE DETECTED   Cocaine Metabolite,Ur [place] POSITIVE (A) NONE DETECTED   Opiate, Ur Screen NONE DETECTED NONE DETECTED   Phencyclidine (PCP) Ur S NONE DETECTED NONE DETECTED   Cannabinoid 50 Ng, Ur [place] NONE DETECTED NONE DETECTED   Barbiturates, Ur Screen NONE DETECTED NONE DETECTED   Benzodiazepine, Ur Scrn NONE DETECTED NONE DETECTED   Methadone Scn, Ur NONE DETECTED NONE DETECTED  Procalcitonin     Status: None   Collection Time: 12/03/20  6:36 AM  Result Value Ref Range   Procalcitonin 0.56 ng/mL  CBC with Differential/Platelet     Status: Abnormal   Collection Time: 12/03/20  6:36 AM  Result Value Ref Range   WBC 14.5 (H) 4.0 - 10.5 K/uL   RBC 2.65 (L) 3.87 - 5.11 MIL/uL   Hemoglobin 8.9 (L) 12.0 - 15.0 g/dL   HCT 25.8 (L) 36.0 - 46.0 %   MCV 97.4 80.0 - 100.0 fL   MCH 33.6 26.0 - 34.0 pg   MCHC 34.5 30.0 - 36.0 g/dL   RDW 20.5 (H) 11.5 - 15.5 %   Platelets 391 150 - 400 K/uL   nRBC 0.0 0.0 - 0.2 %   Neutrophils Relative % 71 %   Neutro Abs 10.3 (H) 1.7 - 7.7 K/uL   Lymphocytes Relative 18 %   Lymphs Abs 2.6 0.7 - 4.0 K/uL   Monocytes Relative 8 %   Monocytes Absolute 1.1 (H) 0.1 -  1.0 K/uL   Eosinophils Relative 2 %   Eosinophils Absolute 0.3 0.0 - 0.5 K/uL   Basophils Relative 0 %   Basophils Absolute 0.0 0.0 - 0.1 K/uL   Immature Granulocytes 1 %   Abs Immature Granulocytes 0.12 (H) 0.00 - 0.07 K/uL  Basic metabolic panel     Status: Abnormal   Collection Time: 12/03/20  6:36 AM  Result Value Ref Range   Sodium 138 135 - 145 mmol/L   Potassium 4.2 3.5 - 5.1 mmol/L   Chloride 107 98 - 111 mmol/L   CO2 27 22 - 32 mmol/L   Glucose, Bld 89 70 - 99 mg/dL   BUN 17 8 - 23 mg/dL   Creatinine, Ser 1.28 (H) 0.44 - 1.00 mg/dL   Calcium 7.9 (L) 8.9 - 10.3 mg/dL   GFR, Estimated 45 (L) >60 mL/min   Anion gap 4 (L) 5 - 15  Magnesium     Status: None   Collection Time: 12/03/20  6:36 AM  Result Value Ref Range   Magnesium 1.9 1.7 - 2.4 mg/dL    General: alert and cooperative Resp: clear to auscultation bilaterally Cardio: regular rate and rhythm, S1, S2 normal, no murmur, click, rub or gallop GI: soft non distended . +  BS , mild TTP right > left , no rebound   Assessment:/ Plan  Pyometrium ,probable endometrial cancer ,  WBC decreasing on Gent / Cleocin - continue  Pelvic pain still present , possible secondary to Ovarian cyst vs endometrial infection .  Elevated Cr mild  Cocaine use Anemia , add iron sulfate  Repeat labs in am . Awaiting pathology results for final disposition   LOS: 2 days    Gwen Her Najae Filsaime 12/03/2020, 12:13 PM

## 2020-12-03 NOTE — Progress Notes (Signed)
MD notified of loss of IV access and unsuccessful attempts. IV medications changed to PO.

## 2020-12-03 NOTE — Progress Notes (Signed)
A consult was placed to the IV Therapist for a new IV site;  both arms assessed w ultrasound; attempted x 2 but unable to thread catheters; pt is anxious, and veins are really small;  Deneise Lever, RN , at bedside during IV attempts;  pt remains without access at present.

## 2020-12-03 NOTE — Progress Notes (Addendum)
Consult Daily PROGRESS NOTE    Rebecca Parker  [id number] DOB: 23-Feb-1951 DOA: 12/01/2020 PCP: [facility] [place]  341A/341A-AA   Assessment & Plan:   Principal Problem:   Sepsis [facility]) Active Problems:   Benign essential hypertension   Chronic diastolic CHF (congestive heart failure), NYHA class 2 ([place])   Type 2 diabetes mellitus without complications (HCC)   Ovarian mass, right   Acute lower UTI   Pelvic pain    Rebecca Parker is a 70 y.o. female with medical history significant for diabetes mellitus and chronic diastolic dysfunction CHF who presents to the ER from her primary care provider's office for evaluation of abdominal pain and hypotension. Patient complains of diffuse abdominal pain which started on the day of mostly in the lower abdominal area.  She describes the pain as bandlike extending from right lower quadrant to the left lower quadrant.  She rates her pain a 10 x 10 in intensity at its worst and radiating.  Abdominal pain is associated with constipation but she denies having any nausea or vomiting.  She was seen at her doctor's office and was noted to be hypotensive with systolic blood pressure in the 70s.  She was sent to the ER via EMS for further evaluation.  Sepsis, ruled out --did not meet SIR criteria, only had leukocytosis on presentation.  Pyometrium and endometritis S/p DILATATION AND CURETTAGE --purulent vaginal discharge and imaging shows air within the fundal endometrial cavity.  --concern for endometrial cancer. --received empiric antibiotic therapy with cefepime in the ER as well as IV fluid resuscitation. Plan: --abx transitioned to Augmentin and Flagyl, per Obgyn  Hypotension --Patient noted to be hypotensive upon arrival to the ER.  BP has been wnl since. --hold HCTZ, cozaar and lasix  Hx of Diabetes mellitus, not currently active Hypoglycemia due to excessive insulin --takes only metformin at home --A1c 5.4 --no need  for SSI --d/c metformin at discharge.  Ovarian mass Patient noted to have a thickly septated right ovarian mass measuring 5.9 x 5.1 cm.  Findings are concerning for ovarian neoplasm --management per ObGyn  Chronic diastolic dysfunction CHF Not acutely exacerbated --hold lasix for now  Depression Continue sertraline  Cocaine use --UDS pos for cocaine  Addendum:  currently no active issues that require Medicine Service.  Hospitalist will sign off, ObGyn agrees.     DVT prophylaxis: SCD/Compression stockings Code Status: Full code  Family Communication:  Level of care: Postpartum Dispo:  per primary team   Subjective and Interval History:  No abdominal pain.  Per labs and symptoms, infection resolving.   Objective: Vitals:   12/03/20 0344 12/03/20 0549 12/03/20 0752 12/03/20 1251  BP: (!) 106/54 115/76 105/66 124/84  Pulse: 66 71 61 69  Resp: '18 20 16 18  '$ Temp: 97.7 F (36.5 C)  98 F (36.7 C) 97.7 F (36.5 C)  TempSrc: Oral  Oral Oral  SpO2: 100% 100% 100%   Weight:      Height:        Intake/Output Summary (Last 24 hours) at 12/03/2020 1828 Last data filed at 12/03/2020 1813 Gross per 24 hour  Intake 225.06 ml  Output 1650 ml  Net -1424.94 ml   Filed Weights   12/01/20 1113  Weight: 67.6 kg    Examination:   Constitutional: NAD, alert, oriented, sitting at side of bed HEENT: conjunctivae and lids normal, EOMI CV: No cyanosis.   RESP: normal respiratory effort, on RA Extremities: No effusions, edema in BLE  SKIN: warm, dry Neuro: II - XII grossly intact.     Data Reviewed: I have personally reviewed following labs and imaging studies  CBC: Recent Labs  Lab 12/01/20 1311 12/02/20 0746 12/03/20 0636  WBC 23.3* 21.7* 14.5*  NEUTROABS  --   --  10.3*  HGB 11.0* 9.6* 8.9*  HCT 31.3* 28.0* 25.8*  MCV 98.7 98.9 97.4  PLT 426* 413* 0000000   Basic Metabolic Panel: Recent Labs  Lab 12/01/20 1311 12/02/20 0746 12/03/20 0636  NA 136 138 138  K 5.1  4.7 4.2  CL 103 106 107  CO2 '24 24 27  '$ GLUCOSE 73 80 89  BUN '22 18 17  '$ CREATININE 1.24* 1.07* 1.28*  CALCIUM 8.3* 7.9* 7.9*  MG  --   --  1.9   GFR: Estimated Creatinine Clearance: 35.4 mL/min (A) (by C-G formula based on SCr of 1.28 mg/dL (H)). Liver Function Tests: Recent Labs  Lab 12/02/20 0746  AST 17  ALT 10  ALKPHOS 74  BILITOT 0.5  PROT 6.5  ALBUMIN 1.8*   Recent Labs  Lab 12/01/20 1311  LIPASE 37   No results for input(s): AMMONIA in the last 168 hours. Coagulation Profile: Recent Labs  Lab 12/01/20 1311  INR 1.1   Cardiac Enzymes: No results for input(s): CKTOTAL, CKMB, CKMBINDEX, TROPONINI in the last 168 hours. BNP (last 3 results) No results for input(s): PROBNP in the last 8760 hours. HbA1C: Recent Labs    12/01/20 2031  HGBA1C 5.4   CBG: Recent Labs  Lab 12/01/20 1904 12/01/20 2313 12/02/20 0345 12/02/20 0756 12/02/20 0827  GLUCAP 79 167* 81 69* 96   Lipid Profile: No results for input(s): CHOL, HDL, LDLCALC, TRIG, CHOLHDL, LDLDIRECT in the last 72 hours. Thyroid Function Tests: No results for input(s): TSH, T4TOTAL, FREET4, T3FREE, THYROIDAB in the last 72 hours. Anemia Panel: No results for input(s): VITAMINB12, FOLATE, FERRITIN, TIBC, IRON, RETICCTPCT in the last 72 hours. Sepsis Labs: Recent Labs  Lab 12/01/20 1311 12/01/20 2031 12/02/20 0746 12/03/20 0636  PROCALCITON 1.58  --  1.03 0.56  LATICACIDVEN 1.6 1.4  --   --     Recent Results (from the past 240 hour(s))  Urine Culture     Status: Abnormal   Collection Time: 12/01/20  1:11 PM   Specimen: Urine, Random  Result Value Ref Range Status   Specimen Description   Final    URINE, RANDOM Performed at [facility], [address] [place], [place] [address]    Special Requests   Final    NONE Performed at [facility], [address] [place], [place] [address]    Culture (A)  Final    <10,000 COLONIES/mL INSIGNIFICANT GROWTH Performed at  [facility], [place] [address] [place], [place] [address]    Report Status 12/02/2020 FINAL  Final  Blood culture (routine single)     Status: None (Preliminary result)   Collection Time: 12/01/20  1:12 PM   Specimen: BLOOD  Result Value Ref Range Status   Specimen Description BLOOD BLOOD LEFT HAND  Final   Special Requests   Final    BOTTLES DRAWN AEROBIC AND ANAEROBIC Blood Culture results may not be optimal due to an inadequate volume of blood received in culture bottles   Culture   Final    NO GROWTH 2 DAYS Performed at [facility], [address] [place], [place] [address]    Report Status PENDING  Incomplete  Resp Panel by  RT-PCR (Flu A&B, Covid) Nasopharyngeal Swab     Status: None   Collection Time: 12/01/20  2:04 PM   Specimen: Nasopharyngeal Swab; Nasopharyngeal(NP) swabs in vial transport medium  Result Value Ref Range Status   SARS Coronavirus 2 by RT PCR NEGATIVE NEGATIVE Final    Comment: (NOTE) SARS-CoV-2 target nucleic acids are NOT DETECTED.  The SARS-CoV-2 RNA is generally detectable in upper respiratory specimens during the acute phase of infection. The lowest concentration of SARS-CoV-2 viral copies this assay can detect is 138 copies/mL. A negative result does not preclude SARS-Cov-2 infection and should not be used as the sole basis for treatment or other patient management decisions. A negative result may occur with  improper specimen collection/handling, submission of specimen other than nasopharyngeal swab, presence of viral mutation(s) within the areas targeted by this assay, and inadequate number of viral copies(<138 copies/mL). A negative result must be combined with clinical observations, patient history, and epidemiological information. The expected result is Negative.  Fact Sheet for Patients:  EntrepreneurPulse.com.au  Fact Sheet for Healthcare Providers:  IncredibleEmployment.be  This test  is no t yet approved or cleared by the [place] FDA and  has been authorized for detection and/or diagnosis of SARS-CoV-2 by FDA under an Emergency Use Authorization (EUA). This EUA will remain  in effect (meaning this test can be used) for the duration of the COVID-19 declaration under Section 564(b)(1) of the Act, 21 U.S.C.section 360bbb-3(b)(1), unless the authorization is terminated  or revoked sooner.       Influenza A by PCR NEGATIVE NEGATIVE Final   Influenza B by PCR NEGATIVE NEGATIVE Final    Comment: (NOTE) The Xpert Xpress SARS-CoV-2/FLU/RSV plus assay is intended as an aid in the diagnosis of influenza from Nasopharyngeal swab specimens and should not be used as a sole basis for treatment. Nasal washings and aspirates are unacceptable for Xpert Xpress SARS-CoV-2/FLU/RSV testing.  Fact Sheet for Patients: EntrepreneurPulse.com.au  Fact Sheet for Healthcare Providers: IncredibleEmployment.be  This test is not yet approved or cleared by the [place] FDA and has been authorized for detection and/or diagnosis of SARS-CoV-2 by FDA under an Emergency Use Authorization (EUA). This EUA will remain in effect (meaning this test can be used) for the duration of the COVID-19 declaration under Section 564(b)(1) of the Act, 21 U.S.C. section 360bbb-3(b)(1), unless the authorization is terminated or revoked.  Performed at [facility], [address] [place], [place] [address]   Culture, blood (single)     Status: None (Preliminary result)   Collection Time: 12/01/20  8:31 PM   Specimen: BLOOD  Result Value Ref Range Status   Specimen Description BLOOD BLOOD RIGHT HAND  Final   Special Requests   Final    BOTTLES DRAWN AEROBIC AND ANAEROBIC Blood Culture adequate volume   Culture   Final    NO GROWTH 2 DAYS Performed at [facility], [address], [place], [place] [address]    Report Status PENDING   Incomplete      Radiology Studies: US PELVIC COMPLETE WITH TRANSVAGINAL  Result Date: 12/02/2020 CLINICAL DATA:  Pelvic pain and right ovarian mass seen on prior CT. EXAM: TRANSABDOMINAL AND TRANSVAGINAL ULTRASOUND OF PELVIS TECHNIQUE: Both transabdominal and transvaginal ultrasound examinations of the pelvis were performed. Transabdominal technique was performed for global imaging of the pelvis including uterus, ovaries, adnexal regions, and pelvic cul-de-sac. It was necessary to proceed with endovaginal exam following the transabdominal exam to visualize the adnexa. COMPARISON:  CT abdomen  pelvis dated 12/01/2020. FINDINGS: The exam is limited due to overlying bowel gas and the patient's ability to tolerate the exam due to pain. Uterus Measurements: 7.5 x 3.2 x 4.2 cm = volume: 52 mL. No fibroids or other mass visualized. Endometrium Not well visualized. Right ovary Measurements: 5.2 x 4.8 x 5.0 cm = volume: 65 mL. A complex cystic mass with irregular septations measuring greater than 3 mm is difficult to differentiate from the normal ovarian tissue. A hypoechoic component of this mass measures 3.2 x 3.7 x 2.4 cm and may represent cystic components with debris versus solid tissue. Left ovary Not visualized. Other findings No abnormal free fluid. IMPRESSION: Right adnexal mass with irregular septations and possible solid component is worrisome for malignancy. Non emergent MR pelvis and surgical consultation should be considered for further evaluation. Electronically Signed   By: Zerita Boers M.D.   On: 12/02/2020 12:00     Scheduled Meds:  amoxicillin-clavulanate  1 tablet Oral Q12H   docusate sodium  100 mg Oral BID   ferrous sulfate  325 mg Oral BID WC   hydrOXYzine  25 mg Oral Daily   metroNIDAZOLE  500 mg Oral Q8H    morphine injection  2 mg Intravenous Once   oxybutynin  10 mg Oral QHS   pregabalin  75 mg Oral Daily   prenatal multivitamin  1 tablet Oral Q1200   sertraline  25 mg Oral Daily    simvastatin  20 mg Oral QPM   Continuous Infusions:     LOS: 2 days     Enzo Bi, MD [facility] If 7PM-7AM, please contact night-coverage 12/03/2020, 6:28 PM

## 2020-12-03 NOTE — Progress Notes (Signed)
Pharmacy Antibiotic Note  Rebecca Parker is a 70 y.o. female admitted on 12/01/2020. Concern for endometritis with plan for D+C 8/5. Pharmacy has been consulted for gentamicin dosing for endometritis.  Plan: Gentamicin 5 mg/kg at 2200  On clindamycin.  Discussed with Dr. Ouida Sills 8/6. Expect patient will be on gentamicin for at least a few days. Will continue to follow along.  Height: 5' 1.5" (156.2 cm) Weight: 67.6 kg (149 lb) IBW/kg (Calculated) : 48.95  Temp (24hrs), Avg:97.8 F (36.6 C), Min:97.7 F (36.5 C), Max:98 F (36.7 C)  Recent Labs  Lab 12/01/20 1311 12/01/20 2031 12/02/20 0746 12/02/20 1010 12/03/20 0636  WBC 23.3*  --  21.7*  --  14.5*  CREATININE 1.24*  --  1.07*  --  1.28*  LATICACIDVEN 1.6 1.4  --   --   --   GENTRANDOM  --   --   --  7.1  --      Estimated Creatinine Clearance: 35.4 mL/min (A) (by C-G formula based on SCr of 1.28 mg/dL (H)).    Allergies  Allergen Reactions   Ace Inhibitors Itching    Rash   Cyclobenzaprine Itching    Rash and 'made me nervous'    Antimicrobials this admission: Cefepime 8/5 x 1 Clindamycin 8/5 >> Gentamicin 8/5 >>  Microbiology results: 8/5 BCx: NG 8/5 UCx: insignificant growth    Thank you for allowing pharmacy to be a part of this patient's care.  Tawnya Crook, PharmD, BCPS 12/03/2020 11:16 AM

## 2020-12-04 LAB — CBC
HCT: 24.6 % — ABNORMAL LOW (ref 36.0–46.0)
Hemoglobin: 8.6 g/dL — ABNORMAL LOW (ref 12.0–15.0)
MCH: 33.9 pg (ref 26.0–34.0)
MCHC: 35 g/dL (ref 30.0–36.0)
MCV: 96.9 fL (ref 80.0–100.0)
Platelets: 390 10*3/uL (ref 150–400)
RBC: 2.54 MIL/uL — ABNORMAL LOW (ref 3.87–5.11)
RDW: 20.7 % — ABNORMAL HIGH (ref 11.5–15.5)
WBC: 14.2 10*3/uL — ABNORMAL HIGH (ref 4.0–10.5)
nRBC: 0 % (ref 0.0–0.2)

## 2020-12-04 LAB — BASIC METABOLIC PANEL
Anion gap: 3 — ABNORMAL LOW (ref 5–15)
BUN: 14 mg/dL (ref 8–23)
CO2: 26 mmol/L (ref 22–32)
Calcium: 7.8 mg/dL — ABNORMAL LOW (ref 8.9–10.3)
Chloride: 110 mmol/L (ref 98–111)
Creatinine, Ser: 1.07 mg/dL — ABNORMAL HIGH (ref 0.44–1.00)
GFR, Estimated: 55 mL/min — ABNORMAL LOW (ref >60)
Glucose, Bld: 83 mg/dL (ref 70–99)
Potassium: 4.2 mmol/L (ref 3.5–5.1)
Sodium: 139 mmol/L (ref 135–145)

## 2020-12-04 LAB — MAGNESIUM: Magnesium: 1.7 mg/dL (ref 1.7–2.4)

## 2020-12-04 MED ORDER — NICOTINE 14 MG/24HR TD PT24
14.0000 mg | MEDICATED_PATCH | Freq: Every day | TRANSDERMAL | Status: DC
  Administered 2020-12-04: 14 mg via TRANSDERMAL
  Filled 2020-12-04: qty 1

## 2020-12-04 NOTE — Care Management Important Message (Signed)
Important Message  Patient Details  Name: Rebecca Parker MRN: [id number] Date of Birth: 1950-12-14   Medicare Important Message Given:  N/A - LOS <3 / Initial given by admissions     [name] 12/04/2020, 3:43 PM

## 2020-12-04 NOTE — Progress Notes (Signed)
Subjective: Patient reports mild right lower / central abd pain  Po intake adequate . Several BM today .  Pain ok , only required roxicodone today    Hospitalist has signed off Objective: I have reviewed patient's vital signs, intake and output, medications, and labs.  Lungs CTA   CV RRR   Abd , non distended  mild TTP , no rebound   Assessment/Plan: Pelvic pain , r/o uterine cancer. Spoke with pathologists , awaiting results  If results are not back in am tomorrow I will d/c  home . Daugther in room tonight and she understands   LOS: 3 days    Rebecca Parker 12/04/2020, 6:12 PM

## 2020-12-04 NOTE — Progress Notes (Signed)
   12/04/20 0900  Clinical Encounter Type  Visited With Patient and family together  Visit Type Follow-up;Spiritual support  Referral From Chaplain  Consult/Referral To Chaplain;Nurse  [place] did a follow up visit in room 341 pt, Ms. Rebecca Parker. Chaplain Lenetta Piche engaged with Rebecca Parker on Saturday morning who gave Chaplain a little back story of why [name] was hospitalized. Chaplain advised the Parker that I would visit her on Monday when I come back to work. When I entered the room of the Rebecca [name] was at her bedside. Rebecca stated, "I don't know what's going on with me. I am waiting for the doctors to tell me what's wrong." I used reflective listening, provided words of encouragement and prayer was given and received at the end of the visit. I advised the Rebecca and [name] I will follow up again and I will continue to keep her in my prayers.

## 2020-12-05 ENCOUNTER — Telehealth: Payer: Self-pay

## 2020-12-05 ENCOUNTER — Other Ambulatory Visit: Payer: Self-pay | Admitting: Obstetrics and Gynecology

## 2020-12-05 LAB — BASIC METABOLIC PANEL
Anion gap: 5 (ref 5–15)
BUN: 9 mg/dL (ref 8–23)
CO2: 27 mmol/L (ref 22–32)
Calcium: 7.8 mg/dL — ABNORMAL LOW (ref 8.9–10.3)
Chloride: 106 mmol/L (ref 98–111)
Creatinine, Ser: 1 mg/dL (ref 0.44–1.00)
GFR, Estimated: 60 mL/min — ABNORMAL LOW (ref >60)
Glucose, Bld: 86 mg/dL (ref 70–99)
Potassium: 4 mmol/L (ref 3.5–5.1)
Sodium: 138 mmol/L (ref 135–145)

## 2020-12-05 LAB — CBC WITH DIFFERENTIAL/PLATELET
Abs Immature Granulocytes: 0.11 10*3/uL — ABNORMAL HIGH (ref 0.00–0.07)
Basophils Absolute: 0 10*3/uL (ref 0.0–0.1)
Basophils Relative: 0 %
Eosinophils Absolute: 0.2 10*3/uL (ref 0.0–0.5)
Eosinophils Relative: 1 %
HCT: 24.3 % — ABNORMAL LOW (ref 36.0–46.0)
Hemoglobin: 8.5 g/dL — ABNORMAL LOW (ref 12.0–15.0)
Immature Granulocytes: 1 %
Lymphocytes Relative: 20 %
Lymphs Abs: 2.5 10*3/uL (ref 0.7–4.0)
MCH: 33.5 pg (ref 26.0–34.0)
MCHC: 35 g/dL (ref 30.0–36.0)
MCV: 95.7 fL (ref 80.0–100.0)
Monocytes Absolute: 1 10*3/uL (ref 0.1–1.0)
Monocytes Relative: 8 %
Neutro Abs: 8.7 10*3/uL — ABNORMAL HIGH (ref 1.7–7.7)
Neutrophils Relative %: 70 %
Platelets: 401 10*3/uL — ABNORMAL HIGH (ref 150–400)
RBC: 2.54 MIL/uL — ABNORMAL LOW (ref 3.87–5.11)
RDW: 20.1 % — ABNORMAL HIGH (ref 11.5–15.5)
WBC: 12.5 10*3/uL — ABNORMAL HIGH (ref 4.0–10.5)
nRBC: 0 % (ref 0.0–0.2)

## 2020-12-05 LAB — MAGNESIUM: Magnesium: 1.9 mg/dL (ref 1.7–2.4)

## 2020-12-05 LAB — SURGICAL PATHOLOGY

## 2020-12-05 MED ORDER — OXYCODONE HCL 5 MG PO TABS: 5.0000 mg | ORAL_TABLET | Freq: Three times a day (TID) | ORAL | 0 refills | Status: DC | PRN

## 2020-12-05 MED ORDER — AMOXICILLIN-POT CLAVULANATE 875-125 MG PO TABS: 1.0000 | ORAL_TABLET | Freq: Two times a day (BID) | ORAL | 0 refills | Status: AC

## 2020-12-05 MED ORDER — ONDANSETRON HCL 4 MG PO TABS: 4.0000 mg | ORAL_TABLET | Freq: Four times a day (QID) | ORAL | 0 refills | Status: DC | PRN

## 2020-12-05 NOTE — Telephone Encounter (Signed)
Received call from inpatient that Ms. Rebecca Parker needs a 10 day hospital follow up at the request of Dr. 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 Appointment arranged and will be provided to Rebecca Parker by inpatient nurse prior to discharge.

## 2020-12-05 NOTE — Discharge Summary (Signed)
Physician Discharge Summary  Patient ID: Rebecca Parker MRN: [id number] DOB/AGE: August 22, 1950 70 y.o.  Admit date: 12/01/2020 Discharge date: 12/05/2020  Admission Diagnoses:pelvic pain , pyometrium  Discharge Diagnoses: pelvic pain , cervical squamous cell carcinoma Principal Problem:   Sepsis ([place]) Active Problems:   Benign essential hypertension   Chronic diastolic CHF (congestive heart failure), NYHA class 2 (HCC)   Type 2 diabetes mellitus without complications (HCC)   Ovarian mass, right   Acute lower UTI   Pelvic pain   Discharged Condition: good  Hospital Course: pt was admitted from the ED for 1 day h/o right lower abdominal / pelvic pain . CTscan revealed a right ovarian cyst 5 cm and air in endometrial cavity .Bedside exam revealed a firm cervical rim and purulent d/c . Fx D+C  and cervical bx performed the day of admission . ( Results returned 12/05/20 - squamous cell CA of the cervix with cancer in ECC and endometrial curettings ). TVUS POD#1 showed a complex right ovarian cyst with normal doppler flow .inital thought of urosepsis resulted with neg urine culture. (Probable purulent d/c contamination of the UA) Clinically patient's pain improved with IV/ PO abx . Hospitalist helped managed DM and CHF ( no significant tx) While in hospital .  IV access difficult and PICC line was requested if additional IV needed .  Anemia noted and she is taking po iron . Not symptomatic    Of note + UDS cocaine on admission  Consults:  hospitalist  Significant Diagnostic Studies Recent Results (from the past 2160 hour(s))  Basic metabolic panel     Status: Abnormal   Collection Time: 11/18/20  1:11 AM  Result Value Ref Range   Sodium 138 135 - 145 mmol/L   Potassium 4.7 3.5 - 5.1 mmol/L   Chloride 104 98 - 111 mmol/L   CO2 29 22 - 32 mmol/L   Glucose, Bld 115 (H) 70 - 99 mg/dL    Comment: Glucose reference range applies only to samples taken after fasting for at least 8 hours.   BUN 12  8 - 23 mg/dL   Creatinine, Ser 1.05 (H) 0.44 - 1.00 mg/dL   Calcium 8.6 (L) 8.9 - 10.3 mg/dL   GFR, Estimated 56 (L) >60 mL/min    Comment: (NOTE) Calculated using the CKD-EPI Creatinine Equation (2021)    Anion gap 5 5 - 15    Comment: Performed at [facility], [address] [place], [place] [address]  CBC     Status: Abnormal   Collection Time: 11/18/20  1:11 AM  Result Value Ref Range   WBC 13.8 (H) 4.0 - 10.5 K/uL   RBC 3.52 (L) 3.87 - 5.11 MIL/uL   Hemoglobin 11.4 (L) 12.0 - 15.0 g/dL   HCT 33.9 (L) 36.0 - 46.0 %   MCV 96.3 80.0 - 100.0 fL   MCH 32.4 26.0 - 34.0 pg   MCHC 33.6 30.0 - 36.0 g/dL   RDW 25.2 (H) 11.5 - 15.5 %   Platelets 498 (H) 150 - 400 K/uL   nRBC 0.0 0.0 - 0.2 %    Comment: Performed at [facility], [address], [place], [place] [address]  Surgical pathology     Status: None   Collection Time: 12/01/20 12:34 PM  Result Value Ref Range   SURGICAL PATHOLOGY      SURGICAL PATHOLOGY CASE: [phone] PATIENT: Rebecca Parker Surgical Pathology Report     Specimen Submitted: A. Endocervical curettings B. Endometrial curettings  Clinical History: Abdominal pain      DIAGNOSIS: A. ENDOCERVIX; CURETTAGE: - DETACHED FRAGMENTS OF SQUAMOUS CELL CARCINOMA AND BLOOD CLOT.  B. ENDOMETRIUM; CURETTAGE: - DETACHED FRAGMENTS OF SQUAMOUS CELL CARCINOMA AND BLOOD CLOT.   GROSS DESCRIPTION: A. Labeled: Endocervical curettings Received: In formalin Collection time: 6:36 PM on 12/01/2020 Placed into formalin time: 6:37 PM on 12/01/2020 Tissue fragment(s): Multiple Size: Aggregate, 2.5 x 2.5 x 1.3 cm Description: Received is an aggregate of red-tan soft hemorrhagic material and red and tan tissue fragments. Entirely submitted in cassettes 1-5.  B. Labeled: Endometrial curettings Received: In formalin Collection time: 6:34 PM on 12/01/2020 Placed into formalin time: 6:37 PM on 12/01/2020 Tissue fragment(s): Multiple Size: Aggregate,   1.8 x 0.9 x 0.1 cm Description: Received is an aggregate of red-tan soft hemorrhagic material. Entirely submitted in cassette 1.  BAS 12/04/2020   Final Diagnosis performed by Betsy Pries, MD.   Electronically signed 12/05/2020 9:40:35AM The electronic signature indicates that the named Attending Pathologist has evaluated the specimen Technical component performed at [facility], [address] [place], [place] [address] Lab: [phone] Dir: Rush Farmer, MD, MMM  Professional component performed at [facility], [facility], [address], [place], [place] [address] Lab: [phone] Dir: Dellia Nims. Reuel Derby, MD   Basic metabolic panel     Status: Abnormal   Collection Time: 12/01/20  1:11 PM  Result Value Ref Range   Sodium 136 135 - 145 mmol/L   Potassium 5.1 3.5 - 5.1 mmol/L    Comment: HEMOLYSIS AT THIS LEVEL MAY AFFECT RESULT   Chloride 103 98 - 111 mmol/L   CO2 24 22 - 32 mmol/L   Glucose, Bld 73 70 - 99 mg/dL    Comment: Glucose reference range applies only to samples taken after fasting for at least 8 hours.   BUN 22 8 - 23 mg/dL   Creatinine, Ser 1.24 (H) 0.44 - 1.00 mg/dL   Calcium 8.3 (L) 8.9 - 10.3 mg/dL   GFR, Estimated 46 (L) >60 mL/min    Comment: (NOTE) Calculated using the CKD-EPI Creatinine Equation (2021)    Anion gap 9 5 - 15    Comment: Performed at [facility], [address] [place], [place] [address]  CBC     Status: Abnormal   Collection Time: 12/01/20  1:11 PM  Result Value Ref Range   WBC 23.3 (H) 4.0 - 10.5 K/uL   RBC 3.17 (L) 3.87 - 5.11 MIL/uL   Hemoglobin 11.0 (L) 12.0 - 15.0 g/dL   HCT 31.3 (L) 36.0 - 46.0 %   MCV 98.7 80.0 - 100.0 fL   MCH 34.7 (H) 26.0 - 34.0 pg   MCHC 35.1 30.0 - 36.0 g/dL   RDW 21.7 (H) 11.5 - 15.5 %   Platelets 426 (H) 150 - 400 K/uL   nRBC 0.0 0.0 - 0.2 %    Comment: Performed at [facility], [address] [place], [place] [address]  Urinalysis, Complete w Microscopic      Status: Abnormal   Collection Time: 12/01/20  1:11 PM  Result Value Ref Range   Color, Urine YELLOW (A) YELLOW   APPearance CLOUDY (A) CLEAR   Specific Gravity, Urine 1.012 1.005 - 1.030   pH 5.0 5.0 - 8.0   Glucose, UA NEGATIVE NEGATIVE mg/dL   Hgb urine dipstick SMALL (A) NEGATIVE   Bilirubin Urine NEGATIVE NEGATIVE   Ketones, ur NEGATIVE NEGATIVE mg/dL   Protein, ur NEGATIVE NEGATIVE mg/dL   Nitrite  NEGATIVE NEGATIVE   Leukocytes,Ua LARGE (A) NEGATIVE   RBC / HPF 6-10 0 - 5 RBC/hpf   WBC, UA >50 (H) 0 - 5 WBC/hpf   Bacteria, UA RARE (A) NONE SEEN   Squamous Epithelial / LPF 0-5 0 - 5   WBC Clumps PRESENT    Mucus PRESENT    Hyaline Casts, UA PRESENT     Comment: Performed at [facility], [address] [place], [place] [address]  Lactic acid, plasma     Status: None   Collection Time: 12/01/20  1:11 PM  Result Value Ref Range   Lactic Acid, Venous 1.6 0.5 - 1.9 mmol/L    Comment: Performed at [facility], [address] [place], [place] [address]  Protime-INR     Status: None   Collection Time: 12/01/20  1:11 PM  Result Value Ref Range   Prothrombin Time 14.6 11.4 - 15.2 seconds   INR 1.1 0.8 - 1.2    Comment: (NOTE) INR goal varies based on device and disease states. Performed at [facility], [address] [place], [place] [address]   APTT     Status: Abnormal   Collection Time: 12/01/20  1:11 PM  Result Value Ref Range   aPTT 39 (H) 24 - 36 seconds    Comment:        IF BASELINE aPTT IS ELEVATED, SUGGEST PATIENT RISK ASSESSMENT BE USED TO DETERMINE APPROPRIATE ANTICOAGULANT THERAPY. Performed at [facility], [address], [place], [place] [address]   Urine Culture     Status: Abnormal   Collection Time: 12/01/20  1:11 PM   Specimen: Urine, Random  Result Value Ref Range   Specimen Description      URINE, RANDOM Performed at [facility], [address] [place], [place] [address]    Special  Requests      NONE Performed at [facility], [address], [place], [place] [address]    Culture (A)     <10,000 COLONIES/mL INSIGNIFICANT GROWTH Performed at [facility], [place] [address], [place], [place] [address]    Report Status 12/02/2020 FINAL   Lipase, blood     Status: None   Collection Time: 12/01/20  1:11 PM  Result Value Ref Range   Lipase 37 11 - 51 U/L    Comment: Performed at [facility], [place], [place] [address]  Troponin I (High Sensitivity)     Status: None   Collection Time: 12/01/20  1:11 PM  Result Value Ref Range   Troponin I (High Sensitivity) 17 <18 ng/L    Comment: (NOTE) Elevated high sensitivity troponin I (hsTnI) values and significant  changes across serial measurements may suggest ACS but many other  chronic and acute conditions are known to elevate hsTnI results.  Refer to the "Links" section for chest pain algorithms and additional  guidance. Performed at [facility], [place], [place] [address]   Procalcitonin - Baseline     Status: None   Collection Time: 12/01/20  1:11 PM  Result Value Ref Range   Procalcitonin 1.58 ng/mL    Comment:        Interpretation: PCT > 0.5 ng/mL and <= 2 ng/mL: Systemic infection (sepsis) is possible, but other conditions are known to elevate PCT as well. (NOTE)       Sepsis PCT Algorithm           Lower Respiratory Tract  Infection PCT Algorithm    ----------------------------     ----------------------------         PCT < 0.25 ng/mL                PCT < 0.10 ng/mL          Strongly encourage             Strongly discourage   discontinuation of antibiotics    initiation of antibiotics    ----------------------------     -----------------------------       PCT 0.25 - 0.50 ng/mL            PCT 0.10 - 0.25 ng/mL               OR       >80% decrease in PCT            Discourage initiation of                                             antibiotics      Encourage discontinuation           of antibiotics    ----------------------------     -----------------------------         PCT >= 0.50 ng/mL              PCT 0.26 - 0.50 ng/mL                AND       <80% decrease in PCT             Encourage initiation of                                             antibiotics       Encourage continuation           of antibiotics    ----------------------------     -----------------------------        PCT >= 0.50 ng/mL                  PCT > 0.50 ng/mL               AND         increase in PCT                  Strongly encourage                                      initiation of antibiotics    Strongly encourage escalation           of antibiotics                                     -----------------------------                                           PCT <= 0.25 ng/mL  OR                                        > 80% decrease in PCT                                      Discontinue / Do not initiate                                             antibiotics  Performed at [facility], [address] [place], [place] [address]   Blood culture (routine single)     Status: None (Preliminary result)   Collection Time: 12/01/20  1:12 PM   Specimen: BLOOD  Result Value Ref Range   Specimen Description BLOOD BLOOD LEFT HAND    Special Requests      BOTTLES DRAWN AEROBIC AND ANAEROBIC Blood Culture results may not be optimal due to an inadequate volume of blood received in culture bottles   Culture      NO GROWTH 4 DAYS Performed at [facility], [address] [place], [place] [address]    Report Status PENDING   Resp Panel by RT-PCR (Flu A&B, Covid) Nasopharyngeal Swab     Status: None   Collection Time: 12/01/20  2:04 PM   Specimen: Nasopharyngeal Swab; Nasopharyngeal(NP) swabs in vial transport medium  Result Value Ref Range    SARS Coronavirus 2 by RT PCR NEGATIVE NEGATIVE    Comment: (NOTE) SARS-CoV-2 target nucleic acids are NOT DETECTED.  The SARS-CoV-2 RNA is generally detectable in upper respiratory specimens during the acute phase of infection. The lowest concentration of SARS-CoV-2 viral copies this assay can detect is 138 copies/mL. A negative result does not preclude SARS-Cov-2 infection and should not be used as the sole basis for treatment or other patient management decisions. A negative result may occur with  improper specimen collection/handling, submission of specimen other than nasopharyngeal swab, presence of viral mutation(s) within the areas targeted by this assay, and inadequate number of viral copies(<138 copies/mL). A negative result must be combined with clinical observations, patient history, and epidemiological information. The expected result is Negative.  Fact Sheet for Patients:  EntrepreneurPulse.com.au  Fact Sheet for Healthcare Providers:  IncredibleEmployment.be  This test is no t yet approved or cleared by the [place] FDA and  has been authorized for detection and/or diagnosis of SARS-CoV-2 by FDA under an Emergency Use Authorization (EUA). This EUA will remain  in effect (meaning this test can be used) for the duration of the COVID-19 declaration under Section 564(b)(1) of the Act, 21 U.S.C.section 360bbb-3(b)(1), unless the authorization is terminated  or revoked sooner.       Influenza A by PCR NEGATIVE NEGATIVE   Influenza B by PCR NEGATIVE NEGATIVE    Comment: (NOTE) The Xpert Xpress SARS-CoV-2/FLU/RSV plus assay is intended as an aid in the diagnosis of influenza from Nasopharyngeal swab specimens and should not be used as a sole basis for treatment. Nasal washings and aspirates are unacceptable for Xpert Xpress SARS-CoV-2/FLU/RSV testing.  Fact Sheet for Patients: EntrepreneurPulse.com.au  Fact  Sheet for Healthcare Providers: IncredibleEmployment.be  This test is not yet approved or cleared by the [place]  States FDA and has been authorized for detection and/or diagnosis of SARS-CoV-2 by FDA under an Emergency Use Authorization (EUA). This EUA will remain in effect (meaning this test can be used) for the duration of the COVID-19 declaration under Section 564(b)(1) of the Act, 21 U.S.C. section 360bbb-3(b)(1), unless the authorization is terminated or revoked.  Performed at [facility], [address] [place], [place] [address]   CBG monitoring, ED     Status: None   Collection Time: 12/01/20  5:30 PM  Result Value Ref Range   Glucose-Capillary 80 70 - 99 mg/dL    Comment: Glucose reference range applies only to samples taken after fasting for at least 8 hours.  POC CBG, ED     Status: None   Collection Time: 12/01/20  5:53 PM  Result Value Ref Range   Glucose-Capillary 76 70 - 99 mg/dL    Comment: Glucose reference range applies only to samples taken after fasting for at least 8 hours.  ABO/Rh     Status: None   Collection Time: 12/01/20  5:53 PM  Result Value Ref Range   ABO/RH(D)      A POS Performed at [facility], [address] [place], [place] [address]   Surgical pathology     Status: None   Collection Time: 12/01/20  6:29 PM  Result Value Ref Range   SURGICAL PATHOLOGY      SURGICAL PATHOLOGY CASE: [id number] PATIENT: Wetzel Bjornstad Surgical Pathology Report     Specimen Submitted: A. Cervix, 12, 3, 6, 9; bx  Clinical History: Abdominal pain      DIAGNOSIS: A. CERVIX, 12, 3, 6, 9; BIOPSY: - INVASIVE SQUAMOUS CELL CARCINOMA.   GROSS DESCRIPTION: A. Labeled: Cervical biopsies 12, 3, 6, 9 Received: In formalin Collection time: 6:29 PM on 12/01/2020 Placed into formalin time: 6:29 PM on 12/01/2020 Tissue fragment(s): 4 Size: Aggregate, 1.5 x 1.2 x 0.3 cm Description: Received are 4 fragments of tan  tissue mixed with scant friable material. Entirely submitted in cassettes 1-2.  BAS 12/04/2020   Final Diagnosis performed by Betsy Pries, MD.   Electronically signed 12/05/2020 9:41:41AM The electronic signature indicates that the named Attending Pathologist has evaluated the specimen Technical component performed at [facility], [address], [place], [place] [address] Lab: [phone] Dir: Rush Farmer, MD, MMM  Professional co mponent performed at [facility], [facility], [address], [place], [place] [address] Lab: [phone] Dir: Dellia Nims. Rubinas, MD   CBG monitoring, ED     Status: None   Collection Time: 12/01/20  7:04 PM  Result Value Ref Range   Glucose-Capillary 79 70 - 99 mg/dL    Comment: Glucose reference range applies only to samples taken after fasting for at least 8 hours.  Lactic acid, plasma     Status: None   Collection Time: 12/01/20  8:31 PM  Result Value Ref Range   Lactic Acid, Venous 1.4 0.5 - 1.9 mmol/L    Comment: Performed at [facility], [address] [place], [place] [address]  Culture, blood (single)     Status: None (Preliminary result)   Collection Time: 12/01/20  8:31 PM   Specimen: BLOOD  Result Value Ref Range   Specimen Description BLOOD BLOOD RIGHT HAND    Special Requests      BOTTLES DRAWN AEROBIC AND ANAEROBIC Blood Culture adequate volume   Culture      NO GROWTH 4 DAYS Performed at [facility], [address] [place], [place] [address]    Report Status PENDING   Type and screen Ordered by ORDER LAB     Status: None   Collection Time: 12/01/20  8:31 PM  Result Value Ref Range   ABO/RH(D) A POS    Antibody Screen NEG    Sample Expiration      12/04/2020,2359 Performed at [facility], [address] [place], [place] [address]   Hemoglobin A1c     Status: None   Collection Time: 12/01/20  8:31 PM  Result Value Ref Range   Hgb A1c MFr Bld 5.4 4.8 - 5.6 %    Comment:  (NOTE) Pre diabetes:          5.7%-6.4%  Diabetes:              >6.4%  Glycemic control for   <7.0% adults with diabetes    Mean Plasma Glucose 108.28 mg/dL    Comment: Performed at [facility] [address] [place], [place] [address]  Brain natriuretic peptide     Status: None   Collection Time: 12/01/20  8:31 PM  Result Value Ref Range   B Natriuretic Peptide 64.8 0.0 - 100.0 pg/mL    Comment: Performed at [facility], [address] [place], [place] [address]  Glucose, capillary     Status: Abnormal   Collection Time: 12/01/20 11:13 PM  Result Value Ref Range   Glucose-Capillary 167 (H) 70 - 99 mg/dL    Comment: Glucose reference range applies only to samples taken after fasting for at least 8 hours.  Glucose, capillary     Status: None   Collection Time: 12/02/20  3:45 AM  Result Value Ref Range   Glucose-Capillary 81 70 - 99 mg/dL    Comment: Glucose reference range applies only to samples taken after fasting for at least 8 hours.  Procalcitonin     Status: None   Collection Time: 12/02/20  7:46 AM  Result Value Ref Range   Procalcitonin 1.03 ng/mL    Comment:        Interpretation: PCT > 0.5 ng/mL and <= 2 ng/mL: Systemic infection (sepsis) is possible, but other conditions are known to elevate PCT as well. (NOTE)       Sepsis PCT Algorithm           Lower Respiratory Tract                                      Infection PCT Algorithm    ----------------------------     ----------------------------         PCT < 0.25 ng/mL                PCT < 0.10 ng/mL          Strongly encourage             Strongly discourage   discontinuation of antibiotics    initiation of antibiotics    ----------------------------     -----------------------------       PCT 0.25 - 0.50 ng/mL            PCT 0.10 - 0.25 ng/mL               OR       >80% decrease in PCT            Discourage initiation of  antibiotics       Encourage discontinuation           of antibiotics    ----------------------------     -----------------------------         PCT >= 0.50 ng/mL              PCT 0.26 - 0.50 ng/mL                AND       <80% decrease in PCT             Encourage initiation of                                             antibiotics       Encourage continuation           of antibiotics    ----------------------------     -----------------------------        PCT >= 0.50 ng/mL                  PCT > 0.50 ng/mL               AND         increase in PCT                  Strongly encourage                                      initiation of antibiotics    Strongly encourage escalation           of antibiotics                                     -----------------------------                                           PCT <= 0.25 ng/mL                                                 OR                                        > 80% decrease in PCT                                      Discontinue / Do not initiate                                             antibiotics  Performed at [facility], [address] [place], [place] [address]   Comprehensive metabolic panel     Status: Abnormal   Collection  Time: 12/02/20  7:46 AM  Result Value Ref Range   Sodium 138 135 - 145 mmol/L   Potassium 4.7 3.5 - 5.1 mmol/L   Chloride 106 98 - 111 mmol/L   CO2 24 22 - 32 mmol/L   Glucose, Bld 80 70 - 99 mg/dL    Comment: Glucose reference range applies only to samples taken after fasting for at least 8 hours.   BUN 18 8 - 23 mg/dL   Creatinine, Ser 1.07 (H) 0.44 - 1.00 mg/dL   Calcium 7.9 (L) 8.9 - 10.3 mg/dL   Total Protein 6.5 6.5 - 8.1 g/dL   Albumin 1.8 (L) 3.5 - 5.0 g/dL   AST 17 15 - 41 U/L   ALT 10 0 - 44 U/L   Alkaline Phosphatase 74 38 - 126 U/L   Total Bilirubin 0.5 0.3 - 1.2 mg/dL   GFR, Estimated 55 (L) >60 mL/min    Comment: (NOTE) Calculated using the CKD-EPI Creatinine Equation (2021)     Anion gap 8 5 - 15    Comment: Performed at [facility], [address] [place], [place] [address]  CBC     Status: Abnormal   Collection Time: 12/02/20  7:46 AM  Result Value Ref Range   WBC 21.7 (H) 4.0 - 10.5 K/uL   RBC 2.83 (L) 3.87 - 5.11 MIL/uL   Hemoglobin 9.6 (L) 12.0 - 15.0 g/dL   HCT 28.0 (L) 36.0 - 46.0 %   MCV 98.9 80.0 - 100.0 fL   MCH 33.9 26.0 - 34.0 pg   MCHC 34.3 30.0 - 36.0 g/dL   RDW 21.0 (H) 11.5 - 15.5 %   Platelets 413 (H) 150 - 400 K/uL   nRBC 0.0 0.0 - 0.2 %    Comment: Performed at [facility], [address] [place], [place] [address]  Glucose, capillary     Status: Abnormal   Collection Time: 12/02/20  7:56 AM  Result Value Ref Range   Glucose-Capillary 69 (L) 70 - 99 mg/dL    Comment: Glucose reference range applies only to samples taken after fasting for at least 8 hours.  Glucose, capillary     Status: None   Collection Time: 12/02/20  8:27 AM  Result Value Ref Range   Glucose-Capillary 96 70 - 99 mg/dL    Comment: Glucose reference range applies only to samples taken after fasting for at least 8 hours.  Gentamicin level, random     Status: None   Collection Time: 12/02/20 10:10 AM  Result Value Ref Range   Gentamicin Rm 7.1 ug/mL    Comment:        Random Gentamicin therapeutic range is dependent on dosage and time of specimen collection. A peak range is 5.0-10.0 ug/mL A trough range is 0.5-2.0 ug/mL        Performed at [facility], [place], [place] [address]   CA 125     Status: None   Collection Time: 12/02/20 12:52 PM  Result Value Ref Range   Cancer Antigen (CA) 125 27.4 0.0 - 38.1 U/mL    Comment: (NOTE) Roche Diagnostics Electrochemiluminescence Immunoassay (ECLIA) Values obtained with different assay methods or kits cannot be used interchangeably.  Results cannot be interpreted as absolute evidence of the presence or absence of malignant disease. Performed At: [facility], [facility] [place], [place] [id number] Rush Farmer MD [id number]   Urine Drug Screen, Qualitative [facility] only)     Status:  Abnormal   Collection Time: 12/02/20 10:51 PM  Result Value Ref Range   Tricyclic, Ur Screen NONE DETECTED NONE DETECTED   Amphetamines, Ur Screen NONE DETECTED NONE DETECTED   MDMA (Ecstasy)Ur Screen NONE DETECTED NONE DETECTED   Cocaine Metabolite,Ur [place] POSITIVE (A) NONE DETECTED   Opiate, Ur Screen NONE DETECTED NONE DETECTED   Phencyclidine (PCP) Ur S NONE DETECTED NONE DETECTED   Cannabinoid 50 Ng, Ur [place] NONE DETECTED NONE DETECTED   Barbiturates, Ur Screen NONE DETECTED NONE DETECTED   Benzodiazepine, Ur Scrn NONE DETECTED NONE DETECTED   Methadone Scn, Ur NONE DETECTED NONE DETECTED    Comment: (NOTE) Tricyclics + metabolites, urine    Cutoff 1000 ng/mL Amphetamines + metabolites, urine  Cutoff 1000 ng/mL MDMA (Ecstasy), urine              Cutoff 500 ng/mL Cocaine Metabolite, urine          Cutoff 300 ng/mL Opiate + metabolites, urine        Cutoff 300 ng/mL Phencyclidine (PCP), urine         Cutoff 25 ng/mL Cannabinoid, urine                 Cutoff 50 ng/mL Barbiturates + metabolites, urine  Cutoff 200 ng/mL Benzodiazepine, urine              Cutoff 200 ng/mL Methadone, urine                   Cutoff 300 ng/mL  The urine drug screen provides only a preliminary, unconfirmed analytical test result and should not be used for non-medical purposes. Clinical consideration and professional judgment should be applied to any positive drug screen result due to possible interfering substances. A more specific alternate chemical method must be used in order to obtain a confirmed analytical result. Gas chromatography / mass spectrometry (GC/MS) is the preferred confirm atory method. Performed at [facility], [address] [place], [place] [address]   HIV Antibody (routine testing w rflx)     Status: None   Collection Time:  12/03/20  6:36 AM  Result Value Ref Range   HIV Screen 4th Generation wRfx Non Reactive Non Reactive    Comment: Performed at [facility], [place] [address] [place], [place] [address]  Procalcitonin     Status: None   Collection Time: 12/03/20  6:36 AM  Result Value Ref Range   Procalcitonin 0.56 ng/mL    Comment:        Interpretation: PCT > 0.5 ng/mL and <= 2 ng/mL: Systemic infection (sepsis) is possible, but other conditions are known to elevate PCT as well. (NOTE)       Sepsis PCT Algorithm           Lower Respiratory Tract                                      Infection PCT Algorithm    ----------------------------     ----------------------------         PCT < 0.25 ng/mL                PCT < 0.10 ng/mL          Strongly encourage             Strongly discourage   discontinuation of antibiotics    initiation of antibiotics    ----------------------------     -----------------------------  PCT 0.25 - 0.50 ng/mL            PCT 0.10 - 0.25 ng/mL               OR       >80% decrease in PCT            Discourage initiation of                                            antibiotics      Encourage discontinuation           of antibiotics    ----------------------------     -----------------------------         PCT >= 0.50 ng/mL              PCT 0.26 - 0.50 ng/mL                AND       <80% decrease in PCT             Encourage initiation of                                             antibiotics       Encourage continuation           of antibiotics    ----------------------------     -----------------------------        PCT >= 0.50 ng/mL                  PCT > 0.50 ng/mL               AND         increase in PCT                  Strongly encourage                                      initiation of antibiotics    Strongly encourage escalation           of antibiotics                                     -----------------------------                                            PCT <= 0.25 ng/mL                                                 OR                                        > 80% decrease in PCT  Discontinue / Do not initiate                                             antibiotics  Performed at [facility], [address] [place], [place] [address]   CBC with Differential/Platelet     Status: Abnormal   Collection Time: 12/03/20  6:36 AM  Result Value Ref Range   WBC 14.5 (H) 4.0 - 10.5 K/uL   RBC 2.65 (L) 3.87 - 5.11 MIL/uL   Hemoglobin 8.9 (L) 12.0 - 15.0 g/dL   HCT 25.8 (L) 36.0 - 46.0 %   MCV 97.4 80.0 - 100.0 fL   MCH 33.6 26.0 - 34.0 pg   MCHC 34.5 30.0 - 36.0 g/dL   RDW 20.5 (H) 11.5 - 15.5 %   Platelets 391 150 - 400 K/uL   nRBC 0.0 0.0 - 0.2 %   Neutrophils Relative % 71 %   Neutro Abs 10.3 (H) 1.7 - 7.7 K/uL   Lymphocytes Relative 18 %   Lymphs Abs 2.6 0.7 - 4.0 K/uL   Monocytes Relative 8 %   Monocytes Absolute 1.1 (H) 0.1 - 1.0 K/uL   Eosinophils Relative 2 %   Eosinophils Absolute 0.3 0.0 - 0.5 K/uL   Basophils Relative 0 %   Basophils Absolute 0.0 0.0 - 0.1 K/uL   Immature Granulocytes 1 %   Abs Immature Granulocytes 0.12 (H) 0.00 - 0.07 K/uL    Comment: Performed at [facility], [address], [place], [place] [id number]  Basic metabolic panel     Status: Abnormal   Collection Time: 12/03/20  6:36 AM  Result Value Ref Range   Sodium 138 135 - 145 mmol/L   Potassium 4.2 3.5 - 5.1 mmol/L   Chloride 107 98 - 111 mmol/L   CO2 27 22 - 32 mmol/L   Glucose, Bld 89 70 - 99 mg/dL    Comment: Glucose reference range applies only to samples taken after fasting for at least 8 hours.   BUN 17 8 - 23 mg/dL   Creatinine, Ser 1.28 (H) 0.44 - 1.00 mg/dL   Calcium 7.9 (L) 8.9 - 10.3 mg/dL   GFR, Estimated 45 (L) >60 mL/min    Comment: (NOTE) Calculated using the CKD-EPI Creatinine Equation (2021)    Anion gap 4 (L) 5 - 15    Comment: Performed at [facility], [address] [place], [place] [address]  Magnesium     Status: None   Collection Time: 12/03/20  6:36 AM  Result Value Ref Range   Magnesium 1.9 1.7 - 2.4 mg/dL    Comment: Performed at [facility], [address] [place], [place] [address]  Magnesium     Status: None   Collection Time: 12/04/20  5:17 AM  Result Value Ref Range   Magnesium 1.7 1.7 - 2.4 mg/dL    Comment: Performed at [facility], [address] [place], [place] [address]  CBC     Status: Abnormal   Collection Time: 12/04/20  5:17 AM  Result Value Ref Range   WBC 14.2 (H) 4.0 - 10.5 K/uL   RBC 2.54 (L) 3.87 - 5.11 MIL/uL   Hemoglobin 8.6 (L) 12.0 - 15.0 g/dL   HCT 24.6 (L) 36.0 - 46.0 %   MCV 96.9 80.0 - 100.0 fL   MCH 33.9 26.0 -  34.0 pg   MCHC 35.0 30.0 - 36.0 g/dL   RDW 20.7 (H) 11.5 - 15.5 %   Platelets 390 150 - 400 K/uL   nRBC 0.0 0.0 - 0.2 %    Comment: Performed at [facility], [address] [place], [place] [id number]  Basic metabolic panel     Status: Abnormal   Collection Time: 12/04/20  5:17 AM  Result Value Ref Range   Sodium 139 135 - 145 mmol/L   Potassium 4.2 3.5 - 5.1 mmol/L   Chloride 110 98 - 111 mmol/L   CO2 26 22 - 32 mmol/L   Glucose, Bld 83 70 - 99 mg/dL    Comment: Glucose reference range applies only to samples taken after fasting for at least 8 hours.   BUN 14 8 - 23 mg/dL   Creatinine, Ser 1.07 (H) 0.44 - 1.00 mg/dL   Calcium 7.8 (L) 8.9 - 10.3 mg/dL   GFR, Estimated 55 (L) >60 mL/min    Comment: (NOTE) Calculated using the CKD-EPI Creatinine Equation (2021)    Anion gap 3 (L) 5 - 15    Comment: Performed at [facility], [address] [place], [place] [address]  Magnesium     Status: None   Collection Time: 12/05/20  5:04 AM  Result Value Ref Range   Magnesium 1.9 1.7 - 2.4 mg/dL    Comment: Performed at [facility], [address] [place], [place] [address]  CBC with Differential/Platelet     Status:  Abnormal   Collection Time: 12/05/20  5:04 AM  Result Value Ref Range   WBC 12.5 (H) 4.0 - 10.5 K/uL   RBC 2.54 (L) 3.87 - 5.11 MIL/uL   Hemoglobin 8.5 (L) 12.0 - 15.0 g/dL   HCT 24.3 (L) 36.0 - 46.0 %   MCV 95.7 80.0 - 100.0 fL   MCH 33.5 26.0 - 34.0 pg   MCHC 35.0 30.0 - 36.0 g/dL   RDW 20.1 (H) 11.5 - 15.5 %   Platelets 401 (H) 150 - 400 K/uL   nRBC 0.0 0.0 - 0.2 %   Neutrophils Relative % 70 %   Neutro Abs 8.7 (H) 1.7 - 7.7 K/uL   Lymphocytes Relative 20 %   Lymphs Abs 2.5 0.7 - 4.0 K/uL   Monocytes Relative 8 %   Monocytes Absolute 1.0 0.1 - 1.0 K/uL   Eosinophils Relative 1 %   Eosinophils Absolute 0.2 0.0 - 0.5 K/uL   Basophils Relative 0 %   Basophils Absolute 0.0 0.0 - 0.1 K/uL   Immature Granulocytes 1 %   Abs Immature Granulocytes 0.11 (H) 0.00 - 0.07 K/uL    Comment: Performed at [facility], [address], [place], [place] [id number]  Basic metabolic panel     Status: Abnormal   Collection Time: 12/05/20  5:04 AM  Result Value Ref Range   Sodium 138 135 - 145 mmol/L   Potassium 4.0 3.5 - 5.1 mmol/L   Chloride 106 98 - 111 mmol/L   CO2 27 22 - 32 mmol/L   Glucose, Bld 86 70 - 99 mg/dL    Comment: Glucose reference range applies only to samples taken after fasting for at least 8 hours.   BUN 9 8 - 23 mg/dL   Creatinine, Ser 1.00 0.44 - 1.00 mg/dL   Calcium 7.8 (L) 8.9 - 10.3 mg/dL   GFR, Estimated 60 (L) >60 mL/min    Comment: (NOTE) Calculated using the CKD-EPI Creatinine Equation (  2021)    Anion gap 5 5 - 15    Comment: Performed at [facility], [address] [place], [place] [address]    Treatments: antibiotics: gentamycin and cleocin , switched to po flagyl  and surgery: as above  Discharge Exam: Blood pressure 136/85, pulse 75, temperature 98.5 F (36.9 C), temperature source Oral, resp. rate (!) 2, height 5' 1.5" (1.562 m), weight 67.6 kg, SpO2 100 %.  Lung CTA  CV RRR   Adb . Mild TTP , no rebounf Pelvic  deferred Disposition:  D/C HOME  Consult GYn / Onc for surgical intervention   Allergies as of 12/05/2020       Reactions   Ace Inhibitors Itching   Rash   Cyclobenzaprine Itching   Rash and 'made me nervous'        Medication List     STOP taking these medications    naproxen 500 MG tablet Commonly known as: NAPROSYN       TAKE these medications    amoxicillin-clavulanate 875-125 MG tablet Commonly known as: AUGMENTIN Take 1 tablet by mouth 2 (two) times daily for 10 days.   aspirin EC 81 MG tablet Take 81 mg by mouth daily.   Calcium Carbonate-Vitamin D 500-125 MG-UNIT Tabs Take by mouth.   docusate sodium 100 MG capsule Commonly known as: COLACE Take 100 mg by mouth daily.   furosemide 20 MG tablet Commonly known as: LASIX Take 20 mg by mouth 2 (two) times daily.   gabapentin 800 MG tablet Commonly known as: NEURONTIN Take 800 mg by mouth 2 (two) times daily.   hydrOXYzine 25 MG tablet Commonly known as: ATARAX/VISTARIL Take 25 mg by mouth daily. Take '25mg'$ . By mouth every morning   Iron 325 (65 Fe) MG Tabs Take 1 tablet by mouth 2 (two) times daily.   losartan 50 MG tablet Commonly known as: COZAAR Take 50 mg by mouth 2 (two) times daily.   metFORMIN 500 MG tablet Commonly known as: GLUCOPHAGE Take 500 mg by mouth 2 (two) times daily with a meal.   mirtazapine 15 MG tablet Commonly known as: REMERON Take 15 mg by mouth at bedtime.   ondansetron 4 MG tablet Commonly known as: ZOFRAN Take 1 tablet (4 mg total) by mouth every 6 (six) hours as needed for nausea.   oxyCODONE 5 MG immediate release tablet Commonly known as: Oxy IR/ROXICODONE Take 1 tablet (5 mg total) by mouth every 8 (eight) hours as needed for moderate pain.   simvastatin 20 MG tablet Commonly known as: ZOCOR Take 20 mg by mouth daily.       consult to GYn Nezperce ( [facility]  GYN / Onc)  Signed: Gwen Her Jadaya [place] 12/05/2020, 1:18 PM

## 2020-12-05 NOTE — Progress Notes (Signed)
Patient discharged home. Discharge instructions, prescriptions and follow up appointment given to and reviewed with patient. Patient verbalized understanding.

## 2020-12-05 NOTE — Progress Notes (Signed)
   12/05/20 1400  Clinical Encounter Type  Visited With Patient  Visit Type Follow-up;Spiritual support  Referral From Chaplain  Consult/Referral To Chaplain  Spiritual Encounters  Spiritual Needs Prayer;Emotional  Stress Factors  Patient Stress Factors Health changes  Chaplain Autumn Patty did a follow-up visited with pt in room 341A, Ms. Rebecca Parker. Ms. Geenen was preparing to discharge and she wanted a Chaplain to pray with her. Ms. [name] was anxious to go home, she stated she is ready to get out of here and see her son. I asked Ms. Ribar did they find out what was wrong and she stated, "I got cancer." I said I am so sorry to hear that. I asked what was the plan when she leaves the hospital, and she said, 'I got to do a lot of appointments and mess."  I said Ms. [name] you need to make sure you make your appointments because the Doctors are only trying to help you. I provided reflective listening, words of encouragement and prayer was requested and given.

## 2020-12-05 NOTE — Progress Notes (Signed)
Physical Therapy Treatment Patient Details Name: Rebecca Parker MRN: [id number] DOB: [birth year] Today's Date: 12/05/2020    History of Present Illness Pt is a 70 y/o F admitted with c/o lower abdominal pain x 24 hrs & presyncopal driving to her PCP [place].  Pt underwent dilation & curettage with cervical biopsies on 12/01/20. PMH: chest pain, CHF, DM, hip pain, HTN, multinodular goiter    PT Comments    Pt is making good progress with therapy and appears to be at baseline level. Pt enjoys ambulating with rollator in home environment. Noted abdominal discomfort during ambulation, however didn't appear to limit mobility. No further PT needs identified. Pt is up and ambulating in room without assist. Will dc in house. Please re-order if needs change.   Follow Up Recommendations  No PT follow up     Equipment Recommendations  None recommended by PT    Recommendations for Other Services       Precautions / Restrictions Precautions Precautions: Fall Restrictions Weight Bearing Restrictions: No    Mobility  Bed Mobility Overal bed mobility: Independent             General bed mobility comments: safe technique with ability to don socks independently    Transfers Overall transfer level: Modified independent Equipment used: Rolling walker (2 wheeled)             General transfer comment: safe technique with upright posture  Ambulation/Gait Ambulation/Gait assistance: Modified independent (Device/Increase time) Gait Distance (Feet): 200 Feet Assistive device: Rolling walker (2 wheeled) Gait Pattern/deviations: Step-through pattern     General Gait Details: able to carry conversation during ambulation with ease. Demonstrates B hip ER (L>R) during ambulation. No LOB noted   Stairs             Wheelchair Mobility    Modified Rankin (Stroke Patients Only)       Balance Overall balance assessment: Modified Independent                                           Cognition Arousal/Alertness: Awake/alert Behavior During Therapy: WFL for tasks assessed/performed Overall Cognitive Status: Within Functional Limits for tasks assessed                                 General Comments: pleasant and willing to participate      Exercises      General Comments        Pertinent Vitals/Pain Pain Assessment: Faces Faces Pain Scale: Hurts a little bit Pain Location: abdomen Pain Descriptors / Indicators: Discomfort Pain Intervention(s): Limited activity within patient's tolerance    Home Living                      Prior Function            PT Goals (current goals can now be found in the care plan section) Acute Rehab PT Goals Patient Stated Goal: to go home PT Goal Formulation: All assessment and education complete, DC therapy Time For Goal Achievement: 12/05/20 Potential to Achieve Goals: Good Progress towards PT goals: Progressing toward goals    Frequency           PT Plan Frequency needs to be updated    Co-evaluation  AM-PAC PT "6 Clicks" Mobility   Outcome Measure  Help needed turning from your back to your side while in a flat bed without using bedrails?: None Help needed moving from lying on your back to sitting on the side of a flat bed without using bedrails?: None Help needed moving to and from a bed to a chair (including a wheelchair)?: None Help needed standing up from a chair using your arms (e.g., wheelchair or bedside chair)?: None Help needed to walk in hospital room?: None Help needed climbing 3-5 steps with a railing? : None 6 Click Score: 24    End of Session   Activity Tolerance: Patient tolerated treatment well Patient left: in bed Nurse Communication: Mobility status PT Visit Diagnosis: Pain Pain - Right/Left:  (abdomen) Pain - part of body:  (abdomen)     Time: [id number] PT Time Calculation (min) (ACUTE ONLY): 15 min  Charges:  $Gait  Training: 8-22 mins                     Greggory Stallion, PT, DPT [phone]    Keondre Markson 12/05/2020, 12:22 PM

## 2020-12-06 LAB — CULTURE, BLOOD (SINGLE)
Culture: NO GROWTH
Culture: NO GROWTH
Special Requests: ADEQUATE

## 2020-12-13 ENCOUNTER — Inpatient Hospital Stay: Payer: Medicare Other | Attending: Obstetrics and Gynecology | Admitting: Obstetrics and Gynecology

## 2020-12-13 ENCOUNTER — Inpatient Hospital Stay (HOSPITAL_BASED_OUTPATIENT_CLINIC_OR_DEPARTMENT_OTHER): Payer: Medicare Other | Admitting: Oncology

## 2020-12-13 ENCOUNTER — Other Ambulatory Visit: Payer: Self-pay

## 2020-12-13 VITALS — BP 107/71 | HR 93 | Temp 97.8°F | Resp 20 | Wt 140.8 lb

## 2020-12-13 VITALS — BP 107/71 | HR 93 | Temp 97.8°F | Resp 20 | Wt 140.9 lb

## 2020-12-13 DIAGNOSIS — R102 Pelvic and perineal pain: Secondary | ICD-10-CM

## 2020-12-13 DIAGNOSIS — D649 Anemia, unspecified: Secondary | ICD-10-CM | POA: Diagnosis not present

## 2020-12-13 DIAGNOSIS — Z6826 Body mass index (BMI) 26.0-26.9, adult: Secondary | ICD-10-CM | POA: Diagnosis not present

## 2020-12-13 DIAGNOSIS — C539 Malignant neoplasm of cervix uteri, unspecified: Secondary | ICD-10-CM

## 2020-12-13 DIAGNOSIS — I10 Essential (primary) hypertension: Secondary | ICD-10-CM | POA: Insufficient documentation

## 2020-12-13 DIAGNOSIS — Z7189 Other specified counseling: Secondary | ICD-10-CM | POA: Diagnosis not present

## 2020-12-13 DIAGNOSIS — E119 Type 2 diabetes mellitus without complications: Secondary | ICD-10-CM | POA: Insufficient documentation

## 2020-12-13 DIAGNOSIS — F1410 Cocaine abuse, uncomplicated: Secondary | ICD-10-CM

## 2020-12-13 DIAGNOSIS — D75839 Thrombocytosis, unspecified: Secondary | ICD-10-CM

## 2020-12-13 DIAGNOSIS — I509 Heart failure, unspecified: Secondary | ICD-10-CM | POA: Insufficient documentation

## 2020-12-13 DIAGNOSIS — F1490 Cocaine use, unspecified, uncomplicated: Secondary | ICD-10-CM | POA: Diagnosis not present

## 2020-12-13 DIAGNOSIS — N838 Other noninflammatory disorders of ovary, fallopian tube and broad ligament: Secondary | ICD-10-CM

## 2020-12-13 DIAGNOSIS — F17210 Nicotine dependence, cigarettes, uncomplicated: Secondary | ICD-10-CM | POA: Insufficient documentation

## 2020-12-13 DIAGNOSIS — I5032 Chronic diastolic (congestive) heart failure: Secondary | ICD-10-CM | POA: Insufficient documentation

## 2020-12-13 DIAGNOSIS — E782 Mixed hyperlipidemia: Secondary | ICD-10-CM | POA: Diagnosis not present

## 2020-12-13 DIAGNOSIS — I872 Venous insufficiency (chronic) (peripheral): Secondary | ICD-10-CM | POA: Insufficient documentation

## 2020-12-13 DIAGNOSIS — D500 Iron deficiency anemia secondary to blood loss (chronic): Secondary | ICD-10-CM

## 2020-12-13 DIAGNOSIS — Z79899 Other long term (current) drug therapy: Secondary | ICD-10-CM | POA: Diagnosis not present

## 2020-12-13 DIAGNOSIS — E8809 Other disorders of plasma-protein metabolism, not elsewhere classified: Secondary | ICD-10-CM

## 2020-12-13 NOTE — Progress Notes (Signed)
Gynecologic Oncology Consult Visit   Referring Provider: Boykin Nearing, MD   Chief Concern: cervical cancer  Subjective:  Rebecca Parker is a 70 y.o. G3P3 female who is seen in consultation from Dr. Ouida Sills for cervical cancer.   12/01/2020-8/9/022 She was admitted from the ED for 1 day h/o right lower abdominal / pelvic pain . CT scan revealed a right ovarian cyst 5 cm and air in endometrial cavity. Bedside exam revealed a firm cervical rim and purulent d/c. WBC 23K concern for pyometra.    12/01/2020 Fx D+C  and cervical bx. Uterus sounded to 9 cm.  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   Initally she was thought of urosepsis however neg urine culture. In retrospect, urine was probably contaminated with purulent cervical discharge. She was treated with gent and Cleocin switched to oral Flagyl and is currently on Augmentin.   Clinically patient's pain improved with IV/ PO abx . Hospitalist helped managed DM and CHF ( no significant tx)  IV access difficult and PICC line was requested if additional IV needed .   She was also diagnosed with anemia. Hypoalbuminemia, and thrombocytosis. Her tox screen was positive for cocaine she says she only uses occasionally.    She presents today for evaluation with her younger sister. She is the oldest of 7 kids (5 girls and 2 boys). She has 3 children - all SVDs - 2 boys and one girl. She has many medical issues and presents to clinic using a rolling walker.      .   Problem List: Patient Active Problem List   Diagnosis Date Noted   Sepsis ([place]) 12/01/2020   Ovarian mass, right 12/01/2020   Acute lower UTI 12/01/2020   Pelvic pain 12/01/2020   Morbid (severe) obesity due to excess calories ([place]) 11/15/2015   Mixed incontinence 10/24/2015   Venous insufficiency of both lower extremities 11/24/2014   Benign  essential hypertension 11/18/2014   Chronic diastolic CHF (congestive heart failure), NYHA class 2 ([place]) 05/17/2014   Mixed hyperlipidemia 05/17/2014   Tobacco dependence 02/10/2014   Thyroid nodule 02/10/2014   Spinal stenosis, lumbar region without neurogenic claudication 03/05/2013   Constipation 01/29/2013   Eosinophil count raised 10/20/2012   Urinary incontinence 10/19/2012   Type 2 diabetes mellitus without complications ([place]) 99991111   Neck pain 06/15/2012   Polyneuropathy 11/15/2011   Palmar fascial fibromatosis 09/13/2011   Edema 05/29/2011   Vitamin D deficiency 07/31/2009   Insomnia 07/27/2009    Past Medical History: Past Medical History:  Diagnosis Date   Chest pain, unspecified    CHF (congestive heart failure) ([place])    Diabetes mellitus without complication ([place])    Hip pain    Hypertension    Multinodular goiter     Past Surgical History: Past Surgical History:  Procedure Laterality Date   COLONOSCOPY WITH PROPOFOL N/A 11/24/2015   Procedure: COLONOSCOPY WITH PROPOFOL;  Surgeon: Lollie Sails, MD;  Location[facility] ENDOSCOPY;  Service: Endoscopy;  Laterality: N/A;   COLONOSCOPY WITH PROPOFOL N/A 03/18/2019   Procedure: COLONOSCOPY WITH PROPOFOL;  Surgeon: Virgel Manifold, MD;  Location: [facility] ENDOSCOPY;  Service: Endoscopy;  Laterality: N/A;   DILATION AND CURETTAGE OF UTERUS N/A 12/01/2020   Procedure: DILATATION AND CURETTAGE with cervical biopsies;  Surgeon: Schermerhorn, Gwen Her, MD;  Location: [facility] ORS;  Service: Gynecology;  Laterality: N/A;  ESOPHAGOGASTRODUODENOSCOPY (EGD) WITH PROPOFOL N/A 03/18/2019   Procedure: ESOPHAGOGASTRODUODENOSCOPY (EGD) WITH PROPOFOL;  Surgeon: Virgel Manifold, MD;  Location: [facility] ENDOSCOPY;  Service: Endoscopy;  Laterality: N/A;   right ankle orif      Past Gynecologic History:  As per HPI. She does not recall dates of menarche, LMP. She does have a h/o abnormal Pap, but last Pap not listed.   OB History:   OB History  No obstetric history on file.    Family History: Family History  Problem Relation Age of Onset   Breast cancer Maternal Aunt    Breast cancer Paternal Aunt     Social History: Social History   Socioeconomic History   Marital status: Legally Separated    Spouse name: Not on file   Number of children: Not on file   Years of education: Not on file   Highest education level: Not on file  Occupational History   Not on file  Tobacco Use   Smoking status: Every Day    Packs/day: 0.10    Types: Cigarettes   Smokeless tobacco: Never  Substance and Sexual Activity   Alcohol use: Not Currently   Drug use: Not Currently   Sexual activity: Not Currently  Other Topics Concern   Not on file  Social History Narrative   Not on file   Social Determinants of Health   Financial Resource Strain: Not on file  Food Insecurity: Not on file  Transportation Needs: Not on file  Physical Activity: Not on file  Stress: Not on file  Social Connections: Not on file  Intimate Partner Violence: Not on file    Allergies: Allergies  Allergen Reactions   Ace Inhibitors Itching    Rash   Cyclobenzaprine Itching    Rash and 'made me nervous'    Current Medications: Current Outpatient Medications  Medication Sig Dispense Refill   amoxicillin-clavulanate (AUGMENTIN) 875-125 MG tablet Take 1 tablet by mouth 2 (two) times daily for 10 days. 20 tablet 0   aspirin EC 81 MG tablet Take 81 mg by mouth daily.     Calcium Carbonate-Vitamin D 500-125 MG-UNIT TABS Take by mouth.     docusate sodium (COLACE) 100 MG capsule Take 100 mg by mouth daily.     Ferrous Sulfate (IRON) 325 (65 Fe) MG TABS Take 1 tablet by mouth 2 (two) times daily.     furosemide (LASIX) 20 MG tablet Take 20 mg by mouth 2 (two) times daily.     gabapentin (NEURONTIN) 800 MG tablet Take 800 mg by mouth 2 (two) times daily.     hydrOXYzine (ATARAX/VISTARIL) 25 MG tablet Take 25 mg by mouth daily. Take '25mg'$ . By  mouth every morning     losartan (COZAAR) 50 MG tablet Take 50 mg by mouth 2 (two) times daily.     metFORMIN (GLUCOPHAGE) 500 MG tablet Take 500 mg by mouth 2 (two) times daily with a meal.     mirtazapine (REMERON) 15 MG tablet Take 15 mg by mouth at bedtime.     ondansetron (ZOFRAN) 4 MG tablet Take 1 tablet (4 mg total) by mouth every 6 (six) hours as needed for nausea. 20 tablet 0   oxyCODONE (OXY IR/ROXICODONE) 5 MG immediate release tablet Take 1 tablet (5 mg total) by mouth every 8 (eight) hours as needed for moderate pain. 15 tablet 0   simvastatin (ZOCOR) 20 MG tablet Take 20 mg by mouth daily.     No current facility-administered medications for this  visit.    Review of Systems General: weight loss/fever vs night sweats  Skin: negative for changes in moles or sores or rash Eyes: negative for changes in vision HEENT: negative for change in hearing, tinnitus, voice changes Pulmonary: negative for dyspnea, orthopnea, productive cough, wheezing Cardiac: negative for palpitations, pain Gastrointestinal: constipation o/w negative for nausea, vomiting, diarrhea, hematemesis, hematochezia Genitourinary/Sexual: incontinence o/w negative for dysuria, retention, hematuria Ob/Gyn:  negative for abnormal bleeding, or pain Musculoskeletal: leg/back pain with walking o/w negative joint pain Hematology: negative for easy bruising, abnormal bleeding Neurologic/Psych: weakness o/w negative for headaches, seizures, paralysis, numbness  Objective:  Physical Examination:  BP 107/71   Pulse 93   Temp 97.8 F (36.6 C)   Resp 20   Wt 140 lb 12.8 oz (63.9 kg)   SpO2 100%   BMI 26.17 kg/m     ECOG Performance Status: 2 - Symptomatic, <50% confined to bed   GENERAL: Patient is a frail appearing female in no acute distress HEENT:  PERRL, neck supple with midline trachea. Thyroid without masses.  NODES:  No cervical, supraclavicular, axillary, or inguinal lymphadenopathy palpated.  LUNGS:   Clear to auscultation bilaterally. But decreased breath sounds HEART:  Regular rate and rhythm.  BACK: No CVAT  ABDOMEN:  Soft, nontender, nondistended. No masses/hernias/ascites.   EXTREMITIES:  No peripheral edema.   NEURO:  Nonfocal. Well oriented.  Appropriate affect.  Pelvic chaperoned by CMA;  Vulva: normal appearing vulva with no masses, tenderness or lesions; Vagina: no gross lesions on exam but difficult to assess left vaginal fornix. On palpations suspect lateral vaginal disease >50% down the vault and disease involving the left fornix. Cervix enlarged >4 cm with grossly obvious tumor and hard to palpation. Uterus - may be enlarged with firm mass on the right aspect versus palpation of the right adnexal mass. Positive parametrial involvement on the right and on the left with disease to or almost to the sidewall on the left. Rectovaginal exam was confirmatory.    Lab Review Labs on site today: Lab Results  Component Value Date   WBC 12.5 (H) 12/05/2020   HGB 8.5 (L) 12/05/2020   HCT 24.3 (L) 12/05/2020   MCV 95.7 12/05/2020   PLT 401 (H) 12/05/2020     Chemistry      Component Value Date/Time   NA 138 12/05/2020 0504   NA 141 02/24/2019 1550   NA 141 11/01/2013 1021   K 4.0 12/05/2020 0504   K 4.0 11/01/2013 1021   CL 106 12/05/2020 0504   CL 109 (H) 11/01/2013 1021   CO2 27 12/05/2020 0504   CO2 27 11/01/2013 1021   BUN 9 12/05/2020 0504   BUN 10 02/24/2019 1550   BUN 8 11/01/2013 1021   CREATININE 1.00 12/05/2020 0504   CREATININE 1.13 11/01/2013 1021      Component Value Date/Time   CALCIUM 7.8 (L) 12/05/2020 0504   CALCIUM 8.4 (L) 11/01/2013 1021   ALKPHOS 74 12/02/2020 0746   ALKPHOS 70 11/01/2013 1021   AST 17 12/02/2020 0746   AST 19 11/01/2013 1021   ALT 10 12/02/2020 0746   ALT 22 11/01/2013 1021   BILITOT 0.5 12/02/2020 0746   BILITOT 0.2 02/24/2019 1550   BILITOT 0.2 11/01/2013 1021     Magnesium 1.7 - 2.4 mg/dL 1.9    HIV Screen 4th Generation  wRfx Non Reactive   Tricyclic, Ur Screen NONE DETECTED NONE DETECTED   Amphetamines, Ur Screen NONE DETECTED NONE DETECTED  MDMA (Ecstasy)Ur Screen NONE DETECTED NONE DETECTED   Cocaine Metabolite,Ur [place] NONE DETECTED POSITIVE Abnormal    Opiate, Ur Screen NONE DETECTED NONE DETECTED   Phencyclidine (PCP) Ur S NONE DETECTED NONE DETECTED   Cannabinoid 50 Ng, Ur [place] NONE DETECTED NONE DETECTED   Barbiturates, Ur Screen NONE DETECTED NONE DETECTED   Benzodiazepine, Ur Scrn NONE DETECTED NONE DETECTED   Methadone Scn, Ur NONE DETECTED NONE DETECTED    Cancer Antigen (CA) 125 0.0 - 38.1 U/mL 27.4    B Natriuretic Peptide 0.0 - 100.0 pg/mL 64.8    Multiple blood cultures No growth  Urine culture <10,000 COLONIES/mL INSIGNIFICANT GROWTH  Performed at [facility] [address], [place], [place] [address]   Radiologic Imaging: EXAM: 12/01/2020 CT ABDOMEN AND PELVIS WITH CONTRAST   TECHNIQUE: Multidetector CT imaging of the abdomen and pelvis was performed using the standard protocol following bolus administration of intravenous contrast.   CONTRAST:  28m OMNIPAQUE IOHEXOL 350 MG/ML SOLN   COMPARISON:  11/04/2018   FINDINGS: Lower chest: No acute abnormality.   Hepatobiliary: No solid liver abnormality is seen. No gallstones, gallbladder wall thickening, or biliary dilatation.   Pancreas: Unremarkable. No pancreatic ductal dilatation or surrounding inflammatory changes.   Spleen: Normal in size without significant abnormality.   Adrenals/Urinary Tract: Adrenal glands are unremarkable. Kidneys are normal, without renal calculi, solid lesion, or hydronephrosis. Bladder is unremarkable.   Stomach/Bowel: Stomach is within normal limits. Appendix appears normal. No evidence of bowel wall thickening, distention, or inflammatory changes. Pancolonic diverticulosis.   Vascular/Lymphatic: Aortic atherosclerosis. No enlarged abdominal or pelvic lymph nodes.   Reproductive:  Air within the fundal endometrial cavity (series 6, image 86). There is a new, thickly septated right ovarian mass measuring 5.1 x 5.1 cm (series 2, image 64).   Other: No abdominal wall hernia or abnormality. No abdominopelvic ascites.   Musculoskeletal: No acute or significant osseous findings.   IMPRESSION: 1. There is a new, thickly septated right ovarian mass measuring 5.1 x 5.1 cm. 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 Recommend initial pelvic ultrasound and likely subsequent MRI to further evaluate, which may be performed on a nonemergent basis. 2. Air within the fundal endometrial cavity. Correlate for recent instrumentation. 3. Pancolonic diverticulosis without evidence of acute diverticulitis.    CXR 12/01/2020 IMPRESSION: Cardiac enlargement without acute cardiopulmonary abnormality.    Assessment:  RGIONNA ECKEis a 70y.o. female diagnosed with locally advanced cervical cancer with right ovarian mass which may represent metastatic disease versus benign ovarian cyst/TOA/separate primary (the latter is unlikely).   Infection: Pyometra possible except pathology did not mention any plasma cell or evidence of endometritis. Pyelonephritis unlikely given culture results. Suspect TOA with excellent response to antibiotic therapy and currently on Augmentin.   Anemia, currently on iron therapy Thrombocytosis Hypoalbuminemia  Performance status 2   Medical co-morbidities complicating care: Body mass index is 26.17 kg/m. Positive cocaine use. Type 2 diabetes mellitus without complication; h/o sepsis; HTN; Chronic diastolic CHF (congestive heart failure), NYHA class 2 (HCC)  Plan:   Problem List Items Addressed This Visit   None Visit Diagnoses     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[place]    -  Primary   Relevant Orders   Ambulatory referral to Radiation Oncology   NM PET Image Initial (PI) Skull Base To Thigh       We discussed options for management and  recommended chemotherapy with radiation. She is seeing Dr. YTasia Catchingstoday and has  an appt scheduled with Dr. Baruch Gouty. PET scan ordered.   Once we have her PET results we can determine her prognosis and provide a final treatment plan.   Follow up after completion of initial therapy and discuss at Tumor Board.    Continue antibiotics and we had a frank discussion regarding cocaine use.   The patient's diagnosis, an outline of the further diagnostic and laboratory studies which will be required, the recommendation, and alternatives were discussed.  All questions were answered to the patient's satisfaction.  A total of 75 minutes were spent with the patient/family today; >50% was spent in education, counseling and coordination of care for cervical cancer.    Rudolph Daoust Gaetana Michaelis, MD

## 2020-12-14 ENCOUNTER — Other Ambulatory Visit: Payer: Self-pay

## 2020-12-18 ENCOUNTER — Inpatient Hospital Stay: Payer: Medicare Other

## 2020-12-20 ENCOUNTER — Encounter: Payer: Self-pay | Admitting: Radiation Oncology

## 2020-12-20 ENCOUNTER — Ambulatory Visit
Admission: RE | Admit: 2020-12-20 | Discharge: 2020-12-20 | Disposition: A | Discharge status: Home / Self Care | Payer: Medicare Other | Source: Ambulatory Visit | Attending: Radiation Oncology | Admitting: Radiation Oncology

## 2020-12-20 VITALS — BP 136/85 | HR 82 | Temp 97.9°F | Wt 144.0 lb

## 2020-12-20 DIAGNOSIS — F17210 Nicotine dependence, cigarettes, uncomplicated: Secondary | ICD-10-CM | POA: Diagnosis not present

## 2020-12-20 DIAGNOSIS — G629 Polyneuropathy, unspecified: Secondary | ICD-10-CM | POA: Diagnosis not present

## 2020-12-20 DIAGNOSIS — Z79899 Other long term (current) drug therapy: Secondary | ICD-10-CM | POA: Diagnosis not present

## 2020-12-20 DIAGNOSIS — R1903 Right lower quadrant abdominal swelling, mass and lump: Secondary | ICD-10-CM | POA: Insufficient documentation

## 2020-12-20 DIAGNOSIS — Z803 Family history of malignant neoplasm of breast: Secondary | ICD-10-CM | POA: Insufficient documentation

## 2020-12-20 DIAGNOSIS — D75839 Thrombocytosis, unspecified: Secondary | ICD-10-CM | POA: Diagnosis not present

## 2020-12-20 DIAGNOSIS — E119 Type 2 diabetes mellitus without complications: Secondary | ICD-10-CM | POA: Diagnosis not present

## 2020-12-20 DIAGNOSIS — Z792 Long term (current) use of antibiotics: Secondary | ICD-10-CM | POA: Diagnosis not present

## 2020-12-20 DIAGNOSIS — E8809 Other disorders of plasma-protein metabolism, not elsewhere classified: Secondary | ICD-10-CM | POA: Insufficient documentation

## 2020-12-20 DIAGNOSIS — E042 Nontoxic multinodular goiter: Secondary | ICD-10-CM | POA: Insufficient documentation

## 2020-12-20 DIAGNOSIS — D649 Anemia, unspecified: Secondary | ICD-10-CM | POA: Insufficient documentation

## 2020-12-20 DIAGNOSIS — I509 Heart failure, unspecified: Secondary | ICD-10-CM | POA: Insufficient documentation

## 2020-12-20 DIAGNOSIS — C539 Malignant neoplasm of cervix uteri, unspecified: Secondary | ICD-10-CM

## 2020-12-20 DIAGNOSIS — Z7984 Long term (current) use of oral hypoglycemic drugs: Secondary | ICD-10-CM | POA: Insufficient documentation

## 2020-12-20 NOTE — Consult Note (Signed)
NEW PATIENT EVALUATION  Name: Rebecca Parker  MRN: [id number]  Date:   12/20/2020     DOB: 03/26/1951   This 70 y.o. female patient presents to the clinic for initial evaluation of squamous cell carcinoma the cervix and patient yet not staged to have PET CT scan tomorrow although has possible local advanced disease with 5 cm right ovarian mass.  REFERRING PHYSICIAN: [facility], [place]:  Chief Complaint  Patient presents with   Consult    DIAGNOSIS: The encounter diagnosis was 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[place]).   PREVIOUS INVESTIGATIONS:  Pathology report reviewed Clinical notes reviewed Imaging reviewed  HPI: Patient is a 70 year old female gravida 3 para 3 referred from Dr. Ouida Sills for evaluation of cervical cancer.  Patient saw Dr. Theora Gianotti pelvic exam revealed a firm cervical rim with purulent discharge.  D&C and cervical biopsy showed squamous cell carcinoma of the cervix with cancer in endocervical curettings.  CT scan demonstrated a thickly septated right ovarian mass measuring 5.1 x 5.1 cm concerning for ovarian neoplasm.  She is scheduled for PET CT scan tomorrow.  Her comorbidities include hypoalbuminemia thrombocytosis and anemia.  She also has congestive heart failure type 2 diabetes polyneuropathy.  Dr. Theora Gianotti has recommended possible chemoradiation and will make for a final determination after her PET CT scan is reviewed.  PLANNED TREATMENT REGIMEN: Possible concurrent chemoradiation  PAST MEDICAL HISTORY:  has a past medical history of Chest pain, unspecified, CHF (congestive heart failure) ([place]), Diabetes mellitus without complication ([place]), Hip pain, Hypertension, and Multinodular goiter.    PAST SURGICAL HISTORY:  Past Surgical History:  Procedure Laterality Date   COLONOSCOPY WITH PROPOFOL N/A 11/24/2015   Procedure: COLONOSCOPY WITH PROPOFOL;  Surgeon: Lollie Sails, MD;  Location: [facility] ENDOSCOPY;  Service: Endoscopy;  Laterality: N/A;    COLONOSCOPY WITH PROPOFOL N/A 03/18/2019   Procedure: COLONOSCOPY WITH PROPOFOL;  Surgeon: Virgel Manifold, MD;  Location: [facility] ENDOSCOPY;  Service: Endoscopy;  Laterality: N/A;   DILATION AND CURETTAGE OF UTERUS N/A 12/01/2020   Procedure: DILATATION AND CURETTAGE with cervical biopsies;  Surgeon: Schermerhorn, Gwen Her, MD;  Location: [facility] ORS;  Service: Gynecology;  Laterality: N/A;   ESOPHAGOGASTRODUODENOSCOPY (EGD) WITH PROPOFOL N/A 03/18/2019   Procedure: ESOPHAGOGASTRODUODENOSCOPY (EGD) WITH PROPOFOL;  Surgeon: Virgel Manifold, MD;  Location: [facility] ENDOSCOPY;  Service: Endoscopy;  Laterality: N/A;   right ankle orif      FAMILY HISTORY: family history includes Breast cancer in her maternal aunt and paternal aunt.  SOCIAL HISTORY:  reports that she has been smoking cigarettes. She has been smoking an average of .1 packs per day. She has never used smokeless tobacco. She reports that she does not currently use alcohol. She reports that she does not currently use drugs.  ALLERGIES: Ace inhibitors and Cyclobenzaprine  MEDICATIONS:  Current Outpatient Medications  Medication Sig Dispense Refill   aspirin EC 81 MG tablet Take 81 mg by mouth daily.     Calcium Carbonate-Vitamin D 500-125 MG-UNIT TABS Take by mouth.     docusate sodium (COLACE) 100 MG capsule Take 100 mg by mouth daily.     Ferrous Sulfate (IRON) 325 (65 Fe) MG TABS Take 1 tablet by mouth 2 (two) times daily.     furosemide (LASIX) 20 MG tablet Take 20 mg by mouth 2 (two) times daily.     gabapentin (NEURONTIN) 800 MG tablet Take 800 mg by mouth 2 (two) times daily.     hydrOXYzine (ATARAX/VISTARIL) 25 MG  tablet Take 25 mg by mouth daily. Take '25mg'$ . By mouth every morning     losartan (COZAAR) 50 MG tablet Take 50 mg by mouth 2 (two) times daily.     metFORMIN (GLUCOPHAGE) 500 MG tablet Take 500 mg by mouth 2 (two) times daily with a meal.     mirtazapine (REMERON) 15 MG tablet Take 15 mg by mouth at bedtime.      ondansetron (ZOFRAN) 4 MG tablet Take 1 tablet (4 mg total) by mouth every 6 (six) hours as needed for nausea. 20 tablet 0   oxyCODONE (OXY IR/ROXICODONE) 5 MG immediate release tablet Take 1 tablet (5 mg total) by mouth every 8 (eight) hours as needed for moderate pain. 15 tablet 0   simvastatin (ZOCOR) 20 MG tablet Take 20 mg by mouth daily.     No current facility-administered medications for this encounter.    ECOG PERFORMANCE STATUS:  2 - Symptomatic, <50% confined to bed  REVIEW OF SYSTEMS: Patient denies any weight loss, fatigue, weakness, fever, chills or night sweats. Patient denies any loss of vision, blurred vision. Patient denies any ringing  of the ears or hearing loss. No irregular heartbeat. Patient denies heart murmur or history of fainting. Patient denies any chest pain or pain radiating to her upper extremities. Patient denies any shortness of breath, difficulty breathing at night, cough or hemoptysis. Patient denies any swelling in the lower legs. Patient denies any nausea vomiting, vomiting of blood, or coffee ground material in the vomitus. Patient denies any stomach pain. Patient states has had normal bowel movements no significant constipation or diarrhea. Patient denies any dysuria, hematuria or significant nocturia. Patient denies any problems walking, swelling in the joints or loss of balance. Patient denies any skin changes, loss of hair or loss of weight. Patient denies any excessive worrying or anxiety or significant depression. Patient denies any problems with insomnia. Patient denies excessive thirst, polyuria, polydipsia. Patient denies any swollen glands, patient denies easy bruising or easy bleeding. Patient denies any recent infections, allergies or URI. Patient "s visual fields have not changed significantly in recent time.   PHYSICAL EXAM: BP 136/85   Pulse 82   Temp 97.9 F (36.6 C) (Tympanic)   Wt 144 lb (65.3 kg)   BMI 26.77 kg/m  Well-developed  well-nourished patient in NAD. HEENT reveals PERLA, EOMI, discs not visualized.  Oral cavity is clear. No oral mucosal lesions are identified. Neck is clear without evidence of cervical or supraclavicular adenopathy. Lungs are clear to A&P. Cardiac examination is essentially unremarkable with regular rate and rhythm without murmur rub or thrill. Abdomen is benign with no organomegaly or masses noted. Motor sensory and DTR levels are equal and symmetric in the upper and lower extremities. Cranial nerves II through XII are grossly intact. Proprioception is intact. No peripheral adenopathy or edema is identified. No motor or sensory levels are noted. Crude visual fields are within normal range.  LABORATORY DATA: Pathology report reviewed    RADIOLOGY RESULTS: CT scan reviewed PET CT scan to be reviewed when performed   IMPRESSION: Probable locally advanced squamous cell carcinoma the cervix in 70 year old female with multiple comorbidities for possible chemoradiation.  PLAN: At this time I will review her PET CT scan when it comes available.  I have already scheduled her for CT simulation next week in anticipation that we will be proceeding with chemoradiation upfront.  Risks and benefits of radiation including increased lower urinary tract symptoms diarrhea fatigue alteration of blood counts  skin reaction all were reviewed with the patient.  We will make final recommendations after PET CT scan is reviewed and discussion with other providers.  Patient seems to comprehend my recommendations well.  I would like to take this opportunity to thank you for allowing me to participate in the care of your patient.Noreene Filbert, MD

## 2020-12-21 ENCOUNTER — Other Ambulatory Visit: Payer: Self-pay

## 2020-12-21 ENCOUNTER — Ambulatory Visit
Admission: RE | Admit: 2020-12-21 | Discharge: 2020-12-21 | Disposition: A | Discharge status: Home / Self Care | Payer: Medicare Other | Source: Ambulatory Visit | Attending: Obstetrics and Gynecology | Admitting: Obstetrics and Gynecology

## 2020-12-21 DIAGNOSIS — C539 Malignant neoplasm of cervix uteri, unspecified: Secondary | ICD-10-CM

## 2020-12-23 ENCOUNTER — Encounter: Payer: Self-pay | Admitting: Oncology

## 2020-12-23 DIAGNOSIS — F1410 Cocaine abuse, uncomplicated: Secondary | ICD-10-CM | POA: Insufficient documentation

## 2020-12-23 DIAGNOSIS — Z7189 Other specified counseling: Secondary | ICD-10-CM | POA: Insufficient documentation

## 2020-12-23 DIAGNOSIS — D649 Anemia, unspecified: Secondary | ICD-10-CM | POA: Insufficient documentation

## 2020-12-23 DIAGNOSIS — C539 Malignant neoplasm of cervix uteri, unspecified: Secondary | ICD-10-CM | POA: Insufficient documentation

## 2020-12-23 NOTE — Progress Notes (Signed)
Hematology/Oncology Consult note [facility] [phone] [phone]   Patient Care Team: [facility], [facility] as PCP - General (General Practice) Clent Jacks, RN as Oncology Nurse Navigator  REFERRING PROVIDER: Center, [place] COMPLAINTS/REASON FOR VISIT:  Evaluation of cervical cancer  HISTORY OF PRESENTING ILLNESS:   Rebecca Parker is a  70 y.o.  female with PMH listed below was seen in consultation at the request of  Center, Jenny Reichmann*  for evaluation of cervical cancer   12/01/2020-8/9/022 She was admitted from the ED for 1 day h/o right lower abdominal / pelvic pain . 12/01/2020 CT abdomen pelvis w contrast showed  thickly septated right ovarian mass measuring 5.1 x 5.1 cm. 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 Recommend initial pelvic ultrasound and likely subsequent MRI to further evaluate, which may be performed on a nonemergent basis. 2. Air within the fundal endometrial cavity. Correlate for recent instrumentation. 3. Pancolonic diverticulosis without evidence of acute diverticulitis.  12/01/2020 Fx D+C  and cervical bx. Uterus sounded to 9 cm.   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   Diastolic CHF and Diabetes.  UDS + for cocaine.   Today she is accompanied by her sister.  She is the oldest of 7 kids (5 girls and 2 boys). She has 3 children - all SVDs - 2 boys and one girl.  She was seen by GynOnc Dr.Secord.  Her pelvic examination showed On palpations suspect lateral vaginal disease >50% down the vault and disease involving the left fornix. Cervix enlarged >4 cm with grossly  obvious tumor and hard to palpation. Uterus - may be enlarged with firm mass on the right aspect versus palpation of the right adnexal mass. Positive parametrial involvement on the right and on the left with disease to or almost to the sidewall on the left. Rectovaginal exam was confirmatory.    Review of Systems  Constitutional:  Positive for fatigue. Negative for appetite change, chills and fever.  HENT:   Negative for hearing loss and voice change.   Eyes:  Negative for eye problems.  Respiratory:  Negative for chest tightness and cough.   Cardiovascular:  Negative for chest pain.  Gastrointestinal:  Negative for abdominal distention, abdominal pain and blood in stool.  Endocrine: Negative for hot flashes.  Genitourinary:  Negative for difficulty urinating and frequency.   Musculoskeletal:  Negative for arthralgias.  Skin:  Negative for itching and rash.  Neurological:  Negative for extremity weakness.  Hematological:  Negative for adenopathy.  Psychiatric/Behavioral:  Negative for confusion.    MEDICAL HISTORY:  Past Medical History:  Diagnosis Date   Chest pain, unspecified    CHF (congestive heart failure) ([place])    Diabetes mellitus without complication (HCC)    Hip pain    Hypertension    Multinodular goiter     SURGICAL HISTORY: Past Surgical History:  Procedure Laterality Date   COLONOSCOPY WITH PROPOFOL N/A 11/24/2015   Procedure: COLONOSCOPY WITH PROPOFOL;  Surgeon: Lollie Sails, MD;  Location: [facility] ENDOSCOPY;  Service: Endoscopy;  Laterality: N/A;   COLONOSCOPY WITH PROPOFOL N/A 03/18/2019   Procedure: COLONOSCOPY WITH PROPOFOL;  Surgeon: Virgel Manifold, MD;  Location: [facility] ENDOSCOPY;  Service: Endoscopy;  Laterality: N/A;   DILATION AND CURETTAGE OF UTERUS N/A 12/01/2020   Procedure: DILATATION AND CURETTAGE with cervical biopsies;  Surgeon: Schermerhorn, Gwen Her, MD;  Location: [facility] ORS;  Service: Gynecology;  Laterality: N/A;   ESOPHAGOGASTRODUODENOSCOPY  (EGD) WITH PROPOFOL N/A 03/18/2019   Procedure: ESOPHAGOGASTRODUODENOSCOPY (EGD) WITH PROPOFOL;  Surgeon: Virgel Manifold, MD;  Location: [facility] ENDOSCOPY;  Service: Endoscopy;  Laterality: N/A;   right ankle orif      SOCIAL HISTORY: Social History   Socioeconomic History   Marital status: Legally Separated    Spouse name: Not on file   Number of children: Not on file   Years of education: Not on file   Highest education level: Not on file  Occupational History   Not on file  Tobacco Use   Smoking status: Every Day    Packs/day: 0.10    Types: Cigarettes   Smokeless tobacco: Never  Substance and Sexual Activity   Alcohol use: Not Currently   Drug use: Not Currently   Sexual activity: Not Currently  Other Topics Concern   Not on file  Social History Narrative   Not on file   Social Determinants of Health   Financial Resource Strain: Not on file  Food Insecurity: Not on file  Transportation Needs: Not on file  Physical Activity: Not on file  Stress: Not on file  Social Connections: Not on file  Intimate Partner Violence: Not on file    FAMILY HISTORY: Family History  Problem Relation Age of Onset   Breast cancer Maternal Aunt    Breast cancer Paternal Aunt     ALLERGIES:  is allergic to ace inhibitors and cyclobenzaprine.  MEDICATIONS:  Current Outpatient Medications  Medication Sig Dispense Refill   aspirin EC 81 MG tablet Take 81 mg by mouth daily.     Calcium Carbonate-Vitamin D 500-125 MG-UNIT TABS Take by mouth.     docusate sodium (COLACE) 100 MG capsule Take 100 mg by mouth daily.     Ferrous Sulfate (IRON) 325 (65 Fe) MG TABS Take 1 tablet by mouth 2 (two) times daily.     furosemide (LASIX) 20 MG tablet Take 20 mg by mouth 2 (two) times daily.     gabapentin (NEURONTIN) 800 MG tablet Take 800 mg by mouth 2 (two) times daily.     hydrOXYzine (ATARAX/VISTARIL) 25 MG tablet Take 25 mg by mouth daily. Take '25mg'$ . By mouth every morning     losartan  (COZAAR) 50 MG tablet Take 50 mg by mouth 2 (two) times daily.     metFORMIN (GLUCOPHAGE) 500 MG tablet Take 500 mg by mouth 2 (two) times daily with a meal.     mirtazapine (REMERON) 15 MG tablet Take 15 mg by mouth at bedtime.     ondansetron (ZOFRAN) 4 MG tablet Take 1 tablet (4 mg total) by mouth every 6 (six) hours as needed for nausea. 20 tablet 0   oxyCODONE (OXY IR/ROXICODONE) 5 MG immediate release tablet Take 1 tablet (5 mg total) by mouth every 8 (eight) hours as needed for moderate pain. 15 tablet 0   simvastatin (ZOCOR) 20 MG tablet Take 20 mg by mouth daily.     No current facility-administered medications for this visit.     PHYSICAL EXAMINATION: ECOG PERFORMANCE STATUS: 2 - Symptomatic, <50% confined to bed Vitals:   12/13/20 1136  BP: 107/71  Pulse: 93  Resp: 20  Temp: 97.8 F (36.6 C)  SpO2:  100%   Filed Weights   12/13/20 1136  Weight: 140 lb 14 oz (63.9 kg)    Physical Exam Constitutional:      General: She is not in acute distress.    Comments: Walks with a walker  HENT:     Head: Normocephalic and atraumatic.  Eyes:     General: No scleral icterus. Cardiovascular:     Rate and Rhythm: Normal rate and regular rhythm.     Heart sounds: Normal heart sounds.  Pulmonary:     Effort: Pulmonary effort is normal. No respiratory distress.     Breath sounds: No wheezing.  Abdominal:     General: Bowel sounds are normal. There is no distension.     Palpations: Abdomen is soft.  Musculoskeletal:        General: No deformity. Normal range of motion.     Cervical back: Normal range of motion and neck supple.  Skin:    General: Skin is warm and dry.     Findings: No erythema or rash.  Neurological:     Mental Status: She is alert and oriented to person, place, and time. Mental status is at baseline.     Cranial Nerves: No cranial nerve deficit.     Coordination: Coordination normal.  Psychiatric:        Mood and Affect: Mood normal.    LABORATORY DATA:   I have reviewed the data as listed Lab Results  Component Value Date   WBC 12.5 (H) 12/05/2020   HGB 8.5 (L) 12/05/2020   HCT 24.3 (L) 12/05/2020   MCV 95.7 12/05/2020   PLT 401 (H) 12/05/2020   Recent Labs    12/02/20 0746 12/03/20 0636 12/04/20 0517 12/05/20 0504  NA 138 138 139 138  K 4.7 4.2 4.2 4.0  CL 106 107 110 106  CO2 '24 27 26 27  '$ GLUCOSE 80 89 83 86  BUN '18 17 14 9  '$ CREATININE 1.07* 1.28* 1.07* 1.00  CALCIUM 7.9* 7.9* 7.8* 7.8*  GFRNONAA 55* 45* 55* 60*  PROT 6.5  --   --   --   ALBUMIN 1.8*  --   --   --   AST 17  --   --   --   ALT 10  --   --   --   ALKPHOS 74  --   --   --   BILITOT 0.5  --   --   --    Iron/TIBC/Ferritin/ %Sat    Component Value Date/Time   IRON 90 02/24/2019 1550   TIBC 292 02/24/2019 1550   FERRITIN 33 02/24/2019 1550   IRONPCTSAT 31 02/24/2019 1550      RADIOGRAPHIC STUDIES: I have personally reviewed the radiological images as listed and agreed with the findings in the report. CT ABDOMEN PELVIS W CONTRAST  Result Date: 12/01/2020 CLINICAL DATA:  Lower abdominal pain for 24 hours EXAM: CT ABDOMEN AND PELVIS WITH CONTRAST TECHNIQUE: Multidetector CT imaging of the abdomen and pelvis was performed using the standard protocol following bolus administration of intravenous contrast. CONTRAST:  15m OMNIPAQUE IOHEXOL 350 MG/ML SOLN COMPARISON:  11/04/2018 FINDINGS: Lower chest: No acute abnormality. Hepatobiliary: No solid liver abnormality is seen. No gallstones, gallbladder wall thickening, or biliary dilatation. Pancreas: Unremarkable. No pancreatic ductal dilatation or surrounding inflammatory changes. Spleen: Normal in size without significant abnormality. Adrenals/Urinary Tract: Adrenal glands are unremarkable. Kidneys are normal, without renal calculi, solid lesion, or hydronephrosis. Bladder is unremarkable. Stomach/Bowel: Stomach is within normal  limits. Appendix appears normal. No evidence of bowel wall thickening, distention, or  inflammatory changes. Pancolonic diverticulosis. Vascular/Lymphatic: Aortic atherosclerosis. No enlarged abdominal or pelvic lymph nodes. Reproductive: Air within the fundal endometrial cavity (series 6, image 86). There is a new, thickly septated right ovarian mass measuring 5.1 x 5.1 cm (series 2, image 64). Other: No abdominal wall hernia or abnormality. No abdominopelvic ascites. Musculoskeletal: No acute or significant osseous findings. IMPRESSION: 1. There is a new, thickly septated right ovarian mass measuring 5.1 x 5.1 cm. 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 Recommend initial pelvic ultrasound and likely subsequent MRI to further evaluate, which may be performed on a nonemergent basis. 2. Air within the fundal endometrial cavity. Correlate for recent instrumentation. 3. Pancolonic diverticulosis without evidence of acute diverticulitis. Aortic Atherosclerosis (ICD10-I70.0). Electronically Signed   By: Eddie Candle M.D.   On: 12/01/2020 15:48   DG Chest Port 1 View  Result Date: 12/01/2020 CLINICAL DATA:  Question sepsis.  Short of breath. EXAM: PORTABLE CHEST 1 VIEW COMPARISON:  11/04/2018 FINDINGS: Cardiac enlargement. Negative for heart failure or edema. Lungs are clear without infiltrate or effusion. IMPRESSION: Cardiac enlargement without acute cardiopulmonary abnormality. Electronically Signed   By: Franchot Gallo M.D.   On: 12/01/2020 12:52   US PELVIC COMPLETE WITH TRANSVAGINAL  Result Date: 12/02/2020 CLINICAL DATA:  Pelvic pain and right ovarian mass seen on prior CT. EXAM: TRANSABDOMINAL AND TRANSVAGINAL ULTRASOUND OF PELVIS TECHNIQUE: Both transabdominal and transvaginal ultrasound examinations of the pelvis were performed. Transabdominal technique was performed for global imaging of the pelvis including uterus, ovaries, adnexal regions, and pelvic cul-de-sac. It was necessary to proceed with endovaginal exam following the transabdominal exam to visualize the adnexa. COMPARISON:   CT abdomen pelvis dated 12/01/2020. FINDINGS: The exam is limited due to overlying bowel gas and the patient's ability to tolerate the exam due to pain. Uterus Measurements: 7.5 x 3.2 x 4.2 cm = volume: 52 mL. No fibroids or other mass visualized. Endometrium Not well visualized. Right ovary Measurements: 5.2 x 4.8 x 5.0 cm = volume: 65 mL. A complex cystic mass with irregular septations measuring greater than 3 mm is difficult to differentiate from the normal ovarian tissue. A hypoechoic component of this mass measures 3.2 x 3.7 x 2.4 cm and may represent cystic components with debris versus solid tissue. Left ovary Not visualized. Other findings No abnormal free fluid. IMPRESSION: Right adnexal mass with irregular septations and possible solid component is worrisome for malignancy. Non emergent MR pelvis and surgical consultation should be considered for further evaluation. Electronically Signed   By: Zerita Boers M.D.   On: 12/02/2020 12:00      ASSESSMENT & PLAN:  1. 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[place])   2. Normocytic anemia   3. Goals of care, counseling/discussion   4. Cocaine abuse [facility])    Images are reviewed and discussed with patient, her sister, and also with her daughter over the phone.  Patient has multiple commodities including obesity, cocaine use, CHF, DM, recent history of sepsis.  Agree with PET scan to complete staging. Per GynOnc she is not a surgical candidate.  If no distant metastatic disease, will recommend concurrent radiation with chemotherapy- Cisplatin.  We discussed about rationale and potential side effects of chemotherapy.  Chemotherapy class, baseline audiogram.  Recommend patient to complete her current antibiotics course.   Lack of IV access, consider medi port placement.  Anemia, check cbc, cmp, iron tibc ferritin, b12, folate.  Cocaine abuse,  check hepatitis  panel, HIV  Orders Placed This Encounter  Procedures   CBC with Differential/Platelet    Standing  Status:   Future    Standing Expiration Date:   12/13/2021   Comprehensive metabolic panel    Standing Status:   Future    Standing Expiration Date:   12/13/2021   Iron and TIBC    Standing Status:   Future    Standing Expiration Date:   12/13/2021   Ferritin    Standing Status:   Future    Standing Expiration Date:   12/13/2021   Retic Panel    Standing Status:   Future    Standing Expiration Date:   12/13/2021    All questions were answered. The patient knows to call the clinic with any problems questions or concerns.  cc Center, [facility]    Return of visit: to be determined.  Thank you for this kind referral and the opportunity to participate in the care of this patient. A copy of today's note is routed to referring provider    Earlie Server, MD, PhD Hematology Oncology [facility] Pager- [phone] 12/23/2020

## 2020-12-23 NOTE — Progress Notes (Signed)
START OFF PATHWAY REGIMEN - Other   [id number]:Cisplatin 40 mg/m2 IV D1 q7 Days + RT:   A cycle is every 7 days:     Cisplatin   **Always confirm dose/schedule in your pharmacy ordering system**  Patient Characteristics: Intent of Therapy: Curative Intent, Discussed with Patient 

## 2020-12-25 ENCOUNTER — Encounter: Payer: Self-pay | Admitting: Otolaryngology

## 2020-12-26 ENCOUNTER — Inpatient Hospital Stay: Payer: Medicare Other

## 2020-12-26 ENCOUNTER — Ambulatory Visit
Admission: RE | Admit: 2020-12-26 | Discharge: 2020-12-26 | Disposition: A | Discharge status: Home / Self Care | Payer: Medicare Other | Source: Ambulatory Visit | Attending: Radiation Oncology | Admitting: Radiation Oncology

## 2020-12-26 DIAGNOSIS — C539 Malignant neoplasm of cervix uteri, unspecified: Secondary | ICD-10-CM | POA: Insufficient documentation

## 2020-12-26 DIAGNOSIS — Z510 Encounter for antineoplastic radiation therapy: Secondary | ICD-10-CM | POA: Diagnosis not present

## 2020-12-26 LAB — COMPREHENSIVE METABOLIC PANEL
ALT: 12 U/L (ref 0–44)
AST: 17 U/L (ref 15–41)
Albumin: 2.7 g/dL — ABNORMAL LOW (ref 3.5–5.0)
Alkaline Phosphatase: 57 U/L (ref 38–126)
Anion gap: 8 (ref 5–15)
BUN: 11 mg/dL (ref 8–23)
CO2: 24 mmol/L (ref 22–32)
Calcium: 8.5 mg/dL — ABNORMAL LOW (ref 8.9–10.3)
Chloride: 106 mmol/L (ref 98–111)
Creatinine, Ser: 1.2 mg/dL — ABNORMAL HIGH (ref 0.44–1.00)
GFR, Estimated: 48 mL/min — ABNORMAL LOW (ref >60)
Glucose, Bld: 86 mg/dL (ref 70–99)
Potassium: 3.8 mmol/L (ref 3.5–5.1)
Sodium: 138 mmol/L (ref 135–145)
Total Bilirubin: 0.4 mg/dL (ref 0.3–1.2)
Total Protein: 7.1 g/dL (ref 6.5–8.1)

## 2020-12-26 LAB — CBC WITH DIFFERENTIAL/PLATELET
Abs Immature Granulocytes: 0.03 10*3/uL (ref 0.00–0.07)
Basophils Absolute: 0 10*3/uL (ref 0.0–0.1)
Basophils Relative: 1 %
Eosinophils Absolute: 0.3 10*3/uL (ref 0.0–0.5)
Eosinophils Relative: 3 %
HCT: 28.6 % — ABNORMAL LOW (ref 36.0–46.0)
Hemoglobin: 9.7 g/dL — ABNORMAL LOW (ref 12.0–15.0)
Immature Granulocytes: 0 %
Lymphocytes Relative: 31 %
Lymphs Abs: 2.3 10*3/uL (ref 0.7–4.0)
MCH: 34.8 pg — ABNORMAL HIGH (ref 26.0–34.0)
MCHC: 33.9 g/dL (ref 30.0–36.0)
MCV: 102.5 fL — ABNORMAL HIGH (ref 80.0–100.0)
Monocytes Absolute: 0.6 10*3/uL (ref 0.1–1.0)
Monocytes Relative: 8 %
Neutro Abs: 4.2 10*3/uL (ref 1.7–7.7)
Neutrophils Relative %: 57 %
Platelets: 493 10*3/uL — ABNORMAL HIGH (ref 150–400)
RBC: 2.79 MIL/uL — ABNORMAL LOW (ref 3.87–5.11)
RDW: 19 % — ABNORMAL HIGH (ref 11.5–15.5)
WBC: 7.4 10*3/uL (ref 4.0–10.5)
nRBC: 0 % (ref 0.0–0.2)

## 2020-12-26 LAB — IRON AND TIBC
Iron: 85 ug/dL (ref 28–170)
Saturation Ratios: 42 % — ABNORMAL HIGH (ref 10.4–31.8)
TIBC: 204 ug/dL — ABNORMAL LOW (ref 250–450)
UIBC: 119 ug/dL

## 2020-12-26 LAB — RETIC PANEL
Immature Retic Fract: 20.8 % — ABNORMAL HIGH (ref 2.3–15.9)
RBC.: 2.8 MIL/uL — ABNORMAL LOW (ref 3.87–5.11)
Retic Count, Absolute: 83.4 10*3/uL (ref 19.0–186.0)
Retic Ct Pct: 3 % (ref 0.4–3.1)
Reticulocyte Hemoglobin: 39 pg (ref >27.9)

## 2020-12-26 LAB — FERRITIN: Ferritin: 25 ng/mL (ref 11–307)

## 2020-12-27 ENCOUNTER — Ambulatory Visit
Admission: RE | Admit: 2020-12-27 | Discharge: 2020-12-27 | Disposition: A | Discharge status: Home / Self Care | Payer: Medicare Other | Source: Ambulatory Visit | Attending: Obstetrics and Gynecology | Admitting: Obstetrics and Gynecology

## 2020-12-27 DIAGNOSIS — E041 Nontoxic single thyroid nodule: Secondary | ICD-10-CM | POA: Insufficient documentation

## 2020-12-27 DIAGNOSIS — Z7984 Long term (current) use of oral hypoglycemic drugs: Secondary | ICD-10-CM | POA: Insufficient documentation

## 2020-12-27 DIAGNOSIS — C539 Malignant neoplasm of cervix uteri, unspecified: Secondary | ICD-10-CM | POA: Insufficient documentation

## 2020-12-27 DIAGNOSIS — E119 Type 2 diabetes mellitus without complications: Secondary | ICD-10-CM | POA: Diagnosis not present

## 2020-12-27 DIAGNOSIS — I700 Atherosclerosis of aorta: Secondary | ICD-10-CM | POA: Insufficient documentation

## 2020-12-27 LAB — GLUCOSE, CAPILLARY: Glucose-Capillary: 78 mg/dL (ref 70–99)

## 2020-12-27 MED ORDER — FLUDEOXYGLUCOSE F - 18 (FDG) INJECTION
7.5000 | Freq: Once | INTRAVENOUS | Status: AC | PRN
  Administered 2020-12-27: 8.1 via INTRAVENOUS

## 2020-12-28 DIAGNOSIS — C539 Malignant neoplasm of cervix uteri, unspecified: Secondary | ICD-10-CM | POA: Diagnosis not present

## 2020-12-28 DIAGNOSIS — Z510 Encounter for antineoplastic radiation therapy: Secondary | ICD-10-CM | POA: Diagnosis not present

## 2020-12-29 ENCOUNTER — Other Ambulatory Visit: Payer: Self-pay

## 2020-12-29 ENCOUNTER — Telehealth: Payer: Self-pay

## 2020-12-29 DIAGNOSIS — C539 Malignant neoplasm of cervix uteri, unspecified: Secondary | ICD-10-CM

## 2020-12-29 NOTE — Telephone Encounter (Signed)
Call placed to Ms. Genna to review PET results and notifiy of need for ENT referral. No answer. No voicemail.

## 2021-01-02 ENCOUNTER — Ambulatory Visit: Admission: RE | Admit: 2021-01-02 | Payer: Medicare Other | Source: Ambulatory Visit

## 2021-01-02 DIAGNOSIS — C539 Malignant neoplasm of cervix uteri, unspecified: Secondary | ICD-10-CM | POA: Diagnosis not present

## 2021-01-03 ENCOUNTER — Inpatient Hospital Stay: Payer: Medicare Other | Attending: Oncology

## 2021-01-03 ENCOUNTER — Encounter: Payer: Self-pay | Admitting: Oncology

## 2021-01-03 ENCOUNTER — Inpatient Hospital Stay: Payer: Medicare Other

## 2021-01-03 ENCOUNTER — Other Ambulatory Visit: Payer: Self-pay

## 2021-01-03 ENCOUNTER — Ambulatory Visit: Payer: Medicare Other

## 2021-01-03 ENCOUNTER — Inpatient Hospital Stay (HOSPITAL_BASED_OUTPATIENT_CLINIC_OR_DEPARTMENT_OTHER): Payer: Medicare Other | Admitting: Oncology

## 2021-01-03 ENCOUNTER — Other Ambulatory Visit: Payer: Self-pay | Admitting: Oncology

## 2021-01-03 ENCOUNTER — Ambulatory Visit: Admission: RE | Admit: 2021-01-03 | Payer: Medicare Other | Source: Ambulatory Visit

## 2021-01-03 VITALS — BP 128/78 | HR 84 | Temp 98.3°F | Resp 17 | Wt 137.0 lb

## 2021-01-03 DIAGNOSIS — Z5111 Encounter for antineoplastic chemotherapy: Secondary | ICD-10-CM | POA: Insufficient documentation

## 2021-01-03 DIAGNOSIS — D649 Anemia, unspecified: Secondary | ICD-10-CM

## 2021-01-03 DIAGNOSIS — E538 Deficiency of other specified B group vitamins: Secondary | ICD-10-CM | POA: Insufficient documentation

## 2021-01-03 DIAGNOSIS — D518 Other vitamin B12 deficiency anemias: Secondary | ICD-10-CM

## 2021-01-03 DIAGNOSIS — C539 Malignant neoplasm of cervix uteri, unspecified: Secondary | ICD-10-CM | POA: Insufficient documentation

## 2021-01-03 DIAGNOSIS — Z79899 Other long term (current) drug therapy: Secondary | ICD-10-CM | POA: Insufficient documentation

## 2021-01-03 DIAGNOSIS — Z720 Tobacco use: Secondary | ICD-10-CM | POA: Insufficient documentation

## 2021-01-03 DIAGNOSIS — Z7189 Other specified counseling: Secondary | ICD-10-CM

## 2021-01-03 DIAGNOSIS — R948 Abnormal results of function studies of other organs and systems: Secondary | ICD-10-CM

## 2021-01-03 DIAGNOSIS — D519 Vitamin B12 deficiency anemia, unspecified: Secondary | ICD-10-CM

## 2021-01-03 DIAGNOSIS — N1832 Chronic kidney disease, stage 3b: Secondary | ICD-10-CM

## 2021-01-03 HISTORY — DX: Deficiency of other specified B group vitamins: E53.8

## 2021-01-03 HISTORY — DX: Vitamin B12 deficiency anemia, unspecified: D51.9

## 2021-01-03 LAB — CBC WITH DIFFERENTIAL/PLATELET
Abs Immature Granulocytes: 0.03 10*3/uL (ref 0.00–0.07)
Basophils Absolute: 0 10*3/uL (ref 0.0–0.1)
Basophils Relative: 0 %
Eosinophils Absolute: 0.5 10*3/uL (ref 0.0–0.5)
Eosinophils Relative: 5 %
HCT: 32 % — ABNORMAL LOW (ref 36.0–46.0)
Hemoglobin: 10.8 g/dL — ABNORMAL LOW (ref 12.0–15.0)
Immature Granulocytes: 0 %
Lymphocytes Relative: 29 %
Lymphs Abs: 2.7 10*3/uL (ref 0.7–4.0)
MCH: 35.5 pg — ABNORMAL HIGH (ref 26.0–34.0)
MCHC: 33.8 g/dL (ref 30.0–36.0)
MCV: 105.3 fL — ABNORMAL HIGH (ref 80.0–100.0)
Monocytes Absolute: 0.9 10*3/uL (ref 0.1–1.0)
Monocytes Relative: 9 %
Neutro Abs: 5.2 10*3/uL (ref 1.7–7.7)
Neutrophils Relative %: 57 %
Platelets: 413 10*3/uL — ABNORMAL HIGH (ref 150–400)
RBC: 3.04 MIL/uL — ABNORMAL LOW (ref 3.87–5.11)
RDW: 18.3 % — ABNORMAL HIGH (ref 11.5–15.5)
WBC: 9.3 10*3/uL (ref 4.0–10.5)
nRBC: 0 % (ref 0.0–0.2)

## 2021-01-03 LAB — COMPREHENSIVE METABOLIC PANEL
ALT: 12 U/L (ref 0–44)
AST: 19 U/L (ref 15–41)
Albumin: 2.9 g/dL — ABNORMAL LOW (ref 3.5–5.0)
Alkaline Phosphatase: 64 U/L (ref 38–126)
Anion gap: 6 (ref 5–15)
BUN: 11 mg/dL (ref 8–23)
CO2: 26 mmol/L (ref 22–32)
Calcium: 8.6 mg/dL — ABNORMAL LOW (ref 8.9–10.3)
Chloride: 105 mmol/L (ref 98–111)
Creatinine, Ser: 1.33 mg/dL — ABNORMAL HIGH (ref 0.44–1.00)
GFR, Estimated: 43 mL/min — ABNORMAL LOW (ref >60)
Glucose, Bld: 82 mg/dL (ref 70–99)
Potassium: 3.9 mmol/L (ref 3.5–5.1)
Sodium: 137 mmol/L (ref 135–145)
Total Bilirubin: 0.1 mg/dL — ABNORMAL LOW (ref 0.3–1.2)
Total Protein: 7.6 g/dL (ref 6.5–8.1)

## 2021-01-03 LAB — FOLATE: Folate: 7.6 ng/mL (ref >5.9)

## 2021-01-03 LAB — VITAMIN B12: Vitamin B-12: 91 pg/mL — ABNORMAL LOW (ref 180–914)

## 2021-01-03 LAB — MAGNESIUM: Magnesium: 2.1 mg/dL (ref 1.7–2.4)

## 2021-01-03 MED ORDER — MAGNESIUM SULFATE 2 GM/50ML IV SOLN
2.0000 g | Freq: Once | INTRAVENOUS | Status: AC
Start: 2021-01-03 — End: 2021-01-03
  Administered 2021-01-03: 2 g via INTRAVENOUS
  Filled 2021-01-03: qty 50

## 2021-01-03 MED ORDER — SODIUM CHLORIDE 0.9 % IV SOLN: Freq: Once | INTRAVENOUS | Status: DC

## 2021-01-03 MED ORDER — POTASSIUM CHLORIDE IN NACL 20-0.9 MEQ/L-% IV SOLN
INTRAVENOUS | Status: DC
  Filled 2021-01-03 (×2): qty 1000

## 2021-01-03 MED ORDER — PALONOSETRON HCL INJECTION 0.25 MG/5ML
0.2500 mg | Freq: Once | INTRAVENOUS | Status: AC
  Administered 2021-01-03: 0.25 mg via INTRAVENOUS
  Filled 2021-01-03: qty 5

## 2021-01-03 MED ORDER — SODIUM CHLORIDE 0.9 % IV SOLN
150.0000 mg | Freq: Once | INTRAVENOUS | Status: AC
  Administered 2021-01-03: 150 mg via INTRAVENOUS
  Filled 2021-01-03: qty 150

## 2021-01-03 MED ORDER — PROCHLORPERAZINE MALEATE 10 MG PO TABS: 10.0000 mg | ORAL_TABLET | Freq: Four times a day (QID) | ORAL | 1 refills | Status: DC | PRN

## 2021-01-03 MED ORDER — LIDOCAINE-PRILOCAINE 2.5-2.5 % EX CREA: TOPICAL_CREAM | CUTANEOUS | 3 refills | Status: DC

## 2021-01-03 MED ORDER — SODIUM CHLORIDE 0.9 % IV SOLN
Freq: Once | INTRAVENOUS | Status: AC
  Filled 2021-01-03: qty 250

## 2021-01-03 MED ORDER — SODIUM CHLORIDE 0.9 % IV SOLN
10.0000 mg | Freq: Once | INTRAVENOUS | Status: AC
  Administered 2021-01-03: 10 mg via INTRAVENOUS
  Filled 2021-01-03: qty 10

## 2021-01-03 MED ORDER — SODIUM CHLORIDE 0.9 % IV SOLN
20.0000 mg/m2 | Freq: Once | INTRAVENOUS | Status: AC
  Administered 2021-01-03: 33 mg via INTRAVENOUS
  Filled 2021-01-03: qty 33

## 2021-01-03 NOTE — Progress Notes (Signed)
Referral sent to ENT for abnormal PET uptake right linguinal tonsil and glossotonsillar sulcus uptake and right thyroid uptake with visible nodule.

## 2021-01-03 NOTE — Progress Notes (Signed)
OFF PATHWAY REGIMEN - Other  No Change  Continue With Treatment as Ordered.  Original Decision Date/Time: 12/23/2020 13:22   [id number]:Cisplatin 40 mg/m2 IV D1 q7 Days + RT:   A cycle is every 7 days:     Cisplatin   **Always confirm dose/schedule in your pharmacy ordering system**  Patient Characteristics: Intent of Therapy: Curative Intent, Discussed with Patient

## 2021-01-03 NOTE — Progress Notes (Signed)
Hematology/Oncology progress note [facility] [phone] [phone]   Patient Care Team: Center, [facility] as PCP - General (General Practice) Clent Jacks, RN as Oncology Nurse Navigator Earlie Server, MD as Consulting Physician (Oncology) Earlie Server, MD as Consulting Physician (Hematology and Oncology)  REFERRING PROVIDER: Center, [place] VISIT:  Follow up for treatment of cervical cancer  HISTORY OF PRESENTING ILLNESS:   Rebecca Parker is a  70 y.o.  female with PMH listed below was seen in consultation at the request of  Center, Jenny Reichmann*  for evaluation of cervical cancer   12/01/2020-8/9/022 She was admitted from the ED for 1 day h/o right lower abdominal / pelvic pain . 12/01/2020 CT abdomen pelvis w contrast showed  thickly septated right ovarian mass measuring 5.1 x 5.1 cm. 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 Recommend initial pelvic ultrasound and likely subsequent MRI to further evaluate, which may be performed on a nonemergent basis. 2. Air within the fundal endometrial cavity. Correlate for recent instrumentation. 3. Pancolonic diverticulosis without evidence of acute diverticulitis.  12/01/2020 Fx D+C  and cervical bx. Uterus sounded to 9 cm.   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   Diastolic CHF and Diabetes.  UDS + for cocaine.   Today she is accompanied by her sister.  She is the oldest of 7 kids (5 girls and 2 boys). She has 3 children - all SVDs - 2 boys and one girl.  She was seen by GynOnc Dr.Secord.  Her pelvic examination showed On palpations  suspect lateral vaginal disease >50% down the vault and disease involving the left fornix. Cervix enlarged >4 cm with grossly obvious tumor and hard to palpation. Uterus - may be enlarged with firm mass on the right aspect versus palpation of the right adnexal mass. Positive parametrial involvement on the right and on the left with disease to or almost to the sidewall on the left. Rectovaginal exam was confirmatory.     INTERVAL HISTORY Rebecca Parker is a 70 y.o. female who has above history reviewed by me today presents for follow up visit for treatment of cervix cancer. Problems and complaints are listed below: 8/31 2022, PET scan showed signs of cervical cancer with diffuse involvement of the uterus.  Cystic and solid right ovarian lesion more likely related to diffuse cervical cancer.  Concomitant synchronous cystic ovarian neoplasm could have appearance but feel less likely given the diffuse nature of disease throughout the uterus and cervix.  Small lymph nodes in the pelvis in the left pelvis suspicious for nodal involvement at the common iliac level.  No solid organ distant metastasis. Right lung renal consult and glossotonsillar sulcus uptake which is asymmetric and with increased fullness.  While this may be physiologic, will suggest direct visualization for further evaluation to exclude neoplasm. Right thyroid uptake with visible nodule.  Recommend ultrasound thyroid and biopsy.  Today patient denies any fever, chills, vaginal discharge.  She has completed antibiotic course.  She was accompanied by her sister.   Review of Systems  Constitutional:  Positive for fatigue. Negative for appetite change, chills and fever.  HENT:   Negative for hearing  loss and voice change.   Eyes:  Negative for eye problems.  Respiratory:  Negative for chest tightness and cough.   Cardiovascular:  Negative for chest pain.  Gastrointestinal:  Negative for abdominal distention, abdominal pain and blood in  stool.  Endocrine: Negative for hot flashes.  Genitourinary:  Negative for difficulty urinating and frequency.   Musculoskeletal:  Negative for arthralgias.  Skin:  Negative for itching and rash.  Neurological:  Negative for extremity weakness.  Hematological:  Negative for adenopathy.  Psychiatric/Behavioral:  Negative for confusion.    MEDICAL HISTORY:  Past Medical History:  Diagnosis Date   Chest pain, unspecified    CHF (congestive heart failure) ([place])    Diabetes mellitus without complication (HCC)    Hip pain    Hypertension    Multinodular goiter     SURGICAL HISTORY: Past Surgical History:  Procedure Laterality Date   COLONOSCOPY WITH PROPOFOL N/A 11/24/2015   Procedure: COLONOSCOPY WITH PROPOFOL;  Surgeon: Lollie Sails, MD;  [facility];  Service: Endoscopy;  Laterality: N/A;   COLONOSCOPY WITH PROPOFOL N/A 03/18/2019   Procedure: COLONOSCOPY WITH PROPOFOL;  Surgeon: Virgel Manifold, MD;  Location: [facility] ENDOSCOPY;  Service: Endoscopy;  Laterality: N/A;   DILATION AND CURETTAGE OF UTERUS N/A 12/01/2020   Procedure: DILATATION AND CURETTAGE with cervical biopsies;  Surgeon: Schermerhorn, Gwen Her, MD;  Location: [facility] ORS;  Service: Gynecology;  Laterality: N/A;   ESOPHAGOGASTRODUODENOSCOPY (EGD) WITH PROPOFOL N/A 03/18/2019   Procedure: ESOPHAGOGASTRODUODENOSCOPY (EGD) WITH PROPOFOL;  Surgeon: Virgel Manifold, MD;  Location: [facility] ENDOSCOPY;  Service: Endoscopy;  Laterality: N/A;   right ankle orif      SOCIAL HISTORY: Social History   Socioeconomic History   Marital status: Legally Separated    Spouse name: Not on file   Number of children: Not on file   Years of education: Not on file   Highest education level: Not on file  Occupational History   Not on file  Tobacco Use   Smoking status: Every Day    Packs/day: 0.10    Types: Cigarettes   Smokeless tobacco: Never  Substance and Sexual Activity   Alcohol use: Not Currently   Drug  use: Not on file   Sexual activity: Not Currently  Other Topics Concern   Not on file  Social History Narrative   Not on file   Social Determinants of Health   Financial Resource Strain: Not on file  Food Insecurity: Not on file  Transportation Needs: Not on file  Physical Activity: Not on file  Stress: Not on file  Social Connections: Not on file  Intimate Partner Violence: Not on file    FAMILY HISTORY: Family History  Problem Relation Age of Onset   Breast cancer Maternal Aunt    Breast cancer Paternal Aunt     ALLERGIES:  is allergic to ace inhibitors and cyclobenzaprine.  MEDICATIONS:  Current Outpatient Medications  Medication Sig Dispense Refill   aspirin EC 81 MG tablet Take 81 mg by mouth daily.     Calcium Carbonate-Vitamin D 500-125 MG-UNIT TABS Take by mouth.     docusate sodium (COLACE) 100 MG capsule Take 100 mg by mouth daily.     Ferrous Sulfate (IRON) 325 (65 Fe) MG TABS Take 1 tablet by mouth 2 (two) times daily.     furosemide (LASIX) 20 MG tablet Take 20 mg by mouth 2 (two) times daily.     gabapentin (NEURONTIN) 800 MG tablet Take 800 mg by  mouth 2 (two) times daily.     hydrOXYzine (ATARAX/VISTARIL) 25 MG tablet Take 25 mg by mouth daily. Take '25mg'$ . By mouth every morning     losartan (COZAAR) 50 MG tablet Take 50 mg by mouth 2 (two) times daily.     metFORMIN (GLUCOPHAGE) 500 MG tablet Take 500 mg by mouth 2 (two) times daily with a meal.     mirtazapine (REMERON) 15 MG tablet Take 15 mg by mouth at bedtime.     ondansetron (ZOFRAN) 4 MG tablet Take 1 tablet (4 mg total) by mouth every 6 (six) hours as needed for nausea. 20 tablet 0   oxyCODONE (OXY IR/ROXICODONE) 5 MG immediate release tablet Take 1 tablet (5 mg total) by mouth every 8 (eight) hours as needed for moderate pain. 15 tablet 0   simvastatin (ZOCOR) 20 MG tablet Take 20 mg by mouth daily.     prochlorperazine (COMPAZINE) 10 MG tablet Take 1 tablet (10 mg total) by mouth every 6 (six)  hours as needed (Nausea or vomiting). 30 tablet 1   No current facility-administered medications for this visit.   Facility-Administered Medications Ordered in Other Visits  Medication Dose Route Frequency Provider Last Rate Last Admin   0.9 % NaCl with KCl 20 mEq/ L  infusion   Intravenous Continuous Earlie Server, MD 250 mL/hr at 01/03/21 1043 New Bag at 01/03/21 1043     PHYSICAL EXAMINATION: ECOG PERFORMANCE STATUS: 2 - Symptomatic, <50% confined to bed Vitals:   01/03/21 0846  BP: 128/78  Pulse: 84  Resp: 17  Temp: 98.3 F (36.8 C)  SpO2: 100%   Filed Weights   01/03/21 0846  Weight: 137 lb (62.1 kg)    Physical Exam Constitutional:      General: She is not in acute distress.    Comments: Walks with a walker  HENT:     Head: Normocephalic and atraumatic.  Eyes:     General: No scleral icterus. Cardiovascular:     Rate and Rhythm: Normal rate and regular rhythm.     Heart sounds: Normal heart sounds.  Pulmonary:     Effort: Pulmonary effort is normal. No respiratory distress.     Breath sounds: No wheezing.  Abdominal:     General: Bowel sounds are normal. There is no distension.     Palpations: Abdomen is soft.  Musculoskeletal:        General: No deformity. Normal range of motion.     Cervical back: Normal range of motion and neck supple.  Skin:    General: Skin is warm and dry.     Findings: No erythema or rash.  Neurological:     Mental Status: She is alert and oriented to person, place, and time. Mental status is at baseline.     Cranial Nerves: No cranial nerve deficit.     Coordination: Coordination normal.  Psychiatric:        Mood and Affect: Mood normal.    LABORATORY DATA:  I have reviewed the data as listed Lab Results  Component Value Date   WBC 9.3 01/03/2021   HGB 10.8 (L) 01/03/2021   HCT 32.0 (L) 01/03/2021   MCV 105.3 (H) 01/03/2021   PLT 413 (H) 01/03/2021   Recent Labs    12/02/20 0746 12/03/20 0636 12/05/20 0504 12/26/20 0846  01/03/21 0807  NA 138   < > 138 138 137  K 4.7   < > 4.0 3.8 3.9  CL 106   < >  106 106 105  CO2 24   < > '27 24 26  '$ GLUCOSE 80   < > 86 86 82  BUN 18   < > '9 11 11  '$ CREATININE 1.07*   < > 1.00 1.20* 1.33*  CALCIUM 7.9*   < > 7.8* 8.5* 8.6*  GFRNONAA 55*   < > 60* 48* 43*  PROT 6.5  --   --  7.1 7.6  ALBUMIN 1.8*  --   --  2.7* 2.9*  AST 17  --   --  17 19  ALT 10  --   --  12 12  ALKPHOS 74  --   --  57 64  BILITOT 0.5  --   --  0.4 0.1*   < > = values in this interval not displayed.    Iron/TIBC/Ferritin/ %Sat    Component Value Date/Time   IRON 85 12/26/2020 0846   IRON 90 02/24/2019 1550   TIBC 204 (L) 12/26/2020 0846   TIBC 292 02/24/2019 1550   FERRITIN 25 12/26/2020 0846   FERRITIN 33 02/24/2019 1550   IRONPCTSAT 42 (H) 12/26/2020 0846   IRONPCTSAT 31 02/24/2019 1550       RADIOGRAPHIC STUDIES: I have personally reviewed the radiological images as listed and agreed with the findings in the report. 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 70 year old female. EXAM: NUCLEAR MEDICINE PET SKULL BASE TO THIGH TECHNIQUE: 8.1 mCi F-18 FDG was injected intravenously. Full-ring PET imaging was performed from the skull base to thigh after the radiotracer. CT data was obtained and used for attenuation correction and anatomic localization. Fasting blood glucose: 76 mg/dl COMPARISON:  CT abdomen and pelvis of August of 2022. FINDINGS: Mediastinal blood pool activity: SUV max 2.1 Liver activity: SUV max NA NECK: Asymmetric fullness of RIGHT glossotonsillar sulcus and activity associated with this area on FDG PET maximum SUV 7.6 (image 38/3) contralateral activity 5.0 no discrete adenopathy in the neck. Asymmetric thyroid activity with nodule in the RIGHT thyroid showing maximum SUV of 5.9 (image 58/3) nodule measuring up to 2.8 cm. Incidental CT findings: none  CHEST: RIGHT thyroid abnormality as described. Some nodularity of the LEFT thyroid. No signs of hypermetabolic lymph nodes or suspicious pulmonary nodules in the chest. Lymph nodes with fatty hila in the LEFT axilla maximum SUV of 2.5 Incidental CT findings: Aortic atherosclerosis. Three-vessel coronary artery disease. No consolidation or evidence of pleural effusion. ABDOMEN/PELVIS: Diffuse activity throughout the uterus and cervix with a maximum SUV of 16.3. (Image 221/3) cervix is bulky measuring 6.3 x 4.0 cm at this level. Activity extends throughout the entire uterus. Multi cystic RIGHT adnexal lesion shows increased metabolic activity at the lateral margin with a maximum SUV of 6.7 (image 204/3) 6.2 x 4.5 cm. Small LEFT external iliac lymph nodes and common iliac lymph nodes are noted, most hypermetabolic with a maximum SUV of 4.1 measuring 7 mm (image 181/3 Incidental CT findings: No acute findings relative to liver, gallbladder, pancreas, spleen, adrenal glands, kidneys, stomach, small or large bowel. No pericecal stranding. Appendix not visualized. Colonic diverticulosis. SKELETON: No focal hypermetabolic activity to suggest skeletal metastasis. Incidental CT findings: Spinal degenerative changes. IMPRESSION: Signs of cervical cancer with diffuse involvement of the uterus. Cystic and solid RIGHT ovarian lesion more likely related to diffuse cervical cancer. Concomitant synchronous cystic ovarian neoplasm could have this appearance but is felt less  likely given the diffuse nature of disease throughout the uterus and cervix. Small lymph nodes in the pelvis in the LEFT pelvis suspicious for nodal involvement at the common iliac level. No solid organ or distant metastasis. RIGHT lingual tonsil and glossotonsillar sulcus uptake which is asymmetric and with increased fullness. While this may be physiologic would suggest direct visualization for further evaluation to exclude neoplasm in this area in the head and  neck. RIGHT thyroid uptake with visible nodule. Recommend thyroid US and biopsy (ref: J Am Coll Radiol. 2015 Feb;12(2): 143-50). Aortic Atherosclerosis (ICD10-I70.0). Electronically Signed   By: Zetta Bills M.D.   On: 12/28/2020 18:06      ASSESSMENT & PLAN:  1. 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[place])   2. Goals of care, counseling/discussion   3. Normocytic anemia   4. Encounter for antineoplastic chemotherapy   5. Vitamin B12 deficiency   6. Stage 3b chronic kidney disease (HCC)    #Locally advanced cervical cancer.  Stage III Not a surgical candidate. Patient has had baseline audiogram done and chemotherapy class. She is starting radiation today. Labs reviewed and discussed with patient. Proceed with cisplatin 20 mg/m2 today.  Dose were reduced to 50% due to her kidney function. I will switch to carboplatin for future treatments.  Difficulty of obtaining IV access, chemo RN managed to have an IV access after multiple attempts today.  Will refer patient to get Mediport placement by vascular surgeon.    #Macrocytic anemia, hemoglobin is improving.  Today at 10.8, macrocytic. Vitamin B12 was checked was significantly low at 91.  We will start patient on vitamin B12 injection daily x5.   Orders Placed This Encounter  Procedures   Magnesium    Standing Status:   Future    Number of Occurrences:   1    Standing Expiration Date:   01/03/2022   Ambulatory referral to Vascular Surgery    Referral Priority:   Routine    Referral Type:   Surgical    Referral Reason:   Specialty Services Required    Referred to Provider:   Algernon Huxley, MD    Requested Specialty:   Vascular Surgery    Number of Visits Requested:   1    All questions were answered. The patient knows to call the clinic with any problems questions or concerns.  cc Center, [place]    Return of visit: 1 week, lab MD carboplatin   Earlie Server, MD, PhD Hematology Oncology [place] [facility]  01/03/2021

## 2021-01-03 NOTE — Progress Notes (Signed)
Patient here for oncology follow-up appointment, concerns of loss of taste

## 2021-01-03 NOTE — Progress Notes (Signed)
Per Janeann Merl RN per Dr. Tasia Catchings proceed with treatment with 01/03/21 labs and VS, no Magnesium level needed at this time.

## 2021-01-03 NOTE — Patient Instructions (Signed)
[facility]  Discharge Instructions: Thank you for choosing [facility] to provide your oncology and hematology care.  If you have a lab appointment with the [facility], please go directly to the [facility] and check in at the registration area.  Wear comfortable clothing and clothing appropriate for easy access to any Portacath or PICC line.   We strive to give you quality time with your provider. You may need to reschedule your appointment if you arrive late (15 or more minutes).  Arriving late affects you and other patients whose appointments are after yours.  Also, if you miss three or more appointments without notifying the office, you may be dismissed from the clinic at the provider's discretion.      For prescription refill requests, have your pharmacy contact our office and allow 72 hours for refills to be completed.    Today you received the following chemotherapy and/or immunotherapy agents Cisplatin       To help prevent nausea and vomiting after your treatment, we encourage you to take your nausea medication as directed.  BELOW ARE SYMPTOMS THAT SHOULD BE REPORTED IMMEDIATELY: *FEVER GREATER THAN 100.4 F (38 C) OR HIGHER *CHILLS OR SWEATING *NAUSEA AND VOMITING THAT IS NOT CONTROLLED WITH YOUR NAUSEA MEDICATION *UNUSUAL SHORTNESS OF BREATH *UNUSUAL BRUISING OR BLEEDING *URINARY PROBLEMS (pain or burning when urinating, or frequent urination) *BOWEL PROBLEMS (unusual diarrhea, constipation, pain near the anus) TENDERNESS IN MOUTH AND THROAT WITH OR WITHOUT PRESENCE OF ULCERS (sore throat, sores in mouth, or a toothache) UNUSUAL RASH, SWELLING OR PAIN  UNUSUAL VAGINAL DISCHARGE OR ITCHING   Items with * indicate a potential emergency and should be followed up as soon as possible or go to the Emergency Department if any problems should occur.  Please show the CHEMOTHERAPY ALERT CARD or IMMUNOTHERAPY ALERT CARD at check-in  to the Emergency Department and triage nurse.  Should you have questions after your visit or need to cancel or reschedule your appointment, please contact [facility]  [phone] and follow the prompts.  Office hours are 8:00 a.m. to 4:30 p.m. Monday - Friday. Please note that voicemails left after 4:00 p.m. may not be returned until the following business day.  We are closed weekends and major holidays. You have access to a nurse at all times for urgent questions. Please call the main number to the clinic [phone] and follow the prompts.  For any non-urgent questions, you may also contact your provider using MyChart. We now offer e-Visits for anyone 62 and older to request care online for non-urgent symptoms. For details visit [email or web address].   Also download the MyChart app! Go to the app store, search "MyChart", open the app, select [facility], and log in with your MyChart username and password.  Due to Covid, a mask is required upon entering the hospital/clinic. If you do not have a mask, one will be given to you upon arrival. For doctor visits, patients may have 1 support person aged 15 or older with them. For treatment visits, patients cannot have anyone with them due to current Covid guidelines and our immunocompromised population.     Cisplatin injection What is this medication? CISPLATIN (SIS pla tin) is a chemotherapy drug. It targets fast dividing cells, like cancer cells, and causes these cells to die. This medicine is used to treat many types of cancer like bladder, ovarian, and testicular cancers. This medicine may be used for other purposes;  ask your health care provider or pharmacist if you have questions. COMMON BRAND NAME(S): Platinol, Platinol -AQ What should I tell my care team before I take this medication? They need to know if you have any of these conditions: eye disease, vision problems hearing problems kidney  disease low blood counts, like white cells, platelets, or red blood cells tingling of the fingers or toes, or other nerve disorder an unusual or allergic reaction to cisplatin, carboplatin, oxaliplatin, other medicines, foods, dyes, or preservatives pregnant or trying to get pregnant breast-feeding How should I use this medication? This drug is given as an infusion into a vein. It is administered in a hospital or clinic by a specially trained health care professional. Talk to your pediatrician regarding the use of this medicine in children. Special care may be needed. Overdosage: If you think you have taken too much of this medicine contact a poison control center or emergency room at once. NOTE: This medicine is only for you. Do not share this medicine with others. What if I miss a dose? It is important not to miss a dose. Call your doctor or health care professional if you are unable to keep an appointment. What may interact with this medication? This medicine may interact with the following medications: foscarnet certain antibiotics like amikacin, gentamicin, neomycin, polymyxin B, streptomycin, tobramycin, vancomycin This list may not describe all possible interactions. Give your health care provider a list of all the medicines, herbs, non-prescription drugs, or dietary supplements you use. Also tell them if you smoke, drink alcohol, or use illegal drugs. Some items may interact with your medicine. What should I watch for while using this medication? Your condition will be monitored carefully while you are receiving this medicine. You will need important blood work done while you are taking this medicine. This drug may make you feel generally unwell. This is not uncommon, as chemotherapy can affect healthy cells as well as cancer cells. Report any side effects. Continue your course of treatment even though you feel ill unless your doctor tells you to stop. This medicine may increase your  risk of getting an infection. Call your healthcare professional for advice if you get a fever, chills, or sore throat, or other symptoms of a cold or flu. Do not treat yourself. Try to avoid being around people who are sick. Avoid taking medicines that contain aspirin, acetaminophen, ibuprofen, naproxen, or ketoprofen unless instructed by your healthcare professional. These medicines may hide a fever. This medicine may increase your risk to bruise or bleed. Call your doctor or health care professional if you notice any unusual bleeding. Be careful brushing and flossing your teeth or using a toothpick because you may get an infection or bleed more easily. If you have any dental work done, tell your dentist you are receiving this medicine. Do not become pregnant while taking this medicine or for 14 months after stopping it. Women should inform their healthcare professional if they wish to become pregnant or think they might be pregnant. Men should not father a child while taking this medicine and for 11 months after stopping it. There is potential for serious side effects to an unborn child. Talk to your healthcare professional for more information. Do not breast-feed an infant while taking this medicine. This medicine has caused ovarian failure in some women. This medicine may make it more difficult to get pregnant. Talk to your healthcare professional if you are concerned about your fertility. This medicine has caused decreased sperm counts  in some men. This may make it more difficult to father a child. Talk to your healthcare professional if you are concerned about your fertility. Drink fluids as directed while you are taking this medicine. This will help protect your kidneys. Call your doctor or health care professional if you get diarrhea. Do not treat yourself. What side effects may I notice from receiving this medication? Side effects that you should report to your doctor or health care professional  as soon as possible: allergic reactions like skin rash, itching or hives, swelling of the face, lips, or tongue blurred vision changes in vision decreased hearing or ringing of the ears nausea, vomiting pain, redness, or irritation at site where injected pain, tingling, numbness in the hands or feet signs and symptoms of bleeding such as bloody or black, tarry stools; red or dark brown urine; spitting up blood or brown material that looks like coffee grounds; red spots on the skin; unusual bruising or bleeding from the eyes, gums, or nose signs and symptoms of infection like fever; chills; cough; sore throat; pain or trouble passing urine signs and symptoms of kidney injury like trouble passing urine or change in the amount of urine signs and symptoms of low red blood cells or anemia such as unusually weak or tired; feeling faint or lightheaded; falls; breathing problems Side effects that usually do not require medical attention (report to your doctor or health care professional if they continue or are bothersome): loss of appetite mouth sores muscle cramps This list may not describe all possible side effects. Call your doctor for medical advice about side effects. You may report side effects to FDA at 1-800-FDA-1088. Where should I keep my medication? This drug is given in a hospital or clinic and will not be stored at home. NOTE: This sheet is a summary. It may not cover all possible information. If you have questions about this medicine, talk to your doctor, pharmacist, or health care provider.  2022 Elsevier/Gold Standard (2018-04-10 15:59:17)    Fosaprepitant injection What is this medication? FOSAPREPITANT (fos ap RE pi tant) is used together with other medicines to prevent nausea and vomiting caused by cancer treatment (chemotherapy). This medicine may be used for other purposes; ask your health care provider or pharmacist if you have questions. COMMON BRAND NAME(S): Emend What  should I tell my care team before I take this medication? They need to know if you have any of these conditions: liver disease an unusual or allergic reaction to fosaprepitant, aprepitant, medicines, foods, dyes, or preservatives pregnant or trying to get pregnant breast-feeding How should I use this medication? This medicine is for injection into a vein. It is given by a health care professional in a hospital or clinic setting. Talk to your pediatrician regarding the use of this medicine in children. While this drug may be prescribed for children as young as 6 months for selected conditions, precautions do apply. Overdosage: If you think you have taken too much of this medicine contact a poison control center or emergency room at once. NOTE: This medicine is only for you. Do not share this medicine with others. What if I miss a dose? This does not apply. What may interact with this medication? Do not take this medicine with any of these medicines: cisapride flibanserin lomitapide pimozide This medicine may also interact with the following medications: diltiazem female hormones, like estrogens or progestins and birth control pills medicines for fungal infections like ketoconazole and itraconazole medicines for HIV medicines for  seizures or to control epilepsy like carbamazepine or phenytoin medicines used for sleep or anxiety disorders like alprazolam, diazepam, or midazolam nefazodone paroxetine ranolazine rifampin some chemotherapy medications like etoposide, ifosfamide, vinblastine, vincristine some antibiotics like clarithromycin, erythromycin, troleandomycin steroid medicines like dexamethasone or methylprednisolone tolbutamide warfarin This list may not describe all possible interactions. Give your health care provider a list of all the medicines, herbs, non-prescription drugs, or dietary supplements you use. Also tell them if you smoke, drink alcohol, or use illegal drugs.  Some items may interact with your medicine. What should I watch for while using this medication? Do not take this medicine if you already have nausea and vomiting. Ask your health care provider what to do if you already have nausea. Birth control pills and other methods of hormonal contraception (for example, IUD or patch) may not work properly while you are taking this medicine. Use an extra method of birth control during treatment and for 1 month after your last dose of fosaprepitant. This medicine should not be used continuously for a long time. Visit your doctor or health care professional for regular check-ups. This medicine may change your liver function blood test results. What side effects may I notice from receiving this medication? Side effects that you should report to your doctor or health care professional as soon as possible: allergic reactions like skin rash, itching or hives, swelling of the face, lips, or tongue breathing problems changes in heart rhythm high or low blood pressure pain, redness, or irritation at site where injected rectal bleeding serious dizziness or disorientation, confusion sharp or severe stomach pain sharp pain in your leg Side effects that usually do not require medical attention (report to your doctor or health care professional if they continue or are bothersome): constipation or diarrhea hair loss headache hiccups loss of appetite nausea upset stomach tiredness This list may not describe all possible side effects. Call your doctor for medical advice about side effects. You may report side effects to FDA at 1-800-FDA-1088. Where should I keep my medication? This drug is given in a hospital or clinic and will not be stored at home. NOTE: This sheet is a summary. It may not cover all possible information. If you have questions about this medicine, talk to your doctor, pharmacist, or health care provider.  2022 Elsevier/Gold Standard (2016-08-01  12:55:48)    Palonosetron Injection What is this medication? PALONOSETRON (pal oh NOE se tron) is used to prevent nausea and vomiting caused by chemotherapy. It also helps prevent delayed nausea and vomiting that may occur a few days after your treatment. This medicine may be used for other purposes; ask your health care provider or pharmacist if you have questions. COMMON BRAND NAME(S): Aloxi What should I tell my care team before I take this medication? They need to know if you have any of these conditions: an unusual or allergic reaction to palonosetron, dolasetron, granisetron, ondansetron, other medicines, foods, dyes, or preservatives pregnant or trying to get pregnant breast-feeding How should I use this medication? This medicine is for infusion into a vein. It is given by a health care professional in a hospital or clinic setting. Talk to your pediatrician regarding the use of this medicine in children. While this drug may be prescribed for children as young as 1 month for selected conditions, precautions do apply. Overdosage: If you think you have taken too much of this medicine contact a poison control center or emergency room at once. NOTE: This medicine is only for  you. Do not share this medicine with others. What if I miss a dose? This does not apply. What may interact with this medication? certain medicines for depression, anxiety, or psychotic disturbances fentanyl linezolid MAOIs like Carbex, Eldepryl, Marplan, Nardil, and Parnate methylene blue (injected into a vein) tramadol This list may not describe all possible interactions. Give your health care provider a list of all the medicines, herbs, non-prescription drugs, or dietary supplements you use. Also tell them if you smoke, drink alcohol, or use illegal drugs. Some items may interact with your medicine. What should I watch for while using this medication? Your condition will be monitored carefully while you are  receiving this medicine. What side effects may I notice from receiving this medication? Side effects that you should report to your doctor or health care professional as soon as possible: allergic reactions like skin rash, itching or hives, swelling of the face, lips, or tongue breathing problems confusion dizziness fast, irregular heartbeat fever and chills loss of balance or coordination seizures sweating swelling of the hands and feet tremors unusually weak or tired Side effects that usually do not require medical attention (report to your doctor or health care professional if they continue or are bothersome): constipation or diarrhea headache This list may not describe all possible side effects. Call your doctor for medical advice about side effects. You may report side effects to FDA at 1-800-FDA-1088. Where should I keep my medication? This drug is given in a hospital or clinic and will not be stored at home. NOTE: This sheet is a summary. It may not cover all possible information. If you have questions about this medicine, talk to your doctor, pharmacist, or health care provider.  2022 Elsevier/Gold Standard (2013-02-19 10:38:36)

## 2021-01-03 NOTE — Progress Notes (Signed)
DISCONTINUE OFF PATHWAY REGIMEN - Other   [id number]:Cisplatin 40 mg/m2 IV D1 q7 Days + RT:   A cycle is every 7 days:     Cisplatin   **Always confirm dose/schedule in your pharmacy ordering system**  REASON: Toxicities / Adverse Event PRIOR TREATMENT: Cisplatin 40 mg/m2 IV D1 q7 Days + RT TREATMENT RESPONSE: Unable to Evaluate  START OFF PATHWAY REGIMEN - Other   [id number]:Carboplatin AUC=1.5 Weekly + RT:   Administer weekly:     Carboplatin   **Always confirm dose/schedule in your pharmacy ordering system**  Patient Characteristics: Intent of Therapy: Curative Intent, Discussed with Patient

## 2021-01-04 ENCOUNTER — Telehealth: Payer: Self-pay

## 2021-01-04 ENCOUNTER — Ambulatory Visit
Admission: RE | Admit: 2021-01-04 | Discharge: 2021-01-04 | Disposition: A | Discharge status: Home / Self Care | Payer: Medicare Other | Source: Ambulatory Visit | Attending: Radiation Oncology | Admitting: Radiation Oncology

## 2021-01-04 DIAGNOSIS — C539 Malignant neoplasm of cervix uteri, unspecified: Secondary | ICD-10-CM | POA: Diagnosis not present

## 2021-01-04 NOTE — Telephone Encounter (Signed)
Spoke to pt's daughter Baker Janus and relayed message regarding B12 inj and switching tx, she will let Mrs. Preet know.   Please schedule patient for B12 inj daily for 5 days. Please coordinate with radiation times.   Transportation form has been signed and patient has been put on van pick up for tx on 9/14. Please arrange patient for Lucianne Lei transportation for radiation treatments as well. She understands that it will not be possible to get on the [facility] pickup today, please call Baker Janus with update regarding transportation for the following txs.   Please switch next appointment to lab/MD/ Carboplating *new* instead of cisplatin. Thanks

## 2021-01-04 NOTE — Telephone Encounter (Signed)
Ana/ Tamika, can you guys arrange [facility] transportation for pt's radiation appts please. Baker Janus, daughter, would like a call to confirm transportation and when they will start picking up pt.

## 2021-01-04 NOTE — Telephone Encounter (Signed)
Yes, consent is signed and sent to scan in chart. Yes, that will work. Thanks

## 2021-01-04 NOTE — Telephone Encounter (Signed)
Telephone call to patient for follow up after receiving first infusion.   Patient states infusion went great.  States eating good and drinking plenty of fluids.   Denies any nausea or vomiting.  Encouraged patient to call for any concerns or questions. 

## 2021-01-04 NOTE — Telephone Encounter (Signed)
-----   Message from Earlie Server, MD sent at 01/03/2021  4:50 PM EDT ----- Please let her know that vitamin B12 level is low.  Please arrange her to get vitamin B12 daily x5. Also most likely her kidney function will not improve.  Please switch her next appointment to lab MD carboplatin. [Instead of cisplatin].  Thanks

## 2021-01-05 ENCOUNTER — Ambulatory Visit
Admission: RE | Admit: 2021-01-05 | Discharge: 2021-01-05 | Disposition: A | Discharge status: Home / Self Care | Payer: Medicare Other | Source: Ambulatory Visit | Attending: Radiation Oncology | Admitting: Radiation Oncology

## 2021-01-05 DIAGNOSIS — C539 Malignant neoplasm of cervix uteri, unspecified: Secondary | ICD-10-CM | POA: Diagnosis not present

## 2021-01-08 ENCOUNTER — Ambulatory Visit: Payer: Medicare Other

## 2021-01-08 ENCOUNTER — Inpatient Hospital Stay: Payer: Medicare Other

## 2021-01-08 ENCOUNTER — Other Ambulatory Visit: Payer: Self-pay

## 2021-01-08 ENCOUNTER — Ambulatory Visit
Admission: RE | Admit: 2021-01-08 | Discharge: 2021-01-08 | Disposition: A | Discharge status: Home / Self Care | Payer: Medicare Other | Source: Ambulatory Visit | Attending: Radiation Oncology | Admitting: Radiation Oncology

## 2021-01-08 DIAGNOSIS — Z5111 Encounter for antineoplastic chemotherapy: Secondary | ICD-10-CM | POA: Diagnosis not present

## 2021-01-08 DIAGNOSIS — C539 Malignant neoplasm of cervix uteri, unspecified: Secondary | ICD-10-CM | POA: Diagnosis not present

## 2021-01-08 DIAGNOSIS — D518 Other vitamin B12 deficiency anemias: Secondary | ICD-10-CM

## 2021-01-08 MED ORDER — CYANOCOBALAMIN 1000 MCG/ML IJ SOLN
1000.0000 ug | Freq: Once | INTRAMUSCULAR | Status: AC
  Administered 2021-01-08: 1000 ug via INTRAMUSCULAR
  Filled 2021-01-08: qty 1

## 2021-01-09 ENCOUNTER — Inpatient Hospital Stay: Payer: Medicare Other

## 2021-01-09 ENCOUNTER — Ambulatory Visit
Admission: RE | Admit: 2021-01-09 | Discharge: 2021-01-09 | Disposition: A | Discharge status: Home / Self Care | Payer: Medicare Other | Source: Ambulatory Visit | Attending: Radiation Oncology | Admitting: Radiation Oncology

## 2021-01-09 ENCOUNTER — Encounter (INDEPENDENT_AMBULATORY_CARE_PROVIDER_SITE_OTHER): Payer: Self-pay

## 2021-01-09 ENCOUNTER — Telehealth (INDEPENDENT_AMBULATORY_CARE_PROVIDER_SITE_OTHER): Payer: Self-pay

## 2021-01-09 DIAGNOSIS — D518 Other vitamin B12 deficiency anemias: Secondary | ICD-10-CM

## 2021-01-09 DIAGNOSIS — Z5111 Encounter for antineoplastic chemotherapy: Secondary | ICD-10-CM | POA: Diagnosis not present

## 2021-01-09 DIAGNOSIS — C539 Malignant neoplasm of cervix uteri, unspecified: Secondary | ICD-10-CM | POA: Diagnosis not present

## 2021-01-09 MED ORDER — CYANOCOBALAMIN 1000 MCG/ML IJ SOLN
1000.0000 ug | Freq: Once | INTRAMUSCULAR | Status: AC
  Administered 2021-01-09: 1000 ug via INTRAMUSCULAR
  Filled 2021-01-09: qty 1

## 2021-01-09 NOTE — Telephone Encounter (Signed)
Patient is schedule for port placement on 01/16/21 with Dr Delana Meyer arrival time 11 am at the [facility]. Patient and daughter has been aware with date and time. Pre-procedure instructions will be mailed out.

## 2021-01-10 ENCOUNTER — Inpatient Hospital Stay: Payer: Medicare Other

## 2021-01-10 ENCOUNTER — Ambulatory Visit
Admission: RE | Admit: 2021-01-10 | Discharge: 2021-01-10 | Disposition: A | Discharge status: Home / Self Care | Payer: Medicare Other | Source: Ambulatory Visit | Attending: Radiation Oncology | Admitting: Radiation Oncology

## 2021-01-10 ENCOUNTER — Telehealth: Payer: Self-pay

## 2021-01-10 ENCOUNTER — Encounter: Payer: Self-pay | Admitting: Oncology

## 2021-01-10 ENCOUNTER — Inpatient Hospital Stay (HOSPITAL_BASED_OUTPATIENT_CLINIC_OR_DEPARTMENT_OTHER): Payer: Medicare Other | Admitting: Oncology

## 2021-01-10 ENCOUNTER — Other Ambulatory Visit: Payer: Self-pay

## 2021-01-10 ENCOUNTER — Other Ambulatory Visit: Payer: Medicare Other

## 2021-01-10 VITALS — BP 137/99 | HR 74 | Temp 98.3°F | Resp 18 | Wt 146.7 lb

## 2021-01-10 DIAGNOSIS — C539 Malignant neoplasm of cervix uteri, unspecified: Secondary | ICD-10-CM

## 2021-01-10 DIAGNOSIS — Z5111 Encounter for antineoplastic chemotherapy: Secondary | ICD-10-CM | POA: Diagnosis not present

## 2021-01-10 DIAGNOSIS — D518 Other vitamin B12 deficiency anemias: Secondary | ICD-10-CM

## 2021-01-10 DIAGNOSIS — R948 Abnormal results of function studies of other organs and systems: Secondary | ICD-10-CM | POA: Diagnosis not present

## 2021-01-10 DIAGNOSIS — N1832 Chronic kidney disease, stage 3b: Secondary | ICD-10-CM

## 2021-01-10 LAB — CBC WITH DIFFERENTIAL/PLATELET
Abs Immature Granulocytes: 0.03 10*3/uL (ref 0.00–0.07)
Basophils Absolute: 0 10*3/uL (ref 0.0–0.1)
Basophils Relative: 0 %
Eosinophils Absolute: 0.4 10*3/uL (ref 0.0–0.5)
Eosinophils Relative: 6 %
HCT: 29.2 % — ABNORMAL LOW (ref 36.0–46.0)
Hemoglobin: 9.4 g/dL — ABNORMAL LOW (ref 12.0–15.0)
Immature Granulocytes: 0 %
Lymphocytes Relative: 17 %
Lymphs Abs: 1.1 10*3/uL (ref 0.7–4.0)
MCH: 34.9 pg — ABNORMAL HIGH (ref 26.0–34.0)
MCHC: 32.2 g/dL (ref 30.0–36.0)
MCV: 108.6 fL — ABNORMAL HIGH (ref 80.0–100.0)
Monocytes Absolute: 0.5 10*3/uL (ref 0.1–1.0)
Monocytes Relative: 8 %
Neutro Abs: 4.6 10*3/uL (ref 1.7–7.7)
Neutrophils Relative %: 69 %
Platelets: 321 10*3/uL (ref 150–400)
RBC: 2.69 MIL/uL — ABNORMAL LOW (ref 3.87–5.11)
RDW: 17.4 % — ABNORMAL HIGH (ref 11.5–15.5)
WBC: 6.7 10*3/uL (ref 4.0–10.5)
nRBC: 0 % (ref 0.0–0.2)

## 2021-01-10 LAB — COMPREHENSIVE METABOLIC PANEL
ALT: 14 U/L (ref 0–44)
AST: 22 U/L (ref 15–41)
Albumin: 2.8 g/dL — ABNORMAL LOW (ref 3.5–5.0)
Alkaline Phosphatase: 56 U/L (ref 38–126)
Anion gap: 5 (ref 5–15)
BUN: 9 mg/dL (ref 8–23)
CO2: 27 mmol/L (ref 22–32)
Calcium: 8.4 mg/dL — ABNORMAL LOW (ref 8.9–10.3)
Chloride: 109 mmol/L (ref 98–111)
Creatinine, Ser: 0.91 mg/dL (ref 0.44–1.00)
GFR, Estimated: >60 mL/min (ref >60)
Glucose, Bld: 85 mg/dL (ref 70–99)
Potassium: 4.2 mmol/L (ref 3.5–5.1)
Sodium: 141 mmol/L (ref 135–145)
Total Bilirubin: 0.7 mg/dL (ref 0.3–1.2)
Total Protein: 6.5 g/dL (ref 6.5–8.1)

## 2021-01-10 MED ORDER — SODIUM CHLORIDE 0.9 % IV SOLN
Freq: Once | INTRAVENOUS | Status: AC
  Filled 2021-01-10: qty 250

## 2021-01-10 MED ORDER — PALONOSETRON HCL INJECTION 0.25 MG/5ML
0.2500 mg | Freq: Once | INTRAVENOUS | Status: AC
  Administered 2021-01-10: 0.25 mg via INTRAVENOUS
  Filled 2021-01-10: qty 5

## 2021-01-10 MED ORDER — SODIUM CHLORIDE 0.9 % IV SOLN
149.8000 mg | Freq: Once | INTRAVENOUS | Status: AC
  Administered 2021-01-10: 150 mg via INTRAVENOUS
  Filled 2021-01-10: qty 15

## 2021-01-10 MED ORDER — CYANOCOBALAMIN 1000 MCG/ML IJ SOLN
1000.0000 ug | Freq: Once | INTRAMUSCULAR | Status: AC
  Administered 2021-01-10: 1000 ug via INTRAMUSCULAR
  Filled 2021-01-10: qty 1

## 2021-01-10 MED ORDER — SODIUM CHLORIDE 0.9 % IV SOLN
10.0000 mg | Freq: Once | INTRAVENOUS | Status: AC
  Administered 2021-01-10: 10 mg via INTRAVENOUS
  Filled 2021-01-10: qty 10

## 2021-01-10 NOTE — Telephone Encounter (Signed)
[facility] has been unsuccessful in reaching Rebecca Parker to arrange consult. I have contacted ENT and they will attempt to reach out to sister Rebecca Parker at [phone].

## 2021-01-10 NOTE — Patient Instructions (Addendum)
[facility]  Discharge Instructions: Thank you for choosing [facility] to provide your oncology and hematology care.  If you have a lab appointment with the [facility], please go directly to the [facility] and check in at the registration area.  Wear comfortable clothing and clothing appropriate for easy access to any Portacath or PICC line.   We strive to give you quality time with your provider. You may need to reschedule your appointment if you arrive late (15 or more minutes).  Arriving late affects you and other patients whose appointments are after yours.  Also, if you miss three or more appointments without notifying the office, you may be dismissed from the clinic at the provider's discretion.      For prescription refill requests, have your pharmacy contact our office and allow 72 hours for refills to be completed.    Today you received the following chemotherapy and/or immunotherapy agents: Carboplatin      To help prevent nausea and vomiting after your treatment, we encourage you to take your nausea medication as directed.  BELOW ARE SYMPTOMS THAT SHOULD BE REPORTED IMMEDIATELY: *FEVER GREATER THAN 100.4 F (38 C) OR HIGHER *CHILLS OR SWEATING *NAUSEA AND VOMITING THAT IS NOT CONTROLLED WITH YOUR NAUSEA MEDICATION *UNUSUAL SHORTNESS OF BREATH *UNUSUAL BRUISING OR BLEEDING *URINARY PROBLEMS (pain or burning when urinating, or frequent urination) *BOWEL PROBLEMS (unusual diarrhea, constipation, pain near the anus) TENDERNESS IN MOUTH AND THROAT WITH OR WITHOUT PRESENCE OF ULCERS (sore throat, sores in mouth, or a toothache) UNUSUAL RASH, SWELLING OR PAIN  UNUSUAL VAGINAL DISCHARGE OR ITCHING   Items with * indicate a potential emergency and should be followed up as soon as possible or go to the Emergency Department if any problems should occur.  Please show the CHEMOTHERAPY ALERT CARD or IMMUNOTHERAPY ALERT CARD at  check-in to the Emergency Department and triage nurse.  Should you have questions after your visit or need to cancel or reschedule your appointment, please contact [facility]  [phone] and follow the prompts.  Office hours are 8:00 a.m. to 4:30 p.m. Monday - Friday. Please note that voicemails left after 4:00 p.m. may not be returned until the following business day.  We are closed weekends and major holidays. You have access to a nurse at all times for urgent questions. Please call the main number to the clinic [phone] and follow the prompts.  For any non-urgent questions, you may also contact your provider using MyChart. We now offer e-Visits for anyone 56 and older to request care online for non-urgent symptoms. For details visit [email or web address].   Also download the MyChart app! Go to the app store, search "MyChart", open the app, select [facility], and log in with your MyChart username and password.  Due to Covid, a mask is required upon entering the hospital/clinic. If you do not have a mask, one will be given to you upon arrival. For doctor visits, patients may have 1 support person aged 17 or older with them. For treatment visits, patients cannot have anyone with them due to current Covid guidelines and our immunocompromised population. Carboplatin injection What is this medication? CARBOPLATIN (KAR boe pla tin) is a chemotherapy drug. It targets fast dividing cells, like cancer cells, and causes these cells to die. This medicine is used to treat ovarian cancer and many other cancers. This medicine may be used for other purposes; ask your health care provider or pharmacist if  you have questions. COMMON BRAND NAME(S): Paraplatin What should I tell my care team before I take this medication? They need to know if you have any of these conditions: blood disorders hearing problems kidney disease recent or ongoing radiation therapy an unusual  or allergic reaction to carboplatin, cisplatin, other chemotherapy, other medicines, foods, dyes, or preservatives pregnant or trying to get pregnant breast-feeding How should I use this medication? This drug is usually given as an infusion into a vein. It is administered in a hospital or clinic by a specially trained health care professional. Talk to your pediatrician regarding the use of this medicine in children. Special care may be needed. Overdosage: If you think you have taken too much of this medicine contact a poison control center or emergency room at once. NOTE: This medicine is only for you. Do not share this medicine with others. What if I miss a dose? It is important not to miss a dose. Call your doctor or health care professional if you are unable to keep an appointment. What may interact with this medication? medicines for seizures medicines to increase blood counts like filgrastim, pegfilgrastim, sargramostim some antibiotics like amikacin, gentamicin, neomycin, streptomycin, tobramycin vaccines Talk to your doctor or health care professional before taking any of these medicines: acetaminophen aspirin ibuprofen ketoprofen naproxen This list may not describe all possible interactions. Give your health care provider a list of all the medicines, herbs, non-prescription drugs, or dietary supplements you use. Also tell them if you smoke, drink alcohol, or use illegal drugs. Some items may interact with your medicine. What should I watch for while using this medication? Your condition will be monitored carefully while you are receiving this medicine. You will need important blood work done while you are taking this medicine. This drug may make you feel generally unwell. This is not uncommon, as chemotherapy can affect healthy cells as well as cancer cells. Report any side effects. Continue your course of treatment even though you feel ill unless your doctor tells you to stop. In  some cases, you may be given additional medicines to help with side effects. Follow all directions for their use. Call your doctor or health care professional for advice if you get a fever, chills or sore throat, or other symptoms of a cold or flu. Do not treat yourself. This drug decreases your body's ability to fight infections. Try to avoid being around people who are sick. This medicine may increase your risk to bruise or bleed. Call your doctor or health care professional if you notice any unusual bleeding. Be careful brushing and flossing your teeth or using a toothpick because you may get an infection or bleed more easily. If you have any dental work done, tell your dentist you are receiving this medicine. Avoid taking products that contain aspirin, acetaminophen, ibuprofen, naproxen, or ketoprofen unless instructed by your doctor. These medicines may hide a fever. Do not become pregnant while taking this medicine. Women should inform their doctor if they wish to become pregnant or think they might be pregnant. There is a potential for serious side effects to an unborn child. Talk to your health care professional or pharmacist for more information. Do not breast-feed an infant while taking this medicine. What side effects may I notice from receiving this medication? Side effects that you should report to your doctor or health care professional as soon as possible: allergic reactions like skin rash, itching or hives, swelling of the face, lips, or tongue signs of  infection - fever or chills, cough, sore throat, pain or difficulty passing urine signs of decreased platelets or bleeding - bruising, pinpoint red spots on the skin, black, tarry stools, nosebleeds signs of decreased red blood cells - unusually weak or tired, fainting spells, lightheadedness breathing problems changes in hearing changes in vision chest pain high blood pressure low blood counts - This drug may decrease the number of  white blood cells, red blood cells and platelets. You may be at increased risk for infections and bleeding. nausea and vomiting pain, swelling, redness or irritation at the injection site pain, tingling, numbness in the hands or feet problems with balance, talking, walking trouble passing urine or change in the amount of urine Side effects that usually do not require medical attention (report to your doctor or health care professional if they continue or are bothersome): hair loss loss of appetite metallic taste in the mouth or changes in taste This list may not describe all possible side effects. Call your doctor for medical advice about side effects. You may report side effects to FDA at 1-800-FDA-1088. Where should I keep my medication? This drug is given in a hospital or clinic and will not be stored at home. NOTE: This sheet is a summary. It may not cover all possible information. If you have questions about this medicine, talk to your doctor, pharmacist, or health care provider.  2022 Elsevier/Gold Standard (2007-07-21 14:38:05) Vitamin B12 Injection What is this medication? Vitamin B12 (VAHY tuh min B12) prevents and treats low vitamin B12 levels in your body. It is used in people who do not get enough vitamin B12 from their diet or when their digestive tract does not absorb enough. Vitamin B12 plays an important role in maintaining the health of your nervous system and red blood cells. This medicine may be used for other purposes; ask your health care provider or pharmacist if you have questions. COMMON BRAND NAME(S): B-12 Compliance Kit, B-12 Injection Kit, Cyomin, Dodex, LA-12, Nutri-Twelve, Physicians EZ Use B-12, Primabalt What should I tell my care team before I take this medication? They need to know if you have any of these conditions: Kidney disease Leber's disease Megaloblastic anemia An unusual or allergic reaction to cyanocobalamin, cobalt, other medications, foods, dyes,  or preservatives Pregnant or trying to get pregnant Breast-feeding How should I use this medication? This medication is injected into a muscle or deeply under the skin. It is usually given in a clinic or care team's office. However, your care team may teach you how to inject yourself. Follow all instructions. Talk to your care team about the use of this medication in children. Special care may be needed. Overdosage: If you think you have taken too much of this medicine contact a poison control center or emergency room at once. NOTE: This medicine is only for you. Do not share this medicine with others. What if I miss a dose? If you are given your dose at a clinic or care team's office, call to reschedule your appointment. If you give your own injections, and you miss a dose, take it as soon as you can. If it is almost time for your next dose, take only that dose. Do not take double or extra doses. What may interact with this medication? Colchicine Heavy alcohol intake This list may not describe all possible interactions. Give your health care provider a list of all the medicines, herbs, non-prescription drugs, or dietary supplements you use. Also tell them if you smoke, drink  alcohol, or use illegal drugs. Some items may interact with your medicine. What should I watch for while using this medication? Visit your care team regularly. You may need blood work done while you are taking this medication. You may need to follow a special diet. Talk to your care team. Limit your alcohol intake and avoid smoking to get the best benefit. What side effects may I notice from receiving this medication? Side effects that you should report to your care team as soon as possible: Allergic reactions-skin rash, itching, hives, swelling of the face, lips, tongue, or throat Swelling of the ankles, hands, or feet Trouble breathing Side effects that usually do not require medical attention (report to your care team  if they continue or are bothersome): Diarrhea This list may not describe all possible side effects. Call your doctor for medical advice about side effects. You may report side effects to FDA at 1-800-FDA-1088. Where should I keep my medication? Keep out of the reach of children. Store at room temperature between 15 and 30 degrees C (59 and 85 degrees F). Protect from light. Throw away any unused medication after the expiration date. NOTE: This sheet is a summary. It may not cover all possible information. If you have questions about this medicine, talk to your doctor, pharmacist, or health care provider.  2022 Elsevier/Gold Standard (2020-06-05 11:47:06)

## 2021-01-10 NOTE — Telephone Encounter (Signed)
I discussed imaging results with Mariea Clonts. Patient aware of results. Plan to obtain ENT consultation for thyroid and HEENT evaluation. Order has been submitted.  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 70 year old female.   EXAM: NUCLEAR MEDICINE PET SKULL BASE TO THIGH   TECHNIQUE: 8.1 mCi F-18 FDG was injected intravenously. Full-ring PET imaging was performed from the skull base to thigh after the radiotracer. CT data was obtained and used for attenuation correction and anatomic localization.   Fasting blood glucose: 76 mg/dl   COMPARISON:  CT abdomen and pelvis of August of 2022.   FINDINGS: Mediastinal blood pool activity: SUV max 2.1   Liver activity: SUV max NA   NECK: Asymmetric fullness of RIGHT glossotonsillar sulcus and activity associated with this area on FDG PET maximum SUV 7.6 (image 38/3) contralateral activity 5.0 no discrete adenopathy in the neck. Asymmetric thyroid activity with nodule in the RIGHT thyroid showing maximum SUV of 5.9 (image 58/3) nodule measuring up to 2.8 cm.   Incidental CT findings: none   CHEST: RIGHT thyroid abnormality as described. Some nodularity of the LEFT thyroid. No signs of hypermetabolic lymph nodes or suspicious pulmonary nodules in the chest. Lymph nodes with fatty hila in the LEFT axilla maximum SUV of 2.5   Incidental CT findings: Aortic atherosclerosis. Three-vessel coronary artery disease. No consolidation or evidence of pleural effusion.   ABDOMEN/PELVIS: Diffuse activity throughout the uterus and cervix with a maximum SUV of 16.3. (Image 221/3) cervix is bulky measuring 6.3 x 4.0 cm at this level. Activity extends throughout the entire uterus.   Multi cystic RIGHT adnexal lesion shows increased metabolic activity at the lateral margin with a maximum SUV of 6.7 (image 204/3) 6.2 x 4.5 cm.   Small LEFT external iliac  lymph nodes and common iliac lymph nodes are noted, most hypermetabolic with a maximum SUV of 4.1 measuring 7 mm (image 181/3   Incidental CT findings: No acute findings relative to liver, gallbladder, pancreas, spleen, adrenal glands, kidneys, stomach, small or large bowel. No pericecal stranding. Appendix not visualized.   Colonic diverticulosis.   SKELETON: No focal hypermetabolic activity to suggest skeletal metastasis.   Incidental CT findings: Spinal degenerative changes.   IMPRESSION: Signs of cervical cancer with diffuse involvement of the uterus.   Cystic and solid RIGHT ovarian lesion more likely related to diffuse cervical cancer. Concomitant synchronous cystic ovarian neoplasm could have this appearance but is felt less likely given the diffuse nature of disease throughout the uterus and cervix.   Small lymph nodes in the pelvis in the LEFT pelvis suspicious for nodal involvement at the common iliac level.   No solid organ or distant metastasis.   RIGHT lingual tonsil and glossotonsillar sulcus uptake which is asymmetric and with increased fullness. While this may be physiologic would suggest direct visualization for further evaluation to exclude neoplasm in this area in the head and neck.   RIGHT thyroid uptake with visible nodule. Recommend thyroid US and biopsy (ref: J Am Coll Radiol. 2015 Feb;12(2): 143-50).   Aortic Atherosclerosis (ICD10-I70.0).     Electronically Signed   By: Zetta Bills M.D.   On: 12/28/2020 18:06

## 2021-01-10 NOTE — Progress Notes (Signed)
Hematology/Oncology progress note [facility] [phone] [phone]   Patient Care Team: Center, [facility] as PCP - General (General Practice) Clent Jacks, RN as Oncology Nurse Navigator Earlie Server, MD as Consulting Physician (Oncology) Earlie Server, MD as Consulting Physician (Hematology and Oncology)  REFERRING PROVIDER: [facility], [place] VISIT:  Follow up for treatment of cervical cancer  HISTORY OF PRESENTING ILLNESS:   Rebecca Parker is a  70 y.o.  female with PMH listed below was seen in consultation at the request of  Center, Jenny [facility]*  for evaluation of cervical cancer   12/01/2020-8/9/022 She was admitted from the ED for 1 day h/o right lower abdominal / pelvic pain . 12/01/2020 CT abdomen pelvis w contrast showed  thickly septated right ovarian mass measuring 5.1 x 5.1 cm. 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 Recommend initial pelvic ultrasound and likely subsequent MRI to further evaluate, which may be performed on a nonemergent basis. 2. Air within the fundal endometrial cavity. Correlate for recent instrumentation. 3. Pancolonic diverticulosis without evidence of acute diverticulitis.  12/01/2020 Fx D+C  and cervical bx. Uterus sounded to 9 cm.   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   Diastolic CHF and Diabetes.  UDS + for cocaine.   Today she is accompanied by her sister.  She is the oldest of 7 kids (5 girls and 2 boys). She has 3 children - all SVDs - 2 boys and one girl.  She was seen by GynOnc Dr.Secord.  Her pelvic examination showed On palpations  suspect lateral vaginal disease >50% down the vault and disease involving the left fornix. Cervix enlarged >4 cm with grossly obvious tumor and hard to palpation. Uterus - may be enlarged with firm mass on the right aspect versus palpation of the right adnexal mass. Positive parametrial involvement on the right and on the left with disease to or almost to the sidewall on the left. Rectovaginal exam was confirmatory.    # 8/31 2022, PET scan showed signs of cervical cancer with diffuse involvement of the uterus.  Cystic and solid right ovarian lesion more likely related to diffuse cervical cancer.  Concomitant synchronous cystic ovarian neoplasm could have appearance but feel less likely given the diffuse nature of disease throughout the uterus and cervix.  Small lymph nodes in the pelvis in the left pelvis suspicious for nodal involvement at the common iliac level.  No solid organ distant metastasis. Right lung renal consult and glossotonsillar sulcus uptake which is asymmetric and with increased fullness.  While this may be physiologic, will suggest direct visualization for further evaluation to exclude neoplasm. Right thyroid uptake with visible nodule.  Recommend ultrasound thyroid and biopsy.  INTERVAL HISTORY Rebecca Parker is a 70 y.o. female who has above history reviewed by me today presents for follow up visit for treatment of cervix cancer. Problems and complaints are listed below: S/p cycle 1 dose reduced cisplatin '20mg'$ /m2, concurrent RT She tolerates well.  No fever, chills, nausea, vomiting.  She is a poor historian. She is here by herself. I Called her daughter Baker Janus.   Review of Systems  Constitutional:  Positive for fatigue. Negative  for appetite change, chills and fever.  HENT:   Negative for hearing loss and voice change.   Eyes:  Negative for eye problems.  Respiratory:  Negative for chest tightness and cough.   Cardiovascular:  Negative for chest pain.  Gastrointestinal:   Negative for abdominal distention, abdominal pain and blood in stool.  Endocrine: Negative for hot flashes.  Genitourinary:  Negative for difficulty urinating and frequency.   Musculoskeletal:  Negative for arthralgias.  Skin:  Negative for itching and rash.  Neurological:  Negative for extremity weakness.  Hematological:  Negative for adenopathy.  Psychiatric/Behavioral:  Negative for confusion.    MEDICAL HISTORY:  Past Medical History:  Diagnosis Date   B12 deficiency anemia 01/03/2021   Chest pain, unspecified    CHF (congestive heart failure) ([place])    Diabetes mellitus without complication (HCC)    Hip pain    Hypertension    Multinodular goiter     SURGICAL HISTORY: Past Surgical History:  Procedure Laterality Date   COLONOSCOPY WITH PROPOFOL N/A 11/24/2015   Procedure: COLONOSCOPY WITH PROPOFOL;  Surgeon: Lollie Sails, MD;  Location: [facility] ENDOSCOPY;  Service: Endoscopy;  Laterality: N/A;   COLONOSCOPY WITH PROPOFOL N/A 03/18/2019   Procedure: COLONOSCOPY WITH PROPOFOL;  Surgeon: Virgel Manifold, MD;  Location: [facility] ENDOSCOPY;  Service: Endoscopy;  Laterality: N/A;   DILATION AND CURETTAGE OF UTERUS N/A 12/01/2020   Procedure: DILATATION AND CURETTAGE with cervical biopsies;  Surgeon: Schermerhorn, Gwen Her, MD;  Location: [facility] ORS;  Service: Gynecology;  Laterality: N/A;   ESOPHAGOGASTRODUODENOSCOPY (EGD) WITH PROPOFOL N/A 03/18/2019   Procedure: ESOPHAGOGASTRODUODENOSCOPY (EGD) WITH PROPOFOL;  Surgeon: Virgel Manifold, MD;  Location: [facility] ENDOSCOPY;  Service: Endoscopy;  Laterality: N/A;   right ankle orif      SOCIAL HISTORY: Social History   Socioeconomic History   Marital status: Legally Separated    Spouse name: Not on file   Number of children: Not on file   Years of education: Not on file   Highest education level: Not on file  Occupational History   Not on file  Tobacco Use   Smoking status: Every Day    Packs/day: 0.10    Types: Cigarettes    Smokeless tobacco: Never  Substance and Sexual Activity   Alcohol use: Not Currently   Drug use: Not on file   Sexual activity: Not Currently  Other Topics Concern   Not on file  Social History Narrative   Not on file   Social Determinants of Health   Financial Resource Strain: Not on file  Food Insecurity: Not on file  Transportation Needs: Not on file  Physical Activity: Not on file  Stress: Not on file  Social Connections: Not on file  Intimate Partner Violence: Not on file    FAMILY HISTORY: Family History  Problem Relation Age of Onset   Breast cancer Maternal Aunt    Breast cancer Paternal Aunt     ALLERGIES:  is allergic to ace inhibitors and cyclobenzaprine.  MEDICATIONS:  Current Outpatient Medications  Medication Sig Dispense Refill   aspirin EC 81 MG tablet Take 81 mg by mouth daily.     Calcium Carbonate-Vitamin D 500-125 MG-UNIT TABS Take by mouth.     docusate sodium (COLACE) 100 MG capsule Take 100 mg by mouth daily.     Ferrous Sulfate (IRON) 325 (65 Fe) MG TABS Take 1 tablet by mouth 2 (two) times daily.     furosemide (LASIX) 20 MG tablet Take 20 mg  by mouth 2 (two) times daily.     gabapentin (NEURONTIN) 800 MG tablet Take 800 mg by mouth 2 (two) times daily.     hydrOXYzine (ATARAX/VISTARIL) 25 MG tablet Take 25 mg by mouth daily. Take '25mg'$ . By mouth every morning     lidocaine-prilocaine (EMLA) cream Apply to affected area once 30 g 3   losartan (COZAAR) 50 MG tablet Take 50 mg by mouth 2 (two) times daily.     metFORMIN (GLUCOPHAGE) 500 MG tablet Take 500 mg by mouth 2 (two) times daily with a meal.     mirtazapine (REMERON) 15 MG tablet Take 15 mg by mouth at bedtime.     ondansetron (ZOFRAN) 4 MG tablet Take 1 tablet (4 mg total) by mouth every 6 (six) hours as needed for nausea. 20 tablet 0   simvastatin (ZOCOR) 20 MG tablet Take 20 mg by mouth daily.     oxyCODONE (OXY IR/ROXICODONE) 5 MG immediate release tablet Take 1 tablet (5 mg total) by  mouth every 8 (eight) hours as needed for moderate pain. (Patient not taking: Reported on 01/10/2021) 15 tablet 0   No current facility-administered medications for this visit.     PHYSICAL EXAMINATION: ECOG PERFORMANCE STATUS: 2 - Symptomatic, <50% confined to bed Vitals:   01/10/21 0857  BP: (!) 137/99  Pulse: 74  Resp: 18  Temp: 98.3 F (36.8 C)   Filed Weights   01/10/21 0857  Weight: 146 lb 11.2 oz (66.5 kg)    Physical Exam Constitutional:      General: She is not in acute distress.    Comments: Walks with a walker  HENT:     Head: Normocephalic and atraumatic.  Eyes:     General: No scleral icterus. Cardiovascular:     Rate and Rhythm: Normal rate and regular rhythm.     Heart sounds: Normal heart sounds.  Pulmonary:     Effort: Pulmonary effort is normal. No respiratory distress.     Breath sounds: No wheezing.  Abdominal:     General: Bowel sounds are normal. There is no distension.     Palpations: Abdomen is soft.  Musculoskeletal:        General: No deformity. Normal range of motion.     Cervical back: Normal range of motion and neck supple.  Skin:    General: Skin is warm and dry.     Findings: No erythema or rash.  Neurological:     Mental Status: She is alert and oriented to person, place, and time. Mental status is at baseline.     Cranial Nerves: No cranial nerve deficit.     Coordination: Coordination normal.  Psychiatric:        Mood and Affect: Mood normal.    LABORATORY DATA:  I have reviewed the data as listed Lab Results  Component Value Date   WBC 6.7 01/10/2021   HGB 9.4 (L) 01/10/2021   HCT 29.2 (L) 01/10/2021   MCV 108.6 (H) 01/10/2021   PLT 321 01/10/2021   Recent Labs    12/26/20 0846 01/03/21 0807 01/10/21 0810  NA 138 137 141  K 3.8 3.9 4.2  CL 106 105 109  CO2 '24 26 27  '$ GLUCOSE 86 82 85  BUN '11 11 9  '$ CREATININE 1.20* 1.33* 0.91  CALCIUM 8.5* 8.6* 8.4*  GFRNONAA 48* 43* >60  PROT 7.1 7.6 6.5  ALBUMIN 2.7* 2.9*  2.8*  AST '17 19 22  '$ ALT '12 12 14  '$ ALKPHOS 57  64 56  BILITOT 0.4 0.1* 0.7    Iron/TIBC/Ferritin/ %Sat    Component Value Date/Time   IRON 85 12/26/2020 0846   IRON 90 02/24/2019 1550   TIBC 204 (L) 12/26/2020 0846   TIBC 292 02/24/2019 1550   FERRITIN 25 12/26/2020 0846   FERRITIN 33 02/24/2019 1550   IRONPCTSAT 42 (H) 12/26/2020 0846   IRONPCTSAT 31 02/24/2019 1550       RADIOGRAPHIC STUDIES: I have personally reviewed the radiological images as listed and agreed with the findings in the report. 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 70 year old female. EXAM: NUCLEAR MEDICINE PET SKULL BASE TO THIGH TECHNIQUE: 8.1 mCi F-18 FDG was injected intravenously. Full-ring PET imaging was performed from the skull base to thigh after the radiotracer. CT data was obtained and used for attenuation correction and anatomic localization. Fasting blood glucose: 76 mg/dl COMPARISON:  CT abdomen and pelvis of August of 2022. FINDINGS: Mediastinal blood pool activity: SUV max 2.1 Liver activity: SUV max NA NECK: Asymmetric fullness of RIGHT glossotonsillar sulcus and activity associated with this area on FDG PET maximum SUV 7.6 (image 38/3) contralateral activity 5.0 no discrete adenopathy in the neck. Asymmetric thyroid activity with nodule in the RIGHT thyroid showing maximum SUV of 5.9 (image 58/3) nodule measuring up to 2.8 cm. Incidental CT findings: none CHEST: RIGHT thyroid abnormality as described. Some nodularity of the LEFT thyroid. No signs of hypermetabolic lymph nodes or suspicious pulmonary nodules in the chest. Lymph nodes with fatty hila in the LEFT axilla maximum SUV of 2.5 Incidental CT findings: Aortic atherosclerosis. Three-vessel coronary artery disease. No consolidation or evidence of pleural effusion. ABDOMEN/PELVIS: Diffuse activity throughout the uterus and  cervix with a maximum SUV of 16.3. (Image 221/3) cervix is bulky measuring 6.3 x 4.0 cm at this level. Activity extends throughout the entire uterus. Multi cystic RIGHT adnexal lesion shows increased metabolic activity at the lateral margin with a maximum SUV of 6.7 (image 204/3) 6.2 x 4.5 cm. Small LEFT external iliac lymph nodes and common iliac lymph nodes are noted, most hypermetabolic with a maximum SUV of 4.1 measuring 7 mm (image 181/3 Incidental CT findings: No acute findings relative to liver, gallbladder, pancreas, spleen, adrenal glands, kidneys, stomach, small or large bowel. No pericecal stranding. Appendix not visualized. Colonic diverticulosis. SKELETON: No focal hypermetabolic activity to suggest skeletal metastasis. Incidental CT findings: Spinal degenerative changes. IMPRESSION: Signs of cervical cancer with diffuse involvement of the uterus. Cystic and solid RIGHT ovarian lesion more likely related to diffuse cervical cancer. Concomitant synchronous cystic ovarian neoplasm could have this appearance but is felt less likely given the diffuse nature of disease throughout the uterus and cervix. Small lymph nodes in the pelvis in the LEFT pelvis suspicious for nodal involvement at the common iliac level. No solid organ or distant metastasis. RIGHT lingual tonsil and glossotonsillar sulcus uptake which is asymmetric and with increased fullness. While this may be physiologic would suggest direct visualization for further evaluation to exclude neoplasm in this area in the head and neck. RIGHT thyroid uptake with visible nodule. Recommend thyroid US and biopsy (ref: J Am Coll Radiol. 2015 Feb;12(2): 143-50). Aortic Atherosclerosis (ICD10-I70.0). Electronically Signed   By: Zetta Bills M.D.   On: 12/28/2020 18:06      ASSESSMENT & PLAN:  1. 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[place])   2. Encounter for antineoplastic chemotherapy  3. Stage 3b chronic kidney disease ([place])   4. Abnormal positron emission  tomography (PET) scan    #Locally advanced cervical cancer.  Stage III Not a surgical candidate. Patient has had baseline audiogram done and chemotherapy class. She is starting radiation today. Labs reviewed and discussed with patient. S/p  1 cycle of cisplatin 20 mg/m2 today.  Dose were reduced to 50% due to her kidney function.  Treatment plan was switched to carboplatin given her decreased kidney function Today surprisingly her kidney function has improved. Due to the increased risk authorization and infusion chair time that is booked for her today, will proceed with carboplatin AUC of 2 today. Her kidney function fluctuates a lot. Patient will get a BMP on 01/15/2021, if kidney function remains good, we will switch her back to cisplatin.   Difficulty of obtaining IV access, chemo RN managed to have an IV access after multiple attempts today.  #Mediport placement by vascular surgeon.    #Macrocytic anemia,  Today at 9.4 macrocytic. Vitamin B12 was checked was significantly low at 91.  Patient is getting daily vitamin B12 x5, will arrange weekly B12 x4.  #Abnormal PET scan with tonsil uptake.  Refer to ENT.  Plans were discussed with daughter Baker Janus over the phone.  She agrees with the treatment plan.  Orders Placed This Encounter  Procedures   Basic metabolic panel    Standing Status:   Future    Standing Expiration Date:   01/10/2022    All questions were answered. The patient knows to call the clinic with any problems questions or concerns.  cc Center, [place]    Return of visit: 1 week, lab MD cisplatin or carboplatin   Earlie Server, MD, PhD Hematology Oncology [place] at [facility]  01/10/2021

## 2021-01-10 NOTE — Progress Notes (Signed)
Nutrition Assessment   Reason for Assessment:  RD screen, wt loss   ASSESSMENT:  70 year old female with cervical cancer.  Noted hospital admission 8/5-8/9. Past medical history of CHF, DM, HTN.  Patient receiving chemotherapy and concurrent radiation.  Met with patient during infusion.  Patient vocal about not wanting to be here for treatment.  "I am bored and like to be at home making jewelry or at restaurant eating breakfast."  Says that cancer is not going to beat her and she is not going to die of cancer.  Does not want to change her diet.  Says that she does not want to eat all the time.  Says that her appetite is better previously has been down.  Likes to eat sausage and gravy biscuit and chicken biscuit.  Likes to [employer] on food.  Concerned about "all this fake food."  Talked about all the chemicals in the atmosphere and in this building.    Medications: lasix, remeron, zofran, metformin, colace, Fe sulfate, calcium and vit D   Labs: reviewed   Anthropometrics:   Height: 61.5 inches Weight: 146 lb 11.2 today 8/17 140 lb 14 oz 7/22 180 lb ?? Unsure accuracy ED weight BMI: 27 Patient says weight is all over the place   NUTRITION DIAGNOSIS: to be determined   INTERVENTION:  Explained to patient that RD does not want to "put her on a diet."  Discussed importance of being able to eat and tolerate foods that she likes during treatment.   Encouraged good sources of protein and examples of those foods discussed Active listening provided.     MONITORING, EVALUATION, GOAL: weight trends, intake   Next Visit: as needed  Mackenzye Mackel B. Zenia Resides, [place], [place] Registered Dietitian [phone] (mobile)

## 2021-01-10 NOTE — Progress Notes (Signed)
Pt here for follow up. No new concerns voiced.  Pt scheduled for port placement on 9/20.

## 2021-01-10 NOTE — Progress Notes (Signed)
Tumor Board Documentation  Rebecca Parker was presented by Dr. Theora Gianotti at our Tumor Board on 01/10/2021, which included representatives from medical oncology, radiation oncology, surgical oncology, navigation, pathology.  Rebecca Parker currently presents as a new patient, for [facility], for new positive pathology (Reviewed cervical biopsy and curettages: consistent with invasive squamous cell carcinoma of cervix.) with history of the following treatments: none.  Additionally, we reviewed previous medical and familial history, history of present illness, and recent lab results along with all available histopathologic and imaging studies. The tumor board considered available treatment options and made the following recommendations:   ENT consultation, has seen Dr. Baruch Gouty with radiation oncology, has seen Dr. Tasia Catchings with medical oncology. PD-L1 testing was recommended by Dr. Theora Gianotti as she felt patient was high risk for recurrent disease.  The following procedures/referrals were also placed: No orders of the defined types were placed in this encounter.   Clinical Trial Status: not discussed   Staging used:    National site-specific guidelines   were discussed with respect to the case.  Tumor board is a meeting of clinicians from various specialty areas who evaluate and discuss patients for whom a multidisciplinary approach is being considered. Final determinations in the plan of care are those of the provider(s). The responsibility for follow up of recommendations given during tumor board is that of the provider.   Today's extended care, comprehensive team conference, Rebecca Parker was not present for the discussion and was not examined.

## 2021-01-10 NOTE — Telephone Encounter (Signed)
Request for PDL testing on  Cervix specimen [id number] has been faxed to [facility] pathology.

## 2021-01-11 ENCOUNTER — Inpatient Hospital Stay: Payer: Medicare Other

## 2021-01-11 ENCOUNTER — Ambulatory Visit
Admission: RE | Admit: 2021-01-11 | Discharge: 2021-01-11 | Disposition: A | Discharge status: Home / Self Care | Payer: Medicare Other | Source: Ambulatory Visit | Attending: Radiation Oncology | Admitting: Radiation Oncology

## 2021-01-11 DIAGNOSIS — D518 Other vitamin B12 deficiency anemias: Secondary | ICD-10-CM

## 2021-01-11 DIAGNOSIS — C539 Malignant neoplasm of cervix uteri, unspecified: Secondary | ICD-10-CM | POA: Diagnosis not present

## 2021-01-11 DIAGNOSIS — Z5111 Encounter for antineoplastic chemotherapy: Secondary | ICD-10-CM | POA: Diagnosis not present

## 2021-01-11 MED ORDER — CYANOCOBALAMIN 1000 MCG/ML IJ SOLN
1000.0000 ug | Freq: Once | INTRAMUSCULAR | Status: AC
Start: 2021-01-11 — End: 2021-01-11
  Administered 2021-01-11: 1000 ug via INTRAMUSCULAR
  Filled 2021-01-11: qty 1

## 2021-01-12 ENCOUNTER — Inpatient Hospital Stay: Payer: Medicare Other

## 2021-01-12 ENCOUNTER — Other Ambulatory Visit: Payer: Self-pay

## 2021-01-12 ENCOUNTER — Ambulatory Visit
Admission: RE | Admit: 2021-01-12 | Discharge: 2021-01-12 | Disposition: A | Discharge status: Home / Self Care | Payer: Medicare Other | Source: Ambulatory Visit | Attending: Radiation Oncology | Admitting: Radiation Oncology

## 2021-01-12 DIAGNOSIS — Z5111 Encounter for antineoplastic chemotherapy: Secondary | ICD-10-CM | POA: Diagnosis not present

## 2021-01-12 DIAGNOSIS — D518 Other vitamin B12 deficiency anemias: Secondary | ICD-10-CM

## 2021-01-12 DIAGNOSIS — C539 Malignant neoplasm of cervix uteri, unspecified: Secondary | ICD-10-CM | POA: Diagnosis not present

## 2021-01-12 MED ORDER — CYANOCOBALAMIN 1000 MCG/ML IJ SOLN
1000.0000 ug | Freq: Once | INTRAMUSCULAR | Status: AC
  Administered 2021-01-12: 1000 ug via INTRAMUSCULAR
  Filled 2021-01-12: qty 1

## 2021-01-15 ENCOUNTER — Other Ambulatory Visit: Payer: Self-pay

## 2021-01-15 ENCOUNTER — Inpatient Hospital Stay: Payer: Medicare Other

## 2021-01-15 ENCOUNTER — Ambulatory Visit
Admission: RE | Admit: 2021-01-15 | Discharge: 2021-01-15 | Disposition: A | Discharge status: Home / Self Care | Payer: Medicare Other | Source: Ambulatory Visit | Attending: Radiation Oncology | Admitting: Radiation Oncology

## 2021-01-15 ENCOUNTER — Other Ambulatory Visit: Payer: Self-pay | Admitting: Oncology

## 2021-01-15 DIAGNOSIS — Z5111 Encounter for antineoplastic chemotherapy: Secondary | ICD-10-CM | POA: Diagnosis not present

## 2021-01-15 DIAGNOSIS — C539 Malignant neoplasm of cervix uteri, unspecified: Secondary | ICD-10-CM

## 2021-01-15 LAB — BASIC METABOLIC PANEL
Anion gap: 9 (ref 5–15)
BUN: 8 mg/dL (ref 8–23)
CO2: 26 mmol/L (ref 22–32)
Calcium: 8.5 mg/dL — ABNORMAL LOW (ref 8.9–10.3)
Chloride: 104 mmol/L (ref 98–111)
Creatinine, Ser: 0.77 mg/dL (ref 0.44–1.00)
GFR, Estimated: >60 mL/min (ref >60)
Glucose, Bld: 89 mg/dL (ref 70–99)
Potassium: 3.6 mmol/L (ref 3.5–5.1)
Sodium: 139 mmol/L (ref 135–145)

## 2021-01-15 NOTE — Progress Notes (Signed)
DISCONTINUE OFF PATHWAY REGIMEN - Other   [id number]:Carboplatin AUC=1.5 Weekly + RT:   Administer weekly:     Carboplatin   **Always confirm dose/schedule in your pharmacy ordering system**  REASON: Other Reason PRIOR TREATMENT: Carboplatin AUC=1.5 Weekly + RT TREATMENT RESPONSE: Unable to Evaluate  START OFF PATHWAY REGIMEN - Other   [id number]:Cisplatin 40 mg/m2 IV D1 q7 Days + RT:   A cycle is every 7 days:     Cisplatin   **Always confirm dose/schedule in your pharmacy ordering system**  Patient Characteristics: Intent of Therapy: Curative Intent, Discussed with Patient

## 2021-01-16 ENCOUNTER — Other Ambulatory Visit: Payer: Self-pay

## 2021-01-16 ENCOUNTER — Ambulatory Visit: Payer: Medicare Other

## 2021-01-16 ENCOUNTER — Inpatient Hospital Stay: Payer: Medicare Other

## 2021-01-16 ENCOUNTER — Other Ambulatory Visit (INDEPENDENT_AMBULATORY_CARE_PROVIDER_SITE_OTHER): Payer: Self-pay | Admitting: Nurse Practitioner

## 2021-01-16 ENCOUNTER — Ambulatory Visit
Admission: RE | Admit: 2021-01-16 | Discharge: 2021-01-16 | Disposition: A | Discharge status: Home / Self Care | Payer: Medicare Other | Attending: Vascular Surgery | Admitting: Vascular Surgery

## 2021-01-16 ENCOUNTER — Encounter
Admission: RE | Disposition: A | Discharge status: Home / Self Care | Payer: Self-pay | Source: Home / Self Care | Attending: Vascular Surgery

## 2021-01-16 ENCOUNTER — Encounter: Payer: Self-pay | Admitting: Vascular Surgery

## 2021-01-16 DIAGNOSIS — C539 Malignant neoplasm of cervix uteri, unspecified: Secondary | ICD-10-CM

## 2021-01-16 DIAGNOSIS — Z7982 Long term (current) use of aspirin: Secondary | ICD-10-CM | POA: Diagnosis not present

## 2021-01-16 DIAGNOSIS — Z79899 Other long term (current) drug therapy: Secondary | ICD-10-CM | POA: Diagnosis not present

## 2021-01-16 DIAGNOSIS — Z888 Allergy status to other drugs, medicaments and biological substances status: Secondary | ICD-10-CM | POA: Diagnosis not present

## 2021-01-16 DIAGNOSIS — Z7984 Long term (current) use of oral hypoglycemic drugs: Secondary | ICD-10-CM | POA: Diagnosis not present

## 2021-01-16 DIAGNOSIS — F17210 Nicotine dependence, cigarettes, uncomplicated: Secondary | ICD-10-CM | POA: Diagnosis not present

## 2021-01-16 HISTORY — PX: PORTA CATH INSERTION: CATH118285

## 2021-01-16 LAB — GLUCOSE, CAPILLARY: Glucose-Capillary: 94 mg/dL (ref 70–99)

## 2021-01-16 SURGERY — PORTA CATH INSERTION
Anesthesia: Moderate Sedation

## 2021-01-16 MED ORDER — ONDANSETRON HCL 4 MG/2ML IJ SOLN: 4.0000 mg | Freq: Four times a day (QID) | INTRAMUSCULAR | Status: DC | PRN

## 2021-01-16 MED ORDER — CEFAZOLIN SODIUM-DEXTROSE 2-4 GM/100ML-% IV SOLN: 2.0000 g | Freq: Once | INTRAVENOUS | Status: AC

## 2021-01-16 MED ORDER — CHLORHEXIDINE GLUCONATE CLOTH 2 % EX PADS: 6.0000 | MEDICATED_PAD | Freq: Every day | CUTANEOUS | Status: DC

## 2021-01-16 MED ORDER — FENTANYL CITRATE (PF) 100 MCG/2ML IJ SOLN
INTRAMUSCULAR | Status: AC
  Filled 2021-01-16: qty 2

## 2021-01-16 MED ORDER — FAMOTIDINE 20 MG PO TABS: 40.0000 mg | ORAL_TABLET | Freq: Once | ORAL | Status: DC | PRN

## 2021-01-16 MED ORDER — FENTANYL CITRATE (PF) 100 MCG/2ML IJ SOLN
INTRAMUSCULAR | Status: DC | PRN
  Administered 2021-01-16: 25 ug via INTRAVENOUS
  Administered 2021-01-16: 50 ug via INTRAVENOUS

## 2021-01-16 MED ORDER — CEFAZOLIN SODIUM-DEXTROSE 2-4 GM/100ML-% IV SOLN
INTRAVENOUS | Status: AC
  Administered 2021-01-16: 2 g via INTRAVENOUS
  Filled 2021-01-16: qty 100

## 2021-01-16 MED ORDER — MIDAZOLAM HCL 2 MG/2ML IJ SOLN
INTRAMUSCULAR | Status: AC
  Filled 2021-01-16: qty 2

## 2021-01-16 MED ORDER — MIDAZOLAM HCL 2 MG/2ML IJ SOLN
INTRAMUSCULAR | Status: DC | PRN
  Administered 2021-01-16: .5 mg via INTRAVENOUS
  Administered 2021-01-16: 2 mg via INTRAVENOUS

## 2021-01-16 MED ORDER — HYDROMORPHONE HCL 1 MG/ML IJ SOLN: 1.0000 mg | Freq: Once | INTRAMUSCULAR | Status: DC | PRN

## 2021-01-16 MED ORDER — SODIUM CHLORIDE 0.9 % IV SOLN: INTRAVENOUS | Status: DC

## 2021-01-16 MED ORDER — METHYLPREDNISOLONE SODIUM SUCC 125 MG IJ SOLR: 125.0000 mg | Freq: Once | INTRAMUSCULAR | Status: DC | PRN

## 2021-01-16 MED ORDER — CEFAZOLIN SODIUM-DEXTROSE 2-4 GM/100ML-% IV SOLN
INTRAVENOUS | Status: AC
  Filled 2021-01-16: qty 100

## 2021-01-16 MED ORDER — MIDAZOLAM HCL 2 MG/ML PO SYRP: 8.0000 mg | ORAL_SOLUTION | Freq: Once | ORAL | Status: DC | PRN

## 2021-01-16 SURGICAL SUPPLY — 11 items
ADH SKN CLS APL DERMABOND .7 (GAUZE/BANDAGES/DRESSINGS) ×1
CANNULA 5F STIFF (CANNULA) ×2 IMPLANT
COVER SURGICAL LIGHT HANDLE (MISCELLANEOUS) ×2 IMPLANT
DERMABOND ADVANCED (GAUZE/BANDAGES/DRESSINGS) ×1
DERMABOND ADVANCED .7 DNX12 (GAUZE/BANDAGES/DRESSINGS) ×1 IMPLANT
DRAPE INCISE IOBAN 66X45 STRL (DRAPES) ×2 IMPLANT
KIT PORT POWER 8FR ISP CVUE (Port) ×2 IMPLANT
PACK ANGIOGRAPHY (CUSTOM PROCEDURE TRAY) ×2 IMPLANT
SUT MNCRL AB 4-0 PS2 18 (SUTURE) ×2 IMPLANT
SUT VIC AB 3-0 SH 27 (SUTURE) ×2
SUT VIC AB 3-0 SH 27X BRD (SUTURE) ×1 IMPLANT

## 2021-01-16 NOTE — Interval H&P Note (Signed)
History and Physical Interval Note:  01/16/2021 3:04 PM  Rebecca Parker  has presented today for surgery, with the diagnosis of Porta Cath Placement   Cervical Ca.  The various methods of treatment have been discussed with the patient and family. After consideration of risks, benefits and other options for treatment, the patient has consented to  Procedure(s): PORTA CATH INSERTION (N/A) as a surgical intervention.  The patient's history has been reviewed, patient examined, no change in status, stable for surgery.  I have reviewed the patient's chart and labs.  Questions were answered to the patient's satisfaction.     Hortencia Pilar

## 2021-01-16 NOTE — H&P (Signed)
@LOGO @   MRN : [id number]  Rebecca Parker is a 70 y.o. (08-21-1950) female who presents with chief complaint of put in a port.  History of Present Illness:   Patient presents to [facility] today for placement of an Infuse-a-Port at the request of Dr. Tasia Catchings.  Patient has a diagnosis of invasive carcinoma of the cervix.  She has been receiving weekly rounds of chemotherapy.  At this point she requires a more stable parenteral access. Current Meds  Medication Sig   aspirin EC 81 MG tablet Take 81 mg by mouth daily.   Calcium Carbonate-Vitamin D 500-125 MG-UNIT TABS Take 1 tablet by mouth daily.   docusate sodium (COLACE) 100 MG capsule Take 100 mg by mouth daily.   Ferrous Sulfate (IRON) 325 (65 Fe) MG TABS Take 1 tablet by mouth 2 (two) times daily.   furosemide (LASIX) 20 MG tablet Take 20 mg by mouth 2 (two) times daily.   gabapentin (NEURONTIN) 800 MG tablet Take 800 mg by mouth 2 (two) times daily.   hydrOXYzine (ATARAX/VISTARIL) 25 MG tablet Take 25 mg by mouth daily. Take 25mg . By mouth every morning   losartan (COZAAR) 50 MG tablet Take 50 mg by mouth 2 (two) times daily.   metFORMIN (GLUCOPHAGE) 500 MG tablet Take 500 mg by mouth 2 (two) times daily with a meal.   mirtazapine (REMERON) 15 MG tablet Take 15 mg by mouth at bedtime.   ondansetron (ZOFRAN) 4 MG tablet Take 1 tablet (4 mg total) by mouth every 6 (six) hours as needed for nausea.   oxyCODONE (OXY IR/ROXICODONE) 5 MG immediate release tablet Take 1 tablet (5 mg total) by mouth every 8 (eight) hours as needed for moderate pain.   simvastatin (ZOCOR) 20 MG tablet Take 20 mg by mouth daily.    Past Medical History:  Diagnosis Date   B12 deficiency anemia 01/03/2021   Chest pain, unspecified    CHF (congestive heart failure) ([place])    Diabetes mellitus without complication (HCC)    Hip pain    Hypertension    Multinodular goiter     Past Surgical History:  Procedure Laterality Date   COLONOSCOPY WITH  PROPOFOL N/A 11/24/2015   Procedure: COLONOSCOPY WITH PROPOFOL;  Surgeon: Lollie Sails, MD;  Location: [facility] ENDOSCOPY;  Service: Endoscopy;  Laterality: N/A;   COLONOSCOPY WITH PROPOFOL N/A 03/18/2019   Procedure: COLONOSCOPY WITH PROPOFOL;  Surgeon: Virgel Manifold, MD;  Location: [facility] ENDOSCOPY;  Service: Endoscopy;  Laterality: N/A;   DILATION AND CURETTAGE OF UTERUS N/A 12/01/2020   Procedure: DILATATION AND CURETTAGE with cervical biopsies;  Surgeon: Schermerhorn, Gwen Her, MD;  Location: [facility] ORS;  Service: Gynecology;  Laterality: N/A;   ESOPHAGOGASTRODUODENOSCOPY (EGD) WITH PROPOFOL N/A 03/18/2019   Procedure: ESOPHAGOGASTRODUODENOSCOPY (EGD) WITH PROPOFOL;  Surgeon: Virgel Manifold, MD;  Location: [facility] ENDOSCOPY;  Service: Endoscopy;  Laterality: N/A;   right ankle orif      Social History Social History   Tobacco Use   Smoking status: Every Day    Packs/day: 0.10    Types: Cigarettes   Smokeless tobacco: Never  Substance Use Topics   Alcohol use: Not Currently    Family History Family History  Problem Relation Age of Onset   Breast cancer Maternal Aunt    Breast cancer Paternal Aunt     Allergies  Allergen Reactions   Ace Inhibitors Itching    Rash   Cyclobenzaprine Itching    Rash and 'made me nervous'  REVIEW OF SYSTEMS (Negative unless checked)  Constitutional: [] Weight loss  [] Fever  [] Chills Cardiac: [] Chest pain   [] Chest pressure   [] Palpitations   [] Shortness of breath when laying flat   [] Shortness of breath with exertion. Vascular:  [] Pain in legs with walking   [] Pain in legs at rest  [] History of DVT   [] Phlebitis   [x] Swelling in legs   [] Varicose veins   [] Non-healing ulcers Pulmonary:   [] Uses home oxygen   [] Productive cough   [] Hemoptysis   [] Wheeze  [] COPD   [] Asthma Neurologic:  [] Dizziness   [] Seizures   [] History of stroke   [] History of TIA  [] Aphasia   [] Vissual changes   [] Weakness or numbness in arm   [] Weakness or  numbness in leg Musculoskeletal:   [] Joint swelling   [] Joint pain   [] Low back pain Hematologic:  [] Easy bruising  [] Easy bleeding   [] Hypercoagulable state   [] Anemic Gastrointestinal:  [] Diarrhea   [] Vomiting  [] Gastroesophageal reflux/heartburn   [] Difficulty swallowing. Genitourinary:  [] Chronic kidney disease   [] Difficult urination  [] Frequent urination   [] Blood in urine Skin:  [] Rashes   [] Ulcers  Psychological:  [] History of anxiety   []  History of major depression.  Physical Examination  Vitals:   01/16/21 1132 01/16/21 1453 01/16/21 1458 01/16/21 1500  BP: 116/74     Pulse: 88     Resp: 18     Temp: 98.6 F (37 C)     TempSrc: Oral     SpO2: 100% 100% 100% 100%  Weight: 66.5 kg     Height: 5' 1.5" (1.562 m)      Body mass index is 27.27 kg/m. Gen: WD/WN, NAD Head: [place]/AT, No temporalis wasting.  Ear/Nose/Throat: Hearing grossly intact, nares w/o erythema or drainage, pinna without lesions Eyes: PER, EOMI, sclera nonicteric.  Neck: Supple, no gross masses.  No JVD.  Pulmonary:  Good air movement, no audible wheezing, no use of accessory muscles.  Cardiac: RRR, precordium not hyperdynamic. Vascular:   Vessel Right Left  Radial Palpable Palpable  Gastrointestinal: soft, non-distended. No guarding/no peritoneal signs.  Musculoskeletal: M/S 5/5 throughout.  No deformity.  Neurologic: CN 2-12 intact. Pain and light touch intact in extremities.  Symmetrical.  Speech is fluent. Motor exam as listed above. Psychiatric: Judgment intact, Mood & affect appropriate for pt's clinical situation. Dermatologic: Venous rashes no ulcers noted.  No changes consistent with cellulitis. Lymph : No lichenification or skin changes of chronic lymphedema.  CBC Lab Results  Component Value Date   WBC 6.7 01/10/2021   HGB 9.4 (L) 01/10/2021   HCT 29.2 (L) 01/10/2021   MCV 108.6 (H) 01/10/2021   PLT 321 01/10/2021    BMET    Component Value Date/Time   NA 139 01/15/2021 1435   NA  141 02/24/2019 1550   NA 141 11/01/2013 1021   K 3.6 01/15/2021 1435   K 4.0 11/01/2013 1021   CL 104 01/15/2021 1435   CL 109 (H) 11/01/2013 1021   CO2 26 01/15/2021 1435   CO2 27 11/01/2013 1021   GLUCOSE 89 01/15/2021 1435   GLUCOSE 102 (H) 11/01/2013 1021   BUN 8 01/15/2021 1435   BUN 10 02/24/2019 1550   BUN 8 11/01/2013 1021   CREATININE 0.77 01/15/2021 1435   CREATININE 1.13 11/01/2013 1021   CALCIUM 8.5 (L) 01/15/2021 1435   CALCIUM 8.4 (L) 11/01/2013 1021   GFRNONAA >60 01/15/2021 1435   GFRNONAA 51 (L) 11/01/2013 1021   GFRAA 68 02/24/2019  [place] (L) 11/01/2013 1021   Estimated Creatinine Clearance: 56.2 mL/min (by C-G formula based on SCr of 0.77 mg/dL).  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 70 year old female. EXAM: NUCLEAR MEDICINE PET SKULL BASE TO THIGH TECHNIQUE: 8.1 mCi F-18 FDG was injected intravenously. Full-ring PET imaging was performed from the skull base to thigh after the radiotracer. CT data was obtained and used for attenuation correction and anatomic localization. Fasting blood glucose: 76 mg/dl COMPARISON:  CT abdomen and pelvis of August of 2022. FINDINGS: Mediastinal blood pool activity: SUV max 2.1 Liver activity: SUV max NA NECK: Asymmetric fullness of RIGHT glossotonsillar sulcus and activity associated with this area on FDG PET maximum SUV 7.6 (image 38/3) contralateral activity 5.0 no discrete adenopathy in the neck. Asymmetric thyroid activity with nodule in the RIGHT thyroid showing maximum SUV of 5.9 (image 58/3) nodule measuring up to 2.8 cm. Incidental CT findings: none CHEST: RIGHT thyroid abnormality as described. Some nodularity of the LEFT thyroid. No signs of hypermetabolic lymph nodes or suspicious pulmonary nodules in the chest.  Lymph nodes with fatty hila in the LEFT axilla maximum SUV of 2.5 Incidental CT findings: Aortic atherosclerosis. Three-vessel coronary artery disease. No consolidation or evidence of pleural effusion. ABDOMEN/PELVIS: Diffuse activity throughout the uterus and cervix with a maximum SUV of 16.3. (Image 221/3) cervix is bulky measuring 6.3 x 4.0 cm at this level. Activity extends throughout the entire uterus. Multi cystic RIGHT adnexal lesion shows increased metabolic activity at the lateral margin with a maximum SUV of 6.7 (image 204/3) 6.2 x 4.5 cm. Small LEFT external iliac lymph nodes and common iliac lymph nodes are noted, most hypermetabolic with a maximum SUV of 4.1 measuring 7 mm (image 181/3 Incidental CT findings: No acute findings relative to liver, gallbladder, pancreas, spleen, adrenal glands, kidneys, stomach, small or large bowel. No pericecal stranding. Appendix not visualized. Colonic diverticulosis. SKELETON: No focal hypermetabolic activity to suggest skeletal metastasis. Incidental CT findings: Spinal degenerative changes. IMPRESSION: Signs of cervical cancer with diffuse involvement of the uterus. Cystic and solid RIGHT ovarian lesion more likely related to diffuse cervical cancer. Concomitant synchronous cystic ovarian neoplasm could have this appearance but is felt less likely given the diffuse nature of disease throughout the uterus and cervix. Small lymph nodes in the pelvis in the LEFT pelvis suspicious for nodal involvement at the common iliac level. No solid organ or distant metastasis. RIGHT lingual tonsil and glossotonsillar sulcus uptake which is asymmetric and with increased fullness. While this may be physiologic would suggest direct visualization for further evaluation to exclude neoplasm in this area in the head and neck. RIGHT thyroid uptake with visible nodule. Recommend thyroid US and biopsy (ref: J Am Coll Radiol. 2015 Feb;12(2): 143-50). Aortic Atherosclerosis (ICD10-I70.0).  Electronically Signed   By: Zetta Bills M.D.   On: 12/28/2020 18:06     Assessment/Plan 1.  Invasive carcinoma of the cervix: Patient will undergo placement of an Infuse-a-Port.  Risks and benefits of been reviewed.  Patient's questions have been answered.  She is willing to proceed.   Hortencia Pilar, MD  01/16/2021 3:00 PM

## 2021-01-16 NOTE — Progress Notes (Signed)
Patient to [facility] today for port placement. Per patient report, she is anxious about procedure. Discussed with patient and answered questions/concerns. Offered to have MD come to bedside to answer any questions/concerns, patient refused to have MD come to bedside, however agreed to proceed today. Patient stated she "did not eat since dinner last night", but then stated that she ate a piece of apple pie at 9:00am this morning, MD notified and agreed to do procedure today at later time due to non-NPO. Notified patient that procedure would be performed closer to 3pm due to having ate, patient was very upset toward staff. With patient permission, called to notify daughter of delay, daughter in agreement and stated she would return to pick up patient at later time, to call and notify her of discharge time. During IV attempt x 2, patient yelling and pulling/jerking arm away from staff at time of insertion, both IV attempts failed and patient upset threatening staff stating that they are incompetent. Pt requested a female nurse to perform IV stick, female RN notified and will come attempt IV as soon as available.

## 2021-01-16 NOTE — Op Note (Signed)
OPERATIVE NOTE   PROCEDURE: Placement of a right IJ Infuse-a-Port  PRE-OPERATIVE DIAGNOSIS: Invasive cervical cancer  POST-OPERATIVE DIAGNOSIS: Same  SURGEON: Katha Cabal M.D.  ANESTHESIA: Conscious sedation was administered under my direct supervision by the interventional radiology RN. IV Versed plus fentanyl were utilized. Continuous ECG, pulse oximetry and blood pressure was monitored throughout the entire procedure. Conscious sedation was for a total of 33 minutes 18 seconds.  ESTIMATED BLOOD LOSS: Minimal  Fluoroscopy time:  0.1 minutes  FINDING(S): 1.  Patent vein  SPECIMEN(S): None  INDICATIONS:   Rebecca Parker is a 70 y.o. female who presents with invasive cervical cancer.  She has been undergoing chemotherapy but venous access has become increasingly difficult.  She is now undergoing placement of an Infuse-a-Port to provide a stable parenteral access.  Risks and benefits of been reviewed all questions answered patient has agreed to proceed  DESCRIPTION: After obtaining full informed written consent, the patient was brought back to the special procedure suite and placed in the supine position. The patient's right neck and chest wall are prepped and draped in sterile fashion. Appropriate timeout was called.  Ultrasound is placed in a sterile sleeve, ultrasound is utilized to avoid vascular injury as well as secondary to lack of appropriate landmarks. The right internal jugular vein is identified. It is echolucent and homogeneous as well as easily compressible indicating patency. An image is recorded for the permanent record.  Access to the vein with a micropuncture needle is done under direct ultrasound visualization.  1% lidocaine is infiltrated into the soft tissue at the base of the neck as well as on the chest wall.  Under direct ultrasound visualization a micro-needle is inserted into the vein followed by the micro-wire. Micro-sheath was then advanced and a J wire  is inserted without difficulty under fluoroscopic guidance. A small counterincision was created at the wire insertion site. A transverse incision is created 2 fingerbreadths below the scapula and a pocket is fashioned using both blunt and sharp dissection. The pocket is tested for appropriate size with the hub of the Infuse-a-Port. The tunneling device is then used to pull the intravascular portion of the catheter from the pocket to the neck counterincision.  Dilator and peel-away sheath were then inserted over the wire and the wire is removed. Catheter is then advanced into the venous system without difficulty. Peel-away sheath was then removed.  Catheter is then positioned under fluoroscopic guidance at the atrial caval junction. It is then transected connected to the hub and the hope is slipped into the subcutaneous pocket on the chest wall. The hub was then accessed percutaneously and aspirates easily and flushes well and is flushed with 30 cc of heparinized saline. The pocket incision is then closed in layers using interrupted 3-0 Vicryl for the subcutaneous tissues and 4-0 Monocryl subcuticular for skin closure. Dermabond is applied. The neck counterincision was closed with 4-0 Monocryl subcuticular and Dermabond as well.  The patient tolerated the procedure well and there were no immediate complications.  COMPLICATIONS: None  CONDITION: Unchanged  Katha Cabal M.D. [facility] Office: [phone]   01/16/2021, 3:37 PM

## 2021-01-17 ENCOUNTER — Encounter: Payer: Self-pay | Admitting: Vascular Surgery

## 2021-01-17 ENCOUNTER — Inpatient Hospital Stay: Payer: Medicare Other

## 2021-01-17 ENCOUNTER — Inpatient Hospital Stay (HOSPITAL_BASED_OUTPATIENT_CLINIC_OR_DEPARTMENT_OTHER): Payer: Medicare Other | Admitting: Oncology

## 2021-01-17 ENCOUNTER — Ambulatory Visit
Admission: RE | Admit: 2021-01-17 | Discharge: 2021-01-17 | Disposition: A | Discharge status: Home / Self Care | Payer: Medicare Other | Source: Ambulatory Visit | Attending: Radiation Oncology | Admitting: Radiation Oncology

## 2021-01-17 VITALS — BP 136/96 | HR 80 | Temp 99.0°F | Resp 17 | Wt 149.0 lb

## 2021-01-17 DIAGNOSIS — C539 Malignant neoplasm of cervix uteri, unspecified: Secondary | ICD-10-CM

## 2021-01-17 DIAGNOSIS — D518 Other vitamin B12 deficiency anemias: Secondary | ICD-10-CM

## 2021-01-17 DIAGNOSIS — R948 Abnormal results of function studies of other organs and systems: Secondary | ICD-10-CM

## 2021-01-17 DIAGNOSIS — Z5111 Encounter for antineoplastic chemotherapy: Secondary | ICD-10-CM | POA: Diagnosis not present

## 2021-01-17 DIAGNOSIS — Z95828 Presence of other vascular implants and grafts: Secondary | ICD-10-CM

## 2021-01-17 LAB — COMPREHENSIVE METABOLIC PANEL
ALT: 11 U/L (ref 0–44)
AST: 16 U/L (ref 15–41)
Albumin: 3 g/dL — ABNORMAL LOW (ref 3.5–5.0)
Alkaline Phosphatase: 56 U/L (ref 38–126)
Anion gap: 8 (ref 5–15)
BUN: 9 mg/dL (ref 8–23)
CO2: 27 mmol/L (ref 22–32)
Calcium: 8.8 mg/dL — ABNORMAL LOW (ref 8.9–10.3)
Chloride: 106 mmol/L (ref 98–111)
Creatinine, Ser: 1.01 mg/dL — ABNORMAL HIGH (ref 0.44–1.00)
GFR, Estimated: 59 mL/min — ABNORMAL LOW (ref >60)
Glucose, Bld: 82 mg/dL (ref 70–99)
Potassium: 4 mmol/L (ref 3.5–5.1)
Sodium: 141 mmol/L (ref 135–145)
Total Bilirubin: 0.5 mg/dL (ref 0.3–1.2)
Total Protein: 6.8 g/dL (ref 6.5–8.1)

## 2021-01-17 LAB — CBC WITH DIFFERENTIAL/PLATELET
Abs Immature Granulocytes: 0.02 10*3/uL (ref 0.00–0.07)
Basophils Absolute: 0 10*3/uL (ref 0.0–0.1)
Basophils Relative: 0 %
Eosinophils Absolute: 0.5 10*3/uL (ref 0.0–0.5)
Eosinophils Relative: 8 %
HCT: 29.9 % — ABNORMAL LOW (ref 36.0–46.0)
Hemoglobin: 9.9 g/dL — ABNORMAL LOW (ref 12.0–15.0)
Immature Granulocytes: 0 %
Lymphocytes Relative: 13 %
Lymphs Abs: 0.8 10*3/uL (ref 0.7–4.0)
MCH: 36.4 pg — ABNORMAL HIGH (ref 26.0–34.0)
MCHC: 33.1 g/dL (ref 30.0–36.0)
MCV: 109.9 fL — ABNORMAL HIGH (ref 80.0–100.0)
Monocytes Absolute: 0.5 10*3/uL (ref 0.1–1.0)
Monocytes Relative: 8 %
Neutro Abs: 4.2 10*3/uL (ref 1.7–7.7)
Neutrophils Relative %: 71 %
Platelets: 317 10*3/uL (ref 150–400)
RBC: 2.72 MIL/uL — ABNORMAL LOW (ref 3.87–5.11)
RDW: 16.8 % — ABNORMAL HIGH (ref 11.5–15.5)
WBC: 6 10*3/uL (ref 4.0–10.5)
nRBC: 0 % (ref 0.0–0.2)

## 2021-01-17 MED ORDER — SODIUM CHLORIDE 0.9 % IV SOLN
30.0000 mg/m2 | Freq: Once | INTRAVENOUS | Status: AC
  Administered 2021-01-17: 51 mg via INTRAVENOUS
  Filled 2021-01-17: qty 51

## 2021-01-17 MED ORDER — PALONOSETRON HCL INJECTION 0.25 MG/5ML
0.2500 mg | Freq: Once | INTRAVENOUS | Status: AC
  Administered 2021-01-17: 0.25 mg via INTRAVENOUS
  Filled 2021-01-17: qty 5

## 2021-01-17 MED ORDER — HEPARIN SOD (PORK) LOCK FLUSH 100 UNIT/ML IV SOLN
INTRAVENOUS | Status: AC
  Filled 2021-01-17: qty 5

## 2021-01-17 MED ORDER — SODIUM CHLORIDE 0.9 % IV SOLN
10.0000 mg | Freq: Once | INTRAVENOUS | Status: AC
  Administered 2021-01-17: 10 mg via INTRAVENOUS
  Filled 2021-01-17: qty 10

## 2021-01-17 MED ORDER — SODIUM CHLORIDE 0.9 % IV SOLN
Freq: Once | INTRAVENOUS | Status: AC
  Filled 2021-01-17: qty 250

## 2021-01-17 MED ORDER — SODIUM CHLORIDE 0.9 % IV SOLN
150.0000 mg | Freq: Once | INTRAVENOUS | Status: AC
  Administered 2021-01-17: 150 mg via INTRAVENOUS
  Filled 2021-01-17: qty 150

## 2021-01-17 MED ORDER — POTASSIUM CHLORIDE IN NACL 20-0.9 MEQ/L-% IV SOLN
Freq: Once | INTRAVENOUS | Status: AC
  Filled 2021-01-17: qty 1000

## 2021-01-17 MED ORDER — MAGNESIUM SULFATE 2 GM/50ML IV SOLN
2.0000 g | Freq: Once | INTRAVENOUS | Status: AC
  Administered 2021-01-17: 2 g via INTRAVENOUS
  Filled 2021-01-17: qty 50

## 2021-01-17 NOTE — Patient Instructions (Addendum)
[facility] [phone] Dr. Carloyn Manner October 14th 10:30  [place], [place], [place] [address] (located behind the hospital)

## 2021-01-17 NOTE — Progress Notes (Signed)
Called [facility] and arranged appointment with Dr. Pryor Ochoa, 02/09/21 at 1030. Called and spoke with daughter, Baker Janus, and provided this appointment. Also provided phone number to [facility] for any changes she may need to make. Encouraged her to go to this appointment with Ms. Cortese. I have asked if the appointment changes to call us with the new appointment date for our follow up.

## 2021-01-17 NOTE — Progress Notes (Signed)
Hematology/Oncology progress note [facility] Telephone:([phone] [phone]   Patient Care Team: Center, [facility] as PCP - General (General Practice) Clent Jacks, RN as Oncology Nurse Navigator Earlie Server, MD as Consulting Physician (Oncology) Earlie Server, MD as Consulting Physician (Hematology and Oncology)  REFERRING PROVIDER: [facility], [place] VISIT:  Follow up for treatment of cervical cancer  HISTORY OF PRESENTING ILLNESS:   Rebecca Parker is a  70 y.o.  female with PMH listed below was seen in consultation at the request of  Center, Jenny Reichmann*  for evaluation of cervical cancer   12/01/2020-8/9/022 She was admitted from the ED for 1 day h/o right lower abdominal / pelvic pain . 12/01/2020 CT abdomen pelvis w contrast showed  thickly septated right ovarian mass measuring 5.1 x 5.1 cm. 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 Recommend initial pelvic ultrasound and likely subsequent MRI to further evaluate, which may be performed on a nonemergent basis. 2. Air within the fundal endometrial cavity. Correlate for recent instrumentation. 3. Pancolonic diverticulosis without evidence of acute diverticulitis.  12/01/2020 Fx D+C  and cervical bx. Uterus sounded to 9 cm.   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   Diastolic CHF and Diabetes.  UDS + for cocaine.   Today she is accompanied by her sister.  She is the oldest of 7 kids (5 girls and 2 boys). She has 3 children - all SVDs - 2 boys and one girl.  She was seen by GynOnc Dr.Secord.  Her pelvic examination showed On palpations  suspect lateral vaginal disease >50% down the vault and disease involving the left fornix. Cervix enlarged >4 cm with grossly obvious tumor and hard to palpation. Uterus - may be enlarged with firm mass on the right aspect versus palpation of the right adnexal mass. Positive parametrial involvement on the right and on the left with disease to or almost to the sidewall on the left. Rectovaginal exam was confirmatory.    # 8/31 2022, PET scan showed signs of cervical cancer with diffuse involvement of the uterus.  Cystic and solid right ovarian lesion more likely related to diffuse cervical cancer.  Concomitant synchronous cystic ovarian neoplasm could have appearance but feel less likely given the diffuse nature of disease throughout the uterus and cervix.  Small lymph nodes in the pelvis in the left pelvis suspicious for nodal involvement at the common iliac level.  No solid organ distant metastasis. Right lung renal consult and glossotonsillar sulcus uptake which is asymmetric and with increased fullness.  While this may be physiologic, will suggest direct visualization for further evaluation to exclude neoplasm. Right thyroid uptake with visible nodule.  Recommend ultrasound thyroid and biopsy.  Patient has had baseline audiogram done and chemotherapy class.  INTERVAL HISTORY Rebecca Parker is a 70 y.o. female who has above history reviewed by me today presents for follow up visit for treatment of cervix cancer. Problems and complaints are listed below: She is on concurrent chemo-RT She tolerates well. She has noticed lower extremity swelling, which goes down after leg elevation. Current she reports no leg swelling. No fever, chills, nausea, vomiting.  She is a poor  historian. She is here by herself. I called her daughter Baker Janus.   Review of Systems  Constitutional:  Positive for fatigue. Negative for appetite change, chills and fever.  HENT:   Negative for hearing loss and voice change.   Eyes:   Negative for eye problems.  Respiratory:  Negative for chest tightness and cough.   Cardiovascular:  Negative for chest pain.  Gastrointestinal:  Negative for abdominal distention, abdominal pain and blood in stool.  Endocrine: Negative for hot flashes.  Genitourinary:  Negative for difficulty urinating and frequency.   Musculoskeletal:  Negative for arthralgias.  Skin:  Negative for itching and rash.  Neurological:  Negative for extremity weakness.  Hematological:  Negative for adenopathy.  Psychiatric/Behavioral:  Negative for confusion.    MEDICAL HISTORY:  Past Medical History:  Diagnosis Date   B12 deficiency anemia 01/03/2021   Chest pain, unspecified    CHF (congestive heart failure) ([place])    Diabetes mellitus without complication (HCC)    Hip pain    Hypertension    Multinodular goiter     SURGICAL HISTORY: Past Surgical History:  Procedure Laterality Date   COLONOSCOPY WITH PROPOFOL N/A 11/24/2015   Procedure: COLONOSCOPY WITH PROPOFOL;  Surgeon: Lollie Sails, MD;  Location: [facility] ENDOSCOPY;  Service: Endoscopy;  Laterality: N/A;   COLONOSCOPY WITH PROPOFOL N/A 03/18/2019   Procedure: COLONOSCOPY WITH PROPOFOL;  Surgeon: Virgel Manifold, MD;  Location: [facility] ENDOSCOPY;  Service: Endoscopy;  Laterality: N/A;   DILATION AND CURETTAGE OF UTERUS N/A 12/01/2020   Procedure: DILATATION AND CURETTAGE with cervical biopsies;  Surgeon: Schermerhorn, Gwen Her, MD;  Location: [facility] ORS;  Service: Gynecology;  Laterality: N/A;   ESOPHAGOGASTRODUODENOSCOPY (EGD) WITH PROPOFOL N/A 03/18/2019   Procedure: ESOPHAGOGASTRODUODENOSCOPY (EGD) WITH PROPOFOL;  Surgeon: Virgel Manifold, MD;  Location: [facility] ENDOSCOPY;  Service: Endoscopy;  Laterality: N/A;   PORTA CATH INSERTION N/A 01/16/2021   Procedure: PORTA CATH INSERTION;  Surgeon: Katha Cabal, MD;  Location: [facility];  Service: Cardiovascular;  Laterality: N/A;   right ankle orif      SOCIAL HISTORY: Social  History   Socioeconomic History   Marital status: Legally Separated    Spouse name: Not on file   Number of children: Not on file   Years of education: Not on file   Highest education level: Not on file  Occupational History   Not on file  Tobacco Use   Smoking status: Every Day    Packs/day: 0.10    Types: Cigarettes   Smokeless tobacco: Never  Substance and Sexual Activity   Alcohol use: Not Currently   Drug use: Not on file   Sexual activity: Not Currently  Other Topics Concern   Not on file  Social History Narrative   Not on file   Social Determinants of Health   Financial Resource Strain: Not on file  Food Insecurity: Not on file  Transportation Needs: Not on file  Physical Activity: Not on file  Stress: Not on file  Social Connections: Not on file  Intimate Partner Violence: Not on file    FAMILY HISTORY: Family History  Problem Relation Age of Onset   Breast cancer Maternal Aunt    Breast cancer Paternal Aunt     ALLERGIES:  is allergic to ace inhibitors and cyclobenzaprine.  MEDICATIONS:  Current Outpatient Medications  Medication Sig Dispense Refill   aspirin EC 81 MG tablet Take 81 mg by mouth daily.     Calcium Carbonate-Vitamin D 500-125  MG-UNIT TABS Take 1 tablet by mouth daily.     docusate sodium (COLACE) 100 MG capsule Take 100 mg by mouth daily.     Ferrous Sulfate (IRON) 325 (65 Fe) MG TABS Take 1 tablet by mouth 2 (two) times daily.     furosemide (LASIX) 20 MG tablet Take 20 mg by mouth 2 (two) times daily.     gabapentin (NEURONTIN) 800 MG tablet Take 800 mg by mouth 2 (two) times daily.     hydrOXYzine (ATARAX/VISTARIL) 25 MG tablet Take 25 mg by mouth daily. Take 25mg . By mouth every morning     lidocaine-prilocaine (EMLA) cream Apply 1 application topically as needed.     losartan (COZAAR) 50 MG tablet Take 50 mg by mouth 2 (two) times daily.     metFORMIN (GLUCOPHAGE) 500 MG tablet Take 500 mg by mouth 2 (two) times daily with a meal.      mirtazapine (REMERON) 15 MG tablet Take 15 mg by mouth at bedtime.     ondansetron (ZOFRAN) 4 MG tablet Take 1 tablet (4 mg total) by mouth every 6 (six) hours as needed for nausea. 20 tablet 0   oxyCODONE (OXY IR/ROXICODONE) 5 MG immediate release tablet Take 1 tablet (5 mg total) by mouth every 8 (eight) hours as needed for moderate pain. 15 tablet 0   simvastatin (ZOCOR) 20 MG tablet Take 20 mg by mouth daily.     No current facility-administered medications for this visit.     PHYSICAL EXAMINATION: ECOG PERFORMANCE STATUS: 2 - Symptomatic, <50% confined to bed Vitals:   01/17/21 0928  BP: (!) 136/96  Pulse: 80  Resp: 17  Temp: 99 F (37.2 C)  SpO2: 100%   Filed Weights   01/17/21 0928  Weight: 149 lb (67.6 kg)    Physical Exam Constitutional:      General: She is not in acute distress.    Comments: She is able to ambulate with a walker  HENT:     Head: Normocephalic and atraumatic.  Eyes:     General: No scleral icterus. Cardiovascular:     Rate and Rhythm: Normal rate and regular rhythm.     Heart sounds: Normal heart sounds.  Pulmonary:     Effort: Pulmonary effort is normal. No respiratory distress.     Breath sounds: No wheezing.  Abdominal:     General: Bowel sounds are normal. There is no distension.     Palpations: Abdomen is soft.  Musculoskeletal:        General: No deformity. Normal range of motion.     Cervical back: Normal range of motion and neck supple.  Skin:    General: Skin is warm and dry.     Findings: No erythema or rash.  Neurological:     Mental Status: She is alert and oriented to person, place, and time. Mental status is at baseline.     Cranial Nerves: No cranial nerve deficit.     Coordination: Coordination normal.  Psychiatric:        Mood and Affect: Mood normal.    LABORATORY DATA:  I have reviewed the data as listed Lab Results  Component Value Date   WBC 6.0 01/17/2021   HGB 9.9 (L) 01/17/2021   HCT 29.9 (L)  01/17/2021   MCV 109.9 (H) 01/17/2021   PLT 317 01/17/2021   Recent Labs    01/03/21 0807 01/10/21 0810 01/15/21 1435 01/17/21 0850  NA 137 141 139 141  K 3.9 4.2  3.6 4.0  CL 105 109 104 106  CO2 26 27 26 27   GLUCOSE 82 85 89 82  BUN 11 9 8 9   CREATININE 1.33* 0.91 0.77 1.01*  CALCIUM 8.6* 8.4* 8.5* 8.8*  GFRNONAA 43* >60 >60 59*  PROT 7.6 6.5  --  6.8  ALBUMIN 2.9* 2.8*  --  3.0*  AST 19 22  --  16  ALT 12 14  --  11  ALKPHOS 64 56  --  56  BILITOT 0.1* 0.7  --  0.5    Iron/TIBC/Ferritin/ %Sat    Component Value Date/Time   IRON 85 12/26/2020 0846   IRON 90 02/24/2019 1550   TIBC 204 (L) 12/26/2020 0846   TIBC 292 02/24/2019 1550   FERRITIN 25 12/26/2020 0846   FERRITIN 33 02/24/2019 1550   IRONPCTSAT 42 (H) 12/26/2020 0846   IRONPCTSAT 31 02/24/2019 1550       RADIOGRAPHIC STUDIES: I have personally reviewed the radiological images as listed and agreed with the findings in the report. PERIPHERAL VASCULAR CATHETERIZATION  Result Date: 01/16/2021 See surgical note for result.  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 70 year old female. EXAM: NUCLEAR MEDICINE PET SKULL BASE TO THIGH TECHNIQUE: 8.1 mCi F-18 FDG was injected intravenously. Full-ring PET imaging was performed from the skull base to thigh after the radiotracer. CT data was obtained and used for attenuation correction and anatomic localization. Fasting blood glucose: 76 mg/dl COMPARISON:  CT abdomen and pelvis of August of 2022. FINDINGS: Mediastinal blood pool activity: SUV max 2.1 Liver activity: SUV max NA NECK: Asymmetric fullness of RIGHT glossotonsillar sulcus and activity associated with this area on FDG PET maximum SUV 7.6 (image 38/3) contralateral activity 5.0 no discrete adenopathy in the neck. Asymmetric thyroid activity with nodule in the RIGHT thyroid showing maximum  SUV of 5.9 (image 58/3) nodule measuring up to 2.8 cm. Incidental CT findings: none CHEST: RIGHT thyroid abnormality as described. Some nodularity of the LEFT thyroid. No signs of hypermetabolic lymph nodes or suspicious pulmonary nodules in the chest. Lymph nodes with fatty hila in the LEFT axilla maximum SUV of 2.5 Incidental CT findings: Aortic atherosclerosis. Three-vessel coronary artery disease. No consolidation or evidence of pleural effusion. ABDOMEN/PELVIS: Diffuse activity throughout the uterus and cervix with a maximum SUV of 16.3. (Image 221/3) cervix is bulky measuring 6.3 x 4.0 cm at this level. Activity extends throughout the entire uterus. Multi cystic RIGHT adnexal lesion shows increased metabolic activity at the lateral margin with a maximum SUV of 6.7 (image 204/3) 6.2 x 4.5 cm. Small LEFT external iliac lymph nodes and common iliac lymph nodes are noted, most hypermetabolic with a maximum SUV of 4.1 measuring 7 mm (image 181/3 Incidental CT findings: No acute findings relative to liver, gallbladder, pancreas, spleen, adrenal glands, kidneys, stomach, small or large bowel. No pericecal stranding. Appendix not visualized. Colonic diverticulosis. SKELETON: No focal hypermetabolic activity to suggest skeletal metastasis. Incidental CT findings: Spinal degenerative changes. IMPRESSION: Signs of cervical cancer with diffuse involvement of the uterus. Cystic and solid RIGHT ovarian lesion more likely related to diffuse cervical cancer. Concomitant synchronous cystic ovarian neoplasm could have this appearance but is felt less likely given the diffuse nature of disease throughout the uterus and cervix. Small lymph nodes in the pelvis in the LEFT pelvis suspicious for nodal involvement at the common iliac level. No solid organ or  distant metastasis. RIGHT lingual tonsil and glossotonsillar sulcus uptake which is asymmetric and with increased fullness. While this may be physiologic would suggest direct  visualization for further evaluation to exclude neoplasm in this area in the head and neck. RIGHT thyroid uptake with visible nodule. Recommend thyroid US and biopsy (ref: J Am Coll Radiol. 2015 Feb;12(2): 143-50). Aortic Atherosclerosis (ICD10-I70.0). Electronically Signed   By: Zetta Bills M.D.   On: 12/28/2020 18:06      ASSESSMENT & PLAN:  1. Encounter for antineoplastic chemotherapy   2. 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HCC)   3. Other vitamin B12 deficiency anemia   4. Abnormal positron emission tomography (PET) scan   5. Port-A-Cath in place    #Locally advanced cervical cancer.  Stage III Not a surgical candidate. Currently on concurrent chemotherapy and radiation.  01/03/2021 cisplatin 20 mg/m2 today.  Dose were reduced to 50% due to her impaired kidney function.  Treatment plan was switched to carboplatin given her decreased kidney function 01/10/2021 carboplatin. Kidney function improved.  Labs are reviewed and discussed with patient. Proceed with Cisplatin 30mg /m2 today.  - Crcl 54  Difficulty of obtaining IV access, s/p medi port placement.   #Macrocytic anemia,  Today at 9.9 macrocytic. Continue vitamin B12 injection.  #Abnormal PET scan with tonsil uptake.  Refer to ENT.  Plans were discussed with daughter Baker Janus over the phone.  She agrees with the treatment plan.   All questions were answered. The patient knows to call the clinic with any problems questions or concerns.  cc Center, [place]    Return of visit: 1 week, lab MD cisplatin + Vitamin B12   Earlie Server, MD, PhD Hematology Oncology Early at [facility]  01/17/2021

## 2021-01-17 NOTE — Progress Notes (Signed)
OFF PATHWAY REGIMEN - Other  No Change  Continue With Treatment as Ordered.  Original Decision Date/Time: 01/15/2021 20:43   [id number]:Cisplatin 40 mg/m2 IV D1 q7 Days + RT:   A cycle is every 7 days:     Cisplatin   **Always confirm dose/schedule in your pharmacy ordering system**  Patient Characteristics: Intent of Therapy: Curative Intent, Discussed with Patient

## 2021-01-17 NOTE — Patient Instructions (Signed)
CANCER CENTER [place] [facility]  Discharge Instructions: Thank you for choosing [facility] to provide your oncology and hematology care.  If you have a lab appointment with the Cancer Center, please go directly to the Cancer Center and check in at the registration area.  Wear comfortable clothing and clothing appropriate for easy access to any Portacath or PICC line.   We strive to give you quality time with your provider. You may need to reschedule your appointment if you arrive late (15 or more minutes).  Arriving late affects you and other patients whose appointments are after yours.  Also, if you miss three or more appointments without notifying the office, you may be dismissed from the clinic at the provider's discretion.      For prescription refill requests, have your pharmacy contact our office and allow 72 hours for refills to be completed.    Today you received the following chemotherapy and/or immunotherapy agents : Cisplatin    To help prevent nausea and vomiting after your treatment, we encourage you to take your nausea medication as directed.  BELOW ARE SYMPTOMS THAT SHOULD BE REPORTED IMMEDIATELY: *FEVER GREATER THAN 100.4 F (38 C) OR HIGHER *CHILLS OR SWEATING *NAUSEA AND VOMITING THAT IS NOT CONTROLLED WITH YOUR NAUSEA MEDICATION *UNUSUAL SHORTNESS OF BREATH *UNUSUAL BRUISING OR BLEEDING *URINARY PROBLEMS (pain or burning when urinating, or frequent urination) *BOWEL PROBLEMS (unusual diarrhea, constipation, pain near the anus) TENDERNESS IN MOUTH AND THROAT WITH OR WITHOUT PRESENCE OF ULCERS (sore throat, sores in mouth, or a toothache) UNUSUAL RASH, SWELLING OR PAIN  UNUSUAL VAGINAL DISCHARGE OR ITCHING   Items with * indicate a potential emergency and should be followed up as soon as possible or go to the Emergency Department if any problems should occur.  Please show the CHEMOTHERAPY ALERT CARD or IMMUNOTHERAPY ALERT CARD at check-in  to the Emergency Department and triage nurse.  Should you have questions after your visit or need to cancel or reschedule your appointment, please contact CANCER CENTER [place] [facility]  [phone] and follow the prompts.  Office hours are 8:00 a.m. to 4:30 p.m. Monday - Friday. Please note that voicemails left after 4:00 p.m. may not be returned until the following business day.  We are closed weekends and major holidays. You have access to a nurse at all times for urgent questions. Please call the main number to the clinic [phone] and follow the prompts.  For any non-urgent questions, you may also contact your provider using MyChart. We now offer e-Visits for anyone 18 and older to request care online for non-urgent symptoms. For details visit [email or web address].   Also download the MyChart app! Go to the app store, search "MyChart", open the app, select [facility], and log in with your MyChart username and password.  Due to Covid, a mask is required upon entering the hospital/clinic. If you do not have a mask, one will be given to you upon arrival. For doctor visits, patients may have 1 support person aged 18 or older with them. For treatment visits, patients cannot have anyone with them due to current Covid guidelines and our immunocompromised population.  

## 2021-01-18 ENCOUNTER — Ambulatory Visit
Admission: RE | Admit: 2021-01-18 | Discharge: 2021-01-18 | Disposition: A | Discharge status: Home / Self Care | Payer: Medicare Other | Source: Ambulatory Visit | Attending: Radiation Oncology | Admitting: Radiation Oncology

## 2021-01-18 ENCOUNTER — Inpatient Hospital Stay: Payer: Medicare Other

## 2021-01-18 DIAGNOSIS — C539 Malignant neoplasm of cervix uteri, unspecified: Secondary | ICD-10-CM | POA: Diagnosis not present

## 2021-01-19 ENCOUNTER — Inpatient Hospital Stay: Payer: Medicare Other

## 2021-01-19 ENCOUNTER — Ambulatory Visit
Admission: RE | Admit: 2021-01-19 | Discharge: 2021-01-19 | Disposition: A | Discharge status: Home / Self Care | Payer: Medicare Other | Source: Ambulatory Visit | Attending: Radiation Oncology | Admitting: Radiation Oncology

## 2021-01-19 DIAGNOSIS — C539 Malignant neoplasm of cervix uteri, unspecified: Secondary | ICD-10-CM | POA: Diagnosis not present

## 2021-01-22 ENCOUNTER — Inpatient Hospital Stay: Payer: Medicare Other

## 2021-01-22 ENCOUNTER — Ambulatory Visit
Admission: RE | Admit: 2021-01-22 | Discharge: 2021-01-22 | Disposition: A | Discharge status: Home / Self Care | Payer: Medicare Other | Source: Ambulatory Visit | Attending: Radiation Oncology | Admitting: Radiation Oncology

## 2021-01-22 DIAGNOSIS — C539 Malignant neoplasm of cervix uteri, unspecified: Secondary | ICD-10-CM | POA: Diagnosis not present

## 2021-01-23 ENCOUNTER — Ambulatory Visit
Admission: RE | Admit: 2021-01-23 | Discharge: 2021-01-23 | Disposition: A | Discharge status: Home / Self Care | Payer: Medicare Other | Source: Ambulatory Visit | Attending: Radiation Oncology | Admitting: Radiation Oncology

## 2021-01-23 ENCOUNTER — Encounter: Payer: Self-pay | Admitting: Oncology

## 2021-01-23 ENCOUNTER — Inpatient Hospital Stay: Payer: Medicare Other

## 2021-01-23 DIAGNOSIS — C539 Malignant neoplasm of cervix uteri, unspecified: Secondary | ICD-10-CM | POA: Diagnosis not present

## 2021-01-24 ENCOUNTER — Inpatient Hospital Stay: Payer: Medicare Other

## 2021-01-24 ENCOUNTER — Encounter: Payer: Self-pay | Admitting: Oncology

## 2021-01-24 ENCOUNTER — Inpatient Hospital Stay (HOSPITAL_BASED_OUTPATIENT_CLINIC_OR_DEPARTMENT_OTHER): Payer: Medicare Other | Admitting: Oncology

## 2021-01-24 ENCOUNTER — Ambulatory Visit
Admission: RE | Admit: 2021-01-24 | Discharge: 2021-01-24 | Disposition: A | Discharge status: Home / Self Care | Payer: Medicare Other | Source: Ambulatory Visit | Attending: Radiation Oncology | Admitting: Radiation Oncology

## 2021-01-24 ENCOUNTER — Other Ambulatory Visit: Payer: Self-pay

## 2021-01-24 VITALS — BP 150/71

## 2021-01-24 VITALS — BP 145/105 | HR 85 | Temp 98.7°F | Resp 18 | Wt 143.0 lb

## 2021-01-24 DIAGNOSIS — D649 Anemia, unspecified: Secondary | ICD-10-CM

## 2021-01-24 DIAGNOSIS — R948 Abnormal results of function studies of other organs and systems: Secondary | ICD-10-CM

## 2021-01-24 DIAGNOSIS — D518 Other vitamin B12 deficiency anemias: Secondary | ICD-10-CM

## 2021-01-24 DIAGNOSIS — C539 Malignant neoplasm of cervix uteri, unspecified: Secondary | ICD-10-CM | POA: Diagnosis not present

## 2021-01-24 DIAGNOSIS — Z5111 Encounter for antineoplastic chemotherapy: Secondary | ICD-10-CM

## 2021-01-24 DIAGNOSIS — N1832 Chronic kidney disease, stage 3b: Secondary | ICD-10-CM

## 2021-01-24 LAB — COMPREHENSIVE METABOLIC PANEL
ALT: 9 U/L (ref 0–44)
AST: 16 U/L (ref 15–41)
Albumin: 3 g/dL — ABNORMAL LOW (ref 3.5–5.0)
Alkaline Phosphatase: 51 U/L (ref 38–126)
Anion gap: 5 (ref 5–15)
BUN: 6 mg/dL — ABNORMAL LOW (ref 8–23)
CO2: 27 mmol/L (ref 22–32)
Calcium: 8.4 mg/dL — ABNORMAL LOW (ref 8.9–10.3)
Chloride: 107 mmol/L (ref 98–111)
Creatinine, Ser: 0.87 mg/dL (ref 0.44–1.00)
GFR, Estimated: >60 mL/min (ref >60)
Glucose, Bld: 95 mg/dL (ref 70–99)
Potassium: 3.7 mmol/L (ref 3.5–5.1)
Sodium: 139 mmol/L (ref 135–145)
Total Bilirubin: 0.4 mg/dL (ref 0.3–1.2)
Total Protein: 6.5 g/dL (ref 6.5–8.1)

## 2021-01-24 LAB — CBC WITH DIFFERENTIAL/PLATELET
Abs Immature Granulocytes: 0.03 10*3/uL (ref 0.00–0.07)
Basophils Absolute: 0 10*3/uL (ref 0.0–0.1)
Basophils Relative: 0 %
Eosinophils Absolute: 0.8 10*3/uL — ABNORMAL HIGH (ref 0.0–0.5)
Eosinophils Relative: 14 %
HCT: 27.4 % — ABNORMAL LOW (ref 36.0–46.0)
Hemoglobin: 9 g/dL — ABNORMAL LOW (ref 12.0–15.0)
Immature Granulocytes: 1 %
Lymphocytes Relative: 13 %
Lymphs Abs: 0.7 10*3/uL (ref 0.7–4.0)
MCH: 36 pg — ABNORMAL HIGH (ref 26.0–34.0)
MCHC: 32.8 g/dL (ref 30.0–36.0)
MCV: 109.6 fL — ABNORMAL HIGH (ref 80.0–100.0)
Monocytes Absolute: 0.5 10*3/uL (ref 0.1–1.0)
Monocytes Relative: 9 %
Neutro Abs: 3.4 10*3/uL (ref 1.7–7.7)
Neutrophils Relative %: 63 %
Platelets: 296 10*3/uL (ref 150–400)
RBC: 2.5 MIL/uL — ABNORMAL LOW (ref 3.87–5.11)
RDW: 15.8 % — ABNORMAL HIGH (ref 11.5–15.5)
WBC: 5.5 10*3/uL (ref 4.0–10.5)
nRBC: 0 % (ref 0.0–0.2)

## 2021-01-24 MED ORDER — POTASSIUM CHLORIDE IN NACL 20-0.9 MEQ/L-% IV SOLN
Freq: Once | INTRAVENOUS | Status: AC
  Filled 2021-01-24: qty 1000

## 2021-01-24 MED ORDER — MAGNESIUM SULFATE 2 GM/50ML IV SOLN
2.0000 g | Freq: Once | INTRAVENOUS | Status: AC
  Administered 2021-01-24: 2 g via INTRAVENOUS
  Filled 2021-01-24: qty 50

## 2021-01-24 MED ORDER — SODIUM CHLORIDE 0.9 % IV SOLN
150.0000 mg | Freq: Once | INTRAVENOUS | Status: AC
  Administered 2021-01-24: 150 mg via INTRAVENOUS
  Filled 2021-01-24: qty 150

## 2021-01-24 MED ORDER — PALONOSETRON HCL INJECTION 0.25 MG/5ML
0.2500 mg | Freq: Once | INTRAVENOUS | Status: AC
  Administered 2021-01-24: 0.25 mg via INTRAVENOUS
  Filled 2021-01-24: qty 5

## 2021-01-24 MED ORDER — CYANOCOBALAMIN 1000 MCG/ML IJ SOLN
1000.0000 ug | Freq: Once | INTRAMUSCULAR | Status: AC
  Administered 2021-01-24: 1000 ug via INTRAMUSCULAR
  Filled 2021-01-24: qty 1

## 2021-01-24 MED ORDER — HEPARIN SOD (PORK) LOCK FLUSH 100 UNIT/ML IV SOLN
INTRAVENOUS | Status: AC
  Filled 2021-01-24: qty 5

## 2021-01-24 MED ORDER — SODIUM CHLORIDE 0.9 % IV SOLN
Freq: Once | INTRAVENOUS | Status: AC
  Filled 2021-01-24: qty 250

## 2021-01-24 MED ORDER — SODIUM CHLORIDE 0.9% FLUSH
10.0000 mL | INTRAVENOUS | Status: DC | PRN
  Filled 2021-01-24: qty 10

## 2021-01-24 MED ORDER — SODIUM CHLORIDE 0.9 % IV SOLN
10.0000 mg | Freq: Once | INTRAVENOUS | Status: AC
  Administered 2021-01-24: 10 mg via INTRAVENOUS
  Filled 2021-01-24: qty 10

## 2021-01-24 MED ORDER — HEPARIN SOD (PORK) LOCK FLUSH 100 UNIT/ML IV SOLN
500.0000 [IU] | Freq: Once | INTRAVENOUS | Status: AC | PRN
  Administered 2021-01-24: 500 [IU]
  Filled 2021-01-24: qty 5

## 2021-01-24 MED ORDER — SODIUM CHLORIDE 0.9 % IV SOLN
30.0000 mg/m2 | Freq: Once | INTRAVENOUS | Status: AC
  Administered 2021-01-24: 51 mg via INTRAVENOUS
  Filled 2021-01-24: qty 51

## 2021-01-24 NOTE — Telephone Encounter (Signed)
PDL-1 report scanned in Media.

## 2021-01-24 NOTE — Progress Notes (Signed)
Pt was very irritable and upset that she was scheduled for chemo today.  She repeats many times that "people keep lying to her".  She questions this RN if the doctors from [place] are actually from [facility] or if we are making that up as well.  Several different nurses reminded the patient that she could elect not to be treated today.  This RN suggested that the patient take her concerns up with the doctor to which she responded "you know how those foreign doctors are, they cannot speak about anything".  Pt was also angry about her [place] Popper driver that reportedly showed up at her house but according to her, he never did and he was lying.  She drove herself to her appointment today.  Pt was upset with her appointments and having to see the doctor as well as her daughter and her son.  She was very vocal about her numerous complaints today.  Pt had to be asked to quiet down because she was talking loudly to herself and cursing and disturbing other patients.    Pt tolerated all infusions well today.  B12 injection was administered to right arm per Dr. Tasia Catchings, site benign.  Pt left infusion suite stable in a wheelchair.

## 2021-01-24 NOTE — Progress Notes (Signed)
Hematology/Oncology progress note [facility] Telephone:([phone] [phone]   Patient Care Team: Center, [facility] as PCP - General (General Practice) Clent Jacks, RN as Oncology Nurse Navigator Earlie Server, MD as Consulting Physician (Oncology) Earlie Server, MD as Consulting Physician (Hematology and Oncology)  REFERRING PROVIDER: Center, [place] VISIT:  Follow up for treatment of cervical cancer  HISTORY OF PRESENTING ILLNESS:   Rebecca Parker is a  70 y.o.  female with PMH listed below was seen in consultation at the request of  Center, Jenny Reichmann*  for evaluation of cervical cancer   12/01/2020-8/9/022 She was admitted from the ED for 1 day h/o right lower abdominal / pelvic pain . 12/01/2020 CT abdomen pelvis w contrast showed  thickly septated right ovarian mass measuring 5.1 x 5.1 cm. 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 Recommend initial pelvic ultrasound and likely subsequent MRI to further evaluate, which may be performed on a nonemergent basis. 2. Air within the fundal endometrial cavity. Correlate for recent instrumentation. 3. Pancolonic diverticulosis without evidence of acute diverticulitis.  12/01/2020 Fx D+C  and cervical bx. Uterus sounded to 9 cm.   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   Diastolic CHF and Diabetes.  UDS + for cocaine.   She is the oldest of 7 kids (5 girls and 2 boys). She has 3 children - all SVDs - 2 boys and one girl.  She was seen by GynOnc Dr.Secord.  Her pelvic examination showed On palpations suspect lateral vaginal disease >50% down  the vault and disease involving the left fornix. Cervix enlarged >4 cm with grossly obvious tumor and hard to palpation. Uterus - may be enlarged with firm mass on the right aspect versus palpation of the right adnexal mass. Positive parametrial involvement on the right and on the left with disease to or almost to the sidewall on the left. Rectovaginal exam was confirmatory.    # 8/31 2022, PET scan showed signs of cervical cancer with diffuse involvement of the uterus.  Cystic and solid right ovarian lesion more likely related to diffuse cervical cancer.  Concomitant synchronous cystic ovarian neoplasm could have appearance but feel less likely given the diffuse nature of disease throughout the uterus and cervix.  Small lymph nodes in the pelvis in the left pelvis suspicious for nodal involvement at the common iliac level.  No solid organ distant metastasis. Right lung renal consult and glossotonsillar sulcus uptake which is asymmetric and with increased fullness.  While this may be physiologic, will suggest direct visualization for further evaluation to exclude neoplasm. Right thyroid uptake with visible nodule.  Recommend ultrasound thyroid and biopsy.  Patient has had baseline audiogram done and chemotherapy class. 01/03/2021 cisplatin 20 mg/m2 today.  Dose were reduced to 50% due to her impaired kidney function.  Treatment plan was switched to carboplatin given her decreased kidney function 01/10/2021 carboplatin. Kidney function improved. 01/17/2021 cisplatin.   PD-L1 CPS 20%  INTERVAL HISTORY Rebecca Parker is a 70 y.o. female who has above history reviewed by me today presents for follow up visit for treatment of cervix cancer. Problems and complaints are listed below: She is on  concurrent chemo-RT Patient feels upset because her transportation did not work out for her. She says " they don't wait for me". She has to drive herself in.  Pre-existing neuropathy of her lower extremities are worse.   She is a poor historian. She is here by herself.   Review of Systems  Constitutional:  Positive for fatigue. Negative for appetite change, chills and fever.  HENT:   Negative for hearing loss and voice change.   Eyes:  Negative for eye problems.  Respiratory:  Negative for chest tightness and cough.   Cardiovascular:  Negative for chest pain.  Gastrointestinal:  Negative for abdominal distention, abdominal pain and blood in stool.  Endocrine: Negative for hot flashes.  Genitourinary:  Negative for difficulty urinating and frequency.   Musculoskeletal:  Negative for arthralgias.  Skin:  Negative for itching and rash.  Neurological:  Positive for numbness. Negative for extremity weakness.  Hematological:  Negative for adenopathy.  Psychiatric/Behavioral:  Negative for confusion.    MEDICAL HISTORY:  Past Medical History:  Diagnosis Date   B12 deficiency anemia 01/03/2021   Chest pain, unspecified    CHF (congestive heart failure) ([place])    Diabetes mellitus without complication (HCC)    Hip pain    Hypertension    Multinodular goiter     SURGICAL HISTORY: Past Surgical History:  Procedure Laterality Date   COLONOSCOPY WITH PROPOFOL N/A 11/24/2015   Procedure: COLONOSCOPY WITH PROPOFOL;  Surgeon: Lollie Sails, MD;  Location: [facility] ENDOSCOPY;  Service: Endoscopy;  Laterality: N/A;   COLONOSCOPY WITH PROPOFOL N/A 03/18/2019   Procedure: COLONOSCOPY WITH PROPOFOL;  Surgeon: Virgel Manifold, MD;  Location: [facility] ENDOSCOPY;  Service: Endoscopy;  Laterality: N/A;   DILATION AND CURETTAGE OF UTERUS N/A 12/01/2020   Procedure: DILATATION AND CURETTAGE with cervical biopsies;  Surgeon: Schermerhorn, Gwen Her, MD;  Location: [facility] ORS;  Service: Gynecology;  Laterality: N/A;   ESOPHAGOGASTRODUODENOSCOPY (EGD) WITH PROPOFOL N/A 03/18/2019   Procedure: ESOPHAGOGASTRODUODENOSCOPY (EGD) WITH PROPOFOL;  Surgeon: Virgel Manifold, MD;  Location: [facility] ENDOSCOPY;  Service: Endoscopy;   Laterality: N/A;   PORTA CATH INSERTION N/A 01/16/2021   Procedure: PORTA CATH INSERTION;  Surgeon: Katha Cabal, MD;  Location: [place] [facility] LAB;  Service: Cardiovascular;  Laterality: N/A;   right ankle orif      SOCIAL HISTORY: Social History   Socioeconomic History   Marital status: Legally Separated    Spouse name: Not on file   Number of children: Not on file   Years of education: Not on file   Highest education level: Not on file  Occupational History   Not on file  Tobacco Use   Smoking status: Every Day    Packs/day: 0.10    Types: Cigarettes   Smokeless tobacco: Never  Substance and Sexual Activity   Alcohol use: Not Currently   Drug use: Not on file   Sexual activity: Not Currently  Other Topics Concern   Not on file  Social History Narrative   Not on file   Social Determinants of Health   Financial Resource Strain: Not on file  Food Insecurity: Not on file  Transportation Needs: Not on file  Physical Activity: Not on file  Stress: Not on file  Social Connections: Not on file  Intimate Partner Violence: Not on file    FAMILY HISTORY: Family History  Problem Relation Age of Onset   Breast cancer Maternal Aunt    Breast cancer Paternal Aunt     ALLERGIES:  is allergic to ace inhibitors and cyclobenzaprine.  MEDICATIONS:  Current Outpatient Medications  Medication Sig Dispense Refill   aspirin EC 81 MG tablet Take 81 mg by mouth daily.     Calcium Carbonate-Vitamin D 500-125 MG-UNIT TABS Take 1 tablet by mouth daily.     docusate sodium (COLACE) 100 MG capsule Take 100 mg by mouth daily.     Ferrous Sulfate (IRON) 325 (65 Fe) MG TABS Take 1 tablet by mouth 2 (two) times daily.     furosemide (LASIX) 20 MG tablet Take 20 mg by mouth 2 (two) times daily.     gabapentin (NEURONTIN) 800 MG tablet Take 800 mg by mouth 2 (two) times daily.     hydrOXYzine (ATARAX/VISTARIL) 25 MG tablet Take 25 mg by mouth daily. Take 76m. By mouth every morning      lidocaine-prilocaine (EMLA) cream Apply 1 application topically as needed.     losartan (COZAAR) 50 MG tablet Take 50 mg by mouth 2 (two) times daily.     metFORMIN (GLUCOPHAGE) 500 MG tablet Take 500 mg by mouth 2 (two) times daily with a meal.     mirtazapine (REMERON) 15 MG tablet Take 15 mg by mouth at bedtime.     ondansetron (ZOFRAN) 4 MG tablet Take 1 tablet (4 mg total) by mouth every 6 (six) hours as needed for nausea. 20 tablet 0   simvastatin (ZOCOR) 20 MG tablet Take 20 mg by mouth daily.     oxyCODONE (OXY IR/ROXICODONE) 5 MG immediate release tablet Take 1 tablet (5 mg total) by mouth every 8 (eight) hours as needed for moderate pain. (Patient not taking: Reported on 01/24/2021) 15 tablet 0   No current facility-administered medications for this visit.   Facility-Administered Medications Ordered in Other Visits  Medication Dose Route Frequency Provider Last Rate Last Admin   heparin lock flush 100 UNIT/ML injection            heparin lock flush 100 unit/mL  500 Units Intracatheter Once PRN YEarlie Server MD       sodium chloride flush (NS) 0.9 % injection 10 mL  10 mL Intracatheter PRN YEarlie Server MD         PHYSICAL EXAMINATION: ECOG PERFORMANCE STATUS: 2 - Symptomatic, <50% confined to bed Vitals:   01/24/21 0928  BP: (!) 145/105  Pulse: 85  Resp: 18  Temp: 98.7 F (37.1 C)  SpO2: 100%   Filed Weights   01/24/21 0928  Weight: 143 lb (64.9 kg)    Physical Exam Constitutional:      General: She is not in acute distress.    Comments: She is able to ambulate with a walker  HENT:     Head: Normocephalic and atraumatic.  Eyes:     General: No scleral icterus. Cardiovascular:     Rate and Rhythm: Normal rate and regular rhythm.     Heart sounds: Normal heart sounds.  Pulmonary:     Effort: Pulmonary effort is normal. No respiratory distress.     Breath sounds: No wheezing.  Abdominal:     General: Bowel sounds are normal. There is no distension.     Palpations:  Abdomen is soft.  Musculoskeletal:        General: No deformity. Normal range of motion.     Cervical back: Normal range of motion and neck supple.  Skin:    General: Skin is warm and dry.     Findings: No erythema or rash.  Neurological:  Mental Status: She is alert and oriented to person, place, and time. Mental status is at baseline.     Cranial Nerves: No cranial nerve deficit.     Coordination: Coordination normal.  Psychiatric:        Mood and Affect: Mood normal.    LABORATORY DATA:  I have reviewed the data as listed Lab Results  Component Value Date   WBC 5.5 01/24/2021   HGB 9.0 (L) 01/24/2021   HCT 27.4 (L) 01/24/2021   MCV 109.6 (H) 01/24/2021   PLT 296 01/24/2021   Recent Labs    01/10/21 0810 01/15/21 1435 01/17/21 0850 01/24/21 0832  NA 141 139 141 139  K 4.2 3.6 4.0 3.7  CL 109 104 106 107  CO2 _0 GLUCOSE 85 89 82 95  BUN _1 6*  CREATININE 0.91 0.77 1.01* 0.87  CALCIUM 8.4* 8.5* 8.8* 8.4*  GFRNONAA >60 >60 59* >60  PROT 6.5  --  6.8 6.5  ALBUMIN 2.8*  --  3.0* 3.0*  AST 22  --  16 16  ALT 14  --  11 9  ALKPHOS 56  --  56 51  BILITOT 0.7  --  0.5 0.4    Iron/TIBC/Ferritin/ %Sat    Component Value Date/Time   IRON 85 12/26/2020 0846   IRON 90 02/24/2019 1550   TIBC 204 (L) 12/26/2020 0846   TIBC 292 02/24/2019 1550   FERRITIN 25 12/26/2020 0846   FERRITIN 33 02/24/2019 1550   IRONPCTSAT 42 (H) 12/26/2020 0846   IRONPCTSAT 31 02/24/2019 1550       RADIOGRAPHIC STUDIES: I have personally reviewed the radiological images as listed and agreed with the findings in the report. PERIPHERAL VASCULAR CATHETERIZATION  Result Date: 01/16/2021 See surgical note for result.  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 70 year old female. EXAM: NUCLEAR MEDICINE PET SKULL BASE TO THIGH TECHNIQUE: 8.1 mCi F-18 FDG  was injected intravenously. Full-ring PET imaging was performed from the skull base to thigh after the radiotracer. CT data was obtained and used for attenuation correction and anatomic localization. Fasting blood glucose: 76 mg/dl COMPARISON:  CT abdomen and pelvis of August of 2022. FINDINGS: Mediastinal blood pool activity: SUV max 2.1 Liver activity: SUV max NA NECK: Asymmetric fullness of RIGHT glossotonsillar sulcus and activity associated with this area on FDG PET maximum SUV 7.6 (image 38/3) contralateral activity 5.0 no discrete adenopathy in the neck. Asymmetric thyroid activity with nodule in the RIGHT thyroid showing maximum SUV of 5.9 (image 58/3) nodule measuring up to 2.8 cm. Incidental CT findings: none CHEST: RIGHT thyroid abnormality as described. Some nodularity of the LEFT thyroid. No signs of hypermetabolic lymph nodes or suspicious pulmonary nodules in the chest. Lymph nodes with fatty hila in the LEFT axilla maximum SUV of 2.5 Incidental CT findings: Aortic atherosclerosis. Three-vessel coronary artery disease. No consolidation or evidence of pleural effusion. ABDOMEN/PELVIS: Diffuse activity throughout the uterus and cervix with a maximum SUV of 16.3. (Image 221/3) cervix is bulky measuring 6.3 x 4.0 cm at this level. Activity extends throughout the entire uterus. Multi cystic RIGHT adnexal lesion shows increased metabolic activity at the lateral margin with a maximum SUV of 6.7 (image 204/3) 6.2 x 4.5 cm. Small LEFT external iliac lymph nodes and common iliac lymph nodes are noted, most hypermetabolic with a maximum SUV of 4.1  measuring 7 mm (image 181/3 Incidental CT findings: No acute findings relative to liver, gallbladder, pancreas, spleen, adrenal glands, kidneys, stomach, small or large bowel. No pericecal stranding. Appendix not visualized. Colonic diverticulosis. SKELETON: No focal hypermetabolic activity to suggest skeletal metastasis. Incidental CT findings: Spinal degenerative  changes. IMPRESSION: Signs of cervical cancer with diffuse involvement of the uterus. Cystic and solid RIGHT ovarian lesion more likely related to diffuse cervical cancer. Concomitant synchronous cystic ovarian neoplasm could have this appearance but is felt less likely given the diffuse nature of disease throughout the uterus and cervix. Small lymph nodes in the pelvis in the LEFT pelvis suspicious for nodal involvement at the common iliac level. No solid organ or distant metastasis. RIGHT lingual tonsil and glossotonsillar sulcus uptake which is asymmetric and with increased fullness. While this may be physiologic would suggest direct visualization for further evaluation to exclude neoplasm in this area in the head and neck. RIGHT thyroid uptake with visible nodule. Recommend thyroid US and biopsy (ref: J Am Coll Radiol. 2015 Feb;12(2): 143-50). Aortic Atherosclerosis (ICD10-I70.0). Electronically Signed   By: Zetta Bills M.D.   On: 12/28/2020 18:06      ASSESSMENT & PLAN:  1. Encounter for antineoplastic chemotherapy   2. 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HCC)   3. Other vitamin B12 deficiency anemia   4. Abnormal positron emission tomography (PET) scan   5. Stage 3b chronic kidney disease ([place])   6. Normocytic anemia    #Locally advanced cervical cancer.  Stage III Not a surgical candidate. Currently on concurrent chemotherapy and radiation.  Labs are reviewed and discussed with patient. Proceed with cisplatin 55m/m2.   Difficulty of obtaining IV access, s/p medi port placement.   #Macrocytic anemia,  Today at 9.0  macrocytic. Continue vitamin B12 injection weekly x 3 .  #Abnormal PET scan with tonsil uptake.  Referred to ENT.    All questions were answered. The patient knows to call the clinic with any problems questions or concerns.  [place] [facility] [place]   Return of visit: 1 week, lab MD cisplatin + Vitamin B12   ZEarlie Server MD, PhD 01/24/2021

## 2021-01-24 NOTE — Progress Notes (Signed)
Pt in for follow up, very upset that she had to be seen today.

## 2021-01-24 NOTE — Patient Instructions (Signed)
[facility] [place] [facility]  Discharge Instructions: Thank you for choosing [facility] to provide your oncology and hematology care.  If you have a lab appointment with the Cancer Center, please go directly to the Cancer Center and check in at the registration area.  Wear comfortable clothing and clothing appropriate for easy access to any Portacath or PICC line.   We strive to give you quality time with your provider. You may need to reschedule your appointment if you arrive late (15 or more minutes).  Arriving late affects you and other patients whose appointments are after yours.  Also, if you miss three or more appointments without notifying the office, you may be dismissed from the clinic at the provider's discretion.      For prescription refill requests, have your pharmacy contact our office and allow 72 hours for refills to be completed.    Today you received the following chemotherapy and/or immunotherapy agents - cisplatin      To help prevent nausea and vomiting after your treatment, we encourage you to take your nausea medication as directed.  BELOW ARE SYMPTOMS THAT SHOULD BE REPORTED IMMEDIATELY: *FEVER GREATER THAN 100.4 F (38 C) OR HIGHER *CHILLS OR SWEATING *NAUSEA AND VOMITING THAT IS NOT CONTROLLED WITH YOUR NAUSEA MEDICATION *UNUSUAL SHORTNESS OF BREATH *UNUSUAL BRUISING OR BLEEDING *URINARY PROBLEMS (pain or burning when urinating, or frequent urination) *BOWEL PROBLEMS (unusual diarrhea, constipation, pain near the anus) TENDERNESS IN MOUTH AND THROAT WITH OR WITHOUT PRESENCE OF ULCERS (sore throat, sores in mouth, or a toothache) UNUSUAL RASH, SWELLING OR PAIN  UNUSUAL VAGINAL DISCHARGE OR ITCHING   Items with * indicate a potential emergency and should be followed up as soon as possible or go to the Emergency Department if any problems should occur.  Please show the CHEMOTHERAPY ALERT CARD or IMMUNOTHERAPY ALERT CARD at check-in  to the Emergency Department and triage nurse.  Should you have questions after your visit or need to cancel or reschedule your appointment, please contact [facility] [place] [facility]  [phone] and follow the prompts.  Office hours are 8:00 a.m. to 4:30 p.m. Monday - Friday. Please note that voicemails left after 4:00 p.m. may not be returned until the following business day.  We are closed weekends and major holidays. You have access to a nurse at all times for urgent questions. Please call the main number to the clinic [phone] and follow the prompts.  For any non-urgent questions, you may also contact your provider using MyChart. We now offer e-Visits for anyone 18 and older to request care online for non-urgent symptoms. For details visit [email or web address].   Also download the MyChart app! Go to the app store, search "MyChart", open the app, select [facility], and log in with your MyChart username and password.  Due to Covid, a mask is required upon entering the hospital/clinic. If you do not have a mask, one will be given to you upon arrival. For doctor visits, patients may have 1 support person aged 18 or older with them. For treatment visits, patients cannot have anyone with them due to current Covid guidelines and our immunocompromised population.   Cisplatin injection What is this medication? CISPLATIN (SIS pla tin) is a chemotherapy drug. It targets fast dividing cells, like cancer cells, and causes these cells to die. This medicine is used totreat many types of cancer like bladder, ovarian, and testicular cancers. This medicine may be used for other purposes; ask your health   care provider orpharmacist if you have questions. COMMON BRAND NAME(S): Platinol, Platinol -AQ What should I tell my care team before I take this medication? They need to know if you have any of these conditions: eye disease, vision problems hearing problems kidney disease low  blood counts, like white cells, platelets, or red blood cells tingling of the fingers or toes, or other nerve disorder an unusual or allergic reaction to cisplatin, carboplatin, oxaliplatin, other medicines, foods, dyes, or preservatives pregnant or trying to get pregnant breast-feeding How should I use this medication? This drug is given as an infusion into a vein. It is administered in a hospitalor clinic by a specially trained health care professional. Talk to your pediatrician regarding the use of this medicine in children.Special care may be needed. Overdosage: If you think you have taken too much of this medicine contact apoison control center or emergency room at once. NOTE: This medicine is only for you. Do not share this medicine with others. What if I miss a dose? It is important not to miss a dose. Call your doctor or health careprofessional if you are unable to keep an appointment. What may interact with this medication? This medicine may interact with the following medications: foscarnet certain antibiotics like amikacin, gentamicin, neomycin, polymyxin B, streptomycin, tobramycin, vancomycin This list may not describe all possible interactions. Give your health care provider a list of all the medicines, herbs, non-prescription drugs, or dietary supplements you use. Also tell them if you smoke, drink alcohol, or use illegaldrugs. Some items may interact with your medicine. What should I watch for while using this medication? Your condition will be monitored carefully while you are receiving this medicine. You will need important blood work done while you are taking thismedicine. This drug may make you feel generally unwell. This is not uncommon, as chemotherapy can affect healthy cells as well as cancer cells. Report any side effects. Continue your course of treatment even though you feel ill unless yourdoctor tells you to stop. This medicine may increase your risk of getting an  infection. Call your healthcare professional for advice if you get a fever, chills, or sore throat, or other symptoms of a cold or flu. Do not treat yourself. Try to avoid beingaround people who are sick. Avoid taking medicines that contain aspirin, acetaminophen, ibuprofen, naproxen, or ketoprofen unless instructed by your healthcare professional.These medicines may hide a fever. This medicine may increase your risk to bruise or bleed. Call your doctor orhealth care professional if you notice any unusual bleeding. Be careful brushing and flossing your teeth or using a toothpick because you may get an infection or bleed more easily. If you have any dental work done,tell your dentist you are receiving this medicine. Do not become pregnant while taking this medicine or for 14 months after stopping it. Women should inform their healthcare professional if they wish to become pregnant or think they might be pregnant. Men should not father a child while taking this medicine and for 11 months after stopping it. There is potential for serious side effects to an unborn child. Talk to your healthcareprofessional for more information. Do not breast-feed an infant while taking this medicine. This medicine has caused ovarian failure in some women. This medicine may make it more difficult to get pregnant. Talk to your healthcare professional if youare concerned about your fertility. This medicine has caused decreased sperm counts in some men. This may make it more difficult to father a child. Talk to your healthcare   professional if youare concerned about your fertility. Drink fluids as directed while you are taking this medicine. This will helpprotect your kidneys. Call your doctor or health care professional if you get diarrhea. Do not treatyourself. What side effects may I notice from receiving this medication? Side effects that you should report to your doctor or health care professionalas soon as  possible: allergic reactions like skin rash, itching or hives, swelling of the face, lips, or tongue blurred vision changes in vision decreased hearing or ringing of the ears nausea, vomiting pain, redness, or irritation at site where injected pain, tingling, numbness in the hands or feet signs and symptoms of bleeding such as bloody or black, tarry stools; red or dark brown urine; spitting up blood or brown material that looks like coffee grounds; red spots on the skin; unusual bruising or bleeding from the eyes, gums, or nose signs and symptoms of infection like fever; chills; cough; sore throat; pain or trouble passing urine signs and symptoms of kidney injury like trouble passing urine or change in the amount of urine signs and symptoms of low red blood cells or anemia such as unusually weak or tired; feeling faint or lightheaded; falls; breathing problems Side effects that usually do not require medical attention (report to yourdoctor or health care professional if they continue or are bothersome): loss of appetite mouth sores muscle cramps This list may not describe all possible side effects. Call your doctor for medical advice about side effects. You may report side effects to FDA at1-800-FDA-1088. Where should I keep my medication? This drug is given in a hospital or clinic and will not be stored at home. NOTE: This sheet is a summary. It may not cover all possible information. If you have questions about this medicine, talk to your doctor, pharmacist, orhealth care provider.  2022 Elsevier/Gold Standard (2018-04-10 15:59:17)  

## 2021-01-25 ENCOUNTER — Ambulatory Visit
Admission: RE | Admit: 2021-01-25 | Discharge: 2021-01-25 | Disposition: A | Discharge status: Home / Self Care | Payer: Medicare Other | Source: Ambulatory Visit | Attending: Radiation Oncology | Admitting: Radiation Oncology

## 2021-01-25 ENCOUNTER — Inpatient Hospital Stay: Payer: Medicare Other

## 2021-01-25 DIAGNOSIS — C539 Malignant neoplasm of cervix uteri, unspecified: Secondary | ICD-10-CM | POA: Diagnosis not present

## 2021-01-26 ENCOUNTER — Other Ambulatory Visit: Payer: Self-pay

## 2021-01-26 ENCOUNTER — Inpatient Hospital Stay: Payer: Medicare Other

## 2021-01-26 ENCOUNTER — Ambulatory Visit
Admission: RE | Admit: 2021-01-26 | Discharge: 2021-01-26 | Disposition: A | Discharge status: Home / Self Care | Payer: Medicare Other | Source: Ambulatory Visit | Attending: Radiation Oncology | Admitting: Radiation Oncology

## 2021-01-26 DIAGNOSIS — C539 Malignant neoplasm of cervix uteri, unspecified: Secondary | ICD-10-CM | POA: Diagnosis not present

## 2021-01-29 ENCOUNTER — Inpatient Hospital Stay: Payer: Medicare Other

## 2021-01-29 ENCOUNTER — Ambulatory Visit
Admission: RE | Admit: 2021-01-29 | Discharge: 2021-01-29 | Disposition: A | Discharge status: Home / Self Care | Payer: Medicare Other | Source: Ambulatory Visit | Attending: Radiation Oncology | Admitting: Radiation Oncology

## 2021-01-29 DIAGNOSIS — Z5111 Encounter for antineoplastic chemotherapy: Secondary | ICD-10-CM | POA: Insufficient documentation

## 2021-01-29 DIAGNOSIS — Z23 Encounter for immunization: Secondary | ICD-10-CM | POA: Insufficient documentation

## 2021-01-29 DIAGNOSIS — C539 Malignant neoplasm of cervix uteri, unspecified: Secondary | ICD-10-CM | POA: Insufficient documentation

## 2021-01-29 DIAGNOSIS — Z510 Encounter for antineoplastic radiation therapy: Secondary | ICD-10-CM | POA: Diagnosis not present

## 2021-01-29 DIAGNOSIS — F17210 Nicotine dependence, cigarettes, uncomplicated: Secondary | ICD-10-CM | POA: Insufficient documentation

## 2021-01-29 DIAGNOSIS — E042 Nontoxic multinodular goiter: Secondary | ICD-10-CM | POA: Insufficient documentation

## 2021-01-29 DIAGNOSIS — D519 Vitamin B12 deficiency anemia, unspecified: Secondary | ICD-10-CM | POA: Insufficient documentation

## 2021-01-30 ENCOUNTER — Inpatient Hospital Stay: Payer: Medicare Other

## 2021-01-30 ENCOUNTER — Ambulatory Visit
Admission: RE | Admit: 2021-01-30 | Discharge: 2021-01-30 | Disposition: A | Discharge status: Home / Self Care | Payer: Medicare Other | Source: Ambulatory Visit | Attending: Radiation Oncology | Admitting: Radiation Oncology

## 2021-01-30 DIAGNOSIS — C539 Malignant neoplasm of cervix uteri, unspecified: Secondary | ICD-10-CM | POA: Diagnosis not present

## 2021-01-31 ENCOUNTER — Inpatient Hospital Stay: Payer: Medicare Other

## 2021-01-31 ENCOUNTER — Ambulatory Visit
Admission: RE | Admit: 2021-01-31 | Discharge: 2021-01-31 | Disposition: A | Discharge status: Home / Self Care | Payer: Medicare Other | Source: Ambulatory Visit | Attending: Radiation Oncology | Admitting: Radiation Oncology

## 2021-01-31 DIAGNOSIS — C539 Malignant neoplasm of cervix uteri, unspecified: Secondary | ICD-10-CM | POA: Diagnosis not present

## 2021-02-01 ENCOUNTER — Encounter: Payer: Self-pay | Admitting: Oncology

## 2021-02-01 ENCOUNTER — Inpatient Hospital Stay (HOSPITAL_BASED_OUTPATIENT_CLINIC_OR_DEPARTMENT_OTHER): Payer: Medicare Other | Admitting: Oncology

## 2021-02-01 ENCOUNTER — Ambulatory Visit
Admission: RE | Admit: 2021-02-01 | Discharge: 2021-02-01 | Disposition: A | Discharge status: Home / Self Care | Payer: Medicare Other | Source: Ambulatory Visit | Attending: Radiation Oncology | Admitting: Radiation Oncology

## 2021-02-01 ENCOUNTER — Inpatient Hospital Stay: Payer: Medicare Other

## 2021-02-01 ENCOUNTER — Other Ambulatory Visit: Payer: Self-pay

## 2021-02-01 VITALS — BP 147/92 | HR 91 | Temp 98.4°F | Resp 18 | Wt 141.6 lb

## 2021-02-01 DIAGNOSIS — C539 Malignant neoplasm of cervix uteri, unspecified: Secondary | ICD-10-CM | POA: Diagnosis not present

## 2021-02-01 DIAGNOSIS — D518 Other vitamin B12 deficiency anemias: Secondary | ICD-10-CM | POA: Diagnosis not present

## 2021-02-01 DIAGNOSIS — N1832 Chronic kidney disease, stage 3b: Secondary | ICD-10-CM | POA: Diagnosis not present

## 2021-02-01 DIAGNOSIS — D649 Anemia, unspecified: Secondary | ICD-10-CM

## 2021-02-01 DIAGNOSIS — Z5111 Encounter for antineoplastic chemotherapy: Secondary | ICD-10-CM

## 2021-02-01 LAB — COMPREHENSIVE METABOLIC PANEL
ALT: 10 U/L (ref 0–44)
AST: 17 U/L (ref 15–41)
Albumin: 3.1 g/dL — ABNORMAL LOW (ref 3.5–5.0)
Alkaline Phosphatase: 49 U/L (ref 38–126)
Anion gap: 7 (ref 5–15)
BUN: 12 mg/dL (ref 8–23)
CO2: 26 mmol/L (ref 22–32)
Calcium: 8.5 mg/dL — ABNORMAL LOW (ref 8.9–10.3)
Chloride: 105 mmol/L (ref 98–111)
Creatinine, Ser: 1.01 mg/dL — ABNORMAL HIGH (ref 0.44–1.00)
GFR, Estimated: 59 mL/min — ABNORMAL LOW (ref >60)
Glucose, Bld: 99 mg/dL (ref 70–99)
Potassium: 3.8 mmol/L (ref 3.5–5.1)
Sodium: 138 mmol/L (ref 135–145)
Total Bilirubin: 0.5 mg/dL (ref 0.3–1.2)
Total Protein: 6.7 g/dL (ref 6.5–8.1)

## 2021-02-01 LAB — CBC WITH DIFFERENTIAL/PLATELET
Abs Immature Granulocytes: 0.03 10*3/uL (ref 0.00–0.07)
Basophils Absolute: 0 10*3/uL (ref 0.0–0.1)
Basophils Relative: 1 %
Eosinophils Absolute: 0.5 10*3/uL (ref 0.0–0.5)
Eosinophils Relative: 8 %
HCT: 28.6 % — ABNORMAL LOW (ref 36.0–46.0)
Hemoglobin: 9.5 g/dL — ABNORMAL LOW (ref 12.0–15.0)
Immature Granulocytes: 1 %
Lymphocytes Relative: 9 %
Lymphs Abs: 0.6 10*3/uL — ABNORMAL LOW (ref 0.7–4.0)
MCH: 36.4 pg — ABNORMAL HIGH (ref 26.0–34.0)
MCHC: 33.2 g/dL (ref 30.0–36.0)
MCV: 109.6 fL — ABNORMAL HIGH (ref 80.0–100.0)
Monocytes Absolute: 0.6 10*3/uL (ref 0.1–1.0)
Monocytes Relative: 9 %
Neutro Abs: 4.7 10*3/uL (ref 1.7–7.7)
Neutrophils Relative %: 72 %
Platelets: 241 10*3/uL (ref 150–400)
RBC: 2.61 MIL/uL — ABNORMAL LOW (ref 3.87–5.11)
RDW: 15.2 % (ref 11.5–15.5)
WBC: 6.5 10*3/uL (ref 4.0–10.5)
nRBC: 0 % (ref 0.0–0.2)

## 2021-02-01 LAB — MAGNESIUM: Magnesium: 1.7 mg/dL (ref 1.7–2.4)

## 2021-02-01 MED ORDER — HEPARIN SOD (PORK) LOCK FLUSH 100 UNIT/ML IV SOLN
INTRAVENOUS | Status: AC
  Administered 2021-02-01: 500 [IU]
  Filled 2021-02-01: qty 5

## 2021-02-01 MED ORDER — POTASSIUM CHLORIDE IN NACL 20-0.9 MEQ/L-% IV SOLN
Freq: Once | INTRAVENOUS | Status: AC
  Filled 2021-02-01: qty 1000

## 2021-02-01 MED ORDER — SODIUM CHLORIDE 0.9 % IV SOLN
51.0000 mg | Freq: Once | INTRAVENOUS | Status: AC
  Administered 2021-02-01: 51 mg via INTRAVENOUS
  Filled 2021-02-01: qty 51

## 2021-02-01 MED ORDER — SODIUM CHLORIDE 0.9 % IV SOLN
10.0000 mg | Freq: Once | INTRAVENOUS | Status: AC
  Administered 2021-02-01: 10 mg via INTRAVENOUS
  Filled 2021-02-01: qty 10

## 2021-02-01 MED ORDER — HEPARIN SOD (PORK) LOCK FLUSH 100 UNIT/ML IV SOLN
500.0000 [IU] | Freq: Once | INTRAVENOUS | Status: AC | PRN
  Filled 2021-02-01: qty 5

## 2021-02-01 MED ORDER — SODIUM CHLORIDE 0.9 % IV SOLN
150.0000 mg | Freq: Once | INTRAVENOUS | Status: AC
  Administered 2021-02-01: 150 mg via INTRAVENOUS
  Filled 2021-02-01: qty 150

## 2021-02-01 MED ORDER — CYANOCOBALAMIN 1000 MCG/ML IJ SOLN
1000.0000 ug | Freq: Once | INTRAMUSCULAR | Status: AC
  Administered 2021-02-01: 1000 ug via INTRAMUSCULAR
  Filled 2021-02-01: qty 1

## 2021-02-01 MED ORDER — MAGNESIUM SULFATE 2 GM/50ML IV SOLN
2.0000 g | Freq: Once | INTRAVENOUS | Status: AC
  Administered 2021-02-01: 2 g via INTRAVENOUS
  Filled 2021-02-01: qty 50

## 2021-02-01 MED ORDER — PALONOSETRON HCL INJECTION 0.25 MG/5ML
0.2500 mg | Freq: Once | INTRAVENOUS | Status: AC
  Administered 2021-02-01: 0.25 mg via INTRAVENOUS
  Filled 2021-02-01: qty 5

## 2021-02-01 MED ORDER — SODIUM CHLORIDE 0.9 % IV SOLN
Freq: Once | INTRAVENOUS | Status: AC
  Filled 2021-02-01: qty 250

## 2021-02-01 NOTE — Progress Notes (Signed)
Hematology/Oncology progress note [facility] Telephone:([phone] [phone]   Patient Care Team: Center, [facility] as PCP - General (General Practice) Clent Jacks, RN as Oncology Nurse Navigator Earlie Server, MD as Consulting Physician (Oncology) Earlie Server, MD as Consulting Physician (Hematology and Oncology)  REFERRING PROVIDER: [facility], [place] VISIT:  Follow up for treatment of cervical cancer  HISTORY OF PRESENTING ILLNESS:   Rebecca Parker is a  70 y.o.  female with PMH listed below was seen in consultation at the request of  Center, Jenny Reichmann*  for evaluation of cervical cancer   12/01/2020-8/9/022 She was admitted from the ED for 1 day h/o right lower abdominal / pelvic pain . 12/01/2020 CT abdomen pelvis w contrast showed  thickly septated right ovarian mass measuring 5.1 x 5.1 cm. 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 Recommend initial pelvic ultrasound and likely subsequent MRI to further evaluate, which may be performed on a nonemergent basis. 2. Air within the fundal endometrial cavity. Correlate for recent instrumentation. 3. Pancolonic diverticulosis without evidence of acute diverticulitis.  12/01/2020 Fx D+C  and cervical bx. Uterus sounded to 9 cm.   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   Diastolic CHF and Diabetes.  UDS + for cocaine.   She is the oldest of 7 kids (5 girls and 2 boys). She has 3 children - all SVDs - 2 boys and one girl.  She was seen by GynOnc Dr.Secord.  Her pelvic examination showed On palpations suspect lateral vaginal disease >50% down  the vault and disease involving the left fornix. Cervix enlarged >4 cm with grossly obvious tumor and hard to palpation. Uterus - may be enlarged with firm mass on the right aspect versus palpation of the right adnexal mass. Positive parametrial involvement on the right and on the left with disease to or almost to the sidewall on the left. Rectovaginal exam was confirmatory.    # 8/31 2022, PET scan showed signs of cervical cancer with diffuse involvement of the uterus.  Cystic and solid right ovarian lesion more likely related to diffuse cervical cancer.  Concomitant synchronous cystic ovarian neoplasm could have appearance but feel less likely given the diffuse nature of disease throughout the uterus and cervix.  Small lymph nodes in the pelvis in the left pelvis suspicious for nodal involvement at the common iliac level.  No solid organ distant metastasis. Right lung renal consult and glossotonsillar sulcus uptake which is asymmetric and with increased fullness.  While this may be physiologic, will suggest direct visualization for further evaluation to exclude neoplasm. Right thyroid uptake with visible nodule.  Recommend ultrasound thyroid and biopsy.  Patient has had baseline audiogram done and chemotherapy class. 01/03/2021 cisplatin 20 mg/m2 today.  Dose were reduced to 50% due to her impaired kidney function.  Treatment plan was switched to carboplatin given her decreased kidney function 01/10/2021 carboplatin. Kidney function improved. 01/17/2021 cisplatin.   PD-L1 CPS 20%  INTERVAL HISTORY Rebecca Parker is a 70 y.o. female who has above history reviewed by me today presents for follow up visit for treatment of cervix cancer. 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  She is a poor historian. She is here by herself. Denies any nausea vomiting, diarrhea.    Review of Systems  Constitutional:   Positive for fatigue. Negative for appetite change, chills and fever.  HENT:   Negative for hearing loss and voice change.   Eyes:  Negative for eye problems.  Respiratory:  Negative for chest tightness and cough.   Cardiovascular:  Negative for chest pain.  Gastrointestinal:  Negative for abdominal distention, abdominal pain and blood in stool.  Endocrine: Negative for hot flashes.  Genitourinary:  Negative for difficulty urinating and frequency.   Musculoskeletal:  Negative for arthralgias.  Skin:  Negative for itching and rash.  Neurological:  Positive for numbness. Negative for extremity weakness.  Hematological:  Negative for adenopathy.  Psychiatric/Behavioral:  Negative for confusion.    MEDICAL HISTORY:  Past Medical History:  Diagnosis Date   B12 deficiency anemia 01/03/2021   Chest pain, unspecified    CHF (congestive heart failure) ([place])    Diabetes mellitus without complication (HCC)    Hip pain    Hypertension    Multinodular goiter     SURGICAL HISTORY: Past Surgical History:  Procedure Laterality Date   COLONOSCOPY WITH PROPOFOL N/A 11/24/2015   Procedure: COLONOSCOPY WITH PROPOFOL;  Surgeon: Lollie Sails, MD;  Location: [facility] ENDOSCOPY;  Service: Endoscopy;  Laterality: N/A;   COLONOSCOPY WITH PROPOFOL N/A 03/18/2019   Procedure: COLONOSCOPY WITH PROPOFOL;  Surgeon: Virgel Manifold, MD;  Location: [facility] ENDOSCOPY;  Service: Endoscopy;  Laterality: N/A;   DILATION AND CURETTAGE OF UTERUS N/A 12/01/2020   Procedure: DILATATION AND CURETTAGE with cervical biopsies;  Surgeon: Schermerhorn, Gwen Her, MD;  Location: [facility] ORS;  Service: Gynecology;  Laterality: N/A;   ESOPHAGOGASTRODUODENOSCOPY (EGD) WITH PROPOFOL N/A 03/18/2019   Procedure: ESOPHAGOGASTRODUODENOSCOPY (EGD) WITH PROPOFOL;  Surgeon: Virgel Manifold, MD;  Location: [facility] ENDOSCOPY;  Service: Endoscopy;  Laterality: N/A;   PORTA CATH INSERTION N/A 01/16/2021   Procedure: PORTA CATH INSERTION;   Surgeon: Katha Cabal, MD;  Location: [facility] CV LAB;  Service: Cardiovascular;  Laterality: N/A;   right ankle orif      SOCIAL HISTORY: Social History   Socioeconomic History   Marital status: Legally Separated    Spouse name: Not on file   Number of children: Not on file   Years of education: Not on file   Highest education level: Not on file  Occupational History   Not on file  Tobacco Use   Smoking status: Every Day    Packs/day: 0.10    Types: Cigarettes   Smokeless tobacco: Never  Substance and Sexual Activity   Alcohol use: Not Currently   Drug use: Not on file   Sexual activity: Not Currently  Other Topics Concern   Not on file  Social History Narrative   Not on file   Social Determinants of Health   Financial Resource Strain: Not on file  Food Insecurity: Not on file  Transportation Needs: Not on file  Physical Activity: Not on file  Stress: Not on file  Social Connections: Not on file  Intimate Partner Violence: Not on file    FAMILY HISTORY: Family History  Problem Relation Age of Onset   Breast cancer Maternal Aunt    Breast cancer Paternal Aunt     ALLERGIES:  is allergic to ace inhibitors and cyclobenzaprine.  MEDICATIONS:  Current Outpatient Medications  Medication Sig Dispense Refill  aspirin EC 81 MG tablet Take 81 mg by mouth daily.     Calcium Carbonate-Vitamin D 500-125 MG-UNIT TABS Take 1 tablet by mouth daily.     docusate sodium (COLACE) 100 MG capsule Take 100 mg by mouth daily.     Ferrous Sulfate (IRON) 325 (65 Fe) MG TABS Take 1 tablet by mouth 2 (two) times daily.     furosemide (LASIX) 20 MG tablet Take 20 mg by mouth 2 (two) times daily.     gabapentin (NEURONTIN) 800 MG tablet Take 800 mg by mouth 2 (two) times daily.     hydrOXYzine (ATARAX/VISTARIL) 25 MG tablet Take 25 mg by mouth daily. Take 40m. By mouth every morning     lidocaine-prilocaine (EMLA) cream Apply 1 application topically as needed.      losartan (COZAAR) 50 MG tablet Take 50 mg by mouth 2 (two) times daily.     metFORMIN (GLUCOPHAGE) 500 MG tablet Take 500 mg by mouth 2 (two) times daily with a meal.     mirtazapine (REMERON) 15 MG tablet Take 15 mg by mouth at bedtime.     simvastatin (ZOCOR) 20 MG tablet Take 20 mg by mouth daily.     ondansetron (ZOFRAN) 4 MG tablet Take 1 tablet (4 mg total) by mouth every 6 (six) hours as needed for nausea. (Patient not taking: Reported on 02/01/2021) 20 tablet 0   oxyCODONE (OXY IR/ROXICODONE) 5 MG immediate release tablet Take 1 tablet (5 mg total) by mouth every 8 (eight) hours as needed for moderate pain. (Patient not taking: No sig reported) 15 tablet 0   No current facility-administered medications for this visit.   Facility-Administered Medications Ordered in Other Visits  Medication Dose Route Frequency Provider Last Rate Last Admin   CISplatin (PLATINOL) 51 mg in sodium chloride 0.9 % 250 mL chemo infusion  51 mg Intravenous Once YEarlie Server MD       heparin lock flush 100 UNIT/ML injection            heparin lock flush 100 unit/mL  500 Units Intracatheter Once PRN YEarlie Server MD         PHYSICAL EXAMINATION: ECOG PERFORMANCE STATUS: 2 - Symptomatic, <50% confined to bed Vitals:   02/01/21 0853  BP: (!) 147/92  Pulse: 91  Resp: 18  Temp: 98.4 F (36.9 C)   Filed Weights   02/01/21 0853  Weight: 141 lb 9.6 oz (64.2 kg)    Physical Exam Constitutional:      General: She is not in acute distress.    Comments: She is able to ambulate with a walker  HENT:     Head: Normocephalic and atraumatic.  Eyes:     General: No scleral icterus. Cardiovascular:     Rate and Rhythm: Normal rate and regular rhythm.     Heart sounds: Normal heart sounds.  Pulmonary:     Effort: Pulmonary effort is normal. No respiratory distress.     Breath sounds: No wheezing.  Abdominal:     General: Bowel sounds are normal. There is no distension.     Palpations: Abdomen is soft.   Musculoskeletal:        General: No deformity. Normal range of motion.     Cervical back: Normal range of motion and neck supple.  Skin:    General: Skin is warm and dry.     Findings: No erythema or rash.  Neurological:     Mental Status: She is alert and oriented to  person, place, and time. Mental status is at baseline.     Cranial Nerves: No cranial nerve deficit.     Coordination: Coordination normal.  Psychiatric:        Mood and Affect: Mood normal.    LABORATORY DATA:  I have reviewed the data as listed Lab Results  Component Value Date   WBC 6.5 02/01/2021   HGB 9.5 (L) 02/01/2021   HCT 28.6 (L) 02/01/2021   MCV 109.6 (H) 02/01/2021   PLT 241 02/01/2021   Recent Labs    01/17/21 0850 01/24/21 0832 02/01/21 0830  NA 141 139 138  K 4.0 3.7 3.8  CL 106 107 105  CO2 _0 GLUCOSE 82 95 99  BUN 9 6* 12  CREATININE 1.01* 0.87 1.01*  CALCIUM 8.8* 8.4* 8.5*  GFRNONAA 59* >60 59*  PROT 6.8 6.5 6.7  ALBUMIN 3.0* 3.0* 3.1*  AST _1 ALT _2 ALKPHOS 56 51 49  BILITOT 0.5 0.4 0.5    Iron/TIBC/Ferritin/ %Sat    Component Value Date/Time   IRON 85 12/26/2020 0846   IRON 90 02/24/2019 1550   TIBC 204 (L) 12/26/2020 0846   TIBC 292 02/24/2019 1550   FERRITIN 25 12/26/2020 0846   FERRITIN 33 02/24/2019 1550   IRONPCTSAT 42 (H) 12/26/2020 0846   IRONPCTSAT 31 02/24/2019 1550       RADIOGRAPHIC STUDIES: I have personally reviewed the radiological images as listed and agreed with the findings in the report. PERIPHERAL VASCULAR CATHETERIZATION  Result Date: 01/16/2021 See surgical note for result.     ASSESSMENT & PLAN:  1. 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[place])   2. Encounter for antineoplastic chemotherapy   3. Stage 3b chronic kidney disease (HCC)   4. Other vitamin B12 deficiency anemia   5. Normocytic anemia    #Locally advanced cervical cancer.  Stage III Not a surgical candidate. Currently on concurrent chemotherapy and radiation.   Labs are reviewed and discussed with patient. Proceed with cisplatin 33m/m2  Porta Cath in place due to lack of IV access.   #Macrocytic anemia,  Today at 9.0  macrocytic. Continue vitamin B12 injection weekly for a total of 4 treatments.eventually plan monthly B12  #Abnormal PET scan with tonsil uptake. I have referred her to ENT.    All questions were answered. The patient knows to call the clinic with any problems questions or concerns.  cc [facility], [place]   Return of visit: 1 week, lab MD cisplatin + Vitamin B12   ZEarlie Server MD, PhD 02/01/2021

## 2021-02-01 NOTE — Patient Instructions (Signed)
[facility]   Discharge Instructions: Thank you for choosing [facility] to provide your oncology and hematology care.  If you have a lab appointment with the [facility], please go directly to the [facility] and check in at the registration area.  Wear comfortable clothing and clothing appropriate for easy access to any Portacath or PICC line.   We strive to give you quality time with your provider. You may need to reschedule your appointment if you arrive late (15 or more minutes).  Arriving late affects you and other patients whose appointments are after yours.  Also, if you miss three or more appointments without notifying the office, you may be dismissed from the clinic at the provider's discretion.      For prescription refill requests, have your pharmacy contact our office and allow 72 hours for refills to be completed.    Today you received the following chemotherapy and/or immunotherapy agents: Cisplatin. Today you also received the following: Vitamin B-12 injection.      To help prevent nausea and vomiting after your treatment, we encourage you to take your nausea medication as directed.  BELOW ARE SYMPTOMS THAT SHOULD BE REPORTED IMMEDIATELY: *FEVER GREATER THAN 100.4 F (38 C) OR HIGHER *CHILLS OR SWEATING *NAUSEA AND VOMITING THAT IS NOT CONTROLLED WITH YOUR NAUSEA MEDICATION *UNUSUAL SHORTNESS OF BREATH *UNUSUAL BRUISING OR BLEEDING *URINARY PROBLEMS (pain or burning when urinating, or frequent urination) *BOWEL PROBLEMS (unusual diarrhea, constipation, pain near the anus) TENDERNESS IN MOUTH AND THROAT WITH OR WITHOUT PRESENCE OF ULCERS (sore throat, sores in mouth, or a toothache) UNUSUAL RASH, SWELLING OR PAIN  UNUSUAL VAGINAL DISCHARGE OR ITCHING   Items with * indicate a potential emergency and should be followed up as soon as possible or go to the Emergency Department if any problems should occur.  Please show  the CHEMOTHERAPY ALERT CARD or IMMUNOTHERAPY ALERT CARD at check-in to the Emergency Department and triage nurse.  Should you have questions after your visit or need to cancel or reschedule your appointment, please contact [facility]  [phone] and follow the prompts.  Office hours are 8:00 a.m. to 4:30 p.m. Monday - Friday. Please note that voicemails left after 4:00 p.m. may not be returned until the following business day.  We are closed weekends and major holidays. You have access to a nurse at all times for urgent questions. Please call the main number to the clinic [phone] and follow the prompts.  For any non-urgent questions, you may also contact your provider using MyChart. We now offer e-Visits for anyone 60 and older to request care online for non-urgent symptoms. For details visit [email or web address].   Also download the MyChart app! Go to the app store, search "MyChart", open the app, select , and log in with your MyChart username and password.  Due to Covid, a mask is required upon entering the hospital/clinic. If you do not have a mask, one will be given to you upon arrival. For doctor visits, patients may have 1 support person aged 43 or older with them. For treatment visits, patients cannot have anyone with them due to current Covid guidelines and our immunocompromised population.

## 2021-02-01 NOTE — Progress Notes (Signed)
Patient here for follow. No new concerns voiced.

## 2021-02-01 NOTE — Progress Notes (Signed)
Magnesium lab is being added on today. Per MD, Dr. Tasia Catchings, order: staff does not need to wait for Magnesium lab result; okay for staff to proceed with scheduled Cisplatin treatment at this time.

## 2021-02-02 ENCOUNTER — Inpatient Hospital Stay: Payer: Medicare Other

## 2021-02-02 ENCOUNTER — Ambulatory Visit
Admission: RE | Admit: 2021-02-02 | Discharge: 2021-02-02 | Disposition: A | Discharge status: Home / Self Care | Payer: Medicare Other | Source: Ambulatory Visit | Attending: Radiation Oncology | Admitting: Radiation Oncology

## 2021-02-02 DIAGNOSIS — C539 Malignant neoplasm of cervix uteri, unspecified: Secondary | ICD-10-CM | POA: Diagnosis not present

## 2021-02-05 ENCOUNTER — Inpatient Hospital Stay: Payer: Medicare Other

## 2021-02-05 ENCOUNTER — Ambulatory Visit
Admission: RE | Admit: 2021-02-05 | Discharge: 2021-02-05 | Disposition: A | Discharge status: Home / Self Care | Payer: Medicare Other | Source: Ambulatory Visit | Attending: Radiation Oncology | Admitting: Radiation Oncology

## 2021-02-05 DIAGNOSIS — C539 Malignant neoplasm of cervix uteri, unspecified: Secondary | ICD-10-CM | POA: Diagnosis not present

## 2021-02-06 ENCOUNTER — Emergency Department
Admission: EM | Admit: 2021-02-06 | Discharge: 2021-02-06 | Disposition: A | Discharge status: Home / Self Care | Payer: Medicare Other | Attending: Emergency Medicine | Admitting: Emergency Medicine

## 2021-02-06 ENCOUNTER — Other Ambulatory Visit: Payer: Self-pay

## 2021-02-06 ENCOUNTER — Ambulatory Visit
Admission: RE | Admit: 2021-02-06 | Discharge: 2021-02-06 | Disposition: A | Discharge status: Home / Self Care | Payer: Medicare Other | Source: Ambulatory Visit | Attending: Radiation Oncology | Admitting: Radiation Oncology

## 2021-02-06 ENCOUNTER — Ambulatory Visit: Payer: Medicare Other

## 2021-02-06 ENCOUNTER — Inpatient Hospital Stay: Payer: Medicare Other

## 2021-02-06 DIAGNOSIS — F17210 Nicotine dependence, cigarettes, uncomplicated: Secondary | ICD-10-CM | POA: Diagnosis not present

## 2021-02-06 DIAGNOSIS — R630 Anorexia: Secondary | ICD-10-CM | POA: Insufficient documentation

## 2021-02-06 DIAGNOSIS — R410 Disorientation, unspecified: Secondary | ICD-10-CM | POA: Diagnosis not present

## 2021-02-06 DIAGNOSIS — I110 Hypertensive heart disease with heart failure: Secondary | ICD-10-CM | POA: Insufficient documentation

## 2021-02-06 DIAGNOSIS — Z79899 Other long term (current) drug therapy: Secondary | ICD-10-CM | POA: Diagnosis not present

## 2021-02-06 DIAGNOSIS — R55 Syncope and collapse: Secondary | ICD-10-CM | POA: Insufficient documentation

## 2021-02-06 DIAGNOSIS — N3090 Cystitis, unspecified without hematuria: Secondary | ICD-10-CM | POA: Insufficient documentation

## 2021-02-06 DIAGNOSIS — I5032 Chronic diastolic (congestive) heart failure: Secondary | ICD-10-CM | POA: Diagnosis not present

## 2021-02-06 DIAGNOSIS — E119 Type 2 diabetes mellitus without complications: Secondary | ICD-10-CM | POA: Insufficient documentation

## 2021-02-06 DIAGNOSIS — Z7982 Long term (current) use of aspirin: Secondary | ICD-10-CM | POA: Insufficient documentation

## 2021-02-06 DIAGNOSIS — Z7984 Long term (current) use of oral hypoglycemic drugs: Secondary | ICD-10-CM | POA: Diagnosis not present

## 2021-02-06 LAB — URINALYSIS, COMPLETE (UACMP) WITH MICROSCOPIC
Bilirubin Urine: NEGATIVE
Glucose, UA: NEGATIVE mg/dL
Hgb urine dipstick: NEGATIVE
Ketones, ur: NEGATIVE mg/dL
Nitrite: NEGATIVE
Protein, ur: 30 mg/dL — AB
Specific Gravity, Urine: 1.016 (ref 1.005–1.030)
pH: 5 (ref 5.0–8.0)

## 2021-02-06 LAB — BASIC METABOLIC PANEL
Anion gap: 8 (ref 5–15)
BUN: 10 mg/dL (ref 8–23)
CO2: 27 mmol/L (ref 22–32)
Calcium: 8.4 mg/dL — ABNORMAL LOW (ref 8.9–10.3)
Chloride: 104 mmol/L (ref 98–111)
Creatinine, Ser: 1.12 mg/dL — ABNORMAL HIGH (ref 0.44–1.00)
GFR, Estimated: 52 mL/min — ABNORMAL LOW (ref >60)
Glucose, Bld: 70 mg/dL (ref 70–99)
Potassium: 3.5 mmol/L (ref 3.5–5.1)
Sodium: 139 mmol/L (ref 135–145)

## 2021-02-06 LAB — TROPONIN I (HIGH SENSITIVITY): Troponin I (High Sensitivity): 5 ng/L (ref <18)

## 2021-02-06 LAB — CBC
HCT: 29.8 % — ABNORMAL LOW (ref 36.0–46.0)
Hemoglobin: 10.2 g/dL — ABNORMAL LOW (ref 12.0–15.0)
MCH: 37.1 pg — ABNORMAL HIGH (ref 26.0–34.0)
MCHC: 34.2 g/dL (ref 30.0–36.0)
MCV: 108.4 fL — ABNORMAL HIGH (ref 80.0–100.0)
Platelets: 212 10*3/uL (ref 150–400)
RBC: 2.75 MIL/uL — ABNORMAL LOW (ref 3.87–5.11)
RDW: 14.3 % (ref 11.5–15.5)
WBC: 5.3 10*3/uL (ref 4.0–10.5)
nRBC: 0 % (ref 0.0–0.2)

## 2021-02-06 LAB — MAGNESIUM: Magnesium: 1.9 mg/dL (ref 1.7–2.4)

## 2021-02-06 MED ORDER — CEFDINIR 300 MG PO CAPS: 300.0000 mg | ORAL_CAPSULE | Freq: Two times a day (BID) | ORAL | 0 refills | Status: AC

## 2021-02-06 MED ORDER — SODIUM CHLORIDE 0.9 % IV SOLN
1.0000 g | Freq: Once | INTRAVENOUS | Status: AC
  Administered 2021-02-06: 1 g via INTRAVENOUS
  Filled 2021-02-06: qty 10

## 2021-02-06 MED ORDER — LACTATED RINGERS IV BOLUS
500.0000 mL | Freq: Once | INTRAVENOUS | Status: AC
  Administered 2021-02-06: 500 mL via INTRAVENOUS

## 2021-02-06 MED ORDER — ACETAMINOPHEN 500 MG PO TABS
1000.0000 mg | ORAL_TABLET | Freq: Once | ORAL | Status: AC
  Administered 2021-02-06: 1000 mg via ORAL
  Filled 2021-02-06: qty 2

## 2021-02-06 NOTE — ED Provider Notes (Signed)
[facility] Emergency Department Provider Note  ____________________________________________   Event Date/Time   First MD Initiated Contact with Patient 02/06/21 1759     (approximate)  I have reviewed the triage vital signs and the nursing notes.   HISTORY  Chief Complaint Loss of Consciousness    HPI Rebecca Parker is a 70 y.o. female with history of diabetes, CHF, here with syncopal episode.  The patient reportedly was at radiation today.  She states that during radiation, she is set in a weird position and has worsening left leg pain, numbness, throughout the session.  This is been a recurrent issue.  She states that she has left leg pain throughout session today and when they rolled her to sit her up, she began to feel lightheaded.  She felt dizzy.  She then thought she was going to pass out.  She was laid back and did not actually fully syncopized.  She was slightly confused.  She states she returns to back to her baseline now.  She does report that this is happened before when she is in radiation.  She now feels back to her baseline.  She does endorse some mild decreased appetite due to receiving chemotherapy and has been eating and drinking less.  She is also had some urinary frequency.  No complaints otherwise.  No fevers or chills.    Past Medical History:  Diagnosis Date   B12 deficiency anemia 01/03/2021   Chest pain, unspecified    CHF (congestive heart failure) ([place])    Diabetes mellitus without complication ([place])    Hip pain    Hypertension    Multinodular goiter     Patient Active Problem List   Diagnosis Date Noted   Port-A-Cath in place 01/17/2021   Abnormal positron emission tomography (PET) scan 01/17/2021   Tobacco use 01/03/2021   Encounter for antineoplastic chemotherapy 01/03/2021   Vitamin B12 deficiency 01/03/2021   B12 deficiency anemia 01/03/2021   Normocytic anemia 12/23/2020   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[place])  12/23/2020   Goals of care, counseling/discussion 12/23/2020   Cocaine abuse ([place]) 12/23/2020   Sepsis ([place]) 12/01/2020   Ovarian mass, right 12/01/2020   Acute lower UTI 12/01/2020   Pelvic pain 12/01/2020   Morbid (severe) obesity due to excess calories ([place]) 11/15/2015   Mixed incontinence 10/24/2015   Venous insufficiency of both lower extremities 11/24/2014   Benign essential hypertension 11/18/2014   Chronic diastolic CHF (congestive heart failure), NYHA class 2 ([place]) 05/17/2014   Mixed hyperlipidemia 05/17/2014   Tobacco dependence 02/10/2014   Thyroid nodule 02/10/2014   Spinal stenosis, lumbar region without neurogenic claudication 03/05/2013   Constipation 01/29/2013   Eosinophil count raised 10/20/2012   Urinary incontinence 10/19/2012   Type 2 diabetes mellitus without complications ([place]) 22/05/5425   Neck pain 06/15/2012   Polyneuropathy 11/15/2011   Palmar fascial fibromatosis 09/13/2011   Edema 05/29/2011   Vitamin D deficiency 07/31/2009   Insomnia 07/27/2009    Past Surgical History:  Procedure Laterality Date   COLONOSCOPY WITH PROPOFOL N/A 11/24/2015   Procedure: COLONOSCOPY WITH PROPOFOL;  Surgeon: Lollie Sails, MD;  Location: [facility] ENDOSCOPY;  Service: Endoscopy;  Laterality: N/A;   COLONOSCOPY WITH PROPOFOL N/A 03/18/2019   Procedure: COLONOSCOPY WITH PROPOFOL;  Surgeon: Virgel Manifold, MD;  Location: [facility] ENDOSCOPY;  Service: Endoscopy;  Laterality: N/A;   DILATION AND CURETTAGE OF UTERUS N/A 12/01/2020   Procedure: DILATATION AND CURETTAGE with cervical biopsies;  Surgeon: Boykin Nearing,  MD;  Location: [facility] ORS;  Service: Gynecology;  Laterality: N/A;   ESOPHAGOGASTRODUODENOSCOPY (EGD) WITH PROPOFOL N/A 03/18/2019   Procedure: ESOPHAGOGASTRODUODENOSCOPY (EGD) WITH PROPOFOL;  Surgeon: Virgel Manifold, MD;  Location: [facility] ENDOSCOPY;  Service: Endoscopy;  Laterality: N/A;   PORTA CATH INSERTION N/A 01/16/2021   Procedure: PORTA CATH  INSERTION;  Surgeon: Katha Cabal, MD;  Location: [facility] CV LAB;  Service: Cardiovascular;  Laterality: N/A;   right ankle orif      Prior to Admission medications   Medication Sig Start Date End Date Taking? Authorizing Provider  cefdinir (OMNICEF) 300 MG capsule Take 1 capsule (300 mg total) by mouth 2 (two) times daily for 7 days. 02/06/21 02/13/21 Yes Duffy Bruce, MD  aspirin EC 81 MG tablet Take 81 mg by mouth daily.    [provider]  Calcium Carbonate-Vitamin D 500-125 MG-UNIT TABS Take 1 tablet by mouth daily.    [provider]  docusate sodium (COLACE) 100 MG capsule Take 100 mg by mouth daily.    [provider]  Ferrous Sulfate (IRON) 325 (65 Fe) MG TABS Take 1 tablet by mouth 2 (two) times daily. 08/26/20   [provider]  furosemide (LASIX) 20 MG tablet Take 20 mg by mouth 2 (two) times daily.    [provider]  gabapentin (NEURONTIN) 800 MG tablet Take 800 mg by mouth 2 (two) times daily. 12/20/13   [provider]  hydrOXYzine (ATARAX/VISTARIL) 25 MG tablet Take 25 mg by mouth daily. Take 25mg . By mouth every morning    [provider]  lidocaine-prilocaine (EMLA) cream Apply 1 application topically as needed.    [provider]  losartan (COZAAR) 50 MG tablet Take 50 mg by mouth 2 (two) times daily. 09/26/20   [provider]  metFORMIN (GLUCOPHAGE) 500 MG tablet Take 500 mg by mouth 2 (two) times daily with a meal.    [provider]  mirtazapine (REMERON) 15 MG tablet Take 15 mg by mouth at bedtime. 09/05/20   [provider]  ondansetron (ZOFRAN) 4 MG tablet Take 1 tablet (4 mg total) by mouth every 6 (six) hours as needed for nausea. Patient not taking: Reported on 02/01/2021 12/05/20   Schermerhorn, Gwen Her, MD  oxyCODONE (OXY IR/ROXICODONE) 5 MG immediate release tablet Take 1 tablet (5 mg total) by mouth every 8 (eight) hours as needed for moderate  pain. Patient not taking: No sig reported 12/05/20   Schermerhorn, Gwen Her, MD  simvastatin (ZOCOR) 20 MG tablet Take 20 mg by mouth daily.    [provider]  prochlorperazine (COMPAZINE) 10 MG tablet Take 1 tablet (10 mg total) by mouth every 6 (six) hours as needed (Nausea or vomiting). 01/03/21 01/03/21  Earlie Server, MD    Allergies Ace inhibitors and Cyclobenzaprine  Family History  Problem Relation Age of Onset   Breast cancer Maternal Aunt    Breast cancer Paternal Aunt     Social History Social History   Tobacco Use   Smoking status: Every Day    Packs/day: 0.10    Types: Cigarettes   Smokeless tobacco: Never  Substance Use Topics   Alcohol use: Not Currently    Review of Systems  Review of Systems  Constitutional:  Positive for fatigue. Negative for chills and fever.  HENT:  Negative for sore throat.   Respiratory:  Negative for shortness of breath.   Cardiovascular:  Negative for chest pain.  Gastrointestinal:  Negative for abdominal pain.  Genitourinary:  Negative for flank pain.  Musculoskeletal:  Negative for neck pain.  Skin:  Negative for rash and wound.  Allergic/Immunologic: Negative for immunocompromised state.  Neurological:  Positive for light-headedness. Negative for weakness and numbness.  Hematological:  Does not bruise/bleed easily.  All other systems reviewed and are negative.   ____________________________________________  PHYSICAL EXAM:      VITAL SIGNS: ED Triage Vitals  Enc Vitals Group     BP 02/06/21 1515 (!) 141/86     Pulse Rate 02/06/21 1515 74     Resp 02/06/21 1515 20     Temp 02/06/21 1515 97.6 F (36.4 C)     Temp Source 02/06/21 1515 Oral     SpO2 02/06/21 1515 98 %     Weight 02/06/21 1516 137 lb (62.1 kg)     Height 02/06/21 1516 5' 1.5" (1.562 m)     Head Circumference --      Peak Flow --      Pain Score 02/06/21 1515 4     Pain Loc --      Pain Edu? --      Excl. in [place]? --      Physical Exam Vitals and  nursing note reviewed.  Constitutional:      General: She is not in acute distress.    Appearance: She is well-developed.  HENT:     Head: Normocephalic and atraumatic.     Mouth/Throat:     Mouth: Mucous membranes are dry.  Eyes:     Conjunctiva/sclera: Conjunctivae normal.  Cardiovascular:     Rate and Rhythm: Normal rate and regular rhythm.     Heart sounds: Normal heart sounds. No murmur heard.   No friction rub.  Pulmonary:     Effort: Pulmonary effort is normal. No respiratory distress.     Breath sounds: Normal breath sounds. No wheezing or rales.  Abdominal:     General: There is no distension.     Palpations: Abdomen is soft.     Tenderness: There is no abdominal tenderness.  Musculoskeletal:     Cervical back: Neck supple.  Skin:    General: Skin is warm.     Capillary Refill: Capillary refill takes less than 2 seconds.  Neurological:     Mental Status: She is alert and oriented to person, place, and time.     Motor: No abnormal muscle tone.      ____________________________________________   LABS (all labs ordered are listed, but only abnormal results are displayed)  Labs Reviewed  BASIC METABOLIC PANEL - Abnormal; Notable for the following components:      Result Value   Creatinine, Ser 1.12 (*)    Calcium 8.4 (*)    GFR, Estimated 52 (*)    All other components within normal limits  CBC - Abnormal; Notable for the following components:   RBC 2.75 (*)    Hemoglobin 10.2 (*)    HCT 29.8 (*)    MCV 108.4 (*)    MCH 37.1 (*)    All other components within normal limits  URINALYSIS, COMPLETE (UACMP) WITH MICROSCOPIC - Abnormal; Notable for the following components:   Color, Urine YELLOW (*)    APPearance HAZY (*)    Protein, ur 30 (*)    Leukocytes,Ua MODERATE (*)    Bacteria, UA RARE (*)    All other components within normal limits  URINE CULTURE  MAGNESIUM  TROPONIN I (HIGH SENSITIVITY)  TROPONIN I (HIGH SENSITIVITY)  ____________________________________________  EKG: Normal sinus rhythm, ventricular rate 88.  PR 152, QRS 66, QTc 450.  Low-voltage QRS.  No acute ST elevations or depressions. ________________________________________  RADIOLOGY All imaging, including plain films, CT scans, and ultrasounds, independently reviewed by me, and interpretations confirmed via formal radiology reads.  ED MD interpretation:     Official radiology report(s): No results found.  ____________________________________________  PROCEDURES   Procedure(s) performed (including Critical Care):  .1-3 Lead EKG Interpretation Performed by: Duffy Bruce, MD Authorized by: Duffy Bruce, MD     Interpretation: normal     ECG rate:  80-100   ECG rate assessment: normal     Rhythm: sinus rhythm     Ectopy: none     Conduction: normal   Comments:     Indication: Near syncope  ____________________________________________  INITIAL IMPRESSION / MDM / ASSESSMENT AND PLAN / ED COURSE  As part of my medical decision making, I reviewed the following data within the [facility] notes reviewed and incorporated, Old chart reviewed, Notes from prior ED visits, and [place] Controlled Substance Database       *Rebecca Parker was evaluated in Emergency Department on 02/06/2021 for the symptoms described in the history of present illness. She was evaluated in the context of the global COVID-19 pandemic, which necessitated consideration that the patient might be at risk for infection with the SARS-CoV-2 virus that causes COVID-19. Institutional protocols and algorithms that pertain to the evaluation of patients at risk for COVID-19 are in a state of rapid change based on information released by regulatory bodies including the CDC and federal and state organizations. These policies and algorithms were followed during the patient's care in the ED.  Some ED evaluations and interventions may be delayed as a  result of limited staffing during the pandemic.*     Medical Decision Making: 70 year old female here with transient near syncope after being set up for ration treatment.  I suspect patient had combination of possible vasovagal reaction due to pain from her leg positioning versus orthostasis after sitting up from a full session of radiation.  She has had similar symptoms during her radiation sessions in the past.  She reports that she also took all of her medications prior to radiation without necessarily eating much.  She reports that she now feels back to baseline.  She had no chest pain or palpitations.  EKG is nonischemic.  Lab work is very reassuring with no evidence of significant anemia.  Her electrolytes are at baseline.  Magnesium normal.  Troponin negative.  No signs of ACS on EKG and telemetry shows no evidence of arrhythmia.  She feels back to her baseline.  She does have some urinary frequency which I suspect is due to a combination of her chemo as well as possible steroid effect from her Decadron use.  She has a history of diabetes but glucose is normal.  Will check urinalysis, plan to likely discharge now that she is asymptomatic after period of prolonged monitoring in the ED.  Urinalysis does show possible UTI.  She is not neutropenic.  No signs of sepsis.  She will be treated with antibiotics and discharge as she is otherwise well-appearing.  No recurrence of syncope here.  No ectopy or abnormality noted on telemetry.  I instructed her to call her oncologist to notify her of her visit.  ____________________________________________  FINAL CLINICAL IMPRESSION(S) / ED DIAGNOSES  Final diagnoses:  Syncope, unspecified syncope type  Cystitis without hematuria  MEDICATIONS GIVEN DURING THIS VISIT:  Medications  lactated ringers bolus 500 mL (0 mLs Intravenous Stopped 02/06/21 2055)  cefTRIAXone (ROCEPHIN) 1 g in sodium chloride 0.9 % 100 mL IVPB (0 g Intravenous Stopped 02/06/21  2134)  acetaminophen (TYLENOL) tablet 1,000 mg (1,000 mg Oral Given 02/06/21 2104)     ED Discharge Orders          Ordered    cefdinir (OMNICEF) 300 MG capsule  2 times daily        02/06/21 2102             Note:  This document was prepared using Dragon voice recognition software and may include unintentional dictation errors.   Duffy Bruce, MD 02/06/21 2211

## 2021-02-06 NOTE — ED Notes (Addendum)
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 Provider notified.

## 2021-02-06 NOTE — ED Provider Notes (Signed)
Emergency Medicine Provider Triage Evaluation Note  Rebecca Parker , a 70 y.o. female  was evaluated in triage.  Pt complains of weakness/syncope.  Patient was receiving radiation plan she states her leg started to hurt and she kept telling them she needed to sit down.  They state that she became hypotensive..  Review of Systems  Positive: Syncope Negative: Denies chest pain or shortness of breath  Physical Exam  BP (!) 141/86   Pulse 74   Temp 97.6 F (36.4 C) (Oral)   Resp 20   Ht 5' 1.5" (1.562 m)   Wt 62.1 kg   SpO2 98%   BMI 25.47 kg/m  Gen:   Awake, no distress   Resp:  Normal effort  MSK:   Moves extremities without difficulty  Other:  Appears to be neurologically intact.  Medical Decision Making  Medically screening exam initiated at 3:43 PM.  Appropriate orders placed.  Rebecca Parker was informed that the remainder of the evaluation will be completed by another provider, this initial triage assessment does not replace that evaluation, and the importance of remaining in the ED until their evaluation is complete.  Patient does states she is feeling much better now.  We will still await labs which will need to be drawn from her port.   Versie Starks, PA-C 02/06/21 1600    Carrie Mew, MD 02/06/21 1836

## 2021-02-06 NOTE — ED Triage Notes (Signed)
See first nurse note, pt reports laying down receiving radiation when felt like she was going to pass out.  Pt alert and oriented.

## 2021-02-06 NOTE — Discharge Instructions (Addendum)
As we discussed, make sure you tell your radiation therapists to (A) minimize trauma to your leg when getting treated, (B) be VERY cautious when sitting up after treatment, taking time to sit up slowly with support.  Drink AT LEAST 8 glasses of water daily  Make sure you take the antibiotic as prescribed

## 2021-02-06 NOTE — ED Notes (Signed)
Pt waiting on port access for blood work

## 2021-02-06 NOTE — ED Triage Notes (Signed)
Pt brought over from cancer center. Was receiving radiation and had syncopal episode when finishing. Pt was lethargic, hypotensive 82/50. On arrival to ed pt more alert and verbal in lobby. Normally a&o x 4.

## 2021-02-07 ENCOUNTER — Ambulatory Visit: Payer: Medicare Other

## 2021-02-07 ENCOUNTER — Ambulatory Visit
Admission: RE | Admit: 2021-02-07 | Discharge: 2021-02-07 | Disposition: A | Discharge status: Home / Self Care | Payer: Medicare Other | Source: Ambulatory Visit | Attending: Radiation Oncology | Admitting: Radiation Oncology

## 2021-02-07 ENCOUNTER — Inpatient Hospital Stay: Payer: Medicare Other

## 2021-02-07 DIAGNOSIS — C539 Malignant neoplasm of cervix uteri, unspecified: Secondary | ICD-10-CM | POA: Diagnosis not present

## 2021-02-08 ENCOUNTER — Inpatient Hospital Stay: Payer: Medicare Other

## 2021-02-08 ENCOUNTER — Inpatient Hospital Stay (HOSPITAL_BASED_OUTPATIENT_CLINIC_OR_DEPARTMENT_OTHER): Payer: Medicare Other | Admitting: Oncology

## 2021-02-08 ENCOUNTER — Other Ambulatory Visit: Payer: Self-pay

## 2021-02-08 ENCOUNTER — Encounter: Payer: Self-pay | Admitting: Oncology

## 2021-02-08 ENCOUNTER — Ambulatory Visit
Admission: RE | Admit: 2021-02-08 | Discharge: 2021-02-08 | Disposition: A | Discharge status: Home / Self Care | Payer: Medicare Other | Source: Ambulatory Visit | Attending: Radiation Oncology | Admitting: Radiation Oncology

## 2021-02-08 VITALS — BP 170/99 | HR 88 | Temp 98.2°F | Wt 140.0 lb

## 2021-02-08 DIAGNOSIS — D649 Anemia, unspecified: Secondary | ICD-10-CM

## 2021-02-08 DIAGNOSIS — C539 Malignant neoplasm of cervix uteri, unspecified: Secondary | ICD-10-CM

## 2021-02-08 DIAGNOSIS — Z5111 Encounter for antineoplastic chemotherapy: Secondary | ICD-10-CM | POA: Diagnosis not present

## 2021-02-08 DIAGNOSIS — D518 Other vitamin B12 deficiency anemias: Secondary | ICD-10-CM

## 2021-02-08 DIAGNOSIS — Z95828 Presence of other vascular implants and grafts: Secondary | ICD-10-CM

## 2021-02-08 DIAGNOSIS — R948 Abnormal results of function studies of other organs and systems: Secondary | ICD-10-CM | POA: Diagnosis not present

## 2021-02-08 LAB — CBC WITH DIFFERENTIAL/PLATELET
Abs Immature Granulocytes: 0.01 10*3/uL (ref 0.00–0.07)
Basophils Absolute: 0 10*3/uL (ref 0.0–0.1)
Basophils Relative: 0 %
Eosinophils Absolute: 0.6 10*3/uL — ABNORMAL HIGH (ref 0.0–0.5)
Eosinophils Relative: 14 %
HCT: 28.5 % — ABNORMAL LOW (ref 36.0–46.0)
Hemoglobin: 9.6 g/dL — ABNORMAL LOW (ref 12.0–15.0)
Immature Granulocytes: 0 %
Lymphocytes Relative: 14 %
Lymphs Abs: 0.7 10*3/uL (ref 0.7–4.0)
MCH: 36.5 pg — ABNORMAL HIGH (ref 26.0–34.0)
MCHC: 33.7 g/dL (ref 30.0–36.0)
MCV: 108.4 fL — ABNORMAL HIGH (ref 80.0–100.0)
Monocytes Absolute: 0.4 10*3/uL (ref 0.1–1.0)
Monocytes Relative: 9 %
Neutro Abs: 2.9 10*3/uL (ref 1.7–7.7)
Neutrophils Relative %: 63 %
Platelets: 197 10*3/uL (ref 150–400)
RBC: 2.63 MIL/uL — ABNORMAL LOW (ref 3.87–5.11)
RDW: 14 % (ref 11.5–15.5)
WBC: 4.7 10*3/uL (ref 4.0–10.5)
nRBC: 0 % (ref 0.0–0.2)

## 2021-02-08 LAB — COMPREHENSIVE METABOLIC PANEL
ALT: 12 U/L (ref 0–44)
AST: 16 U/L (ref 15–41)
Albumin: 3.2 g/dL — ABNORMAL LOW (ref 3.5–5.0)
Alkaline Phosphatase: 47 U/L (ref 38–126)
Anion gap: 9 (ref 5–15)
BUN: 8 mg/dL (ref 8–23)
CO2: 25 mmol/L (ref 22–32)
Calcium: 8.6 mg/dL — ABNORMAL LOW (ref 8.9–10.3)
Chloride: 102 mmol/L (ref 98–111)
Creatinine, Ser: 0.99 mg/dL (ref 0.44–1.00)
GFR, Estimated: >60 mL/min (ref >60)
Glucose, Bld: 98 mg/dL (ref 70–99)
Potassium: 3.6 mmol/L (ref 3.5–5.1)
Sodium: 136 mmol/L (ref 135–145)
Total Bilirubin: <0.1 mg/dL — ABNORMAL LOW (ref 0.3–1.2)
Total Protein: 6.5 g/dL (ref 6.5–8.1)

## 2021-02-08 LAB — URINE CULTURE: Culture: NO GROWTH

## 2021-02-08 MED ORDER — HEPARIN SOD (PORK) LOCK FLUSH 100 UNIT/ML IV SOLN
500.0000 [IU] | Freq: Once | INTRAVENOUS | Status: AC
  Filled 2021-02-08: qty 5

## 2021-02-08 MED ORDER — SODIUM CHLORIDE 0.9 % IV SOLN
10.0000 mg | Freq: Once | INTRAVENOUS | Status: AC
  Administered 2021-02-08: 10 mg via INTRAVENOUS
  Filled 2021-02-08: qty 10

## 2021-02-08 MED ORDER — SODIUM CHLORIDE 0.9 % IV SOLN
150.0000 mg | Freq: Once | INTRAVENOUS | Status: AC
  Administered 2021-02-08: 150 mg via INTRAVENOUS
  Filled 2021-02-08: qty 150

## 2021-02-08 MED ORDER — HEPARIN SOD (PORK) LOCK FLUSH 100 UNIT/ML IV SOLN
INTRAVENOUS | Status: AC
  Administered 2021-02-08: 500 [IU] via INTRAVENOUS
  Filled 2021-02-08: qty 5

## 2021-02-08 MED ORDER — SODIUM CHLORIDE 0.9% FLUSH
10.0000 mL | Freq: Once | INTRAVENOUS | Status: AC
  Administered 2021-02-08: 10 mL via INTRAVENOUS
  Filled 2021-02-08: qty 10

## 2021-02-08 MED ORDER — SODIUM CHLORIDE 0.9 % IV SOLN
Freq: Once | INTRAVENOUS | Status: AC
Start: 2021-02-08 — End: 2021-02-08
  Filled 2021-02-08: qty 250

## 2021-02-08 MED ORDER — PALONOSETRON HCL INJECTION 0.25 MG/5ML
0.2500 mg | Freq: Once | INTRAVENOUS | Status: AC
  Administered 2021-02-08: 0.25 mg via INTRAVENOUS
  Filled 2021-02-08: qty 5

## 2021-02-08 MED ORDER — SODIUM CHLORIDE 0.9 % IV SOLN
30.0000 mg/m2 | Freq: Once | INTRAVENOUS | Status: AC
  Administered 2021-02-08: 50 mg via INTRAVENOUS
  Filled 2021-02-08: qty 50

## 2021-02-08 MED ORDER — POTASSIUM CHLORIDE IN NACL 20-0.9 MEQ/L-% IV SOLN
Freq: Once | INTRAVENOUS | Status: AC
  Filled 2021-02-08: qty 1000

## 2021-02-08 MED ORDER — CYANOCOBALAMIN 1000 MCG/ML IJ SOLN
1000.0000 ug | Freq: Once | INTRAMUSCULAR | Status: AC
  Administered 2021-02-08: 1000 ug via INTRAMUSCULAR
  Filled 2021-02-08: qty 1

## 2021-02-08 MED ORDER — HEPARIN SOD (PORK) LOCK FLUSH 100 UNIT/ML IV SOLN
500.0000 [IU] | Freq: Once | INTRAVENOUS | Status: DC | PRN
  Filled 2021-02-08: qty 5

## 2021-02-08 MED ORDER — MAGNESIUM SULFATE 2 GM/50ML IV SOLN
2.0000 g | Freq: Once | INTRAVENOUS | Status: AC
  Administered 2021-02-08: 2 g via INTRAVENOUS
  Filled 2021-02-08: qty 50

## 2021-02-08 NOTE — Progress Notes (Signed)
Hematology/Oncology progress note [facility] Telephone:([phone] [phone]   Patient Care Team: Center, [facility] as PCP - General (General Practice) Clent Jacks, RN as Oncology Nurse Navigator Earlie Server, MD as Consulting Physician (Oncology) Earlie Server, MD as Consulting Physician (Hematology and Oncology)  REFERRING PROVIDER: Center, [place] VISIT:  Follow up for treatment of cervical cancer  HISTORY OF PRESENTING ILLNESS:   Rebecca Parker is a  70 y.o.  female with PMH listed below was seen in consultation at the request of  Center, Jenny Reichmann*  for evaluation of cervical cancer   12/01/2020-8/9/022 She was admitted from the ED for 1 day h/o right lower abdominal / pelvic pain . 12/01/2020 CT abdomen pelvis w contrast showed  thickly septated right ovarian mass measuring 5.1 x 5.1 cm. 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 Recommend initial pelvic ultrasound and likely subsequent MRI to further evaluate, which may be performed on a nonemergent basis. 2. Air within the fundal endometrial cavity. Correlate for recent instrumentation. 3. Pancolonic diverticulosis without evidence of acute diverticulitis.  12/01/2020 Fx D+C  and cervical bx. Uterus sounded to 9 cm.   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   Diastolic CHF and Diabetes.  UDS + for cocaine.   She is the oldest of 7 kids (5 girls and 2 boys). She has 3 children - all SVDs - 2 boys and one girl.  She was seen by GynOnc Dr.Secord.  Her pelvic examination showed On palpations suspect lateral vaginal disease >50% down  the vault and disease involving the left fornix. Cervix enlarged >4 cm with grossly obvious tumor and hard to palpation. Uterus - may be enlarged with firm mass on the right aspect versus palpation of the right adnexal mass. Positive parametrial involvement on the right and on the left with disease to or almost to the sidewall on the left. Rectovaginal exam was confirmatory.    # 8/31 2022, PET scan showed signs of cervical cancer with diffuse involvement of the uterus.  Cystic and solid right ovarian lesion more likely related to diffuse cervical cancer.  Concomitant synchronous cystic ovarian neoplasm could have appearance but feel less likely given the diffuse nature of disease throughout the uterus and cervix.  Small lymph nodes in the pelvis in the left pelvis suspicious for nodal involvement at the common iliac level.  No solid organ distant metastasis. Right lung renal consult and glossotonsillar sulcus uptake which is asymmetric and with increased fullness.  While this may be physiologic, will suggest direct visualization for further evaluation to exclude neoplasm. Right thyroid uptake with visible nodule.  Recommend ultrasound thyroid and biopsy.  Patient has had baseline audiogram done and chemotherapy class. 01/03/2021 cisplatin 20 mg/m2 today.  Dose were reduced to 50% due to her impaired kidney function.  Treatment plan was switched to carboplatin given her decreased kidney function 01/10/2021 carboplatin. Kidney function improved. 01/17/2021 cisplatin.   PD-L1 CPS 20%  INTERVAL HISTORY Rebecca Parker is a 70 y.o. female who has above history reviewed by me today presents for follow up visit for treatment of cervix cancer. Problems and complaints are listed below: She is on  concurrent chemo-RT She is a poor historian. She is here by herself. She had a syncope event on 02/06/2021 and sent to ER. ER workup negative except UA positive for leukocyte and she was started on cipro. Urine culture  eventually came back no growth.  Patient says " the tech rushed me to much when I finished RT" " I had worsening left leg pain, numbness and felt lightheaded when I sat up". She has decreased appetite.  Today she denies any new complaints. No fever, dysuria, nausea, vomiting, diarrhea. She denies lightheaded, dizzy today.   Review of Systems  Constitutional:  Positive for fatigue. Negative for appetite change, chills and fever.  HENT:   Negative for hearing loss and voice change.   Eyes:  Negative for eye problems.  Respiratory:  Negative for chest tightness and cough.   Cardiovascular:  Negative for chest pain.  Gastrointestinal:  Negative for abdominal distention, abdominal pain and blood in stool.  Endocrine: Negative for hot flashes.  Genitourinary:  Negative for difficulty urinating and frequency.   Musculoskeletal:  Negative for arthralgias.       Chronic intermitted left leg pain  Skin:  Negative for itching and rash.  Neurological:  Positive for numbness. Negative for extremity weakness and light-headedness.  Hematological:  Negative for adenopathy.  Psychiatric/Behavioral:  Negative for confusion.    MEDICAL HISTORY:  Past Medical History:  Diagnosis Date   B12 deficiency anemia 01/03/2021   Chest pain, unspecified    CHF (congestive heart failure) ([place])    Diabetes mellitus without complication (HCC)    Hip pain    Hypertension    Multinodular goiter     SURGICAL HISTORY: Past Surgical History:  Procedure Laterality Date   COLONOSCOPY WITH PROPOFOL N/A 11/24/2015   Procedure: COLONOSCOPY WITH PROPOFOL;  Surgeon: Lollie Sails, MD;  Location: [facility] ENDOSCOPY;  Service: Endoscopy;  Laterality: N/A;   COLONOSCOPY WITH PROPOFOL N/A 03/18/2019   Procedure: COLONOSCOPY WITH PROPOFOL;  Surgeon: Virgel Manifold, MD;  Location: [facility] ENDOSCOPY;  Service: Endoscopy;  Laterality: N/A;   DILATION AND CURETTAGE OF UTERUS N/A 12/01/2020   Procedure: DILATATION AND CURETTAGE with  cervical biopsies;  Surgeon: Schermerhorn, Gwen Her, MD;  Location: [facility] ORS;  Service: Gynecology;  Laterality: N/A;   ESOPHAGOGASTRODUODENOSCOPY (EGD) WITH PROPOFOL N/A 03/18/2019   Procedure: ESOPHAGOGASTRODUODENOSCOPY (EGD) WITH PROPOFOL;  Surgeon: Virgel Manifold, MD;  Location: [facility] ENDOSCOPY;  Service: Endoscopy;  Laterality: N/A;   PORTA CATH INSERTION N/A 01/16/2021   Procedure: PORTA CATH INSERTION;  Surgeon: Katha Cabal, MD;  Location: [place] CV LAB;  Service: Cardiovascular;  Laterality: N/A;   right ankle orif      SOCIAL HISTORY: Social History   Socioeconomic History   Marital status: Legally Separated    Spouse name: Not on file   Number of children: Not on file   Years of education: Not on file   Highest education level: Not on file  Occupational History   Not on file  Tobacco Use   Smoking status: Every Day    Packs/day: 0.10    Types: Cigarettes   Smokeless tobacco: Never  Substance and Sexual Activity   Alcohol use: Not Currently   Drug use: Not on file   Sexual activity: Not Currently  Other Topics Concern   Not on file  Social History Narrative   Not on file   Social Determinants of Health   Financial Resource Strain: Not on file  Food Insecurity: Not on file  Transportation Needs: Not on file  Physical Activity: Not on file  Stress: Not on file  Social Connections: Not on file  Intimate Partner Violence: Not on file    FAMILY HISTORY: Family History  Problem Relation Age of Onset   Breast cancer Maternal Aunt    Breast cancer Paternal Aunt     ALLERGIES:  is allergic to ace inhibitors and cyclobenzaprine.  MEDICATIONS:  Current Outpatient Medications  Medication Sig Dispense Refill   aspirin EC 81 MG tablet Take 81 mg by mouth daily.     Calcium Carbonate-Vitamin D 500-125 MG-UNIT TABS Take 1 tablet by mouth daily.     cefdinir (OMNICEF) 300 MG capsule Take 1 capsule (300 mg total) by mouth 2 (two) times daily for 7  days. 14 capsule 0   docusate sodium (COLACE) 100 MG capsule Take 100 mg by mouth daily.     Ferrous Sulfate (IRON) 325 (65 Fe) MG TABS Take 1 tablet by mouth 2 (two) times daily.     furosemide (LASIX) 20 MG tablet Take 20 mg by mouth 2 (two) times daily.     gabapentin (NEURONTIN) 800 MG tablet Take 800 mg by mouth 2 (two) times daily.     hydrOXYzine (ATARAX/VISTARIL) 25 MG tablet Take 25 mg by mouth daily. Take $RemoveBef'25mg'BsfYxfaAEH$ . By mouth every morning     lidocaine-prilocaine (EMLA) cream Apply 1 application topically as needed.     losartan (COZAAR) 50 MG tablet Take 50 mg by mouth 2 (two) times daily.     metFORMIN (GLUCOPHAGE) 500 MG tablet Take 500 mg by mouth 2 (two) times daily with a meal.     mirtazapine (REMERON) 15 MG tablet Take 15 mg by mouth at bedtime.     simvastatin (ZOCOR) 20 MG tablet Take 20 mg by mouth daily.     ondansetron (ZOFRAN) 4 MG tablet Take 1 tablet (4 mg total) by mouth every 6 (six) hours as needed for nausea. (Patient not taking: No sig reported) 20 tablet 0   oxyCODONE (OXY IR/ROXICODONE) 5 MG immediate release tablet Take 1 tablet (5 mg total) by mouth every 8 (eight) hours as needed for moderate pain. (Patient not taking: No sig reported) 15 tablet 0   No current facility-administered medications for this visit.   Facility-Administered Medications Ordered in Other Visits  Medication Dose Route Frequency Provider Last Rate Last Admin   heparin lock flush 100 unit/mL  500 Units Intracatheter Once PRN Earlie Server, MD         PHYSICAL EXAMINATION: ECOG PERFORMANCE STATUS: 2 - Symptomatic, <50% confined to bed Vitals:   02/08/21 0850 02/08/21 0912  BP: (!) 166/101 (!) 170/99  Pulse: 93 88  Temp: 98.2 F (36.8 C)   SpO2: 98%    Filed Weights   02/08/21 0850  Weight: 140 lb (63.5 kg)    Physical Exam Constitutional:      General: She is not in acute distress.    Comments: She is able to ambulate with a walker  HENT:     Head: Normocephalic and atraumatic.   Eyes:     General: No scleral icterus. Cardiovascular:     Rate and Rhythm: Normal rate and regular rhythm.     Heart sounds: Normal heart sounds.  Pulmonary:     Effort: Pulmonary effort is normal. No respiratory distress.     Breath sounds: No wheezing.  Abdominal:     General: Bowel sounds are normal. There is no distension.  Palpations: Abdomen is soft.  Musculoskeletal:        General: No deformity. Normal range of motion.     Cervical back: Normal range of motion and neck supple.  Skin:    General: Skin is warm and dry.     Findings: No erythema or rash.  Neurological:     Mental Status: She is alert and oriented to person, place, and time. Mental status is at baseline.     Cranial Nerves: No cranial nerve deficit.     Coordination: Coordination normal.  Psychiatric:        Mood and Affect: Mood normal.    LABORATORY DATA:  I have reviewed the data as listed Lab Results  Component Value Date   WBC 4.7 02/08/2021   HGB 9.6 (L) 02/08/2021   HCT 28.5 (L) 02/08/2021   MCV 108.4 (H) 02/08/2021   PLT 197 02/08/2021   Recent Labs    01/24/21 0832 02/01/21 0830 02/06/21 1858 02/08/21 0816  NA 139 138 139 136  K 3.7 3.8 3.5 3.6  CL 107 105 104 102  CO2 $Re'27 26 27 25  'cut$ GLUCOSE 95 99 70 98  BUN 6* $Remo'12 10 8  'iKAdz$ CREATININE 0.87 1.01* 1.12* 0.99  CALCIUM 8.4* 8.5* 8.4* 8.6*  GFRNONAA >60 59* 52* >60  PROT 6.5 6.7  --  6.5  ALBUMIN 3.0* 3.1*  --  3.2*  AST 16 17  --  16  ALT 9 10  --  12  ALKPHOS 51 49  --  47  BILITOT 0.4 0.5  --  <0.1*    Iron/TIBC/Ferritin/ %Sat    Component Value Date/Time   IRON 85 12/26/2020 0846   IRON 90 02/24/2019 1550   TIBC 204 (L) 12/26/2020 0846   TIBC 292 02/24/2019 1550   FERRITIN 25 12/26/2020 0846   FERRITIN 33 02/24/2019 1550   IRONPCTSAT 42 (H) 12/26/2020 0846   IRONPCTSAT 31 02/24/2019 1550       RADIOGRAPHIC STUDIES: I have personally reviewed the radiological images as listed and agreed with the findings in the  report. PERIPHERAL VASCULAR CATHETERIZATION  Result Date: 01/16/2021 See surgical note for result.     ASSESSMENT & PLAN:  1. Encounter for antineoplastic chemotherapy   2. 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[place])   3. Abnormal positron emission tomography (PET) scan   4. Normocytic anemia   5. Other vitamin B12 deficiency anemia    #Locally advanced cervical cancer.  Stage III Not a surgical candidate. Currently on concurrent chemotherapy and radiation.  Labs are reviewed and discussed with patient. Proceed with cisplatin $RemoveBefore'30mg'uZvpfjLOWavKO$ /m2- she finished RT today and this will be her last chemotherapy.   Porta Cath in place due to lack of IV access.   # Anemia, stable hemoglobin # Vitamin B12 deficency.  B12 today and repeat in 1 week, than monthly x 6.   #Abnormal PET scan with tonsil uptake. I have referred her to ENT. Unclear if she has been seen or not.   All questions were answered. The patient knows to call the clinic with any problems questions or concerns.  cc Center, [place]    Return of visit: 2 weeks, lab MD IVF  Earlie Server, MD, PhD 02/08/2021

## 2021-02-08 NOTE — Progress Notes (Signed)
Vitals reviewed with MD and treatment team. Per MD to proceed with treatment.   Rebecca Parker [employer]

## 2021-02-08 NOTE — Patient Instructions (Signed)
[facility]  Discharge Instructions: Thank you for choosing [facility] to provide your oncology and hematology care.  If you have a lab appointment with the Cancer Center, please go directly to the Cancer Center and check in at the registration area.  Wear comfortable clothing and clothing appropriate for easy access to any Portacath or PICC line.   We strive to give you quality time with your provider. You may need to reschedule your appointment if you arrive late (15 or more minutes).  Arriving late affects you and other patients whose appointments are after yours.  Also, if you miss three or more appointments without notifying the office, you may be dismissed from the clinic at the provider's discretion.      For prescription refill requests, have your pharmacy contact our office and allow 72 hours for refills to be completed.    Today you received the following chemotherapy and/or immunotherapy agents : Cisplatin    To help prevent nausea and vomiting after your treatment, we encourage you to take your nausea medication as directed.  BELOW ARE SYMPTOMS THAT SHOULD BE REPORTED IMMEDIATELY: *FEVER GREATER THAN 100.4 F (38 C) OR HIGHER *CHILLS OR SWEATING *NAUSEA AND VOMITING THAT IS NOT CONTROLLED WITH YOUR NAUSEA MEDICATION *UNUSUAL SHORTNESS OF BREATH *UNUSUAL BRUISING OR BLEEDING *URINARY PROBLEMS (pain or burning when urinating, or frequent urination) *BOWEL PROBLEMS (unusual diarrhea, constipation, pain near the anus) TENDERNESS IN MOUTH AND THROAT WITH OR WITHOUT PRESENCE OF ULCERS (sore throat, sores in mouth, or a toothache) UNUSUAL RASH, SWELLING OR PAIN  UNUSUAL VAGINAL DISCHARGE OR ITCHING   Items with * indicate a potential emergency and should be followed up as soon as possible or go to the Emergency Department if any problems should occur.  Please show the CHEMOTHERAPY ALERT CARD or IMMUNOTHERAPY ALERT CARD at check-in  to the Emergency Department and triage nurse.  Should you have questions after your visit or need to cancel or reschedule your appointment, please contact [facility] [place] [facility]  [phone] and follow the prompts.  Office hours are 8:00 a.m. to 4:30 p.m. Monday - Friday. Please note that voicemails left after 4:00 p.m. may not be returned until the following business day.  We are closed weekends and major holidays. You have access to a nurse at all times for urgent questions. Please call the main number to the clinic [phone] and follow the prompts.  For any non-urgent questions, you may also contact your provider using MyChart. We now offer e-Visits for anyone 18 and older to request care online for non-urgent symptoms. For details visit [email or web address].   Also download the MyChart app! Go to the app store, search "MyChart", open the app, select [facility], and log in with your MyChart username and password.  Due to Covid, a mask is required upon entering the hospital/clinic. If you do not have a mask, one will be given to you upon arrival. For doctor visits, patients may have 1 support person aged 18 or older with them. For treatment visits, patients cannot have anyone with them due to current Covid guidelines and our immunocompromised population.  

## 2021-02-12 ENCOUNTER — Other Ambulatory Visit: Payer: Self-pay | Admitting: Otolaryngology

## 2021-02-12 DIAGNOSIS — R946 Abnormal results of thyroid function studies: Secondary | ICD-10-CM

## 2021-02-16 ENCOUNTER — Inpatient Hospital Stay: Payer: Medicare Other

## 2021-02-20 ENCOUNTER — Other Ambulatory Visit: Payer: Self-pay

## 2021-02-20 ENCOUNTER — Ambulatory Visit
Admission: RE | Admit: 2021-02-20 | Discharge: 2021-02-20 | Disposition: A | Discharge status: Home / Self Care | Payer: Medicare Other | Source: Ambulatory Visit | Attending: Otolaryngology | Admitting: Otolaryngology

## 2021-02-20 DIAGNOSIS — R946 Abnormal results of thyroid function studies: Secondary | ICD-10-CM

## 2021-02-21 ENCOUNTER — Other Ambulatory Visit: Payer: Self-pay | Admitting: Otolaryngology

## 2021-02-21 DIAGNOSIS — E041 Nontoxic single thyroid nodule: Secondary | ICD-10-CM

## 2021-02-22 ENCOUNTER — Inpatient Hospital Stay: Payer: Medicare Other

## 2021-02-22 ENCOUNTER — Other Ambulatory Visit: Payer: Self-pay

## 2021-02-22 ENCOUNTER — Inpatient Hospital Stay (HOSPITAL_BASED_OUTPATIENT_CLINIC_OR_DEPARTMENT_OTHER): Payer: Medicare Other | Admitting: Oncology

## 2021-02-22 ENCOUNTER — Other Ambulatory Visit: Payer: Self-pay | Admitting: Otolaryngology

## 2021-02-22 ENCOUNTER — Encounter: Payer: Self-pay | Admitting: Oncology

## 2021-02-22 VITALS — BP 172/111 | HR 91 | Temp 96.0°F | Resp 17 | Wt 137.0 lb

## 2021-02-22 DIAGNOSIS — Z5111 Encounter for antineoplastic chemotherapy: Secondary | ICD-10-CM

## 2021-02-22 DIAGNOSIS — E041 Nontoxic single thyroid nodule: Secondary | ICD-10-CM | POA: Diagnosis not present

## 2021-02-22 DIAGNOSIS — C539 Malignant neoplasm of cervix uteri, unspecified: Secondary | ICD-10-CM

## 2021-02-22 DIAGNOSIS — D518 Other vitamin B12 deficiency anemias: Secondary | ICD-10-CM | POA: Diagnosis not present

## 2021-02-22 DIAGNOSIS — Z95828 Presence of other vascular implants and grafts: Secondary | ICD-10-CM

## 2021-02-22 LAB — COMPREHENSIVE METABOLIC PANEL
ALT: 15 U/L (ref 0–44)
AST: 17 U/L (ref 15–41)
Albumin: 3.3 g/dL — ABNORMAL LOW (ref 3.5–5.0)
Alkaline Phosphatase: 54 U/L (ref 38–126)
Anion gap: 6 (ref 5–15)
BUN: 12 mg/dL (ref 8–23)
CO2: 29 mmol/L (ref 22–32)
Calcium: 8.9 mg/dL (ref 8.9–10.3)
Chloride: 103 mmol/L (ref 98–111)
Creatinine, Ser: 0.95 mg/dL (ref 0.44–1.00)
GFR, Estimated: >60 mL/min (ref >60)
Glucose, Bld: 92 mg/dL (ref 70–99)
Potassium: 4.1 mmol/L (ref 3.5–5.1)
Sodium: 138 mmol/L (ref 135–145)
Total Bilirubin: 0.4 mg/dL (ref 0.3–1.2)
Total Protein: 6.6 g/dL (ref 6.5–8.1)

## 2021-02-22 LAB — CBC WITH DIFFERENTIAL/PLATELET
Abs Immature Granulocytes: 0.01 10*3/uL (ref 0.00–0.07)
Basophils Absolute: 0 10*3/uL (ref 0.0–0.1)
Basophils Relative: 0 %
Eosinophils Absolute: 0.2 10*3/uL (ref 0.0–0.5)
Eosinophils Relative: 6 %
HCT: 30.8 % — ABNORMAL LOW (ref 36.0–46.0)
Hemoglobin: 10.6 g/dL — ABNORMAL LOW (ref 12.0–15.0)
Immature Granulocytes: 0 %
Lymphocytes Relative: 25 %
Lymphs Abs: 0.7 10*3/uL (ref 0.7–4.0)
MCH: 36.8 pg — ABNORMAL HIGH (ref 26.0–34.0)
MCHC: 34.4 g/dL (ref 30.0–36.0)
MCV: 106.9 fL — ABNORMAL HIGH (ref 80.0–100.0)
Monocytes Absolute: 0.3 10*3/uL (ref 0.1–1.0)
Monocytes Relative: 10 %
Neutro Abs: 1.6 10*3/uL — ABNORMAL LOW (ref 1.7–7.7)
Neutrophils Relative %: 59 %
Platelets: 166 10*3/uL (ref 150–400)
RBC: 2.88 MIL/uL — ABNORMAL LOW (ref 3.87–5.11)
RDW: 14.5 % (ref 11.5–15.5)
WBC: 2.8 10*3/uL — ABNORMAL LOW (ref 4.0–10.5)
nRBC: 0 % (ref 0.0–0.2)

## 2021-02-22 MED ORDER — HEPARIN SOD (PORK) LOCK FLUSH 100 UNIT/ML IV SOLN
500.0000 [IU] | Freq: Once | INTRAVENOUS | Status: AC
  Administered 2021-02-22: 500 [IU] via INTRAVENOUS
  Filled 2021-02-22: qty 5

## 2021-02-22 MED ORDER — SODIUM CHLORIDE 0.9 % IV SOLN
Freq: Once | INTRAVENOUS | Status: AC
  Filled 2021-02-22: qty 250

## 2021-02-22 MED ORDER — INFLUENZA VAC A&B SA ADJ QUAD 0.5 ML IM PRSY
0.5000 mL | PREFILLED_SYRINGE | Freq: Once | INTRAMUSCULAR | Status: AC
  Administered 2021-02-22: 0.5 mL via INTRAMUSCULAR
  Filled 2021-02-22: qty 0.5

## 2021-02-22 MED ORDER — CYANOCOBALAMIN 1000 MCG/ML IJ SOLN
1000.0000 ug | INTRAMUSCULAR | Status: DC
  Administered 2021-02-22: 1000 ug via INTRAMUSCULAR
  Filled 2021-02-22: qty 1

## 2021-02-22 MED ORDER — SODIUM CHLORIDE 0.9% FLUSH
10.0000 mL | Freq: Once | INTRAVENOUS | Status: AC
  Administered 2021-02-22: 10 mL via INTRAVENOUS
  Filled 2021-02-22: qty 10

## 2021-02-22 NOTE — Progress Notes (Signed)
Patient tolerated IVF infusion well, no concerns voiced.  Patient also received Vitamin B & Flu injection today. Patient discharged, stable.

## 2021-02-22 NOTE — Patient Instructions (Addendum)
Dehydration, Adult Dehydration is a condition in which there is not enough water or other fluids in the body. This happens when a person loses more fluids than he or she takes in. Important organs, such as the kidneys, brain, and heart, cannot function without a proper amount of fluids.  Follow these instructions at home: Oral rehydration solution Eating and drinking Drink enough clear fluid to keep your urine pale yellow. If you were told to drink an ORS, finish the ORS first and then start slowly drinking other clear fluids. Drink fluids such as: Water. Do not drink only water. Doing that can lead to hyponatremia, which is having too little salt (sodium) in the body. Water from ice chips you suck on. Fruit juice that you have added water to (diluted fruit juice). Low-calorie sports drinks. Eat foods that contain a healthy balance of electrolytes, such as bananas, oranges, potatoes, tomatoes, and spinach. Do not drink alcohol. Avoid the following: Drinks that contain a lot of sugar. These include high-calorie sports drinks, fruit juice that is not diluted, and soda. Caffeine. Foods that are greasy or contain a lot of fat or sugar. General instructions Take over-the-counter and prescription medicines only as told by your health care provider. Do not take sodium tablets. Doing that can lead to having too much sodium in the body (hypernatremia). Return to your normal activities as told by your health care provider. Ask your health care provider what activities are safe for you. Keep all follow-up visits as told by your health care provider. This is important. Contact a health care provider if: You have muscle cramps, pain, or discomfort, such as: Pain in your abdomen and the pain gets worse or stays in one area (localizes). Stiff neck. You have a rash. You are more irritable than usual. You are sleepier or have a harder time waking than usual. You feel weak or dizzy. You feel very  thirsty. Get help right away if you have: Any symptoms of severe dehydration. Symptoms of vomiting, such as: You cannot eat or drink without vomiting. Vomiting gets worse or does not go away. Vomit includes blood or green matter (bile). Symptoms that get worse with treatment. A fever. A severe headache. Problems with urination or bowel movements, such as: Diarrhea that gets worse or does not go away. Blood in your stool (feces). This may cause stool to look black and tarry. Not urinating, or urinating only a small amount of very dark urine, within 6-8 hours. Trouble breathing. These symptoms may represent a serious problem that is an emergency. Do not wait to see if the symptoms will go away. Get medical help right away. Call your local emergency services (911 in the [place] Do not drive yourself to the hospital. Influenza Virus Vaccine injection What is this medication? INFLUENZA VIRUS VACCINE (in floo EN zuh VAHY ruhs vak SEEN) helps to reduce the risk of getting influenza also known as the flu. The vaccine only helps protect you against some strains of the flu. This medicine may be used for other purposes; ask your health care provider or pharmacist if you have questions. COMMON BRAND NAME(S): Afluria, Afluria Quadrivalent, Agriflu, Alfuria, FLUAD, FLUAD Quadrivalent, Fluarix, Fluarix Quadrivalent, Flublok, Flublok Quadrivalent, FLUCELVAX, FLUCELVAX Quadrivalent, Flulaval, Flulaval Quadrivalent, Fluvirin, Fluzone, Fluzone High-Dose, Fluzone Intradermal, Fluzone Quadrivalent What should I tell my care team before I take this medication? They need to know if you have any of these conditions: bleeding disorder like hemophilia fever or infection Guillain-Barre syndrome or other neurological problems  immune system problems infection with the human immunodeficiency virus (HIV) or AIDS low blood platelet counts multiple sclerosis an unusual or allergic reaction to influenza virus vaccine,  latex, other medicines, foods, dyes, or preservatives. Different brands of vaccines contain different allergens. Some may contain latex or eggs. Talk to your doctor about your allergies to make sure that you get the right vaccine. pregnant or trying to get pregnant breast-feeding How should I use this medication? This vaccine is for injection into a muscle or under the skin. It is given by a health care professional. What should I watch for while using this medication? Report any side effects that do not go away within 3 days to your doctor or health care professional. Call your health care provider if any unusual symptoms occur within 6 weeks of receiving this vaccine. You may still catch the flu, but the illness is not usually as bad. You cannot get the flu from the vaccine. The vaccine will not protect against colds or other illnesses that may cause fever. The vaccine is needed every year. What side effects may I notice from receiving this medication? Side effects that you should report to your doctor or health care professional as soon as possible: allergic reactions like skin rash, itching or hives, swelling of the face, lips, or tongue Side effects that usually do not require medical attention (report to your doctor or health care professional if they continue or are bothersome): fever headache muscle aches and pains pain, tenderness, redness, or swelling at the injection site tiredness Side effects that you should report to your doctor or health care professional as soon as possible: allergic reactions like skin rash, itching or hives, swelling of the face, lips, or tongue Side effects that usually do not require medical attention (report to your doctor or health care professional if they continue or are bothersome): fever headache muscle aches and pains pain, tenderness, redness, or swelling at the injection site tiredness This list may not describe all possible side effects. Call  your doctor for medical advice about side effects. You may report side effects to FDA at 1-800-FDA-1088. Where should I keep my medication? The vaccine will be given by a health care professional in a clinic, pharmacy, doctor's office, or other health care setting. You will not be given vaccine doses to store at home. NOTE: This sheet is a summary. It may not cover all possible information. If you have questions about this medicine, talk to your doctor, pharmacist, or health care provider.  2022 Elsevier/Gold Standard (2019-12-21 19:49:22)

## 2021-02-22 NOTE — Progress Notes (Signed)
Hematology/Oncology progress note [facility] Telephone:([phone] [phone]   Rebecca Parker Care Team: Center, [facility] as PCP - General (General Practice) Clent Jacks, RN as Oncology Nurse Navigator Earlie Server, MD as Consulting Physician (Oncology) Earlie Server, MD as Consulting Physician (Hematology and Oncology)  REFERRING PROVIDER: Center, [place] VISIT:  Follow up for treatment of cervical cancer  HISTORY OF PRESENTING ILLNESS:   Rebecca Parker is a  70 y.o.  female with PMH listed below was seen in consultation at the request of  Center, [facility]*  for evaluation of cervical cancer   12/01/2020-8/9/022 Rebecca Parker was admitted from the ED for 1 day h/o right lower abdominal / pelvic pain . 12/01/2020 CT abdomen pelvis w contrast showed  thickly septated right ovarian mass measuring 5.1 x 5.1 cm. 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 Recommend initial pelvic ultrasound and likely subsequent MRI to further evaluate, which may be performed on a nonemergent basis. 2. Air within the fundal endometrial cavity. Correlate for recent instrumentation. 3. Pancolonic diverticulosis without evidence of acute diverticulitis.  12/01/2020 Fx D+C  and cervical bx. Uterus sounded to 9 cm.   TVUS POD#1 showed a complex right ovarian cyst with normal doppler flow Pathology- squamous cell CA of the cervix, as well as cancer in endocervix curettage and endometrial curettings   Rebecca Parker was initially treatment for urosepsis, urine culture came back negative Rebecca Parker was treated with IV antibiotics and transitioned to Augmentin and Flagyl.   Diastolic CHF and Diabetes.  UDS + for cocaine.   Rebecca Parker is the oldest of 7 kids (5 girls and 2 boys). Rebecca Parker has 3 children - all SVDs - 2 boys and one girl.  Rebecca Parker was seen by GynOnc Dr.Secord.  Her pelvic examination showed On palpations suspect lateral vaginal disease >50% down  the vault and disease involving the left fornix. Cervix enlarged >4 cm with grossly obvious tumor and hard to palpation. Uterus - may be enlarged with firm mass on the right aspect versus palpation of the right adnexal mass. Positive parametrial involvement on the right and on the left with disease to or almost to the sidewall on the left. Rectovaginal exam was confirmatory.    # 8/31 2022, PET scan showed signs of cervical cancer with diffuse involvement of the uterus.  Cystic and solid right ovarian lesion more likely related to diffuse cervical cancer.  Concomitant synchronous cystic ovarian neoplasm could have appearance but feel less likely given the diffuse nature of disease throughout the uterus and cervix.  Small lymph nodes in the pelvis in the left pelvis suspicious for nodal involvement at the common iliac level.  No solid organ distant metastasis. Right lung renal consult and glossotonsillar sulcus uptake which is asymmetric and with increased fullness.  While this may be physiologic, will suggest direct visualization for further evaluation to exclude neoplasm. Right thyroid uptake with visible nodule.  Recommend ultrasound thyroid and biopsy.  Rebecca Parker has had baseline audiogram done and chemotherapy class. 01/03/2021 cisplatin 20 mg/m2 today.  Dose were reduced to 50% due to her impaired kidney function.  Treatment plan was switched to carboplatin given her decreased kidney function 01/10/2021 carboplatin. Kidney function improved. 01/17/2021 cisplatin.  02/01/2021 cisplatin 02/08/2021 cisplatin Last radiation 02/08/2021.  PD-L1 CPS 20%  INTERVAL HISTORY SHEVY Rebecca Parker is a 70 y.o. female who has above history reviewed by me today presents for follow up visit for treatment of cervix cancer. Problems and  complaints are listed below: Rebecca Parker finished concurrent chemo-RT.   Today Rebecca Parker reports feeling well. No nausea vomiting diarrhea. Rebecca Parker has been evaluated by ENT.  Consult note was  reviewed.  There was no lesion at the site of increased metabolism on PET.   02/20/2021, Rebecca Parker has had an ultrasound thyroid done.  Rebecca Parker was found to have multiple thyroid nodules.  Dominant nodule in the right mid thyroid was 4.5 cm and I recommend tissue sampling.  Solid nodule in the left mid thyroid needs follow-up.  The remaining bilateral nodules appear benign. Rebecca Parker reports that ENT plans to biopsy her thyroid nodule in January 2023.   Review of Systems  Constitutional:  Positive for fatigue. Negative for appetite change, chills and fever.  HENT:   Negative for hearing loss and voice change.   Eyes:  Negative for eye problems.  Respiratory:  Negative for chest tightness and cough.   Cardiovascular:  Negative for chest pain.  Gastrointestinal:  Negative for abdominal distention, abdominal pain and blood in stool.  Endocrine: Negative for hot flashes.  Genitourinary:  Negative for difficulty urinating and frequency.   Musculoskeletal:  Negative for arthralgias.       Chronic intermitted left leg pain  Skin:  Negative for itching and rash.  Neurological:  Positive for numbness. Negative for extremity weakness and light-headedness.  Hematological:  Negative for adenopathy.  Psychiatric/Behavioral:  Negative for confusion.    MEDICAL HISTORY:  Past Medical History:  Diagnosis Date   B12 deficiency anemia 01/03/2021   Chest pain, unspecified    CHF (congestive heart failure) ([place])    Diabetes mellitus without complication (HCC)    Hip pain    Hypertension    Multinodular goiter     SURGICAL HISTORY: Past Surgical History:  Procedure Laterality Date   COLONOSCOPY WITH PROPOFOL N/A 11/24/2015   Procedure: COLONOSCOPY WITH PROPOFOL;  Surgeon: Lollie Sails, MD;  Location: [facility] ENDOSCOPY;  Service: Endoscopy;  Laterality: N/A;   COLONOSCOPY WITH PROPOFOL N/A 03/18/2019   Procedure: COLONOSCOPY WITH PROPOFOL;  Surgeon: Virgel Manifold, MD;  Location: [facility] ENDOSCOPY;   Service: Endoscopy;  Laterality: N/A;   DILATION AND CURETTAGE OF UTERUS N/A 12/01/2020   Procedure: DILATATION AND CURETTAGE with cervical biopsies;  Surgeon: Schermerhorn, Gwen Her, MD;  Location: [facility] ORS;  Service: Gynecology;  Laterality: N/A;   ESOPHAGOGASTRODUODENOSCOPY (EGD) WITH PROPOFOL N/A 03/18/2019   Procedure: ESOPHAGOGASTRODUODENOSCOPY (EGD) WITH PROPOFOL;  Surgeon: Virgel Manifold, MD;  Location: [facility] ENDOSCOPY;  Service: Endoscopy;  Laterality: N/A;   PORTA CATH INSERTION N/A 01/16/2021   Procedure: PORTA CATH INSERTION;  Surgeon: Katha Cabal, MD;  Location: [facility];  Service: Cardiovascular;  Laterality: N/A;   right ankle orif      SOCIAL HISTORY: Social History   Socioeconomic History   Marital status: Legally Separated    Spouse name: Not on file   Number of children: Not on file   Years of education: Not on file   Highest education level: Not on file  Occupational History   Not on file  Tobacco Use   Smoking status: Every Day    Packs/day: 0.10    Types: Cigarettes   Smokeless tobacco: Never  Substance and Sexual Activity   Alcohol use: Not Currently   Drug use: Not on file   Sexual activity: Not Currently  Other Topics Concern   Not on file  Social History Narrative   Not on file   Social Determinants of Health  Financial Resource Strain: Not on file  Food Insecurity: Not on file  Transportation Needs: Not on file  Physical Activity: Not on file  Stress: Not on file  Social Connections: Not on file  Intimate Partner Violence: Not on file    FAMILY HISTORY: Family History  Problem Relation Age of Onset   Breast cancer Maternal Aunt    Breast cancer Paternal Aunt     ALLERGIES:  is allergic to ace inhibitors and cyclobenzaprine.  MEDICATIONS:  Current Outpatient Medications  Medication Sig Dispense Refill   aspirin EC 81 MG tablet Take 81 mg by mouth daily.     Calcium Carbonate-Vitamin D 500-125 MG-UNIT TABS Take  1 tablet by mouth daily.     docusate sodium (COLACE) 100 MG capsule Take 100 mg by mouth daily.     Ferrous Sulfate (IRON) 325 (65 Fe) MG TABS Take 1 tablet by mouth 2 (two) times daily.     furosemide (LASIX) 20 MG tablet Take 20 mg by mouth 2 (two) times daily.     gabapentin (NEURONTIN) 800 MG tablet Take 800 mg by mouth 2 (two) times daily.     hydrOXYzine (ATARAX/VISTARIL) 25 MG tablet Take 25 mg by mouth daily. Take 65m. By mouth every morning     lidocaine-prilocaine (EMLA) cream Apply 1 application topically as needed.     losartan (COZAAR) 50 MG tablet Take 50 mg by mouth 2 (two) times daily.     metFORMIN (GLUCOPHAGE) 500 MG tablet Take 500 mg by mouth 2 (two) times daily with a meal.     mirtazapine (REMERON) 15 MG tablet Take 15 mg by mouth at bedtime.     simvastatin (ZOCOR) 20 MG tablet Take 20 mg by mouth daily.     ondansetron (ZOFRAN) 4 MG tablet Take 1 tablet (4 mg total) by mouth every 6 (six) hours as needed for nausea. (Rebecca Parker not taking: No sig reported) 20 tablet 0   oxyCODONE (OXY IR/ROXICODONE) 5 MG immediate release tablet Take 1 tablet (5 mg total) by mouth every 8 (eight) hours as needed for moderate pain. (Rebecca Parker not taking: No sig reported) 15 tablet 0   No current facility-administered medications for this visit.   Facility-Administered Medications Ordered in Other Visits  Medication Dose Route Frequency Provider Last Rate Last Admin   cyanocobalamin ((VITAMIN B-12)) injection 1,000 mcg  1,000 mcg Intramuscular Weekly YEarlie Server MD   1,000 mcg at 02/22/21 1016     PHYSICAL EXAMINATION: ECOG PERFORMANCE STATUS: 2 - Symptomatic, <50% confined to bed Vitals:   02/22/21 0923  BP: (!) 172/111  Pulse: 91  Resp: 17  Temp: (!) 96 F (35.6 C)  SpO2: 100%   Filed Weights   02/22/21 0923  Weight: 137 lb (62.1 kg)    Physical Exam Constitutional:      General: Rebecca Parker is not in acute distress.    Comments: Rebecca Parker is able to ambulate with a walker  HENT:      Head: Normocephalic and atraumatic.  Eyes:     General: No scleral icterus. Cardiovascular:     Rate and Rhythm: Normal rate and regular rhythm.     Heart sounds: Normal heart sounds.  Pulmonary:     Effort: Pulmonary effort is normal. No respiratory distress.     Breath sounds: No wheezing.  Abdominal:     General: Bowel sounds are normal. There is no distension.     Palpations: Abdomen is soft.  Musculoskeletal:  General: No deformity. Normal range of motion.     Cervical back: Normal range of motion and neck supple.  Skin:    General: Skin is warm and dry.     Findings: No erythema or rash.  Neurological:     Mental Status: Rebecca Parker is alert and oriented to person, place, and time. Mental status is at baseline.     Cranial Nerves: No cranial nerve deficit.     Coordination: Coordination normal.  Psychiatric:        Mood and Affect: Mood normal.    LABORATORY DATA:  I have reviewed the data as listed Lab Results  Component Value Date   WBC 2.8 (L) 02/22/2021   HGB 10.6 (L) 02/22/2021   HCT 30.8 (L) 02/22/2021   MCV 106.9 (H) 02/22/2021   PLT 166 02/22/2021   Recent Labs    02/01/21 0830 02/06/21 1858 02/08/21 0816 02/22/21 0906  NA 138 139 136 138  K 3.8 3.5 3.6 4.1  CL 105 104 102 103  CO2 26 27 25 29   GLUCOSE 99 70 98 92  BUN 12 10 8 12   CREATININE 1.01* 1.12* 0.99 0.95  CALCIUM 8.5* 8.4* 8.6* 8.9  GFRNONAA 59* 52* >60 >60  PROT 6.7  --  6.5 6.6  ALBUMIN 3.1*  --  3.2* 3.3*  AST 17  --  16 17  ALT 10  --  12 15  ALKPHOS 49  --  47 54  BILITOT 0.5  --  <0.1* 0.4    Iron/TIBC/Ferritin/ %Sat    Component Value Date/Time   IRON 85 12/26/2020 0846   IRON 90 02/24/2019 1550   TIBC 204 (L) 12/26/2020 0846   TIBC 292 02/24/2019 1550   FERRITIN 25 12/26/2020 0846   FERRITIN 33 02/24/2019 1550   IRONPCTSAT 42 (H) 12/26/2020 0846   IRONPCTSAT 31 02/24/2019 1550       RADIOGRAPHIC STUDIES: I have personally reviewed the radiological images as listed  and agreed with the findings in the report. US THYROID  Result Date: 02/21/2021 CLINICAL DATA:  Incidental on PET. EXAM: THYROID ULTRASOUND TECHNIQUE: Ultrasound examination of the thyroid gland and adjacent soft tissues was performed. COMPARISON:  PET-CT from 12/27/2020 FINDINGS: Parenchymal Echotexture: Moderately heterogenous Isthmus: 0.2 cm Right lobe: 5.3 x 2.4 x 3.4 cm Left lobe: 3.8 x 1.4 x 1.5 cm _________________________________________________________ Estimated total number of nodules >/= 1 cm: 5 Number of spongiform nodules >/=  2 cm not described below (TR1): 0 Number of mixed cystic and solid nodules >/= 1.5 cm not described below (TR2): 0 _________________________________________________________ Nodule # 1: Location: Right; Mid Maximum size: 4.5 cm; Other 2 dimensions: 0.9 x 3.3 cm Composition: solid/almost completely solid (2) Echogenicity: isoechoic (1) Shape: not taller-than-wide (0) Margins: smooth (0) Echogenic foci: macrocalcifications (1) ACR TI-RADS total points: 4. ACR TI-RADS risk category: TR4 (4-6 points). ACR TI-RADS recommendations: **Given size (>/= 1.5 cm) and appearance, fine needle aspiration of this moderately suspicious nodule should be considered based on TI-RADS criteria. _________________________________________________________ Nodule # 2: Location: Right; Inferior Maximum size: 1.0 cm; Other 2 dimensions: 0.7 x 0.9 cm Composition: solid/almost completely solid (2) Echogenicity: isoechoic (1) Shape: not taller-than-wide (0) Margins: smooth (0) Echogenic foci: none (0) ACR TI-RADS total points: 3. ACR TI-RADS risk category: TR3 (3 points). ACR TI-RADS recommendations: Given size (<1.4 cm) and appearance, this nodule does NOT meet TI-RADS criteria for biopsy or dedicated follow-up. _________________________________________________________ Nodule # 3: Location: Right; Inferior Maximum size: 1.1 cm; Other 2 dimensions: 1.0  x 0.9 cm Composition: solid/almost completely solid (2)  Echogenicity: isoechoic (1) Shape: not taller-than-wide (0) Margins: ill-defined (0) Echogenic foci: none (0) ACR TI-RADS total points: 3. ACR TI-RADS risk category: TR3 (3 points). ACR TI-RADS recommendations: Given size (<1.4 cm) and appearance, this nodule does NOT meet TI-RADS criteria for biopsy or dedicated follow-up. _________________________________________________________ Nodule # 4: Location: Left; Mid Maximum size: 1.3 cm; Other 2 dimensions: 1.0 x 0.7 cm Composition: solid/almost completely solid (2) Echogenicity: isoechoic (1) Shape: not taller-than-wide (0) Margins: ill-defined (0) Echogenic foci: macrocalcifications (1) ACR TI-RADS total points: 4. ACR TI-RADS risk category: TR4 (4-6 points). ACR TI-RADS recommendations: *Given size (>/= 1 - 1.4 cm) and appearance, a follow-up ultrasound in 1 year should be considered based on TI-RADS criteria. _________________________________________________________ Nodule # 5: Location: Left; Mid Maximum size: 0.9 cm; Other 2 dimensions: 0.7 x 0.7 cm Composition: solid/almost completely solid (2) Echogenicity: isoechoic (1) Shape: not taller-than-wide (0) Margins: ill-defined (0) Echogenic foci: macrocalcifications (1) ACR TI-RADS total points: 4. ACR TI-RADS risk category: TR4 (4-6 points). ACR TI-RADS recommendations: Given size (< 1.0 cm) and appearance, this nodule does NOT meet TI-RADS criteria for biopsy or dedicated follow-up. _________________________________________________________ Nodule # 6: Location: Left; Inferior Maximum size: 1.2 cm; Other 2 dimensions: 1.0 x 1.0 cm Composition: solid/almost completely solid (2) Echogenicity: isoechoic (1) Shape: not taller-than-wide (0) Margins: smooth (0) Echogenic foci: none (0) ACR TI-RADS total points: 3. ACR TI-RADS risk category: TR3 (3 points). ACR TI-RADS recommendations: Given size (<1.4 cm) and appearance, this nodule does NOT meet TI-RADS criteria for biopsy or dedicated follow-up.  _________________________________________________________ No cervical lymphadenopathy. IMPRESSION: 1. Dominant nodule in the right mid thyroid (labeled 1, 4.5 cm) which correlates with hypermetabolic thyroid nodule on recent PET-CT meets criteria (TI-RADS category 4) for tissue sampling. Recommend ultrasound-guided fine-needle aspiration. 2. Solid nodule in the left mid thyroid (labeled 4, 1.3 cm) meets criteria (TI-RADS category 4) for 1 year ultrasound follow-up. 3. The remaining bilateral thyroid nodules appear benign and do not warrant additional ultrasound follow-up or tissue sampling. The above is in keeping with the ACR TI-RADS recommendations - J Am Coll Radiol 2017;14:587-595. Ruthann Cancer, MD [facility] Radiology Electronically Signed   By: Ruthann Cancer M.D.   On: 02/21/2021 09:56      ASSESSMENT & PLAN:  1. 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[place])   2. Encounter for antineoplastic chemotherapy   3. Other vitamin B12 deficiency anemia   4. Port-A-Cath in place   5. Thyroid nodule    #Locally advanced cervical cancer.  Stage III Not a surgical candidate. Currently on concurrent chemotherapy and radiation.  S/p concurrent chemotherapy radiation  Clinically Rebecca Parker is doing well. Labs are reviewed and discussed with Rebecca Parker. Repeat PET scan in 3 months, to evaluate treatment response.   Porta Cath in place due to lack of IV access. Continue port flush Q8weeks x 6  # Anemia, stable hemoglobin # Vitamin B12 deficency.  B12 today, continue monthly x 6. Check B12 at next visit.   #Abnormal PET scan with tonsil uptake. No lesion with ENT examination.  # Thyroid nodules, per Rebecca Parker Rebecca Parker plans to have biopsy of nodule by ENT in January 2023, recommend her to follow up with ENT.  Another nodule needs to be monitored in 1 year, I will defer to ENT.   All questions were answered. The Rebecca Parker knows to call the clinic with any problems questions or  concerns.  cc Center, [place]  Return of visit:  3 months, to review PET scan results.   Earlie Server, MD, PhD 02/22/2021

## 2021-02-22 NOTE — Progress Notes (Signed)
Patient here for oncology follow-up appointment, concerns of leg numbness. BP elevated, patient stated she didn't take BP meds this morning.

## 2021-03-05 ENCOUNTER — Ambulatory Visit
Admission: RE | Admit: 2021-03-05 | Discharge: 2021-03-05 | Disposition: A | Discharge status: Home / Self Care | Payer: Medicare Other | Source: Ambulatory Visit | Attending: Otolaryngology | Admitting: Otolaryngology

## 2021-03-05 DIAGNOSIS — E041 Nontoxic single thyroid nodule: Secondary | ICD-10-CM

## 2021-03-05 NOTE — Procedures (Signed)
PROCEDURE SUMMARY:  Successful US guided FNA of right mid thyroid nodule. No immediate complications.  Pt tolerated well.   Specimen was sent for labs.  EBL < 60mL  Rockney Ghee 03/05/2021 1:53 PM

## 2021-03-14 ENCOUNTER — Other Ambulatory Visit: Payer: Self-pay

## 2021-03-14 ENCOUNTER — Inpatient Hospital Stay: Payer: Medicare Other

## 2021-03-14 ENCOUNTER — Inpatient Hospital Stay: Payer: Medicare Other | Attending: Oncology | Admitting: Obstetrics and Gynecology

## 2021-03-14 ENCOUNTER — Ambulatory Visit
Admission: RE | Admit: 2021-03-14 | Discharge: 2021-03-14 | Disposition: A | Discharge status: Home / Self Care | Payer: Medicare Other | Source: Ambulatory Visit | Attending: Radiation Oncology | Admitting: Radiation Oncology

## 2021-03-14 VITALS — BP 139/98 | HR 104 | Temp 97.0°F | Resp 20 | Wt 132.0 lb

## 2021-03-14 VITALS — BP 139/98 | HR 104 | Temp 97.0°F | Wt 132.2 lb

## 2021-03-14 DIAGNOSIS — F17210 Nicotine dependence, cigarettes, uncomplicated: Secondary | ICD-10-CM | POA: Insufficient documentation

## 2021-03-14 DIAGNOSIS — E538 Deficiency of other specified B group vitamins: Secondary | ICD-10-CM | POA: Insufficient documentation

## 2021-03-14 DIAGNOSIS — N838 Other noninflammatory disorders of ovary, fallopian tube and broad ligament: Secondary | ICD-10-CM | POA: Insufficient documentation

## 2021-03-14 DIAGNOSIS — I872 Venous insufficiency (chronic) (peripheral): Secondary | ICD-10-CM | POA: Insufficient documentation

## 2021-03-14 DIAGNOSIS — E119 Type 2 diabetes mellitus without complications: Secondary | ICD-10-CM | POA: Diagnosis not present

## 2021-03-14 DIAGNOSIS — E782 Mixed hyperlipidemia: Secondary | ICD-10-CM | POA: Insufficient documentation

## 2021-03-14 DIAGNOSIS — I509 Heart failure, unspecified: Secondary | ICD-10-CM | POA: Diagnosis not present

## 2021-03-14 DIAGNOSIS — I10 Essential (primary) hypertension: Secondary | ICD-10-CM | POA: Insufficient documentation

## 2021-03-14 DIAGNOSIS — D518 Other vitamin B12 deficiency anemias: Secondary | ICD-10-CM

## 2021-03-14 DIAGNOSIS — D497 Neoplasm of unspecified behavior of endocrine glands and other parts of nervous system: Secondary | ICD-10-CM

## 2021-03-14 DIAGNOSIS — F1410 Cocaine abuse, uncomplicated: Secondary | ICD-10-CM | POA: Insufficient documentation

## 2021-03-14 DIAGNOSIS — Z7984 Long term (current) use of oral hypoglycemic drugs: Secondary | ICD-10-CM | POA: Insufficient documentation

## 2021-03-14 DIAGNOSIS — I5032 Chronic diastolic (congestive) heart failure: Secondary | ICD-10-CM | POA: Diagnosis not present

## 2021-03-14 DIAGNOSIS — Z95828 Presence of other vascular implants and grafts: Secondary | ICD-10-CM

## 2021-03-14 DIAGNOSIS — Z923 Personal history of irradiation: Secondary | ICD-10-CM | POA: Insufficient documentation

## 2021-03-14 DIAGNOSIS — Z79899 Other long term (current) drug therapy: Secondary | ICD-10-CM | POA: Insufficient documentation

## 2021-03-14 DIAGNOSIS — Z9221 Personal history of antineoplastic chemotherapy: Secondary | ICD-10-CM | POA: Insufficient documentation

## 2021-03-14 DIAGNOSIS — C539 Malignant neoplasm of cervix uteri, unspecified: Secondary | ICD-10-CM | POA: Insufficient documentation

## 2021-03-14 MED ORDER — CYANOCOBALAMIN 1000 MCG/ML IJ SOLN
1000.0000 ug | INTRAMUSCULAR | Status: DC
  Administered 2021-03-14: 1000 ug via INTRAMUSCULAR
  Filled 2021-03-14: qty 1

## 2021-03-14 NOTE — Progress Notes (Signed)
Gynecologic Oncology Consult Visit   Referring Provider: Boykin Nearing, MD   Chief Concern: cervical cancer  Subjective:  Rebecca Parker is a 70 y.o. G3P3 female who is seen in consultation from Dr. Ouida Sills for cervical cancer now s/p chemo and radiation completed 02/08/21.   She also had thyroid biopsy on 03/05/21.  DIAGNOSIS:  A. THYROID GLAND, RIGHT MIDDLE LOBE; ULTRASOUND-GUIDED FINE-NEEDLE  ASPIRATION:  - SUSPICIOUS FOR FOLLICULAR NEOPLASM (BETHESDA CATEGORY IV).  Disposition still pending.   She was seen by ENT as well on 02/13/2021 and there was no evidence of tonsillary or ENT cancer on exam. HIV test negative 11/2020.  She presents today for follow up after EBRT. She has no complaints.    Gynecologic Oncology History:  12/01/2020-8/9/022 She was admitted from the ED for 1 day h/o right lower abdominal / pelvic pain . CT scan revealed a right ovarian cyst 5 cm and air in endometrial cavity. Bedside exam revealed a firm cervical rim and purulent d/c. WBC 23K concern for pyometra.    12/01/2020 Fx D+C  and cervical bx. Uterus sounded to 9 cm.  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   Initally she was thought of urosepsis however neg urine culture. In retrospect, urine was probably contaminated with purulent cervical discharge. She was treated with gent and Cleocin switched to oral Flagyl and is currently on Augmentin.   Clinically patient's pain improved with IV/ PO abx . Hospitalist helped managed DM and CHF ( no significant tx)  IV access difficult and PICC line was requested if additional IV needed .   She was also diagnosed with anemia. Hypoalbuminemia, and thrombocytosis. Her tox screen was positive for cocaine she says she only uses occasionally.    She is the oldest of 7 kids (5 girls and 2 boys). She has 3 children - all SVDs - 2 boys  and one girl. She has many medical issues and presents to clinic using a rolling walker.      .   Problem List: Patient Active Problem List   Diagnosis Date Noted   Port-A-Cath in place 01/17/2021   Abnormal positron emission tomography (PET) scan 01/17/2021   Tobacco use 01/03/2021   Encounter for antineoplastic chemotherapy 01/03/2021   Vitamin B12 deficiency 01/03/2021   B12 deficiency anemia 01/03/2021   Normocytic anemia 12/23/2020   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[place]) 12/23/2020   Goals of care, counseling/discussion 12/23/2020   Cocaine abuse ([place]) 12/23/2020   Sepsis ([place]) 12/01/2020   Ovarian mass, right 12/01/2020   Acute lower UTI 12/01/2020   Pelvic pain 12/01/2020   Morbid (severe) obesity due to excess calories ([place]) 11/15/2015   Mixed incontinence 10/24/2015   Venous insufficiency of both lower extremities 11/24/2014   Benign essential hypertension 11/18/2014   Chronic diastolic CHF (congestive heart failure), NYHA class 2 ([place]) 05/17/2014   Mixed hyperlipidemia 05/17/2014   Tobacco dependence 02/10/2014   Thyroid nodule 02/10/2014   Spinal stenosis, lumbar region without neurogenic claudication 03/05/2013   Constipation 01/29/2013   Eosinophil count raised 10/20/2012   Urinary incontinence 10/19/2012   Type 2 diabetes mellitus without complications ([place]) 84/16/6063   Neck pain 06/15/2012   Polyneuropathy 11/15/2011   Palmar fascial fibromatosis 09/13/2011   Edema 05/29/2011   Vitamin D deficiency 07/31/2009   Insomnia 07/27/2009    Past Medical History: Past Medical History:  Diagnosis  Date   B12 deficiency anemia 01/03/2021   Chest pain, unspecified    CHF (congestive heart failure) ([place])    Diabetes mellitus without complication (HCC)    Hip pain    Hypertension    Multinodular goiter     Past Surgical History: Past Surgical History:  Procedure Laterality Date   COLONOSCOPY WITH PROPOFOL N/A 11/24/2015   Procedure: COLONOSCOPY WITH PROPOFOL;   Surgeon: Lollie Sails, MD;  Location: [facility] ENDOSCOPY;  Service: Endoscopy;  Laterality: N/A;   COLONOSCOPY WITH PROPOFOL N/A 03/18/2019   Procedure: COLONOSCOPY WITH PROPOFOL;  Surgeon: Virgel Manifold, MD;  Location: [facility] ENDOSCOPY;  Service: Endoscopy;  Laterality: N/A;   DILATION AND CURETTAGE OF UTERUS N/A 12/01/2020   Procedure: DILATATION AND CURETTAGE with cervical biopsies;  Surgeon: Schermerhorn, Gwen Her, MD;  Location: [facility] ORS;  Service: Gynecology;  Laterality: N/A;   ESOPHAGOGASTRODUODENOSCOPY (EGD) WITH PROPOFOL N/A 03/18/2019   Procedure: ESOPHAGOGASTRODUODENOSCOPY (EGD) WITH PROPOFOL;  Surgeon: Virgel Manifold, MD;  Location: [facility] ENDOSCOPY;  Service: Endoscopy;  Laterality: N/A;   PORTA CATH INSERTION N/A 01/16/2021   Procedure: PORTA CATH INSERTION;  Surgeon: Katha Cabal, MD;  Location: [facility];  Service: Cardiovascular;  Laterality: N/A;   right ankle orif      Past Gynecologic History:  As per HPI. She does not recall dates of menarche, LMP. She does have a h/o abnormal Pap, but last Pap not listed.   OB History:  OB History  Gravida Para Term Preterm AB Living  3 3          SAB IAB Ectopic Multiple Live Births               # Outcome Date GA Lbr Len/2nd Weight Sex Delivery Anes PTL Lv  3 Para           2 Para           1 Para             Family History: Family History  Problem Relation Age of Onset   Breast cancer Maternal Aunt    Breast cancer Paternal Aunt     Social History: Social History   Socioeconomic History   Marital status: Legally Separated    Spouse name: Not on file   Number of children: Not on file   Years of education: Not on file   Highest education level: Not on file  Occupational History   Not on file  Tobacco Use   Smoking status: Every Day    Packs/day: 0.10    Types: Cigarettes   Smokeless tobacco: Never  Substance and Sexual Activity   Alcohol use: Not Currently   Drug use: Yes    Types:  Cocaine   Sexual activity: Not Currently  Other Topics Concern   Not on file  Social History Narrative   Not on file   Social Determinants of Health   Financial Resource Strain: Not on file  Food Insecurity: Not on file  Transportation Needs: Not on file  Physical Activity: Not on file  Stress: Not on file  Social Connections: Not on file  Intimate Partner Violence: Not on file    Allergies: Allergies  Allergen Reactions   Ace Inhibitors Itching    Rash   Cyclobenzaprine Itching    Rash and 'made me nervous'    Current Medications: Current Outpatient Medications  Medication Sig Dispense Refill   aspirin EC 81 MG tablet Take 81 mg by mouth  daily.     Calcium Carbonate-Vitamin D 500-125 MG-UNIT TABS Take 1 tablet by mouth daily.     docusate sodium (COLACE) 100 MG capsule Take 100 mg by mouth daily.     Ferrous Sulfate (IRON) 325 (65 Fe) MG TABS Take 1 tablet by mouth 2 (two) times daily.     furosemide (LASIX) 20 MG tablet Take 20 mg by mouth 2 (two) times daily.     gabapentin (NEURONTIN) 800 MG tablet Take 800 mg by mouth 2 (two) times daily.     hydrOXYzine (ATARAX/VISTARIL) 25 MG tablet Take 25 mg by mouth daily. Take 25mg . By mouth every morning     lidocaine-prilocaine (EMLA) cream Apply 1 application topically as needed.     losartan (COZAAR) 50 MG tablet Take 50 mg by mouth 2 (two) times daily.     metFORMIN (GLUCOPHAGE) 500 MG tablet Take 500 mg by mouth 2 (two) times daily with a meal.     mirtazapine (REMERON) 15 MG tablet Take 15 mg by mouth at bedtime.     ondansetron (ZOFRAN) 4 MG tablet Take 1 tablet (4 mg total) by mouth every 6 (six) hours as needed for nausea. (Patient not taking: No sig reported) 20 tablet 0   oxyCODONE (OXY IR/ROXICODONE) 5 MG immediate release tablet Take 1 tablet (5 mg total) by mouth every 8 (eight) hours as needed for moderate pain. (Patient not taking: No sig reported) 15 tablet 0   simvastatin (ZOCOR) 20 MG tablet Take 20 mg by  mouth daily.     No current facility-administered medications for this visit.   Facility-Administered Medications Ordered in Other Visits  Medication Dose Route Frequency Provider Last Rate Last Admin   cyanocobalamin ((VITAMIN B-12)) injection 1,000 mcg  1,000 mcg Intramuscular Weekly Earlie Server, MD   1,000 mcg at 03/14/21 1527    Review of Systems General:  no complaints Skin: no complaints Eyes: no complaints HEENT: no complaints Breasts: no complaints Pulmonary: no complaints Cardiac: no complaints Gastrointestinal: no complaints Genitourinary/Sexual: no complaints Ob/Gyn: no complaints Musculoskeletal: no complaints Hematology: no complaints Neurologic/Psych: no complaints  Objective:  Physical Examination:  BP (!) 139/98   Pulse (!) 104   Temp (!) 97 F (36.1 C)   Resp 20   Wt 132 lb (59.9 kg)   SpO2 100%   BMI 24.54 kg/m     ECOG Performance Status: 1 - Symptomatic but completely ambulatory   GENERAL: Patient is a frail appearing female in no acute distress HEENT:  PERRL, neck supple with midline trachea. Thyroid without masses.  NODES:  No cervical, supraclavicular, axillary, or inguinal lymphadenopathy palpated.  LUNGS:  Clear to auscultation bilaterally. But decreased breath sounds CHEST: port intact without erythema HEART:  Regular rate and rhythm.  BACK: No CVAT  ABDOMEN:  Soft, nontender, nondistended. No masses/hernias/ascites.   EXTREMITIES:  No peripheral edema.   NEURO:  Nonfocal. Well oriented.  Appropriate affect.  Pelvic chaperoned by CMA;  Vulva: normal appearing vulva with no masses, tenderness or lesions; Vagina: no gross lesions on exam. Impressive response based on vaginal and cervical exam. No palpable evidence of disease. Cervix no grossly obvious tumor. Uterus - may be enlarged with firm mass on the right aspect versus palpation of the right adnexal mass. Parametria smooth bilaterally.  Rectovaginal exam deferred  Lab Review Labs on site  today: Lab Results  Component Value Date   WBC 2.8 (L) 02/22/2021   HGB 10.6 (L) 02/22/2021   HCT 30.8 (L) 02/22/2021  MCV 106.9 (H) 02/22/2021   PLT 166 02/22/2021     Chemistry      Component Value Date/Time   NA 138 02/22/2021 0906   NA 141 02/24/2019 1550   NA 141 11/01/2013 1021   K 4.1 02/22/2021 0906   K 4.0 11/01/2013 1021   CL 103 02/22/2021 0906   CL 109 (H) 11/01/2013 1021   CO2 29 02/22/2021 0906   CO2 27 11/01/2013 1021   BUN 12 02/22/2021 0906   BUN 10 02/24/2019 1550   BUN 8 11/01/2013 1021   CREATININE 0.95 02/22/2021 0906   CREATININE 1.13 11/01/2013 1021      Component Value Date/Time   CALCIUM 8.9 02/22/2021 0906   CALCIUM 8.4 (L) 11/01/2013 1021   ALKPHOS 54 02/22/2021 0906   ALKPHOS 70 11/01/2013 1021   AST 17 02/22/2021 0906   AST 19 11/01/2013 1021   ALT 15 02/22/2021 0906   ALT 22 11/01/2013 1021   BILITOT 0.4 02/22/2021 0906   BILITOT 0.2 02/24/2019 1550   BILITOT 0.2 11/01/2013 1021     Radiologic Imaging: EXAM: 12/01/2020 CT ABDOMEN AND PELVIS WITH CONTRAST   TECHNIQUE: Multidetector CT imaging of the abdomen and pelvis was performed using the standard protocol following bolus administration of intravenous contrast.   CONTRAST:  56mL OMNIPAQUE IOHEXOL 350 MG/ML SOLN   COMPARISON:  11/04/2018   FINDINGS: Lower chest: No acute abnormality.   Hepatobiliary: No solid liver abnormality is seen. No gallstones, gallbladder wall thickening, or biliary dilatation.   Pancreas: Unremarkable. No pancreatic ductal dilatation or surrounding inflammatory changes.   Spleen: Normal in size without significant abnormality.   Adrenals/Urinary Tract: Adrenal glands are unremarkable. Kidneys are normal, without renal calculi, solid lesion, or hydronephrosis. Bladder is unremarkable.   Stomach/Bowel: Stomach is within normal limits. Appendix appears normal. No evidence of bowel wall thickening, distention, or inflammatory changes. Pancolonic  diverticulosis.   Vascular/Lymphatic: Aortic atherosclerosis. No enlarged abdominal or pelvic lymph nodes.   Reproductive: Air within the fundal endometrial cavity (series 6, image 86). There is a new, thickly septated right ovarian mass measuring 5.1 x 5.1 cm (series 2, image 64).   Other: No abdominal wall hernia or abnormality. No abdominopelvic ascites.   Musculoskeletal: No acute or significant osseous findings.   IMPRESSION: 1. There is a new, thickly septated right ovarian mass measuring 5.1 x 5.1 cm. 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 Recommend initial pelvic ultrasound and likely subsequent MRI to further evaluate, which may be performed on a nonemergent basis. 2. Air within the fundal endometrial cavity. Correlate for recent instrumentation. 3. Pancolonic diverticulosis without evidence of acute diverticulitis.    CXR 12/01/2020 IMPRESSION: Cardiac enlargement without acute cardiopulmonary abnormality.    Assessment:  YAMELI DELAMATER is a 70 y.o. female diagnosed with locally advanced cervical cancer with right ovarian mass which may represent metastatic disease versus benign ovarian cyst/TOA/separate primary (the latter is unlikely) s/p external beam radiotherapy with excellent response based on exam.   Performance status improved.   Medical co-morbidities complicating care: Body mass index is 24.54 kg/m. Positive cocaine use. Type 2 diabetes mellitus without complication; h/o sepsis; HTN; Chronic diastolic CHF (congestive heart failure), NYHA class 2 (HCC)  Plan:   Problem List Items Addressed This Visit       Genitourinary   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[place]) - Primary     Other   Ovarian mass, right   Port-A-Cath in place   Other Visit Diagnoses     Follicular  neoplasm of thyroid           We discussed options for management and I recommended [facility] referral for consideration of intracavitary radiation. Dr. Olena Leatherwood team will set up this  appointment. I spoke with Ms Osgood for at least 15 minutes regarding how important it is to keep her appt with Dr. Christel Mormon for intracavitary radiation. Repeat PET scan 3 months after completion of all radiation therapy.   Thyroid findings - SUSPICIOUS FOR FOLLICULAR NEOPLASM (BETHESDA CATEGORY IV). We will reach out to the ENT team regarding disposition.   Maintain IV port - defer to Dr. Tasia Catchings.   Continue close follow up.    The patient's diagnosis, an outline of the further diagnostic and laboratory studies which will be required, the recommendation, and alternatives were discussed.  All questions were answered to the patient's satisfaction.  [name], NP  I personally had a face to face interaction and evaluated the patient jointly with the NP, Ms. [name].  I have reviewed her history and available records and have performed the key portions of the physical exam including lymph node survey, abdominal exam, pelvic exam with my findings confirming those documented above by the APP.  I have discussed the case with the APP and the patient.  I agree with the above documentation, assessment and plan which was fully formulated by me.  Counseling was completed by me.   I personally saw the patient and performed a substantive portion of this encounter in conjunction with the listed APP as documented above.  Josedaniel Haye Gaetana Michaelis, MD

## 2021-03-15 ENCOUNTER — Inpatient Hospital Stay: Payer: Medicare Other

## 2021-03-15 ENCOUNTER — Telehealth: Payer: Self-pay

## 2021-03-15 NOTE — Telephone Encounter (Signed)
Called and spoke with daughter, Baker Janus. Updated on need for brachytherapy at [facility]. Referral has been sent and [facility] will call her for the appointment.

## 2021-03-15 NOTE — Progress Notes (Signed)
Radiation Oncology Follow up Note  Name: Rebecca Parker   Date:   03/14/2021 MRN:  [id number] DOB: [birth year]    This 70 y.o. female presents to the clinic today for 1 month follow-up status post concurrent chemoradiation therapy for locally advanced squamous cell carcinoma the cervix.  REFERRING PROVIDER: [facility]  HPI:  .  Patient is a 70 year old female 1 month out from concurrent chemoradiation therapy for locally advanced squamous cell carcinoma of the cervix.  She has a right ovarian mass which may represent metastatic disease versus benign ovarian cyst.  She was examined today by Dr. Theora Gianotti showing excellent clinical response.  She specifically denies significant lower urinary tract symptoms diarrhea.  Patient also have a thyroid biopsy suspicious for follicular neoplasm and ENT is following that.  COMPLICATIONS OF TREATMENT: none  FOLLOW UP COMPLIANCE: keeps appointments   PHYSICAL EXAM:  BP (!) 139/98   Pulse (!) 104   Temp (!) 97 F (36.1 C) (Tympanic)   Wt 132 lb 3.2 oz (60 kg)   BMI 24.57 kg/m  Well-developed well-nourished patient in NAD. HEENT reveals PERLA, EOMI, discs not visualized.  Oral cavity is clear. No oral mucosal lesions are identified. Neck is clear without evidence of cervical or supraclavicular adenopathy. Lungs are clear to A&P. Cardiac examination is essentially unremarkable with regular rate and rhythm without murmur rub or thrill. Abdomen is benign with no organomegaly or masses noted. Motor sensory and DTR levels are equal and symmetric in the upper and lower extremities. Cranial nerves II through XII are grossly intact. Proprioception is intact. No peripheral adenopathy or edema is identified. No motor or sensory levels are noted. Crude visual fields are within normal range.  RADIOLOGY RESULTS: No current films for review  PLAN: This time patient is scheduled for follow-up PET CT scan in 3 months.  We are referring the patient to Dr. Christel Mormon  at [facility] for possible intracavitary radiation.  Case was discussed with Dr. Theora Gianotti.  Appointments with chain will be made through our department.  We will see her back in 3 to 4 months for follow-up after her PET CT scan and possible intracavitary radiation.  I would like to take this opportunity to thank you for allowing me to participate in the care of your patient.Noreene Filbert, MD

## 2021-03-16 ENCOUNTER — Telehealth: Payer: Self-pay

## 2021-03-16 NOTE — Telephone Encounter (Signed)
Communicated with Dr. Pryor Ochoa regarding results of thyroid biopsy. Rebecca Parker is still pending.

## 2021-03-27 ENCOUNTER — Encounter: Payer: Self-pay | Admitting: Otolaryngology

## 2021-03-27 LAB — CYTOLOGY - NON PAP

## 2021-04-12 ENCOUNTER — Inpatient Hospital Stay: Payer: Medicare Other | Attending: Oncology

## 2021-04-12 ENCOUNTER — Inpatient Hospital Stay: Payer: Medicare Other

## 2021-05-02 ENCOUNTER — Encounter: Payer: Self-pay | Admitting: Oncology

## 2021-05-09 ENCOUNTER — Encounter
Admission: RE | Admit: 2021-05-09 | Discharge: 2021-05-09 | Disposition: A | Discharge status: Home / Self Care | Payer: Medicare Other | Source: Ambulatory Visit | Attending: Oncology | Admitting: Oncology

## 2021-05-09 ENCOUNTER — Other Ambulatory Visit: Payer: Self-pay

## 2021-05-09 DIAGNOSIS — C539 Malignant neoplasm of cervix uteri, unspecified: Secondary | ICD-10-CM | POA: Diagnosis present

## 2021-05-09 LAB — GLUCOSE, CAPILLARY: Glucose-Capillary: 92 mg/dL (ref 70–99)

## 2021-05-09 MED ORDER — FLUDEOXYGLUCOSE F - 18 (FDG) INJECTION
7.1300 | Freq: Once | INTRAVENOUS | Status: AC | PRN
  Administered 2021-05-09: 7.13 via INTRAVENOUS

## 2021-05-10 ENCOUNTER — Encounter: Payer: Self-pay | Admitting: Oncology

## 2021-05-11 ENCOUNTER — Encounter: Payer: Self-pay | Admitting: Oncology

## 2021-05-17 ENCOUNTER — Inpatient Hospital Stay: Payer: Medicare Other

## 2021-05-17 ENCOUNTER — Encounter: Payer: Self-pay | Admitting: Oncology

## 2021-05-17 ENCOUNTER — Other Ambulatory Visit: Payer: Self-pay

## 2021-05-17 ENCOUNTER — Inpatient Hospital Stay: Payer: Medicare Other | Attending: Oncology | Admitting: Oncology

## 2021-05-17 ENCOUNTER — Inpatient Hospital Stay: Payer: Medicare Other | Attending: Oncology

## 2021-05-17 VITALS — BP 143/99 | HR 98 | Temp 97.5°F | Resp 18 | Wt 129.0 lb

## 2021-05-17 DIAGNOSIS — C539 Malignant neoplasm of cervix uteri, unspecified: Secondary | ICD-10-CM

## 2021-05-17 DIAGNOSIS — C73 Malignant neoplasm of thyroid gland: Secondary | ICD-10-CM | POA: Diagnosis not present

## 2021-05-17 DIAGNOSIS — D519 Vitamin B12 deficiency anemia, unspecified: Secondary | ICD-10-CM | POA: Insufficient documentation

## 2021-05-17 DIAGNOSIS — D649 Anemia, unspecified: Secondary | ICD-10-CM

## 2021-05-17 DIAGNOSIS — D497 Neoplasm of unspecified behavior of endocrine glands and other parts of nervous system: Secondary | ICD-10-CM | POA: Diagnosis not present

## 2021-05-17 DIAGNOSIS — D518 Other vitamin B12 deficiency anemias: Secondary | ICD-10-CM | POA: Diagnosis not present

## 2021-05-17 LAB — CBC WITH DIFFERENTIAL/PLATELET
Abs Immature Granulocytes: 0.04 10*3/uL (ref 0.00–0.07)
Basophils Absolute: 0 10*3/uL (ref 0.0–0.1)
Basophils Relative: 0 %
Eosinophils Absolute: 0.9 10*3/uL — ABNORMAL HIGH (ref 0.0–0.5)
Eosinophils Relative: 12 %
HCT: 31.9 % — ABNORMAL LOW (ref 36.0–46.0)
Hemoglobin: 10.6 g/dL — ABNORMAL LOW (ref 12.0–15.0)
Immature Granulocytes: 1 %
Lymphocytes Relative: 18 %
Lymphs Abs: 1.3 10*3/uL (ref 0.7–4.0)
MCH: 35.2 pg — ABNORMAL HIGH (ref 26.0–34.0)
MCHC: 33.2 g/dL (ref 30.0–36.0)
MCV: 106 fL — ABNORMAL HIGH (ref 80.0–100.0)
Monocytes Absolute: 0.6 10*3/uL (ref 0.1–1.0)
Monocytes Relative: 8 %
Neutro Abs: 4.6 10*3/uL (ref 1.7–7.7)
Neutrophils Relative %: 61 %
Platelets: 314 10*3/uL (ref 150–400)
RBC: 3.01 MIL/uL — ABNORMAL LOW (ref 3.87–5.11)
RDW: 13.7 % (ref 11.5–15.5)
WBC: 7.5 10*3/uL (ref 4.0–10.5)
nRBC: 0 % (ref 0.0–0.2)

## 2021-05-17 LAB — COMPREHENSIVE METABOLIC PANEL
ALT: 10 U/L (ref 0–44)
AST: 19 U/L (ref 15–41)
Albumin: 3.3 g/dL — ABNORMAL LOW (ref 3.5–5.0)
Alkaline Phosphatase: 59 U/L (ref 38–126)
Anion gap: 7 (ref 5–15)
BUN: 13 mg/dL (ref 8–23)
CO2: 25 mmol/L (ref 22–32)
Calcium: 8.5 mg/dL — ABNORMAL LOW (ref 8.9–10.3)
Chloride: 106 mmol/L (ref 98–111)
Creatinine, Ser: 1.12 mg/dL — ABNORMAL HIGH (ref 0.44–1.00)
GFR, Estimated: 52 mL/min — ABNORMAL LOW (ref >60)
Glucose, Bld: 93 mg/dL (ref 70–99)
Potassium: 3.7 mmol/L (ref 3.5–5.1)
Sodium: 138 mmol/L (ref 135–145)
Total Bilirubin: 0.2 mg/dL — ABNORMAL LOW (ref 0.3–1.2)
Total Protein: 6.9 g/dL (ref 6.5–8.1)

## 2021-05-17 LAB — VITAMIN B12: Vitamin B-12: 272 pg/mL (ref 180–914)

## 2021-05-17 MED ORDER — CYANOCOBALAMIN 1000 MCG/ML IJ SOLN
1000.0000 ug | INTRAMUSCULAR | Status: DC
  Administered 2021-05-17: 1000 ug via INTRAMUSCULAR
  Filled 2021-05-17: qty 1

## 2021-05-17 NOTE — Progress Notes (Signed)
Hematology/Oncology progress note [facility] [phone] [phone]   Patient Care Team: Center, [facility] as PCP - General (General Practice) Clent Jacks, RN as Oncology Nurse Navigator Earlie Server, MD as Consulting Physician (Oncology) Earlie Server, MD as Consulting Physician (Hematology and Oncology)  REFERRING PROVIDER: Center, [place] VISIT:  Follow up for treatment of cervical cancer  HISTORY OF PRESENTING ILLNESS:   Rebecca Parker is a  71 y.o.  female with PMH listed below was seen in consultation at the request of  Center, Jenny Reichmann*  for evaluation of cervical cancer   12/01/2020-8/9/022 She was admitted from the ED for 1 day h/o right lower abdominal / pelvic pain . 12/01/2020 CT abdomen pelvis w contrast showed  thickly septated right ovarian mass measuring 5.1 x 5.1 cm. 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 Recommend initial pelvic ultrasound and likely subsequent MRI to further evaluate, which may be performed on a nonemergent basis. 2. Air within the fundal endometrial cavity. Correlate for recent instrumentation. 3. Pancolonic diverticulosis without evidence of acute diverticulitis.  12/01/2020 Fx D+C  and cervical bx. Uterus sounded to 9 cm.   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   Diastolic CHF and Diabetes.  UDS + for cocaine.   She is the oldest of 7 kids (5 girls and 2 boys). She has 3 children - all SVDs - 2 boys and one girl.  She was seen by GynOnc Dr.Secord.  Her pelvic examination showed On palpations suspect lateral vaginal disease >50% down  the vault and disease involving the left fornix. Cervix enlarged >4 cm with grossly obvious tumor and hard to palpation. Uterus - may be enlarged with firm mass on the right aspect versus palpation of the right adnexal mass. Positive parametrial involvement on the right and on the left with disease to or almost to the sidewall on the left. Rectovaginal exam was confirmatory.    # 8/31 2022, PET scan showed signs of cervical cancer with diffuse involvement of the uterus.  Cystic and solid right ovarian lesion more likely related to diffuse cervical cancer.  Concomitant synchronous cystic ovarian neoplasm could have appearance but feel less likely given the diffuse nature of disease throughout the uterus and cervix.  Small lymph nodes in the pelvis in the left pelvis suspicious for nodal involvement at the common iliac level.  No solid organ distant metastasis. Right lung renal consult and glossotonsillar sulcus uptake which is asymmetric and with increased fullness.  While this may be physiologic, will suggest direct visualization for further evaluation to exclude neoplasm. Right thyroid uptake with visible nodule.  Recommend ultrasound thyroid and biopsy.  Patient has had baseline audiogram done and chemotherapy class. 01/03/2021 cisplatin 20 mg/m2 today.  Dose were reduced to 50% due to her impaired kidney function.  Treatment plan was switched to carboplatin given her decreased kidney function 01/10/2021 carboplatin. Kidney function improved. 01/17/2021 cisplatin.  02/01/2021 cisplatin 02/08/2021 cisplatin Last radiation 02/08/2021.  PD-L1 CPS 20%  01/17/2021 - 02/08/2021, concurrent chemoradiation to cervical cancer.  Patient has been evaluated by ENT.  Consult note was reviewed.  There was no lesion at the site of increased  metabolism on PET.   02/20/2021, patient has had an ultrasound thyroid done.  Patient was found to have multiple thyroid nodules.  Dominant nodule in the right mid thyroid was  4.5 cm and I recommend tissue sampling.  Solid nodule in the left mid thyroid needs follow-up.  The remaining bilateral nodules appear benign.  03/05/2021, thyroid right mid lobe FNA showed suspicious for follicular neoplasm (Bethesda category IV) ThyroSeq positive, high probability of cancer, >95%  INTERVAL HISTORY Rebecca Parker is a 71 y.o. female who has above history reviewed by me today presents for follow up visit for treatment of cervix cancer. Patient is status post concurrent chemo and radiation treatments here at [facility] as well as intracavitary radiation at [facility] with Dr. Christel Mormon. Patient's not aware about her thyroid nodule resection plan.  Review of Systems  Constitutional:  Positive for fatigue. Negative for appetite change, chills and fever.  HENT:   Negative for hearing loss and voice change.   Eyes:  Negative for eye problems.  Respiratory:  Negative for chest tightness and cough.   Cardiovascular:  Negative for chest pain.  Gastrointestinal:  Negative for abdominal distention, abdominal pain and blood in stool.  Endocrine: Negative for hot flashes.  Genitourinary:  Negative for difficulty urinating and frequency.   Musculoskeletal:  Negative for arthralgias.       Chronic intermitted left leg pain  Skin:  Negative for itching and rash.  Neurological:  Positive for numbness. Negative for extremity weakness and light-headedness.  Hematological:  Negative for adenopathy.  Psychiatric/Behavioral:  Negative for confusion.    MEDICAL HISTORY:  Past Medical History:  Diagnosis Date   B12 deficiency anemia 01/03/2021   Chest pain, unspecified    CHF (congestive heart failure) ([place])    Diabetes mellitus without complication (HCC)    Hip pain    Hypertension    Multinodular goiter     SURGICAL HISTORY: Past Surgical History:  Procedure Laterality Date   COLONOSCOPY WITH PROPOFOL N/A 11/24/2015   Procedure: COLONOSCOPY WITH PROPOFOL;  Surgeon: Lollie Sails, MD;  Location:  [facility] ENDOSCOPY;  Service: Endoscopy;  Laterality: N/A;   COLONOSCOPY WITH PROPOFOL N/A 03/18/2019   Procedure: COLONOSCOPY WITH PROPOFOL;  Surgeon: Virgel Manifold, MD;  Location: [facility] ENDOSCOPY;  Service: Endoscopy;  Laterality: N/A;   DILATION AND CURETTAGE OF UTERUS N/A 12/01/2020   Procedure: DILATATION AND CURETTAGE with cervical biopsies;  Surgeon: Schermerhorn, Gwen Her, MD;  Location: [facility] ORS;  Service: Gynecology;  Laterality: N/A;   ESOPHAGOGASTRODUODENOSCOPY (EGD) WITH PROPOFOL N/A 03/18/2019   Procedure: ESOPHAGOGASTRODUODENOSCOPY (EGD) WITH PROPOFOL;  Surgeon: Virgel Manifold, MD;  Location: [facility] ENDOSCOPY;  Service: Endoscopy;  Laterality: N/A;   PORTA CATH INSERTION N/A 01/16/2021   Procedure: PORTA CATH INSERTION;  Surgeon: Katha Cabal, MD;  Location: [place] CV LAB;  Service: Cardiovascular;  Laterality: N/A;   right ankle orif      SOCIAL HISTORY: Social History   Socioeconomic History   Marital status: Legally Separated    Spouse name: Not on file   Number of children: Not on file   Years of education: Not on file   Highest education level: Not on file  Occupational History   Not on file  Tobacco Use   Smoking status: Every Day    Packs/day: 0.10    Types: Cigarettes   Smokeless tobacco: Never  Substance and Sexual Activity   Alcohol use: Not Currently   Drug use: Yes    Types: Cocaine  Sexual activity: Not Currently  Other Topics Concern   Not on file  Social History Narrative   Not on file   Social Determinants of Health   Financial Resource Strain: Not on file  Food Insecurity: Not on file  Transportation Needs: Not on file  Physical Activity: Not on file  Stress: Not on file  Social Connections: Not on file  Intimate Partner Violence: Not on file    FAMILY HISTORY: Family History  Problem Relation Age of Onset   Breast cancer Maternal Aunt    Breast cancer Paternal Aunt     ALLERGIES:  is allergic to ace inhibitors  and cyclobenzaprine.  MEDICATIONS:  Current Outpatient Medications  Medication Sig Dispense Refill   aspirin EC 81 MG tablet Take 81 mg by mouth daily.     Calcium Carbonate-Vitamin D 500-125 MG-UNIT TABS Take 1 tablet by mouth daily.     docusate sodium (COLACE) 100 MG capsule Take 100 mg by mouth daily.     Ferrous Sulfate (IRON) 325 (65 Fe) MG TABS Take 1 tablet by mouth 2 (two) times daily.     furosemide (LASIX) 20 MG tablet Take 20 mg by mouth 2 (two) times daily.     gabapentin (NEURONTIN) 800 MG tablet Take 800 mg by mouth 2 (two) times daily.     hydrOXYzine (ATARAX/VISTARIL) 25 MG tablet Take 25 mg by mouth daily. Take 39m. By mouth every morning     lidocaine-prilocaine (EMLA) cream Apply 1 application topically as needed.     losartan (COZAAR) 50 MG tablet Take 50 mg by mouth 2 (two) times daily.     metFORMIN (GLUCOPHAGE) 500 MG tablet Take 500 mg by mouth 2 (two) times daily with a meal.     mirtazapine (REMERON) 15 MG tablet Take 15 mg by mouth at bedtime.     simvastatin (ZOCOR) 20 MG tablet Take 20 mg by mouth daily.     ondansetron (ZOFRAN) 4 MG tablet Take 1 tablet (4 mg total) by mouth every 6 (six) hours as needed for nausea. (Patient not taking: Reported on 02/01/2021) 20 tablet 0   oxyCODONE (OXY IR/ROXICODONE) 5 MG immediate release tablet Take 1 tablet (5 mg total) by mouth every 8 (eight) hours as needed for moderate pain. (Patient not taking: Reported on 05/17/2021) 15 tablet 0   No current facility-administered medications for this visit.     PHYSICAL EXAMINATION: ECOG PERFORMANCE STATUS: 1 - Symptomatic but completely ambulatory Vitals:   05/17/21 1328  BP: (!) 143/99  Pulse: 98  Resp: 18  Temp: (!) 97.5 F (36.4 C)   Filed Weights   05/17/21 1328  Weight: 129 lb (58.5 kg)    Physical Exam Constitutional:      General: She is not in acute distress.    Comments: She is able to ambulate with a walker  HENT:     Head: Normocephalic and atraumatic.   Eyes:     General: No scleral icterus. Cardiovascular:     Rate and Rhythm: Normal rate and regular rhythm.     Heart sounds: Normal heart sounds.  Pulmonary:     Effort: Pulmonary effort is normal. No respiratory distress.     Breath sounds: No wheezing.  Abdominal:     General: Bowel sounds are normal. There is no distension.     Palpations: Abdomen is soft.  Musculoskeletal:        General: No deformity. Normal range of motion.     Cervical back: Normal  range of motion and neck supple.  Skin:    General: Skin is warm and dry.     Findings: No erythema or rash.  Neurological:     Mental Status: She is alert and oriented to person, place, and time. Mental status is at baseline.     Cranial Nerves: No cranial nerve deficit.     Coordination: Coordination normal.  Psychiatric:        Mood and Affect: Mood normal.    LABORATORY DATA:  I have reviewed the data as listed Lab Results  Component Value Date   WBC 7.5 05/17/2021   HGB 10.6 (L) 05/17/2021   HCT 31.9 (L) 05/17/2021   MCV 106.0 (H) 05/17/2021   PLT 314 05/17/2021   Recent Labs    02/08/21 0816 02/22/21 0906 05/17/21 1242  NA 136 138 138  K 3.6 4.1 3.7  CL 102 103 106  CO2 25 29 25   GLUCOSE 98 92 93  BUN 8 12 13   CREATININE 0.99 0.95 1.12*  CALCIUM 8.6* 8.9 8.5*  GFRNONAA >60 >60 52*  PROT 6.5 6.6 6.9  ALBUMIN 3.2* 3.3* 3.3*  AST 16 17 19   ALT 12 15 10   ALKPHOS 47 54 59  BILITOT <0.1* 0.4 0.2*    Iron/TIBC/Ferritin/ %Sat    Component Value Date/Time   IRON 85 12/26/2020 0846   IRON 90 02/24/2019 1550   TIBC 204 (L) 12/26/2020 0846   TIBC 292 02/24/2019 1550   FERRITIN 25 12/26/2020 0846   FERRITIN 33 02/24/2019 1550   IRONPCTSAT 42 (H) 12/26/2020 0846   IRONPCTSAT 31 02/24/2019 1550       RADIOGRAPHIC STUDIES: I have personally reviewed the radiological images as listed and agreed with the findings in the report. NM PET Image Restag (PS) Skull Base To Thigh  Result Date:  05/09/2021 CLINICAL DATA:  Subsequent treatment strategy for cervical cancer. EXAM: NUCLEAR MEDICINE PET SKULL BASE TO THIGH TECHNIQUE: 7.13 mCi F-18 FDG was injected intravenously. Full-ring PET imaging was performed from the skull base to thigh after the radiotracer. CT data was obtained and used for attenuation correction and anatomic localization. Fasting blood glucose: 92 mg/dl COMPARISON:  PET-CT 12/27/2020 FINDINGS: Mediastinal blood pool activity: SUV max 1.58 Liver activity: SUV max NA NECK: No neck mass or adenopathy. There is a persistent hypermetabolic partially calcified right thyroid mass measuring a maximum 3 cm. The SUV max is 4.97 and was previously 5.89. This was recently biopsied on 03/05/2021 and was apparently thyroid cancer. Incidental CT findings: Stable calcifications involving the left thyroid lobe. CHEST: No hypermetabolic mediastinal or hilar nodes. No suspicious pulmonary nodules on the CT scan. Incidental CT findings: Stable vascular calcifications. ABDOMEN/PELVIS: No findings for abdominal metastatic disease. No hepatic or adrenal gland lesions. No upper abdominal hypermetabolic lymphadenopathy the. Significant interval improved appearance of the pelvis. The uterus and cervix demonstrated marked hypermetabolism on the prior study with SUV max of 16. I do not see any residual hypermetabolism involving the uterine body or cervix. Partly cystic right-sided adnexal mass persists measuring approximately 4.7 x 3.0 cm. It previously measured 6.2 x 4.5 cm. There is some mild residual hypermetabolism in the more solid area with SUV max of 4.62. This was previously 6.73. No residual hypermetabolic pelvic lymph nodes. Incidental CT findings: Stable vascular calcifications but no aneurysm. Stable diffuse colonic diverticulosis. SKELETON: No findings suspicious for osseous metastatic disease. Incidental CT findings: none IMPRESSION: 1. Findings suggest an excellent response to treatment involving  the uterus and cervix.  No residual hypermetabolic tumor is identified. 2. Persistent partially cystic right adnexal mass. It is slightly smaller and the SUV max has decreased. 3. Persistent hypermetabolic right thyroid lobe mass consistent with known thyroid neoplasm. No neck adenopathy. Electronically Signed   By: Marijo Sanes M.D.   On: 05/09/2021 15:31      ASSESSMENT & PLAN:  1. 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[place])   2. Follicular neoplasm of thyroid   3. Other vitamin B12 deficiency anemia   4. Normocytic anemia    Cancer Staging  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[place]) Staging form: Cervix Uteri, AJCC Version 9 - Clinical: Stage IIIC1 (cT2b, cN1, cM0) - Signed by Earlie Server, MD on 12/29/2020   #Locally advanced cervical cancer.  Stage III Not a surgical candidate. Currently on concurrent chemotherapy and radiation.  S/p concurrent chemotherapy radiation  Clinically she is doing well.  She recently finished intracavitary RT at [facility].  Interval PET showed  excellent response to treatment involving the uterus and cervix. No residual hypermetabolic tumor is identified. Persistent partially cystic right adnexal mass. It is slightly smaller and the SUV max has decreased. Follow up with [facility] Cath in place due to lack of IV access. Continue port flush Q8weeks x 6  # Anemia, stable hemoglobin 10.6. # Vitamin B12 deficency.  B12 today is pending at the time of dictation.,  Proceed with vitamin B12 injection empirically  # Thyroid nodule, biopsy positive for follicular neoplasm, high probability - I ask patient to follow up with ENT for plans of resection. Communicated with Dr.Vaught.  Another nodule needs to be monitored in 1 year, I will defer to ENT.   All questions were answered. The patient knows to call the clinic with any problems questions or concerns.  cc Center, [place]    Return of visit:  TBD   Earlie Server, MD, PhD 05/17/2021

## 2021-05-17 NOTE — Progress Notes (Signed)
Pt here for follow up. She reports that she was told by ENT that she has throat cancer.

## 2021-05-21 ENCOUNTER — Telehealth: Payer: Self-pay

## 2021-05-21 NOTE — Telephone Encounter (Signed)
Called and spoke with daughter, Baker Janus, regarding follow up for thyroid surgery. She states she did decide to have surgery locally and that she will have her call and set up the appointment with Dr. Pryor Ochoa.

## 2021-06-14 ENCOUNTER — Other Ambulatory Visit: Payer: Self-pay

## 2021-06-14 ENCOUNTER — Inpatient Hospital Stay: Payer: Medicare Other | Attending: Oncology

## 2021-06-14 DIAGNOSIS — E538 Deficiency of other specified B group vitamins: Secondary | ICD-10-CM | POA: Diagnosis not present

## 2021-06-14 DIAGNOSIS — D518 Other vitamin B12 deficiency anemias: Secondary | ICD-10-CM

## 2021-06-14 MED ORDER — CYANOCOBALAMIN 1000 MCG/ML IJ SOLN
1000.0000 ug | INTRAMUSCULAR | Status: DC
  Administered 2021-06-14: 1000 ug via INTRAMUSCULAR
  Filled 2021-06-14: qty 1

## 2021-06-28 ENCOUNTER — Inpatient Hospital Stay: Payer: Medicare Other

## 2021-07-11 ENCOUNTER — Inpatient Hospital Stay (HOSPITAL_BASED_OUTPATIENT_CLINIC_OR_DEPARTMENT_OTHER): Payer: Medicare Other | Admitting: Obstetrics and Gynecology

## 2021-07-11 ENCOUNTER — Other Ambulatory Visit: Payer: Self-pay

## 2021-07-11 ENCOUNTER — Inpatient Hospital Stay: Payer: Medicare Other | Attending: Oncology

## 2021-07-11 ENCOUNTER — Inpatient Hospital Stay: Payer: Medicare Other

## 2021-07-11 VITALS — BP 154/88 | HR 77 | Temp 98.7°F | Resp 20 | Wt 127.6 lb

## 2021-07-11 DIAGNOSIS — I5032 Chronic diastolic (congestive) heart failure: Secondary | ICD-10-CM | POA: Insufficient documentation

## 2021-07-11 DIAGNOSIS — D519 Vitamin B12 deficiency anemia, unspecified: Secondary | ICD-10-CM | POA: Insufficient documentation

## 2021-07-11 DIAGNOSIS — Z923 Personal history of irradiation: Secondary | ICD-10-CM | POA: Insufficient documentation

## 2021-07-11 DIAGNOSIS — E1140 Type 2 diabetes mellitus with diabetic neuropathy, unspecified: Secondary | ICD-10-CM | POA: Insufficient documentation

## 2021-07-11 DIAGNOSIS — D518 Other vitamin B12 deficiency anemias: Secondary | ICD-10-CM

## 2021-07-11 DIAGNOSIS — I110 Hypertensive heart disease with heart failure: Secondary | ICD-10-CM | POA: Insufficient documentation

## 2021-07-11 DIAGNOSIS — Z95828 Presence of other vascular implants and grafts: Secondary | ICD-10-CM

## 2021-07-11 DIAGNOSIS — D3911 Neoplasm of uncertain behavior of right ovary: Secondary | ICD-10-CM | POA: Insufficient documentation

## 2021-07-11 DIAGNOSIS — Z9221 Personal history of antineoplastic chemotherapy: Secondary | ICD-10-CM | POA: Diagnosis not present

## 2021-07-11 DIAGNOSIS — F1490 Cocaine use, unspecified, uncomplicated: Secondary | ICD-10-CM | POA: Insufficient documentation

## 2021-07-11 DIAGNOSIS — Z8541 Personal history of malignant neoplasm of cervix uteri: Secondary | ICD-10-CM | POA: Insufficient documentation

## 2021-07-11 DIAGNOSIS — C539 Malignant neoplasm of cervix uteri, unspecified: Secondary | ICD-10-CM

## 2021-07-11 MED ORDER — HEPARIN SOD (PORK) LOCK FLUSH 100 UNIT/ML IV SOLN
500.0000 [IU] | Freq: Once | INTRAVENOUS | Status: AC
  Administered 2021-07-11: 500 [IU] via INTRAVENOUS
  Filled 2021-07-11: qty 5

## 2021-07-11 MED ORDER — CYANOCOBALAMIN 1000 MCG/ML IJ SOLN
1000.0000 ug | INTRAMUSCULAR | Status: DC
  Administered 2021-07-11: 1000 ug via INTRAMUSCULAR
  Filled 2021-07-11: qty 1

## 2021-07-11 MED ORDER — SODIUM CHLORIDE 0.9% FLUSH
10.0000 mL | Freq: Once | INTRAVENOUS | Status: AC
  Administered 2021-07-11: 10 mL via INTRAVENOUS
  Filled 2021-07-11: qty 10

## 2021-07-11 NOTE — Progress Notes (Signed)
Gynecologic Oncology Consult Visit  ? ?Referring Provider: ?Boykin Nearing, MD  ? ?Chief Concern: cervical cancer ? ?Subjective:  ?Rebecca Parker is a 71 y.o. G3P3 female who is seen in consultation from Dr. Ouida Sills for cervical cancer now s/p chemo and radiation completed 02/08/21.  ? ?She presents today for follow up after EBRT. She has no complaints.  ? ?She saw Dr. Delight Ovens based on findings of previous biopsy (below) and pet (above). She has total thyroidectomy scheduled with Dr. Pryor Ochoa on 07/24/21.  ? ?She had thyroid biopsy on 03/05/21.  ?A. THYROID GLAND, RIGHT MIDDLE LOBE; ULTRASOUND-GUIDED FINE-NEEDLE ASPIRATION:  ?- SUSPICIOUS FOR FOLLICULAR NEOPLASM (BETHESDA CATEGORY IV).  ? ?She was seen by ENT on 02/13/2021 and there was no evidence of tonsillary or ENT cancer on exam. HIV test negative 11/2020. ? ?Gynecologic Oncology History:  ?12/01/2020-8/9/022 She was admitted from the ED for 1 day h/o right lower abdominal / pelvic pain . CT scan revealed a right ovarian cyst 5 cm and air in endometrial cavity. Bedside exam revealed a firm cervical rim and purulent d/c. WBC 23K concern for pyometra.   ? ?12/01/2020 Fx D+C  and cervical bx. Uterus sounded to 9 cm.  ?Pathology- squamous cell CA of the cervix with cancer in ECC and endometrial curettings  ? ?TVUS POD#1 showed a complex right ovarian cyst with normal doppler flow.  ? ?Initally she was thought of urosepsis however neg urine culture. In retrospect, urine was probably contaminated with purulent cervical discharge. She was treated with gent and Cleocin switched to oral Flagyl and is currently on Augmentin. Clinically patient's pain improved with IV/ PO abx . Hospitalist helped managed DM and CHF ( no significant tx) She was also diagnosed with anemia. Hypoalbuminemia, and thrombocytosis. Her tox screen was positive for cocaine she says she only uses occasionally.   ? ?On pelvic exam suspect lateral vaginal disease >50% down the vault and disease  involving the left fornix. Cervix enlarged >4 cm with grossly obvious tumor and hard to palpation. Uterus - may be enlarged with firm mass on the right aspect versus palpation of the right adnexal mass. Positive parametrial involvement on the right and on the left with disease to or almost to the sidewall on the left. Rectovaginal exam was confirmatory.   ?  ? 8/31 2022, PET scan showed signs of cervical cancer with diffuse involvement of the uterus.  Cystic and solid right ovarian lesion more likely related to diffuse cervical cancer.  Concomitant synchronous cystic ovarian neoplasm could have appearance but feel less likely given the diffuse nature of disease throughout the uterus and cervix.  Small lymph nodes in the pelvis in the left pelvis suspicious for nodal involvement at the common iliac level.  No solid organ distant metastasis. ? ?Treated with external radiation therapy with weekly cisplatin.   ?She had pet for restaging on 05/09/21 which showed excellent response to treatment. No residual hypermetabolic tumor was identified. Persistent partially cystic right adnexal mass. Slightly smaller and SUV has decreased. Persistent known thyroid neoplasm (see below biopsy).  ? ?Then received brachytherapy with Dr Christel Mormon at [facility] in 1/23.   ? ?Problem List: ?Patient Active Problem List  ? Diagnosis Date Noted  ? Port-A-Cath in place 01/17/2021  ? Abnormal positron emission tomography (PET) scan 01/17/2021  ? Tobacco use 01/03/2021  ? Encounter for antineoplastic chemotherapy 01/03/2021  ? Vitamin B12 deficiency 01/03/2021  ? B12 deficiency anemia 01/03/2021  ? Normocytic anemia 12/23/2020  ? Invasive carcinoma of  cervix ([place]) 12/23/2020  ? Goals of care, counseling/discussion 12/23/2020  ? Cocaine abuse ([place]) 12/23/2020  ? Sepsis ([place]) 12/01/2020  ? Ovarian mass, right 12/01/2020  ? Acute lower UTI 12/01/2020  ? Pelvic pain 12/01/2020  ? Morbid (severe) obesity due to excess calories ([place]) 11/15/2015  ? Mixed  incontinence 10/24/2015  ? Venous insufficiency of both lower extremities 11/24/2014  ? Benign essential hypertension 11/18/2014  ? Chronic diastolic CHF (congestive heart failure), NYHA class 2 ([place]) 05/17/2014  ? Mixed hyperlipidemia 05/17/2014  ? Tobacco dependence 02/10/2014  ? Thyroid nodule 02/10/2014  ? Spinal stenosis, lumbar region without neurogenic claudication 03/05/2013  ? Constipation 01/29/2013  ? Eosinophil count raised 10/20/2012  ? Urinary incontinence 10/19/2012  ? Type 2 diabetes mellitus without complications ([place]) 51/76/1607  ? Neck pain 06/15/2012  ? Polyneuropathy 11/15/2011  ? Palmar fascial fibromatosis 09/13/2011  ? Edema 05/29/2011  ? Vitamin D deficiency 07/31/2009  ? Insomnia 07/27/2009  ? ?Past Medical History: ?Past Medical History:  ?Diagnosis Date  ? B12 deficiency anemia 01/03/2021  ? Chest pain, unspecified   ? CHF (congestive heart failure) ([place])   ? Diabetes mellitus without complication ([place])   ? Hip pain   ? Hypertension   ? Multinodular goiter   ? ? ?Past Surgical History: ?Past Surgical History:  ?Procedure Laterality Date  ? COLONOSCOPY WITH PROPOFOL N/A 11/24/2015  ? Procedure: COLONOSCOPY WITH PROPOFOL;  Surgeon: Lollie Sails, MD;  Location: [facility] ENDOSCOPY;  Service: Endoscopy;  Laterality: N/A;  ? COLONOSCOPY WITH PROPOFOL N/A 03/18/2019  ? Procedure: COLONOSCOPY WITH PROPOFOL;  Surgeon: Virgel Manifold, MD;  Location: [facility] ENDOSCOPY;  Service: Endoscopy;  Laterality: N/A;  ? DILATION AND CURETTAGE OF UTERUS N/A 12/01/2020  ? Procedure: DILATATION AND CURETTAGE with cervical biopsies;  Surgeon: Schermerhorn, Gwen Her, MD;  Location: [facility] ORS;  Service: Gynecology;  Laterality: N/A;  ? ESOPHAGOGASTRODUODENOSCOPY (EGD) WITH PROPOFOL N/A 03/18/2019  ? Procedure: ESOPHAGOGASTRODUODENOSCOPY (EGD) WITH PROPOFOL;  Surgeon: Virgel Manifold, MD;  Location: [facility] ENDOSCOPY;  Service: Endoscopy;  Laterality: N/A;  ? PORTA CATH INSERTION N/A 01/16/2021  ? Procedure: PORTA  CATH INSERTION;  Surgeon: Katha Cabal, MD;  Location: [place] CV LAB;  Service: Cardiovascular;  Laterality: N/A;  ? right ankle orif    ? ? ?Past Gynecologic History:  ?As per HPI. She does not recall dates of menarche, LMP. She does have a h/o abnormal Pap, but last Pap not listed.  ? ?OB History:  ?OB History  ?Gravida Para Term Preterm AB Living  ?3 3          ?SAB IAB Ectopic Multiple Live Births  ?           ?  ?# Outcome Date GA Lbr Len/2nd Weight Sex Delivery Anes PTL Lv  ?3 Para           ?2 Para           ?1 Para           ? ? ?Family History: ?Family History  ?Problem Relation Age of Onset  ? Breast cancer Maternal Aunt   ? Breast cancer Paternal Aunt   ? ? ?Social History: ?Social History  ? ?Socioeconomic History  ? Marital status: Legally Separated  ?  Spouse name: Not on file  ? Number of children: Not on file  ? Years of education: Not on file  ? Highest education level: Not on file  ?Occupational History  ? Not on file  ?Tobacco  Use  ? Smoking status: Every Day  ?  Packs/day: 0.10  ?  Types: Cigarettes  ? Smokeless tobacco: Never  ?Substance and Sexual Activity  ? Alcohol use: Not Currently  ? Drug use: Yes  ?  Types: Cocaine  ? Sexual activity: Not Currently  ?Other Topics Concern  ? Not on file  ?Social History Narrative  ? Not on file  ? ?Social Determinants of Health  ? ?Financial Resource Strain: Not on file  ?Food Insecurity: Not on file  ?Transportation Needs: Not on file  ?Physical Activity: Not on file  ?Stress: Not on file  ?Social Connections: Not on file  ?Intimate Partner Violence: Not on file  ? ? ?Allergies: ?Allergies  ?Allergen Reactions  ? Ace Inhibitors Itching  ?  Rash  ? Cyclobenzaprine Itching  ?  Rash and 'made me nervous'  ? ? ?Current Medications: ?Current Outpatient Medications  ?Medication Sig Dispense Refill  ? aspirin EC 81 MG tablet Take 81 mg by mouth daily.    ? Calcium Carbonate-Vitamin D 500-125 MG-UNIT TABS Take 1 tablet by mouth daily.    ? docusate  sodium (COLACE) 100 MG capsule Take 100 mg by mouth daily.    ? Ferrous Sulfate (IRON) 325 (65 Fe) MG TABS Take 1 tablet by mouth 2 (two) times daily.    ? furosemide (LASIX) 20 MG tablet Take 20 mg by mouth 2

## 2021-07-12 ENCOUNTER — Inpatient Hospital Stay: Payer: Medicare Other

## 2021-07-17 ENCOUNTER — Encounter: Admission: RE | Admit: 2021-07-17 | Payer: Medicare Other | Source: Ambulatory Visit

## 2021-07-19 ENCOUNTER — Other Ambulatory Visit
Admission: RE | Admit: 2021-07-19 | Discharge: 2021-07-19 | Disposition: A | Discharge status: Home / Self Care | Payer: Medicare Other | Source: Ambulatory Visit | Attending: Otolaryngology | Admitting: Otolaryngology

## 2021-07-19 ENCOUNTER — Ambulatory Visit: Payer: Medicare Other | Admitting: Urgent Care

## 2021-07-19 ENCOUNTER — Other Ambulatory Visit: Payer: Self-pay

## 2021-07-19 ENCOUNTER — Encounter: Payer: Self-pay | Admitting: Otolaryngology

## 2021-07-19 VITALS — Ht 61.0 in | Wt 122.0 lb

## 2021-07-19 DIAGNOSIS — F1410 Cocaine abuse, uncomplicated: Secondary | ICD-10-CM

## 2021-07-19 DIAGNOSIS — Z01812 Encounter for preprocedural laboratory examination: Secondary | ICD-10-CM

## 2021-07-19 NOTE — Patient Instructions (Addendum)
?Your procedure is scheduled on: Tuesday July 24, 2021. ?Report to Day Surgery inside [facility] 2nd floor, stop by admissions desk before getting on elevator.  ?To find out your arrival time please call [phone] between 1PM - 3PM on Monday July 23, 2021. ? ?Remember: Instructions that are not followed completely may result in serious medical risk,  ?up to and including death, or upon the discretion of your surgeon and anesthesiologist your  ?surgery may need to be rescheduled.  ? ?  _X__ 1. Do not eat food after midnight the night before your procedure. ?                No chewing gum or hard candies. You may drink clear liquids up to 2 hours ?                before you are scheduled to arrive for your surgery- DO not drink clear ?                liquids within 2 hours of the start of your surgery. ?                Clear Liquids include:  water, apple juice without pulp, clear Gatorade, G2 or  ?                Gatorade Zero (avoid Red/Purple/Blue), Black Coffee or Tea (Do not add ?                anything to coffee or tea). ? ?__X__2.  On the morning of surgery brush your teeth with toothpaste and water, you ?               may rinse your mouth with mouthwash if you wish.  Do not swallow any toothpaste or mouthwash. ?   ? _X__ 3.  No Alcohol for 24 hours before or after surgery. ? ? _X__ 4.  Do Not Smoke or use e-cigarettes For 24 Hours Prior to Your Surgery. ?                Do not use any chewable tobacco products for at least 6 hours prior to ?                Surgery. ? ?_X__  5.  Do not use any recreational drugs (marijuana, cocaine, heroin, ecstasy, MDMA or other) ?               For at least one week prior to your surgery.  Combination of these drugs with anesthesia ?               May have life threatening results. ? ?____  6.  Bring all medications with you on the day of surgery if instructed.  ? ?__X__  7.  Notify your doctor if there is any change in your medical condition   ?    (cold, fever, infections). ?    ?Do not wear jewelry, make-up, hairpins, clips or nail polish. ?Do not wear lotions, powders, or perfumes. You may wear deodorant. ?Do not shave 48 hours prior to surgery. Men may shave face and neck. ?Do not bring valuables to the hospital.   ? ?[facility] is not responsible for any belongings or valuables. ? ?Contacts, dentures or bridgework may not be worn into surgery. ?Leave your suitcase in the car. After surgery it may be brought to your room. ?For patients admitted to the hospital, discharge time is  determined by your ?treatment team. ?  ?Patients discharged the day of surgery will not be allowed to drive home.   ?Make arrangements for someone to be with you for the first 24 hours of your ?Same Day Discharge. ? ? ?__X__ Take these medicines the morning of surgery with A SIP OF WATER:  ? ? 1. hydrOXYzine (ATARAX/VISTARIL) 25 MG ? 2.   ? 3.  ? 4. ? 5. ? 6. ? ?____ Fleet Enema (as directed)  ? ?__X__ Use CHG Soap (or wipes) as directed ? ?____ Use Benzoyl Peroxide Gel as instructed ? ?____ Use inhalers on the day of surgery ? ?__X__ Stop metFORMIN (GLUCOPHAGE) 500 MG 2 days prior to surgery (take last dose 07/21/21)   ? ?____ Take 1/2 of usual insulin dose the night before surgery. No insulin the morning ?         of surgery.  ? ?__X__ Stop aspirin 81 mg 5 days prior to surgery as instructed by surgeon. (Take last dose 07/19/21)  ? ?__X__ One Week prior to surgery- Stop Anti-inflammatories such as Ibuprofen, Aleve, Advil, Motrin, meloxicam (MOBIC), diclofenac, etodolac, ketorolac, Toradol, Daypro, piroxicam, Goody's or BC powders. OK TO USE TYLENOL IF NEEDED ?  ?__X__ Stop supplements until after surgery.   ? ?____ Bring C-Pap to the hospital.  ? ? ?If you have any questions regarding your pre-procedure instructions,  ?Please call Pre-admit Testing at [phone] ?

## 2021-07-23 MED ORDER — CHLORHEXIDINE GLUCONATE 0.12 % MT SOLN
15.0000 mL | Freq: Once | OROMUCOSAL | Status: AC
  Administered 2021-07-24: 15 mL via OROMUCOSAL

## 2021-07-23 MED ORDER — FAMOTIDINE 20 MG PO TABS
20.0000 mg | ORAL_TABLET | Freq: Once | ORAL | Status: AC
  Administered 2021-07-24: 20 mg via ORAL

## 2021-07-23 MED ORDER — ORAL CARE MOUTH RINSE: 15.0000 mL | Freq: Once | OROMUCOSAL | Status: AC

## 2021-07-23 MED ORDER — SODIUM CHLORIDE 0.9 % IV SOLN: Freq: Once | INTRAVENOUS | Status: DC

## 2021-07-24 ENCOUNTER — Encounter
Admission: RE | Disposition: A | Discharge status: Home / Self Care | Payer: Self-pay | Source: Home / Self Care | Attending: Otolaryngology

## 2021-07-24 ENCOUNTER — Encounter: Payer: Self-pay | Admitting: Otolaryngology

## 2021-07-24 ENCOUNTER — Ambulatory Visit
Admission: RE | Admit: 2021-07-24 | Discharge: 2021-07-24 | Disposition: A | Discharge status: Home / Self Care | Payer: Medicare Other | Attending: Otolaryngology | Admitting: Otolaryngology

## 2021-07-24 ENCOUNTER — Other Ambulatory Visit: Payer: Self-pay

## 2021-07-24 DIAGNOSIS — F17210 Nicotine dependence, cigarettes, uncomplicated: Secondary | ICD-10-CM | POA: Diagnosis not present

## 2021-07-24 DIAGNOSIS — Z5309 Procedure and treatment not carried out because of other contraindication: Secondary | ICD-10-CM | POA: Insufficient documentation

## 2021-07-24 DIAGNOSIS — F1410 Cocaine abuse, uncomplicated: Secondary | ICD-10-CM | POA: Diagnosis not present

## 2021-07-24 DIAGNOSIS — R825 Elevated urine levels of drugs, medicaments and biological substances: Secondary | ICD-10-CM | POA: Diagnosis not present

## 2021-07-24 DIAGNOSIS — Z01812 Encounter for preprocedural laboratory examination: Secondary | ICD-10-CM

## 2021-07-24 DIAGNOSIS — F1490 Cocaine use, unspecified, uncomplicated: Secondary | ICD-10-CM | POA: Insufficient documentation

## 2021-07-24 DIAGNOSIS — I509 Heart failure, unspecified: Secondary | ICD-10-CM | POA: Diagnosis not present

## 2021-07-24 DIAGNOSIS — Z8541 Personal history of malignant neoplasm of cervix uteri: Secondary | ICD-10-CM | POA: Insufficient documentation

## 2021-07-24 DIAGNOSIS — E041 Nontoxic single thyroid nodule: Secondary | ICD-10-CM | POA: Diagnosis present

## 2021-07-24 DIAGNOSIS — D759 Disease of blood and blood-forming organs, unspecified: Secondary | ICD-10-CM | POA: Insufficient documentation

## 2021-07-24 DIAGNOSIS — I110 Hypertensive heart disease with heart failure: Secondary | ICD-10-CM | POA: Insufficient documentation

## 2021-07-24 DIAGNOSIS — D649 Anemia, unspecified: Secondary | ICD-10-CM | POA: Diagnosis not present

## 2021-07-24 DIAGNOSIS — E119 Type 2 diabetes mellitus without complications: Secondary | ICD-10-CM | POA: Diagnosis not present

## 2021-07-24 DIAGNOSIS — F17200 Nicotine dependence, unspecified, uncomplicated: Secondary | ICD-10-CM | POA: Insufficient documentation

## 2021-07-24 DIAGNOSIS — I081 Rheumatic disorders of both mitral and tricuspid valves: Secondary | ICD-10-CM | POA: Insufficient documentation

## 2021-07-24 HISTORY — DX: Malignant neoplasm of cervix uteri, unspecified: C53.9

## 2021-07-24 HISTORY — DX: Cocaine use, unspecified, uncomplicated: F14.90

## 2021-07-24 HISTORY — DX: Hyperlipidemia, unspecified: E78.5

## 2021-07-24 HISTORY — DX: Disorder of thyroid, unspecified: E07.9

## 2021-07-24 HISTORY — DX: Anemia, unspecified: D64.9

## 2021-07-24 HISTORY — DX: Polyp of colon: K63.5

## 2021-07-24 HISTORY — DX: Benign neoplasm of colon, unspecified: D12.6

## 2021-07-24 LAB — URINE DRUG SCREEN, QUALITATIVE (ARMC ONLY)
Amphetamines, Ur Screen: NOT DETECTED
Barbiturates, Ur Screen: NOT DETECTED
Benzodiazepine, Ur Scrn: NOT DETECTED
Cannabinoid 50 Ng, Ur ~~LOC~~: NOT DETECTED
Cocaine Metabolite,Ur ~~LOC~~: POSITIVE — AB
MDMA (Ecstasy)Ur Screen: NOT DETECTED
Methadone Scn, Ur: NOT DETECTED
Opiate, Ur Screen: NOT DETECTED
Phencyclidine (PCP) Ur S: NOT DETECTED
Tricyclic, Ur Screen: NOT DETECTED

## 2021-07-24 LAB — GLUCOSE, CAPILLARY: Glucose-Capillary: 98 mg/dL (ref 70–99)

## 2021-07-24 SURGERY — THYROIDECTOMY
Anesthesia: General | Laterality: Bilateral

## 2021-07-24 MED ORDER — MIDAZOLAM HCL 2 MG/2ML IJ SOLN
INTRAMUSCULAR | Status: AC
  Filled 2021-07-24: qty 2

## 2021-07-24 MED ORDER — FAMOTIDINE 20 MG PO TABS
ORAL_TABLET | ORAL | Status: AC
  Filled 2021-07-24: qty 1

## 2021-07-24 MED ORDER — LIDOCAINE HCL (PF) 2 % IJ SOLN
INTRAMUSCULAR | Status: AC
  Filled 2021-07-24: qty 5

## 2021-07-24 MED ORDER — REMIFENTANIL HCL 1 MG IV SOLR
INTRAVENOUS | Status: AC
  Filled 2021-07-24: qty 1000

## 2021-07-24 MED ORDER — CHLORHEXIDINE GLUCONATE 0.12 % MT SOLN
OROMUCOSAL | Status: AC
  Filled 2021-07-24: qty 15

## 2021-07-24 MED ORDER — GLYCOPYRROLATE 0.2 MG/ML IJ SOLN
INTRAMUSCULAR | Status: AC
  Filled 2021-07-24: qty 1

## 2021-07-24 MED ORDER — SODIUM CHLORIDE FLUSH 0.9 % IV SOLN
INTRAVENOUS | Status: DC
Start: 2021-07-24 — End: 2021-07-24
  Filled 2021-07-24: qty 10

## 2021-07-24 MED ORDER — ONDANSETRON HCL 4 MG/2ML IJ SOLN
INTRAMUSCULAR | Status: AC
  Filled 2021-07-24: qty 2

## 2021-07-24 MED ORDER — ROCURONIUM BROMIDE 10 MG/ML (PF) SYRINGE
PREFILLED_SYRINGE | INTRAVENOUS | Status: AC
  Filled 2021-07-24: qty 10

## 2021-07-24 MED ORDER — PROPOFOL 10 MG/ML IV BOLUS
INTRAVENOUS | Status: AC
  Filled 2021-07-24: qty 40

## 2021-07-24 MED ORDER — PHENYLEPHRINE HCL-NACL 20-0.9 MG/250ML-% IV SOLN
INTRAVENOUS | Status: AC
  Filled 2021-07-24: qty 250

## 2021-07-24 MED ORDER — FENTANYL CITRATE (PF) 100 MCG/2ML IJ SOLN
INTRAMUSCULAR | Status: AC
  Filled 2021-07-24: qty 2

## 2021-07-24 MED ORDER — DEXAMETHASONE SODIUM PHOSPHATE 10 MG/ML IJ SOLN
INTRAMUSCULAR | Status: AC
  Filled 2021-07-24: qty 1

## 2021-07-24 MED ORDER — BUPIVACAINE-EPINEPHRINE (PF) 0.25% -1:200000 IJ SOLN
INTRAMUSCULAR | Status: AC
  Filled 2021-07-24: qty 30

## 2021-07-24 MED ORDER — SUCCINYLCHOLINE CHLORIDE 200 MG/10ML IV SOSY
PREFILLED_SYRINGE | INTRAVENOUS | Status: AC
  Filled 2021-07-24: qty 10

## 2021-07-24 SURGICAL SUPPLY — 42 items
BLADE SURG 15 STRL LF DISP TIS (BLADE) ×1 IMPLANT
BLADE SURG 15 STRL SS (BLADE) ×1
BULB RESERV EVAC DRAIN JP 100C (MISCELLANEOUS) IMPLANT
CORD BIP STRL DISP 12FT (MISCELLANEOUS) ×2 IMPLANT
DERMABOND ADVANCED (GAUZE/BANDAGES/DRESSINGS)
DERMABOND ADVANCED .7 DNX12 (GAUZE/BANDAGES/DRESSINGS) IMPLANT
DRAIN JP 10F RND SILICONE (MISCELLANEOUS) IMPLANT
DRAPE MAG INST 16X20 L/F (DRAPES) ×2 IMPLANT
DRSG TEGADERM 2-3/8X2-3/4 SM (GAUZE/BANDAGES/DRESSINGS) IMPLANT
ELECT CAUTERY BLADE TIP 2.5 (TIP) ×2
ELECT LARYNGEAL DUAL CHAN (ELECTRODE) ×2 IMPLANT
ELECT NEEDLE 20X.3 GREEN (MISCELLANEOUS) ×2
ELECT REM PT RETURN 9FT ADLT (ELECTROSURGICAL) ×2
ELECTRODE CAUTERY BLDE TIP 2.5 (TIP) ×1 IMPLANT
ELECTRODE NEEDLE 20X.3 GREEN (MISCELLANEOUS) ×1 IMPLANT
ELECTRODE REM PT RTRN 9FT ADLT (ELECTROSURGICAL) ×1 IMPLANT
FORCEPS JEWEL BIP 4-3/4 STR (INSTRUMENTS) ×2 IMPLANT
GAUZE 4X4 16PLY ~~LOC~~+RFID DBL (SPONGE) ×2 IMPLANT
GLOVE SURG ENC MOIS LTX SZ7.5 (GLOVE) ×4 IMPLANT
GOWN STRL REUS W/ TWL LRG LVL3 (GOWN DISPOSABLE) ×3 IMPLANT
GOWN STRL REUS W/TWL LRG LVL3 (GOWN DISPOSABLE) ×3
HEMOSTAT SURGICEL 2X3 (HEMOSTASIS) ×2 IMPLANT
HOOK STAY BLUNT/RETRACTOR 5M (MISCELLANEOUS) IMPLANT
KIT TURNOVER KIT A (KITS) ×2 IMPLANT
LABEL OR SOLS (LABEL) ×2 IMPLANT
MANIFOLD NEPTUNE II (INSTRUMENTS) ×2 IMPLANT
NS IRRIG 500ML POUR BTL (IV SOLUTION) ×2 IMPLANT
PACK HEAD/NECK (MISCELLANEOUS) ×2 IMPLANT
PROBE NEUROSIGN BIPOL (MISCELLANEOUS) ×1 IMPLANT
PROBE NEUROSIGN BIPOLAR (MISCELLANEOUS) ×1
SHEARS HARMONIC 9CM CVD (BLADE) ×2 IMPLANT
SOL PREP PVP 2OZ (MISCELLANEOUS) ×2
SOLUTION PREP PVP 2OZ (MISCELLANEOUS) ×1 IMPLANT
SPONGE KITTNER 5P (MISCELLANEOUS) ×2 IMPLANT
STRIP CLOSURE SKIN 1/4X4 (GAUZE/BANDAGES/DRESSINGS) IMPLANT
SUT PROLENE 6 0 P 1 18 (SUTURE) ×2 IMPLANT
SUT SILK 2 0 (SUTURE) ×1
SUT SILK 2 0 SH (SUTURE) ×2 IMPLANT
SUT SILK 2-0 18XBRD TIE 12 (SUTURE) ×1 IMPLANT
SUT VIC AB 4-0 RB1 18 (SUTURE) ×2 IMPLANT
SYSTEM CHEST DRAIN TLS 7FR (DRAIN) IMPLANT
WATER STERILE IRR 500ML POUR (IV SOLUTION) ×2 IMPLANT

## 2021-07-24 NOTE — H&P (Signed)
..  History and Physical paper copy reviewed and updated date of procedure and will be scanned into system.  Patient seen and examined and marked.  

## 2021-07-24 NOTE — Anesthesia Preprocedure Evaluation (Signed)
Anesthesia Evaluation  ? ? ?Airway ? ? ? ? ? ? ? Dental ?  ?Pulmonary ?Current Smoker,  ?  ? ? ? ? ? ? ? Cardiovascular ?hypertension, +CHF  ? ? ?ECG 02/06/21: SR with PACs, low voltage ? ?Echo 12/30/17:  ?NORMAL LEFT VENTRICULAR SYSTOLIC FUNCTION WITH AN ESTIMATED EF = >55 %  ?NORMAL RIGHT VENTRICULAR SYSTOLIC FUNCTION  ?MILD MITRAL VALVE INSUFFICIENCY  ?TRACE TRICUSPID VALVE INSUFFICIENCY  ?NO VALVULAR STENOSIS  ?MILD LA ENLARGEMENT  ?MILD LVH ?  ?Neuro/Psych ?Hx cocaine use ?  ? GI/Hepatic ?  ?Endo/Other  ?diabetes, Type 2Suspected thyroid CA ? Renal/GU ?  ? ?  ?Musculoskeletal ? ? Abdominal ?  ?Peds ? Hematology ? ?(+) Blood dyscrasia, anemia ,   ?Anesthesia Other Findings ? ? Reproductive/Obstetrics ?Cervical CA ? ?  ? ? ? ? ? ? ? ? ? ? ? ? ? ?  ?  ? ? ? ? ? ? ? ? ?Anesthesia Physical ?Anesthesia Plan ? ?ASA: 3 ? ?Anesthesia Plan: General  ? ?Post-op Pain Management:   ? ?Induction: Intravenous ? ?PONV Risk Score and Plan: 2 and Ondansetron, Dexamethasone and Treatment may vary due to age or medical condition ? ?Airway Management Planned: Oral ETT ? ?Additional Equipment:  ? ?Intra-op Plan:  ? ?Post-operative Plan: Extubation in OR ? ?Informed Consent: I have reviewed the patients History and Physical, chart, labs and discussed the procedure including the risks, benefits and alternatives for the proposed anesthesia with the patient or authorized representative who has indicated his/her understanding and acceptance.  ? ? ? ?Dental advisory given ? ?Plan Discussed with: CRNA ? ?Anesthesia Plan Comments: (Patient consented for risks of anesthesia including but not limited to:  ?- adverse reactions to medications ?- damage to eyes, teeth, lips or other oral mucosa ?- nerve damage due to positioning  ?- sore throat or hoarseness ?- damage to heart, brain, nerves, lungs, other parts of body or loss of life ? ?Informed patient about role of CRNA in peri- and intra-operative care.   Patient voiced understanding.)  ? ? ? ? ? ? ?Anesthesia Quick Evaluation ? ?

## 2021-07-24 NOTE — Progress Notes (Signed)
Patient positive for urine cocaine.  Discussed with anesthesia who felt that for elective case, it is too potentially dangerous to proceed with surgery at this time.  Discussed with patient who understood.  Surgical scheduling will reach out to patient later today to try and find a new time in next coming weeks.  Patient understands and surgery will be rescheduled. ?

## 2021-07-30 ENCOUNTER — Inpatient Hospital Stay: Payer: Medicare Other | Attending: Oncology | Admitting: Nurse Practitioner

## 2021-07-30 ENCOUNTER — Encounter: Payer: Self-pay | Admitting: Nurse Practitioner

## 2021-07-30 VITALS — BP 127/103 | HR 90 | Temp 98.7°F | Resp 20

## 2021-07-30 DIAGNOSIS — B9689 Other specified bacterial agents as the cause of diseases classified elsewhere: Secondary | ICD-10-CM | POA: Diagnosis not present

## 2021-07-30 DIAGNOSIS — Z79899 Other long term (current) drug therapy: Secondary | ICD-10-CM | POA: Insufficient documentation

## 2021-07-30 DIAGNOSIS — F17210 Nicotine dependence, cigarettes, uncomplicated: Secondary | ICD-10-CM | POA: Insufficient documentation

## 2021-07-30 DIAGNOSIS — N898 Other specified noninflammatory disorders of vagina: Secondary | ICD-10-CM | POA: Diagnosis not present

## 2021-07-30 DIAGNOSIS — N760 Acute vaginitis: Secondary | ICD-10-CM | POA: Insufficient documentation

## 2021-07-30 DIAGNOSIS — C539 Malignant neoplasm of cervix uteri, unspecified: Secondary | ICD-10-CM | POA: Diagnosis present

## 2021-07-30 LAB — WET PREP, GENITAL
Clue Cells Wet Prep HPF POC: NONE SEEN
Sperm: NONE SEEN
Trich, Wet Prep: NONE SEEN
WBC, Wet Prep HPF POC: >=10 — AB (ref <10)
Yeast Wet Prep HPF POC: NONE SEEN

## 2021-07-30 LAB — CHLAMYDIA/NGC RT PCR (ARMC ONLY)
Chlamydia Tr: NOT DETECTED
N gonorrhoeae: NOT DETECTED

## 2021-07-30 MED ORDER — METRONIDAZOLE 500 MG PO TABS: 500.0000 mg | ORAL_TABLET | Freq: Two times a day (BID) | ORAL | 0 refills | Status: AC

## 2021-07-30 NOTE — Progress Notes (Signed)
Pt arrived at clinic stating she needed to be seen for vaginal discharge that is yellow and green in appearance and "smells like death".  ?

## 2021-07-30 NOTE — Progress Notes (Signed)
? ?Sympt[facility] Management Clinic ? ?[facility] ?[address] ?[place], [place] [address] ?[phone] (phone) ?[phone] (fax) ? ?Patient Care Team: ?[facility] as PCP - General (General Practice) ?Clent Jacks, RN as Oncology Nurse Navigator ?Earlie Server, MD as Consulting Physician (Oncology) ?Earlie Server, MD as Consulting Physician (Hematology and Oncology)  ? ?Name of the patient: Rebecca Parker  ?[id number]  ?15-Jul-1950  ? ?Date of visit: 07/30/21 ? ?Diagnosis- Cervical Cancer ? ?Chief complaint/ Reason for visit- Malodorous vaginal discharge ? ?Heme/Onc history:  ?Oncology History  ?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[place])  ?12/23/2020 Initial Diagnosis  ? 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[place]) ?  ?12/23/2020 Cancer Staging  ? Staging form: Cervix Uteri, AJCC Version 9 ?- Clinical: Stage IIIC1 (cT2b, cN1, cM0) - Signed by Earlie Server, MD on 12/29/2020 ?Stage prefix: Initial diagnosis ? ?  ?01/03/2021 - 01/03/2021 Chemotherapy  ?  ? ?  ? ?  ?01/09/2021 - 01/09/2021 Chemotherapy  ?  ? ?  ? ?  ?01/10/2021 - 01/10/2021 Chemotherapy  ?  ? ?  ? ?  ?01/14/2021 - 01/17/2021 Chemotherapy  ?  ? ?  ? ?  ?01/16/2021 - 01/17/2021 Chemotherapy  ?  ? ?  ? ?  ?01/17/2021 -  Chemotherapy  ? Patient is on Treatment Plan :  Cisplatin q7d + XRT  ?   ?03/04/2021 - 03/04/2021 Chemotherapy  ?  ? ?  ? ?  ? ? ?Interval history-patient is 71 year old female diagnosed with locally advanced stage IIIc cervical cancer status post radiation and chemotherapy followed by brachytherapy completed in January 2023.  She underwent pelvic exam with gyn onc on 07/11/21. Was noticed to have mild mucosal necrosis of cervix but no evidence of disease. Today, patient complains of an abnormal vaginal discharge present for 2 weeks and worsening. Describes fish odor, spotting, and discharge. Never had symptoms like this before. Describes discharge as: copious,  yellow, green, thick, and frothy.Menstrual pattern: post menopausal. She tried douche but couldn't insert into vagina. Denies recent sexual activity but would like to be checked for STDs. No pain, fevers.  ? ?Review of systems- Review of Systems  ?Constitutional:  Negative for chills, fever, malaise/fatigue and weight loss.  ?HENT:  Negative for hearing loss, nosebleeds, sore throat and tinnitus.   ?Eyes:  Negative for blurred vision and double vision.  ?Respiratory:  Negative for cough, hemoptysis, shortness of breath and wheezing.   ?Cardiovascular:  Negative for chest pain, palpitations and leg swelling.  ?Gastrointestinal:  Negative for abdominal pain, blood in stool, constipation, diarrhea, melena, nausea and vomiting.  ?Genitourinary:  Negative for dysuria and urgency.  ?Musculoskeletal:  Negative for back pain, falls, joint pain and myalgias.  ?Skin:  Negative for itching and rash.  ?Neurological:  Negative for dizziness, tingling, sensory change, loss of consciousness, weakness and headaches.  ?Endo/Heme/Allergies:  Negative for environmental allergies. Does not bruise/bleed easily.  ?Psychiatric/Behavioral:  Negative for depression. The patient is nervous/anxious. The patient does not have insomnia.    ? ?Current treatment- surveillance ? ?Allergies  ?Allergen Reactions  ? Ace Inhibitors Itching  ?  Rash  ? Cyclobenzaprine Itching  ?  Rash and 'made me nervous'  ? ? ?Past Medical History:  ?Diagnosis Date  ? Anemia   ? B12 deficiency 01/03/2021  ? Cervical cancer ([place])   ? 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 Chest pain, unspecified   ? CHF (congestive heart failure) ([place])   ? Cocaine use   ? Diabetes mellitus without complication ([place])   ? Hip pain   ? HLD (hyperlipidemia)   ? Hyperplastic colon polyp   ? Hypertension   ? Multinodular goiter   ? Thyroid mass   ? 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 Tubular adenoma of colon   ? ? ?Past Surgical History:  ?Procedure Laterality Date  ? ABDOMINAL HYSTERECTOMY    ? COLONOSCOPY WITH PROPOFOL N/A 11/24/2015  ? Procedure: COLONOSCOPY WITH PROPOFOL;  Surgeon: Lollie Sails, MD;  [facility] ENDOSCOPY;  Service: Endoscopy;  Laterality: N/A;  ? COLONOSCOPY WITH PROPOFOL N/A 03/18/2019  ? Procedure: COLONOSCOPY WITH PROPOFOL;  Surgeon: Virgel Manifold, MD;  Location: [facility] ENDOSCOPY;  Service: Endoscopy;  Laterality: N/A;  ? DILATION AND CURETTAGE OF UTERUS N/A 12/01/2020  ? Procedure: DILATATION AND CURETTAGE with cervical biopsies;  Surgeon: Schermerhorn, Gwen Her, MD;  Location: [facility] ORS;  Service: Gynecology;  Laterality: N/A;  ? ESOPHAGOGASTRODUODENOSCOPY (EGD) WITH PROPOFOL N/A 03/18/2019  ? Procedure: ESOPHAGOGASTRODUODENOSCOPY (EGD) WITH PROPOFOL;  Surgeon: Virgel Manifold, MD;  Location: [facility] ENDOSCOPY;  Service: Endoscopy;  Laterality: N/A;  ? PORTA CATH INSERTION N/A 01/16/2021  ? Procedure: PORTA CATH INSERTION;  Surgeon: Katha Cabal, MD;  Location: [facility];  Service: Cardiovascular;  Laterality: N/A;  ? right ankle orif    ? ? ?Social History  ? ?Socioeconomic History  ? Marital status: Legally Separated  ?  Spouse name: Not on file  ? Number of children: Not on file  ? Years of education: Not on file  ? Highest education level: Not on file  ?Occupational History  ? Not on file  ?Tobacco Use  ? Smoking status: Every Day  ?  Packs/day: 0.10  ?  Types: Cigarettes  ? Smokeless tobacco: Never  ?Substance and Sexual Activity  ? Alcohol use: Not Currently  ? Drug use: Yes  ?  Types: Cocaine  ?  Comment: last year  ? Sexual activity: Not Currently  ?Other Topics Concern  ? Not on file  ?Social History Narrative  ? Not on file  ? ?Social Determinants of Health  ? ?Financial Resource Strain: Not on file  ?Food Insecurity: Not on file  ?Transportation Needs: Not on file  ?Physical Activity: Not on file  ?Stress: Not on file   ?Social Connections: Not on file  ?Intimate Partner Violence: Not on file  ? ? ?Family History  ?Problem Relation Age of Onset  ? Breast cancer Maternal Aunt   ? Breast cancer Paternal Aunt   ? ? ? ?Current Outpatient Medications:  ?  aspirin EC 81 MG tablet, Take 81 mg by mouth daily., Disp: , Rfl:  ?  Calcium Carbonate-Vitamin D 500-125 MG-UNIT TABS, Take 1 tablet by mouth daily., Disp: , Rfl:  ?  docusate sodium (COLACE) 100 MG capsule, Take 100 mg by mouth daily., Disp: , Rfl:  ?  Ferrous Sulfate (IRON) 325 (65 Fe) MG TABS, Take 1 tablet by mouth 2 (two) times daily., Disp: , Rfl:  ?  furosemide (LASIX) 20 MG tablet, Take 20 mg by mouth 2 (two) times daily., Disp: , Rfl:  ?  gabapentin (NEURONTIN) 800 MG tablet, Take 800 mg by mouth 2 (two) times daily., Disp: , Rfl:  ?  hydrOXYzine (ATARAX/VISTARIL) 25 MG tablet, Take 25 mg  by mouth daily. Take '25mg'$ . By mouth every morning, Disp: , Rfl:  ?  lidocaine-prilocaine (EMLA) cream, Apply 1 application topically as needed., Disp: , Rfl:  ?  losartan (COZAAR) 50 MG tablet, Take 50 mg by mouth 2 (two) times daily., Disp: , Rfl:  ?  metFORMIN (GLUCOPHAGE) 500 MG tablet, Take 500 mg by mouth 2 (two) times daily with a meal., Disp: , Rfl:  ?  metroNIDAZOLE (FLAGYL) 500 MG tablet, Take 1 tablet (500 mg total) by mouth 2 (two) times daily for 7 days., Disp: 14 tablet, Rfl: 0 ?  mirtazapine (REMERON) 15 MG tablet, Take 15 mg by mouth at bedtime., Disp: , Rfl:  ?  simvastatin (ZOCOR) 20 MG tablet, Take 20 mg by mouth daily., Disp: , Rfl:  ?  ondansetron (ZOFRAN) 4 MG tablet, Take 1 tablet (4 mg total) by mouth every 6 (six) hours as needed for nausea. (Patient not taking: Reported on 02/01/2021), Disp: 20 tablet, Rfl: 0 ?  oxyCODONE (OXY IR/ROXICODONE) 5 MG immediate release tablet, Take 1 tablet (5 mg total) by mouth every 8 (eight) hours as needed for moderate pain. (Patient not taking: Reported on 05/17/2021), Disp: 15 tablet, Rfl: 0 ? ?Physical exam:  ?Vitals:  ? 07/30/21  1148  ?BP: (!) 127/103  ?Pulse: 90  ?Resp: 20  ?Temp: 98.7 ?F (37.1 ?C)  ?SpO2: 100%  ? ?Physical Exam ?Exam conducted with a chaperone present.  ?Constitutional:   ?   Appearance: She is not ill-appearing.  ?

## 2021-08-23 ENCOUNTER — Inpatient Hospital Stay: Payer: Medicare Other

## 2021-08-29 ENCOUNTER — Telehealth: Payer: Self-pay

## 2021-08-29 NOTE — Telephone Encounter (Signed)
Following up on thyroid surgery for Ms. Rebecca Parker. Attempted to call Rebecca Parker to inquire if she has tried to get rescheduled. No answer, phone not working, voicemail full on listed numbers. Message sent to Dr's Vaught/Yu regarding follow up. ?

## 2021-09-03 ENCOUNTER — Other Ambulatory Visit: Payer: Self-pay | Admitting: Nurse Practitioner

## 2021-09-03 DIAGNOSIS — B9689 Other specified bacterial agents as the cause of diseases classified elsewhere: Secondary | ICD-10-CM

## 2021-09-03 MED ORDER — METRONIDAZOLE 0.75 % VA GEL: VAGINAL | 3 refills | Status: DC

## 2021-09-03 MED ORDER — METRONIDAZOLE 500 MG PO TABS: 500.0000 mg | ORAL_TABLET | Freq: Two times a day (BID) | ORAL | 0 refills | Status: AC

## 2021-09-03 NOTE — Progress Notes (Signed)
Patient presents to clinic complaining of vaginal odor and discharge similar to previous symptoms. Recommend treatment for BV with flagyl 500 mg twice a day x 7 days followed by maintenance 0.75% gel, 5 gram dose placed intravaginally at night twice a week for four to six months.  ?Given patient's cancer history, I recommend treating underlying infection then returning to clinic for pelvic exam.  ?

## 2021-09-06 ENCOUNTER — Inpatient Hospital Stay: Payer: Medicare Other | Admitting: Nurse Practitioner

## 2021-09-06 ENCOUNTER — Inpatient Hospital Stay: Payer: Medicare Other

## 2021-09-10 ENCOUNTER — Encounter: Payer: Self-pay | Admitting: Nurse Practitioner

## 2021-09-10 ENCOUNTER — Inpatient Hospital Stay (HOSPITAL_BASED_OUTPATIENT_CLINIC_OR_DEPARTMENT_OTHER): Payer: Medicare Other | Admitting: Nurse Practitioner

## 2021-09-10 ENCOUNTER — Inpatient Hospital Stay: Payer: Medicare Other | Attending: Oncology

## 2021-09-10 VITALS — BP 146/83 | HR 92 | Temp 98.7°F | Resp 20 | Wt 123.3 lb

## 2021-09-10 DIAGNOSIS — Z452 Encounter for adjustment and management of vascular access device: Secondary | ICD-10-CM | POA: Diagnosis not present

## 2021-09-10 DIAGNOSIS — C73 Malignant neoplasm of thyroid gland: Secondary | ICD-10-CM | POA: Diagnosis not present

## 2021-09-10 DIAGNOSIS — Z923 Personal history of irradiation: Secondary | ICD-10-CM | POA: Diagnosis not present

## 2021-09-10 DIAGNOSIS — Z9221 Personal history of antineoplastic chemotherapy: Secondary | ICD-10-CM | POA: Insufficient documentation

## 2021-09-10 DIAGNOSIS — C539 Malignant neoplasm of cervix uteri, unspecified: Secondary | ICD-10-CM

## 2021-09-10 DIAGNOSIS — I5032 Chronic diastolic (congestive) heart failure: Secondary | ICD-10-CM | POA: Insufficient documentation

## 2021-09-10 DIAGNOSIS — I10 Essential (primary) hypertension: Secondary | ICD-10-CM | POA: Insufficient documentation

## 2021-09-10 DIAGNOSIS — N939 Abnormal uterine and vaginal bleeding, unspecified: Secondary | ICD-10-CM | POA: Diagnosis not present

## 2021-09-10 DIAGNOSIS — I110 Hypertensive heart disease with heart failure: Secondary | ICD-10-CM | POA: Insufficient documentation

## 2021-09-10 DIAGNOSIS — Z95828 Presence of other vascular implants and grafts: Secondary | ICD-10-CM

## 2021-09-10 DIAGNOSIS — Z8541 Personal history of malignant neoplasm of cervix uteri: Secondary | ICD-10-CM | POA: Insufficient documentation

## 2021-09-10 DIAGNOSIS — F1490 Cocaine use, unspecified, uncomplicated: Secondary | ICD-10-CM | POA: Insufficient documentation

## 2021-09-10 DIAGNOSIS — N898 Other specified noninflammatory disorders of vagina: Secondary | ICD-10-CM

## 2021-09-10 DIAGNOSIS — E119 Type 2 diabetes mellitus without complications: Secondary | ICD-10-CM | POA: Insufficient documentation

## 2021-09-10 DIAGNOSIS — R918 Other nonspecific abnormal finding of lung field: Secondary | ICD-10-CM | POA: Insufficient documentation

## 2021-09-10 MED ORDER — SODIUM CHLORIDE 0.9% FLUSH
10.0000 mL | Freq: Once | INTRAVENOUS | Status: AC
  Administered 2021-09-10: 10 mL via INTRAVENOUS
  Filled 2021-09-10: qty 10

## 2021-09-10 MED ORDER — HEPARIN SOD (PORK) LOCK FLUSH 100 UNIT/ML IV SOLN
500.0000 [IU] | Freq: Once | INTRAVENOUS | Status: AC
  Administered 2021-09-10: 500 [IU] via INTRAVENOUS
  Filled 2021-09-10: qty 5

## 2021-09-10 NOTE — Progress Notes (Signed)
Symptom Management [place] at [place]. [facility] [address], [place], [place] [address] [phone] (phone) [phone] (fax)  Patient Care Team: Center, [facility] as PCP - General (General Practice) Clent Jacks, RN as Oncology Nurse Navigator Earlie Server, MD as Consulting Physician (Oncology) Earlie Server, MD as Consulting Physician (Hematology and Oncology)   Name of the patient: Rebecca Parker  [id number]  [birth year]   Date of visit: 09/10/21  Diagnosis- Cervical Cancer  Chief complaint/ Reason for visit- vaginal discharge, odor & bleeding  Heme/Onc history:  Oncology History  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[place])  12/23/2020 Initial Diagnosis   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[place])    12/23/2020 Cancer Staging   Staging form: Cervix Uteri, AJCC Version 9 - Clinical: Stage IIIC1 (cT2b, cN1, cM0) - Signed by Earlie Server, MD on 12/29/2020 Stage prefix: Initial diagnosis    01/03/2021 - 01/03/2021 Chemotherapy          01/09/2021 - 01/09/2021 Chemotherapy          01/10/2021 - 01/10/2021 Chemotherapy          01/14/2021 - 01/17/2021 Chemotherapy          01/16/2021 - 01/17/2021 Chemotherapy          01/17/2021 -  Chemotherapy   Patient is on Treatment Plan :  Cisplatin q7d + XRT      03/04/2021 - 03/04/2021 Chemotherapy            Interval history-patient is 71 year old female with above history of cervical cancer status post cisplatin and radiation followed by vaginal brachytherapy completed in January 2023 who presents to clinic for complaints of vaginal discharge, odor and bleeding.  She had similar symptoms a month ago and was treated for bacterial vaginosis with oral flagyl. Last week she was restarted on oral flagyl followed by maintenance intravaginal flagyl. Says her symptoms have improved. She is concerned about chronic symptoms or recurrent  cancer. She has not spoken to [facility] about undergoing surgery for thyroid cancer.   ECOG FS:1 - Symptomatic but completely ambulatory  Review of systems- Review of Systems  Constitutional:  Negative for chills, fever, malaise/fatigue and weight loss.  HENT:  Negative for hearing loss, nosebleeds, sore throat and tinnitus.   Eyes:  Negative for blurred vision and double vision.  Respiratory:  Negative for cough, hemoptysis, shortness of breath and wheezing.   Cardiovascular:  Negative for chest pain, palpitations and leg swelling.  Gastrointestinal:  Negative for abdominal pain, blood in stool, constipation, diarrhea, melena, nausea and vomiting.  Genitourinary:  Negative for dysuria and urgency.  Musculoskeletal:  Negative for back pain, falls, joint pain and myalgias.  Skin:  Negative for itching and rash.  Neurological:  Negative for dizziness, tingling, sensory change, loss of consciousness, weakness and headaches.  Endo/Heme/Allergies:  Negative for environmental allergies. Does not bruise/bleed easily.  Psychiatric/Behavioral:  Positive for substance abuse. Negative for depression. The patient is not nervous/anxious and does not have insomnia.     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, cN1, cM0); Tx'd with Cisplatin +  EBRT   Chest pain, unspecified    CHF (congestive heart failure) ([place])    Cocaine use    Diabetes mellitus without complication (HCC)    Hip pain    HLD (hyperlipidemia)    Hyperplastic colon polyp    Hypertension    Multinodular goiter    Thyroid mass    a.) 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)   Tubular adenoma of colon     Past  Surgical History:  Procedure Laterality Date   ABDOMINAL HYSTERECTOMY     COLONOSCOPY WITH PROPOFOL N/A 11/24/2015   Procedure: COLONOSCOPY WITH PROPOFOL;  Surgeon: Lollie Sails, MD;  Location: [facility] ENDOSCOPY;  Service: Endoscopy;  Laterality: N/A;   COLONOSCOPY WITH PROPOFOL N/A 03/18/2019   Procedure: COLONOSCOPY WITH PROPOFOL;  Surgeon: Virgel Manifold, MD;  Location: [facility] ENDOSCOPY;  Service: Endoscopy;  Laterality: N/A;   DILATION AND CURETTAGE OF UTERUS N/A 12/01/2020   Procedure: DILATATION AND CURETTAGE with cervical biopsies;  Surgeon: Schermerhorn, Gwen Her, MD;  Location: [facility] ORS;  Service: Gynecology;  Laterality: N/A;   ESOPHAGOGASTRODUODENOSCOPY (EGD) WITH PROPOFOL N/A 03/18/2019   Procedure: ESOPHAGOGASTRODUODENOSCOPY (EGD) WITH PROPOFOL;  Surgeon: Virgel Manifold, MD;  Location: [facility] ENDOSCOPY;  Service: Endoscopy;  Laterality: N/A;   PORTA CATH INSERTION N/A 01/16/2021   Procedure: PORTA CATH INSERTION;  Surgeon: Katha Cabal, MD;  Location: [facility] CV LAB;  Service: Cardiovascular;  Laterality: N/A;   right ankle orif      Social History   Socioeconomic History   Marital status: Legally Separated    Spouse name: Not on file   Number of children: Not on file   Years of education: Not on file   Highest education level: Not on file  Occupational History   Not on file  Tobacco Use   Smoking status: Every Day    Packs/day: 0.10    Types: Cigarettes   Smokeless tobacco: Never  Substance and Sexual Activity   Alcohol use: Not Currently   Drug use: Yes    Types: Cocaine    Comment: last year   Sexual activity: Not Currently  Other Topics Concern   Not on file  Social History Narrative   Not on file   Social Determinants of Health   Financial Resource Strain: Not on file  Food Insecurity: Not on file  Transportation Needs: Not on file  Physical Activity: Not on file  Stress: Not on file  Social Connections: Not on file  Intimate  Partner Violence: Not on file    Family History  Problem Relation Age of Onset   Breast cancer Maternal Aunt    Breast cancer Paternal Aunt      Current Outpatient Medications:    aspirin EC 81 MG tablet, Take 81 mg by mouth daily., Disp: , Rfl:    Calcium Carbonate-Vitamin D 500-125 MG-UNIT TABS, Take 1 tablet by mouth daily., Disp: , Rfl:    docusate sodium (COLACE) 100 MG capsule, Take 100 mg by mouth daily., Disp: , Rfl:    Ferrous Sulfate (IRON) 325 (65 Fe) MG TABS, Take 1 tablet by mouth 2 (two) times daily., Disp: , Rfl:    furosemide (LASIX) 20 MG tablet, Take 20 mg by mouth 2 (two) times daily., Disp: , Rfl:    gabapentin (NEURONTIN) 800 MG tablet, Take 800 mg by mouth 2 (two) times daily., Disp: , Rfl:    hydrOXYzine (ATARAX/VISTARIL) 25 MG tablet, Take 25 mg by mouth daily.  Take '25mg'$ . By mouth every morning, Disp: , Rfl:    lidocaine-prilocaine (EMLA) cream, Apply 1 application topically as needed., Disp: , Rfl:    losartan (COZAAR) 50 MG tablet, Take 50 mg by mouth 2 (two) times daily., Disp: , Rfl:    metFORMIN (GLUCOPHAGE) 500 MG tablet, Take 500 mg by mouth 2 (two) times daily with a meal., Disp: , Rfl:    metroNIDAZOLE (FLAGYL) 500 MG tablet, Take 1 tablet (500 mg total) by mouth 2 (two) times daily for 7 days., Disp: 14 tablet, Rfl: 0   metroNIDAZOLE (METROGEL) 0.75 % vaginal gel, Place 5 mg (1 applicator) in vagina at night every 3 days to prevent recurrent vaginal infection, Disp: 70 g, Rfl: 3   mirtazapine (REMERON) 15 MG tablet, Take 15 mg by mouth at bedtime., Disp: , Rfl:    simvastatin (ZOCOR) 20 MG tablet, Take 20 mg by mouth daily., Disp: , Rfl:    ondansetron (ZOFRAN) 4 MG tablet, Take 1 tablet (4 mg total) by mouth every 6 (six) hours as needed for nausea. (Patient not taking: Reported on 02/01/2021), Disp: 20 tablet, Rfl: 0   oxyCODONE (OXY IR/ROXICODONE) 5 MG immediate release tablet, Take 1 tablet (5 mg total) by mouth every 8 (eight) hours as needed for  moderate pain. (Patient not taking: Reported on 05/17/2021), Disp: 15 tablet, Rfl: 0  Physical exam:  Vitals:   09/10/21 1341  BP: (!) 146/83  Pulse: 92  Resp: 20  Temp: 98.7 F (37.1 C)  SpO2: 100%  Weight: 123 lb 4.8 oz (55.9 kg)   Physical Exam Constitutional:      Appearance: She is not ill-appearing.  Pulmonary:     Effort: Pulmonary effort is normal. No respiratory distress.  Abdominal:     General: There is no distension.     Tenderness: There is no abdominal tenderness.  Neurological:     Mental Status: She is alert and oriented to person, place, and time.  Psychiatric:        Mood and Affect: Mood normal.        Behavior: Behavior normal.  Pelvic: exam chaperoned by CMA. Vulva- normal appearing. No masses, tenderness or lesions. Vagina: yellow brown discharge present in vagina. Significantly thinner and less abundant than previous exam. Cervix and upper vagina has white appearance. No necrosis, lesions, or bleeding. Pap obtained. Bimanual & RV: deferred.      Latest Ref Rng & Units 05/17/2021   12:42 PM  CMP  Glucose 70 - 99 mg/dL 93    BUN 8 - 23 mg/dL 13    Creatinine 0.44 - 1.00 mg/dL 1.12    Sodium 135 - 145 mmol/L 138    Potassium 3.5 - 5.1 mmol/L 3.7    Chloride 98 - 111 mmol/L 106    CO2 22 - 32 mmol/L 25    Calcium 8.9 - 10.3 mg/dL 8.5    Total Protein 6.5 - 8.1 g/dL 6.9    Total Bilirubin 0.3 - 1.2 mg/dL 0.2    Alkaline Phos 38 - 126 U/L 59    AST 15 - 41 U/L 19    ALT 0 - 44 U/L 10        Latest Ref Rng & Units 05/17/2021   12:42 PM  CBC  WBC 4.0 - 10.5 K/uL 7.5    Hemoglobin 12.0 - 15.0 g/dL 10.6    Hematocrit 36.0 - 46.0 % 31.9    Platelets 150 - 400 K/uL 314  No images are attached to the encounter.  No results found.  Assessment and plan- Patient is a 71 y.o. female with history of cervical cancer s/p chemo and radiation. Pap obtained today and will get CT Chest abdomen pelivs to evaluate for recurrent disease. 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 Recommend continuing intravaginal metronidazole maintenance to control secondary bacterial infection component. Would hold off on hydrogen peroxide douches at this point as she appears to be having good control of symptoms with metro-gel. If pap and ct are negative, may consider hyperbaric treatments but will defer to Dr. Fransisca Connors.   I again reviewed need for surgery at [facility] for thyroid cancer. At patient's request, I spoke with her and her daughter by phone regarding recommendation. Recommended she contact ENT office to assist with [facility] referral.   Disposition: Pap CT C/A/P Follow up with Dr. Fransisca Connors for results and pelvic exam- la   Visit Diagnosis 1. 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 Vaginal discharge    Patient expressed understanding and was in agreement with this plan. She also understands that She can call clinic at any time with any questions, concerns, or complaints.   Thank you for allowing me to participate in the care of this very pleasant patient.   [name], DNP, AGNP-C [place] at [place]

## 2021-09-17 ENCOUNTER — Ambulatory Visit
Admission: RE | Admit: 2021-09-17 | Discharge: 2021-09-17 | Disposition: A | Discharge status: Home / Self Care | Payer: Medicare Other | Source: Ambulatory Visit | Attending: Nurse Practitioner | Admitting: Nurse Practitioner

## 2021-09-17 DIAGNOSIS — C539 Malignant neoplasm of cervix uteri, unspecified: Secondary | ICD-10-CM | POA: Insufficient documentation

## 2021-09-17 DIAGNOSIS — N898 Other specified noninflammatory disorders of vagina: Secondary | ICD-10-CM | POA: Diagnosis present

## 2021-09-17 LAB — POCT I-STAT CREATININE: Creatinine, Ser: 1 mg/dL (ref 0.44–1.00)

## 2021-09-17 LAB — IGP, APTIMA HPV: HPV Aptima: POSITIVE — AB

## 2021-09-17 MED ORDER — HEPARIN SOD (PORK) LOCK FLUSH 100 UNIT/ML IV SOLN
500.0000 [IU] | Freq: Once | INTRAVENOUS | Status: AC
  Administered 2021-09-17: 500 [IU] via INTRAVENOUS

## 2021-09-17 MED ORDER — IOHEXOL 300 MG/ML  SOLN
75.0000 mL | Freq: Once | INTRAMUSCULAR | Status: AC | PRN
  Administered 2021-09-17: 75 mL via INTRAVENOUS

## 2021-09-19 ENCOUNTER — Ambulatory Visit: Payer: Medicare Other

## 2021-09-19 ENCOUNTER — Inpatient Hospital Stay (HOSPITAL_BASED_OUTPATIENT_CLINIC_OR_DEPARTMENT_OTHER): Payer: Medicare Other | Admitting: Obstetrics and Gynecology

## 2021-09-19 ENCOUNTER — Encounter: Payer: Self-pay | Admitting: Obstetrics and Gynecology

## 2021-09-19 ENCOUNTER — Ambulatory Visit
Admission: RE | Admit: 2021-09-19 | Discharge: 2021-09-19 | Disposition: A | Discharge status: Home / Self Care | Payer: Medicare Other | Source: Ambulatory Visit | Attending: Radiation Oncology | Admitting: Radiation Oncology

## 2021-09-19 VITALS — BP 156/98 | HR 89 | Temp 96.6°F | Resp 20 | Wt 129.0 lb

## 2021-09-19 DIAGNOSIS — R918 Other nonspecific abnormal finding of lung field: Secondary | ICD-10-CM

## 2021-09-19 DIAGNOSIS — Z8541 Personal history of malignant neoplasm of cervix uteri: Secondary | ICD-10-CM

## 2021-09-19 DIAGNOSIS — C538 Malignant neoplasm of overlapping sites of cervix uteri: Secondary | ICD-10-CM

## 2021-09-19 DIAGNOSIS — C539 Malignant neoplasm of cervix uteri, unspecified: Secondary | ICD-10-CM

## 2021-09-19 NOTE — Progress Notes (Signed)
[facility] has been unsuccessful at contacting Ms. Storlie for her thyroid cancer referral that was sent by Dr. Pryor Ochoa. Letter was mailed to her on August 24, 2021. She states she never received a letter. She states she has been waiting on them to call and her home number has not been working. Letter from [employer]. This letter provides the phone number she is to call to make the appointment. Copy of letter also printed for her son, Richardson Landry, and daughter, Baker Janus, per her request. Reiterated the importance of her calling to arrange this appointment. She verbalized understanding.

## 2021-09-19 NOTE — Progress Notes (Signed)
Gynecologic Oncology Consult Visit   Referring Provider: Boykin Nearing, MD   Chief Concern: cervical cancer  Subjective:  Rebecca Parker is a 71 y.o. G3P3 female who is seen in consultation from Dr. Ouida Sills for cervical cancer now s/p chemo and radiation completed 02/08/21.   She presents today for follow up after EBRT. She was seen in 3/23 and had no complaints, but some mucosal erythema and exudate noted.  Seen in 4/23 with copious foul smelling vaginal discharge and saw [name] NP and prescribed Metrogel.  Discharge and odor improved. Returns today for follow up. Occasional episodes of vaginal bleeding.    She saw Dr. Delight Ovens based on findings of previous biopsy (below) and pet (above). She had total thyroidectomy scheduled with Dr. Pryor Ochoa on 07/24/21, but did not follow through.   She had thyroid biopsy on 03/05/21.  A. THYROID GLAND, RIGHT MIDDLE LOBE; ULTRASOUND-GUIDED FINE-NEEDLE ASPIRATION:  - SUSPICIOUS FOR FOLLICULAR NEOPLASM ([place]ETHES[place] CATEGORY IV).   She was seen by ENT on 02/13/2021 and there was no evidence of tonsillary or ENT cancer on exam. HIV test negative 11/2020.  Gynecologic Oncology History:  12/01/2020-8/9/022 She was admitted from the ED for 1 day h/o right lower abdominal / pelvic pain . CT scan revealed a right ovarian cyst 5 cm and air in endometrial cavity. Bedside exam revealed a firm cervical rim and purulent d/c. WBC 23K concern for pyometra.    12/01/2020 Fx D+C  and cervical bx. Uterus sounded to 9 cm.  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   Initally she was thought of urosepsis however neg urine culture. In retrospect, urine was probably contaminated with purulent cervical discharge. She was treated with gent and Cleocin switched to oral Flagyl and is currently on Augmentin. Clinically patient's pain improved with  IV/ PO abx . Hospitalist helped managed DM and CHF ( no significant tx) She was also diagnosed with anemia. Hypoalbuminemia, and thrombocytosis. Her tox screen was positive for cocaine she says she only uses occasionally.    On pelvic exam suspect lateral vaginal disease >50% down the vault and disease involving the left fornix. Cervix enlarged >4 cm with grossly obvious tumor and hard to palpation. Uterus - may be enlarged with firm mass on the right aspect versus palpation of the right adnexal mass. Positive parametrial involvement on the right and on the left with disease to or almost to the sidewall on the left. Rectovaginal exam was confirmatory.      8/31 2022, PET scan showed signs of cervical cancer with diffuse involvement of the uterus.  Cystic and solid right ovarian lesion more likely related to diffuse cervical cancer.  Concomitant synchronous cystic ovarian neoplasm could have appearance but feel less likely given the diffuse nature of disease throughout the uterus and cervix.  Small lymph nodes in the pelvis in the left pelvis suspicious for nodal involvement at the common iliac level.  No solid organ distant metastasis.  Treated with external radiation therapy with weekly cisplatin.   She had pet for restaging on 05/09/21 which showed excellent response to treatment. No residual hypermetabolic tumor was identified. Persistent partially cystic right adnexal mass. Slightly smaller and SUV has decreased. Persistent known thyroid neoplasm (see below biopsy).   Then received brachytherapy with Dr Christel Mormon at [facility] in 1/23.    Problem List: Patient Active Problem List   Diagnosis  Date Noted   Port-A-Cath in place 01/17/2021   Abnormal positron emission tomography (PET) scan 01/17/2021   Tobacco use 01/03/2021   Encounter for antineoplastic chemotherapy 01/03/2021   Vitamin B12 deficiency 01/03/2021   B12 deficiency anemia 01/03/2021   Normocytic anemia 12/23/2020   Invasive carcinoma of  cervix ([place]) 12/23/2020   Goals of care, counseling/discussion 12/23/2020   Cocaine abuse ([place]) 12/23/2020   Sepsis ([place]) 12/01/2020   Ovarian mass, right 12/01/2020   Acute lower UTI 12/01/2020   Pelvic pain 12/01/2020   Morbid (severe) obesity due to excess calories ([place]) 11/15/2015   Mixed incontinence 10/24/2015   Venous insufficiency of both lower extremities 11/24/2014   Benign essential hypertension 11/18/2014   Chronic diastolic CHF (congestive heart failure), NYHA class 2 ([place]) 05/17/2014   Mixed hyperlipidemia 05/17/2014   Tobacco dependence 02/10/2014   Thyroid nodule 02/10/2014   Spinal stenosis, lumbar region without neurogenic claudication 03/05/2013   Constipation 01/29/2013   Eosinophil count raised 10/20/2012   Urinary incontinence 10/19/2012   Type 2 diabetes mellitus without complications ([place]) 34/19/3790   Neck pain 06/15/2012   Polyneuropathy 11/15/2011   Palmar fascial fibromatosis 09/13/2011   Edema 05/29/2011   Vitamin D deficiency 07/31/2009   Insomnia 07/27/2009   Past Medical History: Past Medical History:  Diagnosis Date   Anemia    B12 deficiency 01/03/2021   Cervical cancer ([place])    a.)  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, cN1, cM0); Tx'd with Cisplatin + EBRT   Chest pain, unspecified    CHF (congestive heart failure) (HCC)    Cocaine use    Diabetes mellitus without complication (HCC)    Hip pain    HLD (hyperlipidemia)    Hyperplastic colon polyp    Hypertension    Multinodular goiter    Thyroid mass    a.) 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)   Tubular adenoma of colon     Past Surgical History: Past Surgical History:  Procedure Laterality Date   ABDOMINAL HYSTERECTOMY     COLONOSCOPY WITH PROPOFOL N/A 11/24/2015   Procedure: COLONOSCOPY WITH PROPOFOL;  Surgeon: Lollie Sails, MD;  Location: [facility] ENDOSCOPY;  Service: Endoscopy;  Laterality: N/A;   COLONOSCOPY WITH  PROPOFOL N/A 03/18/2019   Procedure: COLONOSCOPY WITH PROPOFOL;  Surgeon: Virgel Manifold, MD;  Location: [facility] ENDOSCOPY;  Service: Endoscopy;  Laterality: N/A;   DILATION AND CURETTAGE OF UTERUS N/A 12/01/2020   Procedure: DILATATION AND CURETTAGE with cervical biopsies;  Surgeon: Schermerhorn, Gwen Her, MD;  Location: [facility] ORS;  Service: Gynecology;  Laterality: N/A;   ESOPHAGOGASTRODUODENOSCOPY (EGD) WITH PROPOFOL N/A 03/18/2019   Procedure: ESOPHAGOGASTRODUODENOSCOPY (EGD) WITH PROPOFOL;  Surgeon: Virgel Manifold, MD;  Location: [facility] ENDOSCOPY;  Service: Endoscopy;  Laterality: N/A;   PORTA CATH INSERTION N/A 01/16/2021   Procedure: PORTA CATH INSERTION;  Surgeon: Katha Cabal, MD;  Location: [place] CV LAB;  Service: Cardiovascular;  Laterality: N/A;   right ankle orif      Past Gynecologic History:  As per HPI. She does not recall dates of menarche, LMP. She does have a h/o abnormal Pap, but last Pap not listed.   OB History:  OB History  Gravida Para Term Preterm AB Living  3 3          SAB IAB Ectopic Multiple Live Births               # Outcome Date GA Lbr Len/2nd  Weight Sex Delivery Anes PTL Lv  3 Para           2 Para           1 Para             Family History: Family History  Problem Relation Age of Onset   Breast cancer Maternal Aunt    Breast cancer Paternal Aunt     Social History: Social History   Socioeconomic History   Marital status: Legally Separated    Spouse name: Not on file   Number of children: Not on file   Years of education: Not on file   Highest education level: Not on file  Occupational History   Not on file  Tobacco Use   Smoking status: Every Day    Packs/day: 0.10    Types: Cigarettes   Smokeless tobacco: Never  Substance and Sexual Activity   Alcohol use: Not Currently   Drug use: Yes    Types: Cocaine    Comment: last year   Sexual activity: Not Currently  Other Topics Concern   Not on file  Social  History Narrative   Not on file   Social Determinants of Health   Financial Resource Strain: Not on file  Food Insecurity: Not on file  Transportation Needs: Not on file  Physical Activity: Not on file  Stress: Not on file  Social Connections: Not on file  Intimate Partner Violence: Not on file    Allergies: Allergies  Allergen Reactions   Ace Inhibitors Itching    Rash   Cyclobenzaprine Itching    Rash and 'made me nervous'    Current Medications: Current Outpatient Medications  Medication Sig Dispense Refill   aspirin EC 81 MG tablet Take 81 mg by mouth daily.     Calcium Carbonate-Vitamin D 500-125 MG-UNIT TABS Take 1 tablet by mouth daily.     docusate sodium (COLACE) 100 MG capsule Take 100 mg by mouth daily.     Ferrous Sulfate (IRON) 325 (65 Fe) MG TABS Take 1 tablet by mouth 2 (two) times daily.     furosemide (LASIX) 20 MG tablet Take 20 mg by mouth 2 (two) times daily.     gabapentin (NEURONTIN) 800 MG tablet Take 800 mg by mouth 2 (two) times daily.     hydrOXYzine (ATARAX/VISTARIL) 25 MG tablet Take 25 mg by mouth daily. Take '25mg'$ . By mouth every morning     lidocaine-prilocaine (EMLA) cream Apply 1 application topically as needed.     losartan (COZAAR) 50 MG tablet Take 50 mg by mouth 2 (two) times daily.     metFORMIN (GLUCOPHAGE) 500 MG tablet Take 500 mg by mouth 2 (two) times daily with a meal.     metroNIDAZOLE (METROGEL) 0.75 % vaginal gel Place 5 mg (1 applicator) in vagina at night every 3 days to prevent recurrent vaginal infection 70 g 3   mirtazapine (REMERON) 15 MG tablet Take 15 mg by mouth at bedtime.     ondansetron (ZOFRAN) 4 MG tablet Take 1 tablet (4 mg total) by mouth every 6 (six) hours as needed for nausea. (Patient not taking: Reported on 02/01/2021) 20 tablet 0   oxyCODONE (OXY IR/ROXICODONE) 5 MG immediate release tablet Take 1 tablet (5 mg total) by mouth every 8 (eight) hours as needed for moderate pain. (Patient not taking: Reported on  05/17/2021) 15 tablet 0   simvastatin (ZOCOR) 20 MG tablet Take 20 mg by mouth daily.  No current facility-administered medications for this visit.    Review of Systems General:  no complaints Skin: no complaints Eyes: no complaints HEENT: no complaints Breasts: no complaints Pulmonary: no complaints Cardiac: no complaints Gastrointestinal: no complaints Genitourinary/Sexual: no complaints Ob/Gyn: no complaints Musculoskeletal: no complaints Hematology: no complaints Neurologic/Psych: no complaints  Objective:  Physical Examination:  BP (!) 156/98   Pulse 89   Temp (!) 96.6 F (35.9 C)   Resp 20   Wt 129 lb (58.5 kg)   SpO2 100%   BMI 24.37 kg/m    ECOG Performance Status: 1 - Symptomatic but completely ambulatory   GENERAL: Patient is a frail appearing female in no acute distress HEENT:  PERRL, neck supple with midline trachea. Thyroid without masses.  NODES:  No cervical, supraclavicular, axillary, or inguinal lymphadenopathy palpated.  LUNGS:  Clear to auscultation bilaterally. But decreased breath sounds CHEST: port intact without erythema HEART:  Regular rate and rhythm.  BACK: No CVAT  ABDOMEN:  Soft, nontender, nondistended. No masses/hernias/ascites.   EXTREMITIES:  No peripheral edema.   NEURO:  Nonfocal. Well oriented.  Appropriate affect.  Pelvic chaperoned by CMA;  Vulva: normal appearing vulva with no masses, tenderness or lesions; Vagina: No palpable evidence of disease. Cervix and upper vagina have white appearance, but no mucosal crater, discharge or breakdown.   Uterus may be enlarged with firm mass on the right aspect versus palpation of the right adnexal mass. Parametria smooth bilaterally.  Rectovaginal exam deferred  Lab Review Labs on site today: Lab Results  Component Value Date   WBC 7.5 05/17/2021   HGB 10.6 (L) 05/17/2021   HCT 31.9 (L) 05/17/2021   MCV 106.0 (H) 05/17/2021   PLT 314 05/17/2021     Chemistry      Component Value  Date/Time   NA 138 05/17/2021 1242   NA 141 02/24/2019 1550   NA 141 11/01/2013 1021   K 3.7 05/17/2021 1242   K 4.0 11/01/2013 1021   CL 106 05/17/2021 1242   CL 109 (H) 11/01/2013 1021   CO2 25 05/17/2021 1242   CO2 27 11/01/2013 1021   BUN 13 05/17/2021 1242   BUN 10 02/24/2019 1550   BUN 8 11/01/2013 1021   CREATININE 1.00 09/17/2021 1608   CREATININE 1.13 11/01/2013 1021      Component Value Date/Time   CALCIUM 8.5 (L) 05/17/2021 1242   CALCIUM 8.4 (L) 11/01/2013 1021   ALKPHOS 59 05/17/2021 1242   ALKPHOS 70 11/01/2013 1021   AST 19 05/17/2021 1242   AST 19 11/01/2013 1021   ALT 10 05/17/2021 1242   ALT 22 11/01/2013 1021   BILITOT 0.2 (L) 05/17/2021 1242   BILITOT 0.2 02/24/2019 1550   BILITOT 0.2 11/01/2013 1021     Radiologic Imaging: EXAM: 12/01/2020 CT ABDOMEN AND PELVIS WITH CONTRAST   TECHNIQUE: Multidetector CT imaging of the abdomen and pelvis was performed using the standard protocol following bolus administration of intravenous contrast.   CONTRAST:  53m OMNIPAQUE IOHEXOL 350 MG/ML SOLN   COMPARISON:  11/04/2018   FINDINGS: Lower chest: No acute abnormality.   Hepatobiliary: No solid liver abnormality is seen. No gallstones, gallbladder wall thickening, or biliary dilatation.   Pancreas: Unremarkable. No pancreatic ductal dilatation or surrounding inflammatory changes.   Spleen: Normal in size without significant abnormality.   Adrenals/Urinary Tract: Adrenal glands are unremarkable. Kidneys are normal, without renal calculi, solid lesion, or hydronephrosis. Bladder is unremarkable.   Stomach/Bowel: Stomach is within normal limits. Appendix  appears normal. No evidence of bowel wall thickening, distention, or inflammatory changes. Pancolonic diverticulosis.   Vascular/Lymphatic: Aortic atherosclerosis. No enlarged abdominal or pelvic lymph nodes.   Reproductive: Air within the fundal endometrial cavity (series 6, image 86). There is a  new, thickly septated right ovarian mass measuring 5.1 x 5.1 cm (series 2, image 64).   Other: No abdominal wall hernia or abnormality. No abdominopelvic ascites.   Musculoskeletal: No acute or significant osseous findings.   IMPRESSION: 1. There is a new, thickly septated right ovarian mass measuring 5.1 x 5.1 cm. 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 Recommend initial pelvic ultrasound and likely subsequent MRI to further evaluate, which may be performed on a nonemergent basis. 2. Air within the fundal endometrial cavity. Correlate for recent instrumentation. 3. Pancolonic diverticulosis without evidence of acute diverticulitis.   CXR 12/01/2020 IMPRESSION: Cardiac enlargement without acute cardiopulmonary abnormality.  CT scan 09/17/21 CT CHEST FINDINGS   Cardiovascular: Normal heart size. No pericardial effusions. Normal caliber thoracic aorta. Calcification of the aorta and coronary arteries. Central venous catheter with tip in the low SVC region.   Mediastinum/Nodes: Esophagus is decompressed. No significant lymphadenopathy in the chest. Partially calcified heterogeneous mass in the right lobe of the thyroid gland measuring 2.8 cm diameter and corresponding to known neoplasm. No change since prior PET CT.   Lungs/Pleura: Scattered emphysematous changes in the lungs. Several pulmonary nodules are identified including a right lower lung nodule measuring 5 mm diameter, series 3, image 108, and a left lingular nodule measuring 3 mm diameter, image 71. These nodules were not definitively identified on the prior PET-CT but could have been obscured by motion artifact.   Musculoskeletal: Degenerative changes in the spine. No destructive bone lesions.   CT ABDOMEN PELVIS FINDINGS   Hepatobiliary: No focal liver abnormality is seen. No gallstones, gallbladder wall thickening, or biliary dilatation.   Pancreas: Unremarkable. No pancreatic ductal dilatation  or surrounding inflammatory changes.   Spleen: Normal in size without focal abnormality.   Adrenals/Urinary Tract: Adrenal glands are unremarkable. Kidneys are normal, without renal calculi, focal lesion, or hydronephrosis. Bladder is unremarkable.   Stomach/Bowel: Stomach is within normal limits. Appendix is not identified. No evidence of bowel wall thickening, distention, or inflammatory changes.   Vascular/Lymphatic: Calcification throughout the abdominal aorta. No aneurysm. Retroperitoneal and pelvic lymph nodes are not pathologically enlarged.   Reproductive: Complex cystic change demonstrated within the right ovary, measuring 2.7 x 4.7 cm. This is decreasing in size since previous studies. Uterus and left ovary are otherwise normal.   Other: No free air or free fluid in the abdomen. Abdominal wall musculature appears intact.   Musculoskeletal: Degenerative changes in the spine and hips. No destructive bone lesions. Postoperative changes in the lower lumbar spine.   IMPRESSION: 1. No evidence of metastatic disease in the chest, abdomen, or pelvis. 2. Pulmonary nodules bilaterally, measuring 5 and 3 mm. These were not definitely present on the previous study but could have been obscured by motion artifact. Due to the history of primary cancer, 3 to six-month follow-up CT is suggested for further evaluation. 3. Decreasing size of complex cystic changes in the right ovary. 4. Unchanged appearance of known right thyroid neoplasm.    Assessment:  ALAISA MOFFITT is a 71 y.o. female diagnosed with locally advanced stage IIIC-r  cervical cancer with right ovarian mass which may represent metastatic disease versus more likely benign ovarian cyst/TOA/separate primary (the latter is unlikely) s/p external beam radiotherapy with weekly cisplatin completed  in 10/22.  She had PET for restaging on 05/09/21 which showed excellent response to treatment. No residual hypermetabolic tumor was  identified.  Then had brachytherapy x 5 from 12/22-1/23. Excellent response based on exam.  NED on exam and CT scan shows no evidence of disease. Indeterminate pulmonary lesions. .    Mucosal radiation reaction of upper vagina.  Profuse foul smelling discharge resolved with Metrogel.    Medical co-morbidities complicating care: Body mass index is 24.37 kg/m. Positive cocaine use. Type 2 diabetes mellitus without complication; h/o sepsis; HTN; Chronic diastolic CHF (congestive heart failure), NYHA class 2 (HCC) Plan:   Problem List Items Addressed This Visit       Genitourinary   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[place]) - Primary   Relevant Orders   CT CHEST ABDOMEN PELVIS W CONTRAST   Creatinine, serum   Other Visit Diagnoses     Lung nodules       Relevant Orders   CT CHEST ABDOMEN PELVIS W CONTRAST   Creatinine, serum       She will see [place] back in 6 weeks and Dr Baruch Gouty in 3 months.  Will repeat CT scan in 3 months in view of indeterminate pulmonary nodules.   Told her to reduce the daily ASA to qod in view of vaginal bleeding episodes.  No need for Hydrogen peroxide douches or hyperbaric therapy for vaginal radiation changes.   Thyroid findings - SUSPICIOUS FOR FOLLICULAR NEOPLASM ([place] CATEGORY IV). Thyroidectomy planned for 07/24/21, but she did not follow through. Will try to arrange for surgery at [facility].  The patient's diagnosis, an outline of the further diagnostic and laboratory studies which will be required, the recommendation, and alternatives were discussed.  All questions were answered to the patient's satisfaction.  Mellody Drown, MD

## 2021-09-19 NOTE — Progress Notes (Signed)
Radiation Oncology Follow up Note  Name: Rebecca Parker   Date:   09/19/2021 MRN:  [id number] DOB: November 22, 1950    This 71 y.o. 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  Patient received both external beam whole pelvic radiation as well as intracavitary brachytherapy.  REFERRING PROVIDER: [facility] [place] HPI: Patient is a 71year old female now out 6 months having completed concurrent chemoradiation both external beam as well as intracavitary brachytherapy for.  Locally advanced squamous cell carcinoma.  She is seen today in conjunction with Dr. BFransisca Connors  She has been having some vaginal discharge with the worry of being radiation necrosis and she was seen today for pelvic exam.  She states she occasionally has some bleeding although the vaginal discharge has improved.  She had a recent CT scan which I have reviewed showing no evidence of metastatic disease in chest abdomen or pelvis.  She has some bilateral pulmonary nodules that were not definitively present on previous study but could be obscured.  COMPLICATIONS OF TREATMENT: none  FOLLOW UP COMPLIANCE: keeps appointments   PHYSICAL EXAM:  There were no vitals taken for this visit. On speculum examination there is what appears to be a avascular tissue at the cervical region no evidence of mass or nodularity to suggest persistent disease.  There is does not seem to be radiation necrosis.  Well-developed well-nourished patient in NAD. HEENT reveals PERLA, EOMI, discs not visualized.  Oral cavity is clear. No oral mucosal lesions are identified. Neck is clear without evidence of cervical or supraclavicular adenopathy. Lungs are clear to A&P. Cardiac examination is essentially unremarkable with regular rate and rhythm without murmur rub or thrill. Abdomen is benign with no organomegaly or masses noted. Motor sensory and  DTR levels are equal and symmetric in the upper and lower extremities. Cranial nerves II through XII are grossly intact. Proprioception is intact. No peripheral adenopathy or edema is identified. No motor or sensory levels are noted. Crude visual fields are within normal range.  RADIOLOGY RESULTS: CT scan reviewed compatible with above-stated findings  PLAN: This time patient seems to be improving with no overt evidence of disease by physical examination or CT scan.  I have asked to see her back in 4 to 5 months for follow-up.  She continues close follow-up care with GYN oncology in 6 weeks.  I have asked her to cut down on her daily aspirin use which may help with some of her vaginal bleeding.  Patient knows to contact [place] any concerns.  I would like to take this opportunity to thank you for allowing me to participate in the care of your patient..Noreene Filbert MD

## 2021-09-26 ENCOUNTER — Inpatient Hospital Stay: Payer: Medicare Other

## 2021-10-02 ENCOUNTER — Encounter: Payer: Self-pay | Admitting: Oncology

## 2021-10-13 ENCOUNTER — Other Ambulatory Visit: Payer: Self-pay | Admitting: Nurse Practitioner

## 2021-10-18 ENCOUNTER — Inpatient Hospital Stay: Payer: Medicare Other

## 2021-10-31 ENCOUNTER — Inpatient Hospital Stay: Payer: Medicare Other | Attending: Oncology

## 2021-10-31 ENCOUNTER — Ambulatory Visit: Payer: Medicare Other

## 2021-11-09 ENCOUNTER — Telehealth: Payer: Self-pay

## 2021-11-09 NOTE — Telephone Encounter (Signed)
Ms. Arutyunyan did not show for her 10/31/21 gyn oncology appointment. Message sent to scheduling to reschedule. They have spoken with her daughter who will have her call to reschedule.

## 2021-11-21 ENCOUNTER — Ambulatory Visit: Payer: Medicare Other

## 2021-11-28 ENCOUNTER — Inpatient Hospital Stay (HOSPITAL_BASED_OUTPATIENT_CLINIC_OR_DEPARTMENT_OTHER): Payer: Medicare Other | Admitting: Nurse Practitioner

## 2021-11-28 ENCOUNTER — Inpatient Hospital Stay: Payer: Medicare Other | Attending: Oncology

## 2021-11-28 VITALS — BP 115/83 | HR 83 | Temp 98.7°F | Resp 20 | Wt 133.7 lb

## 2021-11-28 DIAGNOSIS — Z95828 Presence of other vascular implants and grafts: Secondary | ICD-10-CM | POA: Diagnosis not present

## 2021-11-28 DIAGNOSIS — Z452 Encounter for adjustment and management of vascular access device: Secondary | ICD-10-CM | POA: Insufficient documentation

## 2021-11-28 DIAGNOSIS — C539 Malignant neoplasm of cervix uteri, unspecified: Secondary | ICD-10-CM

## 2021-11-28 DIAGNOSIS — R918 Other nonspecific abnormal finding of lung field: Secondary | ICD-10-CM | POA: Diagnosis not present

## 2021-11-28 DIAGNOSIS — D497 Neoplasm of unspecified behavior of endocrine glands and other parts of nervous system: Secondary | ICD-10-CM

## 2021-11-28 MED ORDER — SODIUM CHLORIDE 0.9% FLUSH
10.0000 mL | Freq: Once | INTRAVENOUS | Status: AC
  Administered 2021-11-28: 10 mL via INTRAVENOUS
  Filled 2021-11-28: qty 10

## 2021-11-28 MED ORDER — SODIUM CHLORIDE 0.9% FLUSH
10.0000 mL | Freq: Once | INTRAVENOUS | Status: DC
  Filled 2021-11-28: qty 10

## 2021-11-28 MED ORDER — HEPARIN SOD (PORK) LOCK FLUSH 100 UNIT/ML IV SOLN
500.0000 [IU] | Freq: Once | INTRAVENOUS | Status: AC
  Administered 2021-11-28: 500 [IU] via INTRAVENOUS
  Filled 2021-11-28: qty 5

## 2021-11-28 MED ORDER — HEPARIN SOD (PORK) LOCK FLUSH 100 UNIT/ML IV SOLN
500.0000 [IU] | Freq: Once | INTRAVENOUS | Status: DC
  Filled 2021-11-28: qty 5

## 2021-11-28 NOTE — Progress Notes (Signed)
Gynecologic Oncology Consult Visit   Referring Provider: Boykin Nearing, MD   Chief Concern: cervical cancer  Subjective:  Rebecca Parker is a 71 y.o. G3P3 female who is seen in consultation from Dr. Ouida Sills for cervical cancer now s/p chemo and radiation completed 02/08/21.   She presents today for follow up after EBRT. She was seen in 3/23 and had no complaints, but some mucosal erythema and exudate noted.  Seen in 4/23 with copious foul smelling vaginal discharge and saw [name] NP and prescribed Metrogel.  Discharge and odor improved. Returns today for follow up. Occasional episodes of vaginal bleeding.    She saw Dr. Delight Ovens based on findings of previous biopsy (below) and pet (above). She had total thyroidectomy scheduled with Dr. Pryor Ochoa on 07/24/21, but did not follow through.   She had thyroid biopsy on 03/05/21.  A. THYROID GLAND, RIGHT MIDDLE LOBE; ULTRASOUND-GUIDED FINE-NEEDLE ASPIRATION:  - SUSPICIOUS FOR FOLLICULAR NEOPLASM ([place] CATEGORY IV).   She was seen by ENT on 02/13/2021 and there was no evidence of tonsillary or ENT cancer on exam. HIV test negative 11/2020.  Gynecologic Oncology History:  12/01/2020-8/9/022 She was admitted from the ED for 1 day h/o right lower abdominal / pelvic pain . CT scan revealed a right ovarian cyst 5 cm and air in endometrial cavity. Bedside exam revealed a firm cervical rim and purulent d/c. WBC 23K concern for pyometra.    12/01/2020 Fx D+C  and cervical bx. Uterus sounded to 9 cm.  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   Initally she was thought of urosepsis however neg urine culture. In retrospect, urine was probably contaminated with purulent cervical discharge. She was treated with gent and Cleocin switched to oral Flagyl and is currently on Augmentin. Clinically patient's pain improved with  IV/ PO abx . Hospitalist helped managed DM and CHF ( no significant tx) She was also diagnosed with anemia. Hypoalbuminemia, and thrombocytosis. Her tox screen was positive for cocaine she says she only uses occasionally.    On pelvic exam suspect lateral vaginal disease >50% down the vault and disease involving the left fornix. Cervix enlarged >4 cm with grossly obvious tumor and hard to palpation. Uterus - may be enlarged with firm mass on the right aspect versus palpation of the right adnexal mass. Positive parametrial involvement on the right and on the left with disease to or almost to the sidewall on the left. Rectovaginal exam was confirmatory.      8/31 2022, PET scan showed signs of cervical cancer with diffuse involvement of the uterus.  Cystic and solid right ovarian lesion more likely related to diffuse cervical cancer.  Concomitant synchronous cystic ovarian neoplasm could have appearance but feel less likely given the diffuse nature of disease throughout the uterus and cervix.  Small lymph nodes in the pelvis in the left pelvis suspicious for nodal involvement at the common iliac level.  No solid organ distant metastasis.  Treated with external radiation therapy with weekly cisplatin.   She had pet for restaging on 05/09/21 which showed excellent response to treatment. No residual hypermetabolic tumor was identified. Persistent partially cystic right adnexal mass. Slightly smaller and SUV has decreased. Persistent known thyroid neoplasm (see below biopsy).   Then received brachytherapy with Dr Christel Mormon at [facility] in 1/23.    Problem List: Patient Active Problem List   Diagnosis  Date Noted   Port-A-Cath in place 01/17/2021   Abnormal positron emission tomography (PET) scan 01/17/2021   Tobacco use 01/03/2021   Encounter for antineoplastic chemotherapy 01/03/2021   Vitamin B12 deficiency 01/03/2021   B12 deficiency anemia 01/03/2021   Normocytic anemia 12/23/2020   Invasive carcinoma of  cervix ([place]) 12/23/2020   Goals of care, counseling/discussion 12/23/2020   Cocaine abuse ([place]) 12/23/2020   Sepsis ([place]) 12/01/2020   Ovarian mass, right 12/01/2020   Acute lower UTI 12/01/2020   Pelvic pain 12/01/2020   Morbid (severe) obesity due to excess calories ([place]) 11/15/2015   Mixed incontinence 10/24/2015   Venous insufficiency of both lower extremities 11/24/2014   Benign essential hypertension 11/18/2014   Chronic diastolic CHF (congestive heart failure), NYHA class 2 ([place]) 05/17/2014   Mixed hyperlipidemia 05/17/2014   Tobacco dependence 02/10/2014   Thyroid nodule 02/10/2014   Spinal stenosis, lumbar region without neurogenic claudication 03/05/2013   Constipation 01/29/2013   Eosinophil count raised 10/20/2012   Urinary incontinence 10/19/2012   Type 2 diabetes mellitus without complications ([place]) 34/19/3790   Neck pain 06/15/2012   Polyneuropathy 11/15/2011   Palmar fascial fibromatosis 09/13/2011   Edema 05/29/2011   Vitamin D deficiency 07/31/2009   Insomnia 07/27/2009   Past Medical History: Past Medical History:  Diagnosis Date   Anemia    B12 deficiency 01/03/2021   Cervical cancer ([place])    a.)  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, cN1, cM0); Tx'd with Cisplatin + EBRT   Chest pain, unspecified    CHF (congestive heart failure) (HCC)    Cocaine use    Diabetes mellitus without complication (HCC)    Hip pain    HLD (hyperlipidemia)    Hyperplastic colon polyp    Hypertension    Multinodular goiter    Thyroid mass    a.) 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)   Tubular adenoma of colon     Past Surgical History: Past Surgical History:  Procedure Laterality Date   ABDOMINAL HYSTERECTOMY     COLONOSCOPY WITH PROPOFOL N/A 11/24/2015   Procedure: COLONOSCOPY WITH PROPOFOL;  Surgeon: Lollie Sails, MD;  Location: [facility] ENDOSCOPY;  Service: Endoscopy;  Laterality: N/A;   COLONOSCOPY WITH  PROPOFOL N/A 03/18/2019   Procedure: COLONOSCOPY WITH PROPOFOL;  Surgeon: Virgel Manifold, MD;  Location: [facility] ENDOSCOPY;  Service: Endoscopy;  Laterality: N/A;   DILATION AND CURETTAGE OF UTERUS N/A 12/01/2020   Procedure: DILATATION AND CURETTAGE with cervical biopsies;  Surgeon: Schermerhorn, Gwen Her, MD;  Location: [facility] ORS;  Service: Gynecology;  Laterality: N/A;   ESOPHAGOGASTRODUODENOSCOPY (EGD) WITH PROPOFOL N/A 03/18/2019   Procedure: ESOPHAGOGASTRODUODENOSCOPY (EGD) WITH PROPOFOL;  Surgeon: Virgel Manifold, MD;  Location: [facility] ENDOSCOPY;  Service: Endoscopy;  Laterality: N/A;   PORTA CATH INSERTION N/A 01/16/2021   Procedure: PORTA CATH INSERTION;  Surgeon: Katha Cabal, MD;  Location: [place] CV LAB;  Service: Cardiovascular;  Laterality: N/A;   right ankle orif      Past Gynecologic History:  As per HPI. She does not recall dates of menarche, LMP. She does have a h/o abnormal Pap, but last Pap not listed.   OB History:  OB History  Gravida Para Term Preterm AB Living  3 3          SAB IAB Ectopic Multiple Live Births               # Outcome Date GA Lbr Len/2nd  Weight Sex Delivery Anes PTL Lv  3 Para           2 Para           1 Para             Family History: Family History  Problem Relation Age of Onset   Breast cancer Maternal Aunt    Breast cancer Paternal Aunt     Social History: Social History   Socioeconomic History   Marital status: Legally Separated    Spouse name: Not on file   Number of children: Not on file   Years of education: Not on file   Highest education level: Not on file  Occupational History   Not on file  Tobacco Use   Smoking status: Every Day    Packs/day: 0.10    Types: Cigarettes   Smokeless tobacco: Never  Substance and Sexual Activity   Alcohol use: Not Currently   Drug use: Yes    Types: Cocaine    Comment: last year   Sexual activity: Not Currently  Other Topics Concern   Not on file  Social  History Narrative   Not on file   Social Determinants of Health   Financial Resource Strain: Not on file  Food Insecurity: Not on file  Transportation Needs: Not on file  Physical Activity: Not on file  Stress: Not on file  Social Connections: Not on file  Intimate Partner Violence: Not on file    Allergies: Allergies  Allergen Reactions   Ace Inhibitors Itching    Rash   Cyclobenzaprine Itching    Rash and 'made me nervous'    Current Medications: Current Outpatient Medications  Medication Sig Dispense Refill   Calcium Carbonate-Vitamin D 500-125 MG-UNIT TABS Take 1 tablet by mouth daily.     docusate sodium (COLACE) 100 MG capsule Take 100 mg by mouth daily.     Ferrous Sulfate (IRON) 325 (65 Fe) MG TABS Take 1 tablet by mouth 2 (two) times daily.     furosemide (LASIX) 20 MG tablet Take 20 mg by mouth 2 (two) times daily.     gabapentin (NEURONTIN) 800 MG tablet Take 800 mg by mouth 2 (two) times daily.     lidocaine-prilocaine (EMLA) cream Apply 1 application topically as needed.     losartan (COZAAR) 50 MG tablet Take 50 mg by mouth 2 (two) times daily.     metFORMIN (GLUCOPHAGE) 500 MG tablet Take 500 mg by mouth 2 (two) times daily with a meal.     metroNIDAZOLE (METROGEL) 0.75 % vaginal gel Place 5 mg (1 applicator) in vagina at night every 3 days to prevent recurrent vaginal infection 70 g 3   mirtazapine (REMERON) 15 MG tablet Take 15 mg by mouth at bedtime.     ondansetron (ZOFRAN) 4 MG tablet Take 1 tablet (4 mg total) by mouth every 6 (six) hours as needed for nausea. 20 tablet 0   oxyCODONE (OXY IR/ROXICODONE) 5 MG immediate release tablet Take 1 tablet (5 mg total) by mouth every 8 (eight) hours as needed for moderate pain. 15 tablet 0   simvastatin (ZOCOR) 20 MG tablet Take 20 mg by mouth daily.     aspirin EC 81 MG tablet Take 81 mg by mouth daily. (Patient not taking: Reported on 11/28/2021)     hydrOXYzine (ATARAX/VISTARIL) 25 MG tablet Take 25 mg by mouth  daily. Take '25mg'$ . By mouth every morning (Patient not taking: Reported on 11/28/2021)  No current facility-administered medications for this visit.    Review of Systems General:  no complaints Skin: no complaints Eyes: no complaints HEENT: no complaints Breasts: no complaints Pulmonary: no complaints Cardiac: no complaints Gastrointestinal: no complaints Genitourinary/Sexual: no complaints Ob/Gyn: no complaints Musculoskeletal: no complaints Hematology: no complaints Neurologic/Psych: no complaints  Objective:  Physical Examination:  BP 115/83   Pulse 83   Temp 98.7 F (37.1 C)   Resp 20   Wt 133 lb 11.2 oz (60.6 kg)   SpO2 100%   BMI 25.26 kg/m    ECOG Performance Status: 1 - Symptomatic but completely ambulatory   GENERAL: Patient is a frail appearing female in no acute distress HEENT:  PERRL, neck supple with midline trachea. Thyroid without masses.  NODES:  No cervical, supraclavicular, axillary, or inguinal lymphadenopathy palpated.  LUNGS:  Clear to auscultation bilaterally. But decreased breath sounds CHEST: port intact without erythema HEART:  Regular rate and rhythm.  BACK: No CVAT  ABDOMEN:  Soft, nontender, nondistended. No masses/hernias/ascites.   EXTREMITIES:  No peripheral edema.   NEURO:  Nonfocal. Well oriented.  Appropriate affect.  Pelvic chaperoned by CMA;  Vulva: normal appearing vulva with no masses, tenderness or lesions; Vagina: No palpable evidence of disease. Cervix and upper vagina have white appearance, but no mucosal crater, discharge or breakdown.   Uterus may be enlarged with firm mass on the right aspect versus palpation of the right adnexal mass. Parametria smooth bilaterally.  Rectovaginal exam deferred  Lab Review Labs on site today: Lab Results  Component Value Date   WBC 7.5 05/17/2021   HGB 10.6 (L) 05/17/2021   HCT 31.9 (L) 05/17/2021   MCV 106.0 (H) 05/17/2021   PLT 314 05/17/2021     Chemistry      Component Value  Date/Time   NA 138 05/17/2021 1242   NA 141 02/24/2019 1550   NA 141 11/01/2013 1021   K 3.7 05/17/2021 1242   K 4.0 11/01/2013 1021   CL 106 05/17/2021 1242   CL 109 (H) 11/01/2013 1021   CO2 25 05/17/2021 1242   CO2 27 11/01/2013 1021   BUN 13 05/17/2021 1242   BUN 10 02/24/2019 1550   BUN 8 11/01/2013 1021   CREATININE 1.00 09/17/2021 1608   CREATININE 1.13 11/01/2013 1021      Component Value Date/Time   CALCIUM 8.5 (L) 05/17/2021 1242   CALCIUM 8.4 (L) 11/01/2013 1021   ALKPHOS 59 05/17/2021 1242   ALKPHOS 70 11/01/2013 1021   AST 19 05/17/2021 1242   AST 19 11/01/2013 1021   ALT 10 05/17/2021 1242   ALT 22 11/01/2013 1021   BILITOT 0.2 (L) 05/17/2021 1242   BILITOT 0.2 02/24/2019 1550   BILITOT 0.2 11/01/2013 1021     Radiologic Imaging: EXAM: 12/01/2020 CT ABDOMEN AND PELVIS WITH CONTRAST   TECHNIQUE: Multidetector CT imaging of the abdomen and pelvis was performed using the standard protocol following bolus administration of intravenous contrast.   CONTRAST:  52m OMNIPAQUE IOHEXOL 350 MG/ML SOLN   COMPARISON:  11/04/2018   FINDINGS: Lower chest: No acute abnormality.   Hepatobiliary: No solid liver abnormality is seen. No gallstones, gallbladder wall thickening, or biliary dilatation.   Pancreas: Unremarkable. No pancreatic ductal dilatation or surrounding inflammatory changes.   Spleen: Normal in size without significant abnormality.   Adrenals/Urinary Tract: Adrenal glands are unremarkable. Kidneys are normal, without renal calculi, solid lesion, or hydronephrosis. Bladder is unremarkable.   Stomach/Bowel: Stomach is within normal limits. Appendix  appears normal. No evidence of bowel wall thickening, distention, or inflammatory changes. Pancolonic diverticulosis.   Vascular/Lymphatic: Aortic atherosclerosis. No enlarged abdominal or pelvic lymph nodes.   Reproductive: Air within the fundal endometrial cavity (series 6, image 86). There is a  new, thickly septated right ovarian mass measuring 5.1 x 5.1 cm (series 2, image 64).   Other: No abdominal wall hernia or abnormality. No abdominopelvic ascites.   Musculoskeletal: No acute or significant osseous findings.   IMPRESSION: 1. There is a new, thickly septated right ovarian mass measuring 5.1 x 5.1 cm. 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 Recommend initial pelvic ultrasound and likely subsequent MRI to further evaluate, which may be performed on a nonemergent basis. 2. Air within the fundal endometrial cavity. Correlate for recent instrumentation. 3. Pancolonic diverticulosis without evidence of acute diverticulitis.   CXR 12/01/2020 IMPRESSION: Cardiac enlargement without acute cardiopulmonary abnormality.  CT scan 09/17/21 CT CHEST FINDINGS   Cardiovascular: Normal heart size. No pericardial effusions. Normal caliber thoracic aorta. Calcification of the aorta and coronary arteries. Central venous catheter with tip in the low SVC region.   Mediastinum/Nodes: Esophagus is decompressed. No significant lymphadenopathy in the chest. Partially calcified heterogeneous mass in the right lobe of the thyroid gland measuring 2.8 cm diameter and corresponding to known neoplasm. No change since prior PET CT.   Lungs/Pleura: Scattered emphysematous changes in the lungs. Several pulmonary nodules are identified including a right lower lung nodule measuring 5 mm diameter, series 3, image 108, and a left lingular nodule measuring 3 mm diameter, image 71. These nodules were not definitively identified on the prior PET-CT but could have been obscured by motion artifact.   Musculoskeletal: Degenerative changes in the spine. No destructive bone lesions.   CT ABDOMEN PELVIS FINDINGS   Hepatobiliary: No focal liver abnormality is seen. No gallstones, gallbladder wall thickening, or biliary dilatation.   Pancreas: Unremarkable. No pancreatic ductal dilatation  or surrounding inflammatory changes.   Spleen: Normal in size without focal abnormality.   Adrenals/Urinary Tract: Adrenal glands are unremarkable. Kidneys are normal, without renal calculi, focal lesion, or hydronephrosis. Bladder is unremarkable.   Stomach/Bowel: Stomach is within normal limits. Appendix is not identified. No evidence of bowel wall thickening, distention, or inflammatory changes.   Vascular/Lymphatic: Calcification throughout the abdominal aorta. No aneurysm. Retroperitoneal and pelvic lymph nodes are not pathologically enlarged.   Reproductive: Complex cystic change demonstrated within the right ovary, measuring 2.7 x 4.7 cm. This is decreasing in size since previous studies. Uterus and left ovary are otherwise normal.   Other: No free air or free fluid in the abdomen. Abdominal wall musculature appears intact.   Musculoskeletal: Degenerative changes in the spine and hips. No destructive bone lesions. Postoperative changes in the lower lumbar spine.   IMPRESSION: 1. No evidence of metastatic disease in the chest, abdomen, or pelvis. 2. Pulmonary nodules bilaterally, measuring 5 and 3 mm. These were not definitely present on the previous study but could have been obscured by motion artifact. Due to the history of primary cancer, 3 to six-month follow-up CT is suggested for further evaluation. 3. Decreasing size of complex cystic changes in the right ovary. 4. Unchanged appearance of known right thyroid neoplasm.    Assessment:  ZURY FAZZINO is a 71 y.o. female diagnosed with locally advanced stage IIIC-r  cervical cancer with right ovarian mass which may represent metastatic disease versus more likely benign ovarian cyst/TOA/separate primary (the latter is unlikely) s/p external beam radiotherapy with weekly cisplatin completed  in 10/22. Then had brachytherapy x 5 from 12/22-1/23. She had PET for restaging on 05/09/21 which showed excellent response to treatment.  No residual hypermetabolic tumor was identified.  Excellent response based on exam. NED on exam and CT scan shows no evidence of disease. Indeterminate pulmonary lesions.  Mucosal radiation reaction of upper vagina.  Profuse foul smelling discharge resolved with Metrogel.    Medical co-morbidities complicating care: Body mass index is 25.26 kg/m. Positive cocaine use. Type 2 diabetes mellitus without complication; h/o sepsis; HTN; Chronic diastolic CHF (congestive heart failure), NYHA class 2 (HCC) Plan:   Problem List Items Addressed This Visit   None  She will follow up with Dr.Chrystal as scheduled. We will follow up with CT scan to re-evaluate pulmonary nodules in September d/t scheduled thyroidectomy  Vaginal bleeding has stopped.   Vaginal odor and infection has resolved with use of metrogel. Currently no role for hydrogen peroxide douches or hyperbaric.   She will follow up with [facility] for thyroidectomy d/t high risk for anesthesia. Encouraged drug cessation.   Port flush will be rescheduled as well.   Will plan to have her follow up with gyn onc for results of CT scan. Plan to alternate with Dr. Baruch Gouty. We can see her back in December 2023.   The patient's diagnosis, an outline of the further diagnostic and laboratory studies which will be required, the recommendation, and alternatives were discussed.  All questions were answered to the patient's satisfaction.  Rebecca Au, NP

## 2021-12-13 ENCOUNTER — Inpatient Hospital Stay: Payer: Medicare Other

## 2021-12-18 DIAGNOSIS — C73 Malignant neoplasm of thyroid gland: Secondary | ICD-10-CM | POA: Insufficient documentation

## 2021-12-18 HISTORY — PX: THYROIDECTOMY, COMPLETION: SHX7155

## 2021-12-20 ENCOUNTER — Other Ambulatory Visit: Payer: Medicare Other

## 2021-12-20 ENCOUNTER — Ambulatory Visit: Payer: Medicare Other

## 2021-12-21 ENCOUNTER — Institutional Professional Consult (permissible substitution): Payer: Medicare Other | Admitting: Radiation Oncology

## 2022-01-09 ENCOUNTER — Ambulatory Visit
Admission: RE | Admit: 2022-01-09 | Discharge: 2022-01-09 | Disposition: A | Discharge status: Home / Self Care | Payer: Medicare Other | Source: Ambulatory Visit | Attending: Obstetrics and Gynecology | Admitting: Obstetrics and Gynecology

## 2022-01-09 ENCOUNTER — Inpatient Hospital Stay: Payer: Medicare Other | Attending: Oncology

## 2022-01-09 DIAGNOSIS — C539 Malignant neoplasm of cervix uteri, unspecified: Secondary | ICD-10-CM | POA: Insufficient documentation

## 2022-01-09 DIAGNOSIS — R918 Other nonspecific abnormal finding of lung field: Secondary | ICD-10-CM

## 2022-01-09 LAB — CREATININE, SERUM
Creatinine, Ser: 1.52 mg/dL — ABNORMAL HIGH (ref 0.44–1.00)
GFR, Estimated: 36 mL/min — ABNORMAL LOW (ref >60)

## 2022-01-09 MED ORDER — HEPARIN SOD (PORK) LOCK FLUSH 100 UNIT/ML IV SOLN
INTRAVENOUS | Status: AC
  Filled 2022-01-09: qty 5

## 2022-01-09 MED ORDER — IOHEXOL 300 MG/ML  SOLN
80.0000 mL | Freq: Once | INTRAMUSCULAR | Status: AC | PRN
  Administered 2022-01-09: 80 mL via INTRAVENOUS

## 2022-01-09 MED ORDER — HEPARIN SOD (PORK) LOCK FLUSH 100 UNIT/ML IV SOLN
500.0000 [IU] | Freq: Once | INTRAVENOUS | Status: AC
  Administered 2022-01-09: 500 [IU] via INTRAVENOUS
  Filled 2022-01-09: qty 5

## 2022-01-09 MED ORDER — HEPARIN SOD (PORK) LOCK FLUSH 100 UNIT/ML IV SOLN
500.0000 [IU] | Freq: Once | INTRAVENOUS | Status: AC
  Administered 2022-01-09: 500 [IU] via INTRAVENOUS

## 2022-01-14 ENCOUNTER — Telehealth: Payer: Self-pay

## 2022-01-14 NOTE — Telephone Encounter (Signed)
Left voicemail to return call. Need to reschedule gyn oncology appointment on 01/16/22.

## 2022-01-16 ENCOUNTER — Inpatient Hospital Stay: Payer: Medicare Other

## 2022-01-19 ENCOUNTER — Encounter: Payer: Self-pay | Admitting: Oncology

## 2022-01-21 ENCOUNTER — Ambulatory Visit
Admission: RE | Admit: 2022-01-21 | Discharge: 2022-01-21 | Disposition: A | Discharge status: Home / Self Care | Payer: Medicare Other | Source: Ambulatory Visit | Attending: Radiation Oncology | Admitting: Radiation Oncology

## 2022-01-21 VITALS — BP 171/91 | HR 82 | Temp 97.6°F | Resp 16 | Ht 61.5 in | Wt 135.9 lb

## 2022-01-21 DIAGNOSIS — Z923 Personal history of irradiation: Secondary | ICD-10-CM | POA: Diagnosis not present

## 2022-01-21 DIAGNOSIS — K5900 Constipation, unspecified: Secondary | ICD-10-CM | POA: Diagnosis not present

## 2022-01-21 DIAGNOSIS — Z79899 Other long term (current) drug therapy: Secondary | ICD-10-CM | POA: Diagnosis not present

## 2022-01-21 DIAGNOSIS — Z8541 Personal history of malignant neoplasm of cervix uteri: Secondary | ICD-10-CM | POA: Diagnosis present

## 2022-01-21 DIAGNOSIS — Z9221 Personal history of antineoplastic chemotherapy: Secondary | ICD-10-CM | POA: Insufficient documentation

## 2022-01-21 DIAGNOSIS — C538 Malignant neoplasm of overlapping sites of cervix uteri: Secondary | ICD-10-CM

## 2022-01-21 NOTE — Progress Notes (Signed)
Radiation Oncology Follow up Note  Name: Rebecca Parker   Date:   01/21/2022 MRN:  [id number] DOB: May 03, 1950    This 71 y.o. female presents to the clinic today for 46-monthfollow-up status post both external beam radiation therapy as well as brachytherapy for locally squamous cell carcinoma of the cervix who also received concurrent chemotherapy.  REFERRING PROVIDER: [facility] [place] HPI: Patient is a 71year old female now at 9 months having completed both external beam as well as brachytherapy for local advanced squamous cell carcinoma with concurrent chemotherapy.  Seen today in routine follow-up she is doing well.  She states her bowels tend towards more constipation and she does take MiraLAX frequently.  She is having no increased lower urinary tract symptoms..  She had a pelvic exam back in August showing no evidence of disease.  She does have an ovarian mass which we have thought to be related to her cervical cancer that was on repeat PET CT scan back in January showing excellent response with no residual hypermetabolic activity she had a vaginal infection which resolved with MetroGel.  Recent CT scan of chest abdomen pelvis showed 4 mm pulmonary nodules unchanged from prior examination.  She also had stable prominence and low-attenuation of the right ovary.  COMPLICATIONS OF TREATMENT: none  FOLLOW UP COMPLIANCE: keeps appointments   PHYSICAL EXAM:  BP (!) 171/91   Pulse 82   Temp 97.6 F (36.4 C)   Resp 16   Ht 5' 1.5" (1.562 m)   Wt 135 lb 14.4 oz (61.6 kg)   BMI 25.26 kg/m  Well-developed well-nourished patient in NAD. HEENT reveals PERLA, EOMI, discs not visualized.  Oral cavity is clear. No oral mucosal lesions are identified. Neck is clear without evidence of cervical or supraclavicular adenopathy. Lungs are clear to A&P. Cardiac examination is essentially unremarkable with regular rate and rhythm without murmur rub or thrill. Abdomen is benign with no organomegaly  or masses noted. Motor sensory and DTR levels are equal and symmetric in the upper and lower extremities. Cranial nerves II through XII are grossly intact. Proprioception is intact. No peripheral adenopathy or edema is identified. No motor or sensory levels are noted. Crude visual fields are within normal range.  RADIOLOGY RESULTS: CT scans and PET CT scans reviewed compatible with above-stated findings  PLAN: Present time patient is doing well with no evidence of disease 9 months out from treatment for local advanced cervical cancer.  And pleased with her overall progress.  She continues follow-up care with GYN oncology I have asked to see her back in 6 months for follow-up.  Patient is to call with any concerns.  I would like to take this opportunity to thank you for allowing me to participate in the care of your patient..Noreene Filbert MD

## 2022-02-07 ENCOUNTER — Inpatient Hospital Stay: Payer: Medicare Other

## 2022-02-08 NOTE — Telephone Encounter (Signed)
Pt called and I reschedule her appt. She said she wants you to call her back to know why she needs to see GYN

## 2022-02-13 ENCOUNTER — Ambulatory Visit: Payer: Medicare Other

## 2022-02-13 ENCOUNTER — Inpatient Hospital Stay: Payer: Medicare Other | Attending: Oncology

## 2022-02-20 ENCOUNTER — Ambulatory Visit: Payer: Medicare Other | Attending: Otolaryngology

## 2022-02-25 ENCOUNTER — Encounter (INDEPENDENT_AMBULATORY_CARE_PROVIDER_SITE_OTHER): Payer: Self-pay

## 2022-02-25 DIAGNOSIS — E890 Postprocedural hypothyroidism: Secondary | ICD-10-CM | POA: Insufficient documentation

## 2022-02-26 ENCOUNTER — Other Ambulatory Visit: Payer: Self-pay | Admitting: Family Medicine

## 2022-02-26 DIAGNOSIS — Z1231 Encounter for screening mammogram for malignant neoplasm of breast: Secondary | ICD-10-CM

## 2022-03-04 ENCOUNTER — Other Ambulatory Visit: Payer: Self-pay | Admitting: Otolaryngology

## 2022-03-04 DIAGNOSIS — E041 Nontoxic single thyroid nodule: Secondary | ICD-10-CM

## 2022-03-05 ENCOUNTER — Ambulatory Visit
Admission: RE | Admit: 2022-03-05 | Discharge: 2022-03-05 | Disposition: A | Discharge status: Home / Self Care | Payer: Medicare Other | Source: Ambulatory Visit | Attending: Otolaryngology | Admitting: Otolaryngology

## 2022-03-05 DIAGNOSIS — E041 Nontoxic single thyroid nodule: Secondary | ICD-10-CM

## 2022-03-06 ENCOUNTER — Ambulatory Visit: Payer: Medicare Other

## 2022-03-06 ENCOUNTER — Encounter: Payer: Self-pay | Admitting: Obstetrics and Gynecology

## 2022-03-06 ENCOUNTER — Inpatient Hospital Stay: Payer: Medicare Other

## 2022-03-06 ENCOUNTER — Inpatient Hospital Stay: Payer: Medicare Other | Attending: Oncology | Admitting: Obstetrics and Gynecology

## 2022-03-06 VITALS — BP 139/107 | HR 96 | Temp 98.5°F | Wt 130.8 lb

## 2022-03-06 DIAGNOSIS — C539 Malignant neoplasm of cervix uteri, unspecified: Secondary | ICD-10-CM

## 2022-03-06 DIAGNOSIS — Z8541 Personal history of malignant neoplasm of cervix uteri: Secondary | ICD-10-CM | POA: Diagnosis present

## 2022-03-06 DIAGNOSIS — Z95828 Presence of other vascular implants and grafts: Secondary | ICD-10-CM

## 2022-03-06 DIAGNOSIS — Z452 Encounter for adjustment and management of vascular access device: Secondary | ICD-10-CM | POA: Insufficient documentation

## 2022-03-06 DIAGNOSIS — Z923 Personal history of irradiation: Secondary | ICD-10-CM | POA: Diagnosis not present

## 2022-03-06 DIAGNOSIS — Z9221 Personal history of antineoplastic chemotherapy: Secondary | ICD-10-CM | POA: Insufficient documentation

## 2022-03-06 MED ORDER — SODIUM CHLORIDE 0.9% FLUSH
10.0000 mL | Freq: Once | INTRAVENOUS | Status: AC
  Administered 2022-03-06: 10 mL via INTRAVENOUS
  Filled 2022-03-06: qty 10

## 2022-03-06 MED ORDER — HEPARIN SOD (PORK) LOCK FLUSH 100 UNIT/ML IV SOLN
500.0000 [IU] | Freq: Once | INTRAVENOUS | Status: AC
  Administered 2022-03-06: 500 [IU] via INTRAVENOUS
  Filled 2022-03-06: qty 5

## 2022-03-06 NOTE — Progress Notes (Signed)
Gynecologic Oncology Consult Visit   Referring Provider: Boykin Nearing, MD   Chief Concern: cervical cancer  Subjective:  Rebecca Parker is a 71 y.o. G3P3 female who is seen in consultation from Dr. Ouida Sills for cervical cancer now s/p chemo and radiation completed 02/08/21.   She presents today for follow up. She was seen in 3/23 and had no complaints, but some mucosal erythema and exudate noted. Seen in 4/23 with copious foul smelling vaginal discharge and saw [name] NP and prescribed Metrogel.  Discharge and odor improved. Returns today for follow up. Occasional episodes of vaginal bleeding.    No new gyn complaints.    Gynecologic Oncology History:  12/01/2020-8/9/022 She was admitted from the ED for 1 day h/o right lower abdominal / pelvic pain . CT scan revealed a right ovarian cyst 5 cm and air in endometrial cavity. Bedside exam revealed a firm cervical rim and purulent d/c. WBC 23K concern for pyometra.    12/01/2020 Fx D+C  and cervical bx. Uterus sounded to 9 cm.  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   Initally she was thought of urosepsis however neg urine culture. In retrospect, urine was probably contaminated with purulent cervical discharge. She was treated with gent and Cleocin switched to oral Flagyl and is currently on Augmentin. Clinically patient's pain improved with IV/ PO abx . Hospitalist helped managed DM and CHF ( no significant tx) She was also diagnosed with anemia. Hypoalbuminemia, and thrombocytosis. Her tox screen was positive for cocaine she says she only uses occasionally.    On pelvic exam suspect lateral vaginal disease >50% down the vault and disease involving the left fornix. Cervix enlarged >4 cm with grossly obvious tumor and hard to palpation. Uterus - may be enlarged with firm mass on the right aspect versus  palpation of the right adnexal mass. Positive parametrial involvement on the right and on the left with disease to or almost to the sidewall on the left. Rectovaginal exam was confirmatory.      8/31 2022, PET scan showed signs of cervical cancer with diffuse involvement of the uterus.  Cystic and solid right ovarian lesion more likely related to diffuse cervical cancer.  Concomitant synchronous cystic ovarian neoplasm could have appearance but feel less likely given the diffuse nature of disease throughout the uterus and cervix.  Small lymph nodes in the pelvis in the left pelvis suspicious for nodal involvement at the common iliac level.  No solid organ distant metastasis.  Treated with external radiation therapy with weekly cisplatin.   She had pet for restaging on 05/09/21 which showed excellent response to treatment. No residual hypermetabolic tumor was identified. Persistent partially cystic right adnexal mass. Slightly smaller and SUV has decreased. Persistent known thyroid neoplasm (see below biopsy).   Then received brachytherapy with Dr Christel Mormon at [facility] in 1/23.    She had thyroid biopsy on 03/05/21.  A. THYROID GLAND, RIGHT MIDDLE LOBE; ULTRASOUND-GUIDED FINE-NEEDLE ASPIRATION:  - SUSPICIOUS FOR FOLLICULAR NEOPLASM ([place] CATEGORY IV).   She was seen by ENT on 02/13/2021 and there was no evidence of tonsillary or ENT cancer on exam. HIV test negative 11/2020. Thyroidectomy 12/18/21  Problem List: Patient Active Problem List   Diagnosis Date Noted   Port-A-Cath in place 01/17/2021   Abnormal positron emission tomography (PET) scan 01/17/2021   Tobacco use 01/03/2021   Encounter  for antineoplastic chemotherapy 01/03/2021   Vitamin B12 deficiency 01/03/2021   B12 deficiency anemia 01/03/2021   Normocytic anemia 12/23/2020   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[place]) 12/23/2020   Goals of care, counseling/discussion 12/23/2020   Cocaine abuse ([place]) 12/23/2020   Sepsis ([place]) 12/01/2020    Ovarian mass, right 12/01/2020   Acute lower UTI 12/01/2020   Pelvic pain 12/01/2020   Morbid (severe) obesity due to excess calories ([place]) 11/15/2015   Mixed incontinence 10/24/2015   Venous insufficiency of both lower extremities 11/24/2014   Benign essential hypertension 11/18/2014   Chronic diastolic CHF (congestive heart failure), NYHA class 2 ([place]) 05/17/2014   Mixed hyperlipidemia 05/17/2014   Tobacco dependence 02/10/2014   Thyroid nodule 02/10/2014   Spinal stenosis, lumbar region without neurogenic claudication 03/05/2013   Constipation 01/29/2013   Eosinophil count raised 10/20/2012   Urinary incontinence 10/19/2012   Type 2 diabetes mellitus without complications ([place]) 52/77/8242   Neck pain 06/15/2012   Polyneuropathy 11/15/2011   Palmar fascial fibromatosis 09/13/2011   Edema 05/29/2011   Vitamin D deficiency 07/31/2009   Insomnia 07/27/2009   Past Medical History: Past Medical History:  Diagnosis Date   Anemia    B12 deficiency 01/03/2021   Cervical cancer ([place])    a.)  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, cN1, cM0); Tx'd with Cisplatin + EBRT   Chest pain, unspecified    CHF (congestive heart failure) (HCC)    Cocaine use    Diabetes mellitus without complication (HCC)    Hip pain    HLD (hyperlipidemia)    Hyperplastic colon polyp    Hypertension    Multinodular goiter    Thyroid mass    a.) 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)   Tubular adenoma of colon     Past Surgical History: Past Surgical History:  Procedure Laterality Date   ABDOMINAL HYSTERECTOMY     COLONOSCOPY WITH PROPOFOL N/A 11/24/2015   Procedure: COLONOSCOPY WITH PROPOFOL;  Surgeon: Lollie Sails, MD;  Location: [facility] ENDOSCOPY;  Service: Endoscopy;  Laterality: N/A;   COLONOSCOPY WITH PROPOFOL N/A 03/18/2019   Procedure: COLONOSCOPY WITH PROPOFOL;  Surgeon: Virgel Manifold, MD;  Location: [facility] ENDOSCOPY;  Service:  Endoscopy;  Laterality: N/A;   DILATION AND CURETTAGE OF UTERUS N/A 12/01/2020   Procedure: DILATATION AND CURETTAGE with cervical biopsies;  Surgeon: Schermerhorn, Gwen Her, MD;  Location: [facility] ORS;  Service: Gynecology;  Laterality: N/A;   ESOPHAGOGASTRODUODENOSCOPY (EGD) WITH PROPOFOL N/A 03/18/2019   Procedure: ESOPHAGOGASTRODUODENOSCOPY (EGD) WITH PROPOFOL;  Surgeon: Virgel Manifold, MD;  Location: [facility] ENDOSCOPY;  Service: Endoscopy;  Laterality: N/A;   PORTA CATH INSERTION N/A 01/16/2021   Procedure: PORTA CATH INSERTION;  Surgeon: Katha Cabal, MD;  Location: [facility];  Service: Cardiovascular;  Laterality: N/A;   right ankle orif      Past Gynecologic History:  As per HPI. She does not recall dates of menarche, LMP. She does have a h/o abnormal Pap, but last Pap not listed.   OB History:  OB History  Gravida Para Term Preterm AB Living  3 3          SAB IAB Ectopic Multiple Live Births               # Outcome Date GA Lbr Len/2nd Weight Sex Delivery Anes PTL Lv  3 Para           2 Para  1 Para             Family History: Family History  Problem Relation Age of Onset   Breast cancer Maternal Aunt    Breast cancer Paternal Aunt     Social History: Social History   Socioeconomic History   Marital status: Legally Separated    Spouse name: Not on file   Number of children: Not on file   Years of education: Not on file   Highest education level: Not on file  Occupational History   Not on file  Tobacco Use   Smoking status: Every Day    Packs/day: 0.10    Types: Cigarettes   Smokeless tobacco: Never  Substance and Sexual Activity   Alcohol use: Not Currently   Drug use: Yes    Types: Cocaine    Comment: last year   Sexual activity: Not Currently  Other Topics Concern   Not on file  Social History Narrative   Not on file   Social Determinants of Health   Financial Resource Strain: Not on file  Food Insecurity: Not on file   Transportation Needs: Not on file  Physical Activity: Not on file  Stress: Not on file  Social Connections: Not on file  Intimate Partner Violence: Not on file    Allergies: Allergies  Allergen Reactions   Ace Inhibitors Itching    Rash   Cyclobenzaprine Itching    Rash and 'made me nervous'    Current Medications: Current Outpatient Medications  Medication Sig Dispense Refill   aspirin EC 81 MG tablet Take 81 mg by mouth daily.     Calcium Carbonate-Vitamin D 500-125 MG-UNIT TABS Take 1 tablet by mouth daily.     docusate sodium (COLACE) 100 MG capsule Take 100 mg by mouth daily.     Ferrous Sulfate (IRON) 325 (65 Fe) MG TABS Take 1 tablet by mouth 2 (two) times daily.     furosemide (LASIX) 20 MG tablet Take 20 mg by mouth 2 (two) times daily.     gabapentin (NEURONTIN) 800 MG tablet Take 800 mg by mouth 2 (two) times daily.     hydrOXYzine (ATARAX/VISTARIL) 25 MG tablet Take 25 mg by mouth daily. Take '25mg'$ . By mouth every morning     lidocaine-prilocaine (EMLA) cream Apply 1 application topically as needed.     losartan (COZAAR) 50 MG tablet Take 50 mg by mouth 2 (two) times daily.     metFORMIN (GLUCOPHAGE) 500 MG tablet Take 500 mg by mouth 2 (two) times daily with a meal.     metroNIDAZOLE (METROGEL) 0.75 % vaginal gel Place 5 mg (1 applicator) in vagina at night every 3 days to prevent recurrent vaginal infection 70 g 3   mirtazapine (REMERON) 15 MG tablet Take 15 mg by mouth at bedtime.     ondansetron (ZOFRAN) 4 MG tablet Take 1 tablet (4 mg total) by mouth every 6 (six) hours as needed for nausea. 20 tablet 0   oxyCODONE (OXY IR/ROXICODONE) 5 MG immediate release tablet Take 1 tablet (5 mg total) by mouth every 8 (eight) hours as needed for moderate pain. 15 tablet 0   simvastatin (ZOCOR) 20 MG tablet Take 20 mg by mouth daily.     No current facility-administered medications for this visit.    Review of Systems General:  no complaints Skin: no complaints Eyes: no  complaints HEENT: no complaints Breasts: no complaints Pulmonary: no complaints Cardiac: no complaints Gastrointestinal: no complaints Genitourinary/Sexual: no complaints Ob/Gyn:  no complaints Musculoskeletal: no complaints Hematology: no complaints Neurologic/Psych: no complaints  Objective:  Physical Examination:  BP (!) 139/107 (Patient Position: Sitting) Comment: pt very upset  Pulse 96   Temp 98.5 F (36.9 C) (Oral)   Wt 130 lb 12.8 oz (59.3 kg)   SpO2 100%   BMI 24.31 kg/m    ECOG Performance Status: 1 - Symptomatic but completely ambulatory   GENERAL: Patient is a frail appearing female in no acute distress HEENT:  PERRL, neck supple with midline trachea. Thyroid without masses.  NODES:  No cervical, supraclavicular, axillary, or inguinal lymphadenopathy palpated.  LUNGS:  Clear to auscultation bilaterally. But decreased breath sounds CHEST: port intact without erythema HEART:  Regular rate and rhythm.  BACK: No CVAT  ABDOMEN:  Soft, nontender, nondistended. No masses/hernias/ascites.   EXTREMITIES:  No peripheral edema.   NEURO:  Nonfocal. Well oriented.  Appropriate affect.  Pelvic chaperoned by CMA;  Vulva: normal appearing vulva with no masses, tenderness or lesions; Vagina: No palpable evidence of disease. Cervix and upper vagina have white appearance, but no mucosal crater, discharge or breakdown.   Uterus may be enlarged with firm mass on the right aspect versus palpation of the right adnexal mass. Parametria smooth bilaterally.  Rectovaginal exam deferred  Lab Review Labs on site today: Lab Results  Component Value Date   WBC 7.5 05/17/2021   HGB 10.6 (L) 05/17/2021   HCT 31.9 (L) 05/17/2021   MCV 106.0 (H) 05/17/2021   PLT 314 05/17/2021     Chemistry      Component Value Date/Time   NA 138 05/17/2021 1242   NA 141 02/24/2019 1550   NA 141 11/01/2013 1021   K 3.7 05/17/2021 1242   K 4.0 11/01/2013 1021   CL 106 05/17/2021 1242   CL 109 (H)  11/01/2013 1021   CO2 25 05/17/2021 1242   CO2 27 11/01/2013 1021   BUN 13 05/17/2021 1242   BUN 10 02/24/2019 1550   BUN 8 11/01/2013 1021   CREATININE 1.52 (H) 01/09/2022 1050   CREATININE 1.13 11/01/2013 1021      Component Value Date/Time   CALCIUM 8.5 (L) 05/17/2021 1242   CALCIUM 8.4 (L) 11/01/2013 1021   ALKPHOS 59 05/17/2021 1242   ALKPHOS 70 11/01/2013 1021   AST 19 05/17/2021 1242   AST 19 11/01/2013 1021   ALT 10 05/17/2021 1242   ALT 22 11/01/2013 1021   BILITOT 0.2 (L) 05/17/2021 1242   BILITOT 0.2 02/24/2019 1550   BILITOT 0.2 11/01/2013 1021     Radiologic Imaging: EXAM: 12/01/2020 CT ABDOMEN AND PELVIS WITH CONTRAST   TECHNIQUE: Multidetector CT imaging of the abdomen and pelvis was performed using the standard protocol following bolus administration of intravenous contrast.   CONTRAST:  16m OMNIPAQUE IOHEXOL 350 MG/ML SOLN   COMPARISON:  11/04/2018   FINDINGS: Lower chest: No acute abnormality.   Hepatobiliary: No solid liver abnormality is seen. No gallstones, gallbladder wall thickening, or biliary dilatation.   Pancreas: Unremarkable. No pancreatic ductal dilatation or surrounding inflammatory changes.   Spleen: Normal in size without significant abnormality.   Adrenals/Urinary Tract: Adrenal glands are unremarkable. Kidneys are normal, without renal calculi, solid lesion, or hydronephrosis. Bladder is unremarkable.   Stomach/Bowel: Stomach is within normal limits. Appendix appears normal. No evidence of bowel wall thickening, distention, or inflammatory changes. Pancolonic diverticulosis.   Vascular/Lymphatic: Aortic atherosclerosis. No enlarged abdominal or pelvic lymph nodes.   Reproductive: Air within the fundal endometrial cavity (series 6,  image 86). There is a new, thickly septated right ovarian mass measuring 5.1 x 5.1 cm (series 2, image 64).   Other: No abdominal wall hernia or abnormality. No abdominopelvic ascites.    Musculoskeletal: No acute or significant osseous findings.   IMPRESSION: 1. There is a new, thickly septated right ovarian mass measuring 5.1 x 5.1 cm. 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 Recommend initial pelvic ultrasound and likely subsequent MRI to further evaluate, which may be performed on a nonemergent basis. 2. Air within the fundal endometrial cavity. Correlate for recent instrumentation. 3. Pancolonic diverticulosis without evidence of acute diverticulitis.   CXR 12/01/2020 IMPRESSION: Cardiac enlargement without acute cardiopulmonary abnormality.  CT scan 09/17/21 CT CHEST FINDINGS   Cardiovascular: Normal heart size. No pericardial effusions. Normal caliber thoracic aorta. Calcification of the aorta and coronary arteries. Central venous catheter with tip in the low SVC region.   Mediastinum/Nodes: Esophagus is decompressed. No significant lymphadenopathy in the chest. Partially calcified heterogeneous mass in the right lobe of the thyroid gland measuring 2.8 cm diameter and corresponding to known neoplasm. No change since prior PET CT.   Lungs/Pleura: Scattered emphysematous changes in the lungs. Several pulmonary nodules are identified including a right lower lung nodule measuring 5 mm diameter, series 3, image 108, and a left lingular nodule measuring 3 mm diameter, image 71. These nodules were not definitively identified on the prior PET-CT but could have been obscured by motion artifact.   Musculoskeletal: Degenerative changes in the spine. No destructive bone lesions.   CT ABDOMEN PELVIS FINDINGS   Hepatobiliary: No focal liver abnormality is seen. No gallstones, gallbladder wall thickening, or biliary dilatation.   Pancreas: Unremarkable. No pancreatic ductal dilatation or surrounding inflammatory changes.   Spleen: Normal in size without focal abnormality.   Adrenals/Urinary Tract: Adrenal glands are unremarkable. Kidneys are normal,  without renal calculi, focal lesion, or hydronephrosis. Bladder is unremarkable.   Stomach/Bowel: Stomach is within normal limits. Appendix is not identified. No evidence of bowel wall thickening, distention, or inflammatory changes.   Vascular/Lymphatic: Calcification throughout the abdominal aorta. No aneurysm. Retroperitoneal and pelvic lymph nodes are not pathologically enlarged.   Reproductive: Complex cystic change demonstrated within the right ovary, measuring 2.7 x 4.7 cm. This is decreasing in size since previous studies. Uterus and left ovary are otherwise normal.   Other: No free air or free fluid in the abdomen. Abdominal wall musculature appears intact.   Musculoskeletal: Degenerative changes in the spine and hips. No destructive bone lesions. Postoperative changes in the lower lumbar spine.   IMPRESSION: 1. No evidence of metastatic disease in the chest, abdomen, or pelvis. 2. Pulmonary nodules bilaterally, measuring 5 and 3 mm. These were not definitely present on the previous study but could have been obscured by motion artifact. Due to the history of primary cancer, 3 to six-month follow-up CT is suggested for further evaluation. 3. Decreasing size of complex cystic changes in the right ovary. 4. Unchanged appearance of known right thyroid neoplasm.  CT scan 01/09/22 FINDINGS: CT CHEST FINDINGS   Cardiovascular: Heart is mildly enlarged. Aorta is normal in size. There are atherosclerotic calcifications of the aorta and coronary arteries. There is no pericardial effusion. Right chest port catheter tip ends in the right atrium.   Mediastinum/Nodes: No enlarged mediastinal, hilar, or axillary lymph nodes. Thyroid gland, trachea, and esophagus demonstrate no significant findings.   Lungs/Pleura: There is a 3 mm ground-glass nodule in the right lower lobe image 4/94 which is unchanged. There is  a stable 3 mm nodule in the left upper lobe image 3/70. There is a  stable 4 mm nodule in the left lower lobe image 4/84. No new pulmonary nodules. No focal lung infiltrate   Musculoskeletal: No chest wall mass or suspicious bone lesions identified.   CT ABDOMEN PELVIS FINDINGS   Hepatobiliary: No focal liver abnormality is seen. No gallstones, gallbladder wall thickening, or biliary dilatation.   Pancreas: Unremarkable. No pancreatic ductal dilatation or surrounding inflammatory changes.   Spleen: Normal in size without focal abnormality.   Adrenals/Urinary Tract: Mild wall thickening and inflammation of the bladder. The kidneys and adrenal glands are within normal limits.   Stomach/Bowel: Stomach is within normal limits. Appendix appears normal. No evidence of bowel wall thickening, distention, or inflammatory changes. There is diffuse colonic diverticulosis.   Vascular/Lymphatic: Aortic atherosclerosis. No enlarged abdominal or pelvic lymph nodes.   Reproductive: Uterus is within normal limits. The right ovary is prominent in size and low-attenuation similar to the prior study. Left ovary is within normal limits.   Other: No abdominal wall hernia or abnormality. No abdominopelvic ascites.   Musculoskeletal: Degenerative changes affect the spine. No focal osseous lesions are seen.   IMPRESSION: 1. Pulmonary nodules measuring up to 4 mm are unchanged from the prior examination. No new pulmonary nodules. No follow-up needed if patient is low-risk (and has no known or suspected primary neoplasm). Non-contrast chest CT can be considered in 12 months if patient is high-risk. This recommendation follows the consensus statement: Guidelines for Management of Incidental Pulmonary Nodules Detected on CT Images: From the Fleischner Society 2017; Radiology 2017; 284:228-243. 2. Mild bladder wall thickening with surrounding inflammation concerning for cystitis. 3. Mild cardiomegaly. 4. Stable prominence and low-attenuation of the right ovary.  Please see pelvic ultrasound from 12/02/2020.    Assessment:  TORII ROYSE is a 71 y.o. female diagnosed with locally advanced stage IIIC-r  cervical cancer with right ovarian mass which may represent metastatic disease versus more likely benign ovarian cyst/TOA/separate primary (the latter is unlikely) s/p external beam radiotherapy with weekly cisplatin completed in 10/22. Then had brachytherapy x 5 from 12/22-1/23. She had PET for restaging on 05/09/21 which showed excellent response to treatment. No residual hypermetabolic tumor was identified.  Excellent response based on exam. NED on exam and CT scan shows no evidence of disease. Indeterminate pulmonary lesions.  Mucosal radiation reaction of upper vagina.  Profuse foul smelling discharge resolved with Metrogel.    NED today.  Vaginal bleeding has stopped.  Recent CT scan reassuring, including small lung nodules.   Medical co-morbidities complicating care: Body mass index is 24.31 kg/m. Positive cocaine use. Type 2 diabetes mellitus without complication; h/o sepsis; HTN; Chronic diastolic CHF (congestive heart failure), NYHA class 2 (HCC) Plan:   Problem List Items Addressed This Visit       Genitourinary   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[place]) - Primary   She will follow up with Dr.Chrystal as scheduled and see [place] 3 months later.   She will follow up with [facility] for thyroid cancer and believe they are planning a PET scan at some point soon.   IV port in place and being flushed.   The patient's diagnosis, an outline of the further diagnostic and laboratory studies which will be required, the recommendation, and alternatives were discussed.  All questions were answered to the patient's satisfaction.  Mellody Drown, MD

## 2022-03-06 NOTE — Progress Notes (Signed)
Survivorship Care Plan visit completed.  Treatment summary reviewed and given to patient.  ASCO answers booklet reviewed and given to patient.  CARE program and Cancer Transitions discussed with patient along with other resources cancer center offers to patients and caregivers.  Patient verbalized understanding.    

## 2022-04-04 ENCOUNTER — Inpatient Hospital Stay: Payer: Medicare Other

## 2022-04-24 ENCOUNTER — Ambulatory Visit: Payer: Medicare Other

## 2022-05-01 ENCOUNTER — Inpatient Hospital Stay: Payer: 59 | Attending: Oncology

## 2022-05-01 DIAGNOSIS — Z452 Encounter for adjustment and management of vascular access device: Secondary | ICD-10-CM | POA: Insufficient documentation

## 2022-05-01 DIAGNOSIS — Z8541 Personal history of malignant neoplasm of cervix uteri: Secondary | ICD-10-CM | POA: Insufficient documentation

## 2022-05-01 DIAGNOSIS — Z95828 Presence of other vascular implants and grafts: Secondary | ICD-10-CM

## 2022-05-01 MED ORDER — HEPARIN SOD (PORK) LOCK FLUSH 100 UNIT/ML IV SOLN
500.0000 [IU] | Freq: Once | INTRAVENOUS | Status: AC
  Administered 2022-05-01: 500 [IU] via INTRAVENOUS
  Filled 2022-05-01: qty 5

## 2022-05-01 MED ORDER — SODIUM CHLORIDE 0.9% FLUSH
10.0000 mL | Freq: Once | INTRAVENOUS | Status: AC
  Administered 2022-05-01: 10 mL via INTRAVENOUS
  Filled 2022-05-01: qty 10

## 2022-05-09 ENCOUNTER — Encounter: Payer: Self-pay | Admitting: Oncology

## 2022-05-13 ENCOUNTER — Ambulatory Visit
Admission: RE | Admit: 2022-05-13 | Discharge: 2022-05-13 | Disposition: A | Discharge status: Home / Self Care | Payer: 59 | Source: Ambulatory Visit | Attending: Family Medicine | Admitting: Family Medicine

## 2022-05-13 DIAGNOSIS — Z1231 Encounter for screening mammogram for malignant neoplasm of breast: Secondary | ICD-10-CM | POA: Diagnosis present

## 2022-05-17 ENCOUNTER — Other Ambulatory Visit: Payer: Self-pay | Admitting: Family Medicine

## 2022-05-17 DIAGNOSIS — R2231 Localized swelling, mass and lump, right upper limb: Secondary | ICD-10-CM

## 2022-05-17 DIAGNOSIS — R928 Other abnormal and inconclusive findings on diagnostic imaging of breast: Secondary | ICD-10-CM

## 2022-05-24 ENCOUNTER — Ambulatory Visit
Admission: RE | Admit: 2022-05-24 | Discharge: 2022-05-24 | Disposition: A | Discharge status: Home / Self Care | Payer: 59 | Source: Ambulatory Visit | Attending: Family Medicine | Admitting: Family Medicine

## 2022-05-24 DIAGNOSIS — R2231 Localized swelling, mass and lump, right upper limb: Secondary | ICD-10-CM | POA: Diagnosis present

## 2022-05-24 DIAGNOSIS — R928 Other abnormal and inconclusive findings on diagnostic imaging of breast: Secondary | ICD-10-CM | POA: Diagnosis present

## 2022-06-06 ENCOUNTER — Ambulatory Visit: Payer: 59 | Attending: Radiation Oncology | Admitting: Radiation Oncology

## 2022-06-26 ENCOUNTER — Inpatient Hospital Stay: Payer: 59 | Attending: Oncology

## 2022-06-26 VITALS — BP 148/88 | HR 87 | Resp 16

## 2022-06-26 DIAGNOSIS — Z452 Encounter for adjustment and management of vascular access device: Secondary | ICD-10-CM | POA: Insufficient documentation

## 2022-06-26 DIAGNOSIS — Z8541 Personal history of malignant neoplasm of cervix uteri: Secondary | ICD-10-CM | POA: Insufficient documentation

## 2022-06-26 DIAGNOSIS — Z95828 Presence of other vascular implants and grafts: Secondary | ICD-10-CM

## 2022-06-26 MED ORDER — SODIUM CHLORIDE 0.9% FLUSH
10.0000 mL | INTRAVENOUS | Status: DC | PRN
  Administered 2022-06-26: 10 mL via INTRAVENOUS
  Filled 2022-06-26: qty 10

## 2022-06-26 MED ORDER — HEPARIN SOD (PORK) LOCK FLUSH 100 UNIT/ML IV SOLN
500.0000 [IU] | Freq: Once | INTRAVENOUS | Status: AC
  Administered 2022-06-26: 500 [IU] via INTRAVENOUS
  Filled 2022-06-26: qty 5

## 2022-06-26 NOTE — Patient Instructions (Signed)

## 2022-07-24 IMAGING — PT NM PET TUM IMG RESTAG (PS) SKULL BASE T - THIGH
1 of 9 series · 1 of 25 positions shown · non-contrast
Comparison: PET-CT 12/27/2020

CLINICAL DATA: Subsequent treatment strategy for cervical cancer.

EXAM:
NUCLEAR MEDICINE PET SKULL BASE TO THIGH
TECHNIQUE: 7.13 mCi F-18 FDG was injected intravenously. Full-ring PET imaging
was performed from the skull base to thigh after the radiotracer. CT
data was obtained and used for attenuation correction and anatomic
localization.
Fasting blood glucose: 92 mg/dl

[Series 3: ct wb 5.0 b30f · axial · 5.0mm · 0.98mm/px · 1 of 290 slices shown]
[im 290/290  brain]
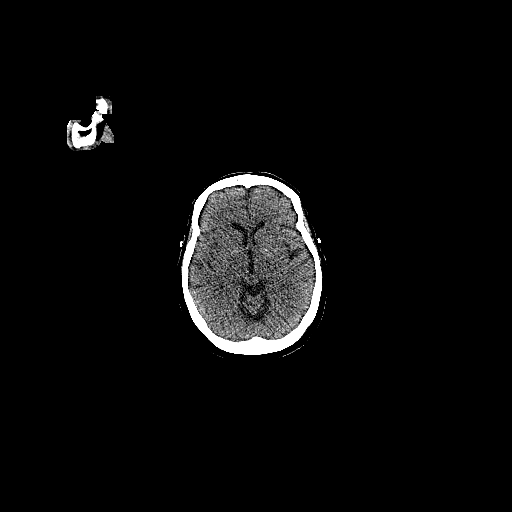

[1 of 25 positions shown; findings below may reference images not displayed]

FINDINGS: Mediastinal blood pool activity: SUV max

Liver activity: SUV max NA

NECK: No neck mass or adenopathy. There is a persistent
hypermetabolic partially calcified right thyroid mass measuring a
maximum 3 cm. The SUV max is 4.97 and was previously 5.89. This was
recently biopsied on 03/05/2021 and was apparently thyroid cancer.

Incidental CT findings: Stable calcifications involving the left
thyroid lobe.

CHEST: No hypermetabolic mediastinal or hilar nodes. No suspicious
pulmonary nodules on the CT scan.

Incidental CT findings: Stable vascular calcifications.

ABDOMEN/PELVIS: No findings for abdominal metastatic disease. No
hepatic or adrenal gland lesions. No upper abdominal hypermetabolic
lymphadenopathy the.

Significant interval improved appearance of the pelvis. The uterus
and cervix demonstrated marked hypermetabolism on the prior study
with SUV max of 16. I do not see any residual hypermetabolism
involving the uterine body or cervix. Partly cystic right-sided
adnexal mass persists measuring approximately 4.7 x 3.0 cm. It
previously measured 6.2 x 4.5 cm. There is some mild residual
hypermetabolism in the more solid area with SUV max of 4.62. This
was previously 6.73.

No residual hypermetabolic pelvic lymph nodes.

Incidental CT findings: Stable vascular calcifications but no
aneurysm. Stable diffuse colonic diverticulosis.

SKELETON: No findings suspicious for osseous metastatic disease.

Incidental CT findings: none
IMPRESSION: 1. Findings suggest an excellent response to treatment involving the
uterus and cervix. No residual hypermetabolic tumor is identified.
2. Persistent partially cystic right adnexal mass. It is slightly
smaller and the SUV max has decreased.
3. Persistent hypermetabolic right thyroid lobe mass consistent with
known thyroid neoplasm. No neck adenopathy.

## 2022-07-29 ENCOUNTER — Ambulatory Visit: Payer: Medicare Other | Admitting: Radiation Oncology

## 2022-08-21 ENCOUNTER — Inpatient Hospital Stay: Payer: 59 | Attending: Oncology

## 2022-08-21 ENCOUNTER — Encounter: Payer: Self-pay | Admitting: Oncology

## 2022-08-21 DIAGNOSIS — Z452 Encounter for adjustment and management of vascular access device: Secondary | ICD-10-CM | POA: Insufficient documentation

## 2022-08-21 DIAGNOSIS — Z8541 Personal history of malignant neoplasm of cervix uteri: Secondary | ICD-10-CM | POA: Insufficient documentation

## 2022-08-21 DIAGNOSIS — Z95828 Presence of other vascular implants and grafts: Secondary | ICD-10-CM

## 2022-08-21 MED ORDER — HEPARIN SOD (PORK) LOCK FLUSH 100 UNIT/ML IV SOLN
500.0000 [IU] | Freq: Once | INTRAVENOUS | Status: DC
  Filled 2022-08-21: qty 5

## 2022-08-21 MED ORDER — SODIUM CHLORIDE 0.9% FLUSH
10.0000 mL | INTRAVENOUS | Status: DC | PRN
  Filled 2022-08-21: qty 10

## 2022-08-21 MED ORDER — HEPARIN SOD (PORK) LOCK FLUSH 100 UNIT/ML IV SOLN
500.0000 [IU] | Freq: Once | INTRAVENOUS | Status: AC
  Administered 2022-08-21: 500 [IU] via INTRAVENOUS
  Filled 2022-08-21: qty 5

## 2022-09-04 ENCOUNTER — Inpatient Hospital Stay: Payer: 59 | Attending: Oncology | Admitting: Obstetrics and Gynecology

## 2022-09-04 ENCOUNTER — Encounter: Payer: Self-pay | Admitting: *Deleted

## 2022-09-04 ENCOUNTER — Encounter: Payer: Self-pay | Admitting: Oncology

## 2022-09-04 VITALS — BP 148/104 | HR 87 | Resp 20 | Wt 132.6 lb

## 2022-09-04 DIAGNOSIS — F17210 Nicotine dependence, cigarettes, uncomplicated: Secondary | ICD-10-CM | POA: Diagnosis not present

## 2022-09-04 DIAGNOSIS — Z8541 Personal history of malignant neoplasm of cervix uteri: Secondary | ICD-10-CM | POA: Diagnosis not present

## 2022-09-04 DIAGNOSIS — Z9221 Personal history of antineoplastic chemotherapy: Secondary | ICD-10-CM | POA: Insufficient documentation

## 2022-09-04 DIAGNOSIS — Z923 Personal history of irradiation: Secondary | ICD-10-CM | POA: Insufficient documentation

## 2022-09-04 DIAGNOSIS — C73 Malignant neoplasm of thyroid gland: Secondary | ICD-10-CM | POA: Insufficient documentation

## 2022-09-04 DIAGNOSIS — F1410 Cocaine abuse, uncomplicated: Secondary | ICD-10-CM | POA: Diagnosis not present

## 2022-09-04 DIAGNOSIS — C539 Malignant neoplasm of cervix uteri, unspecified: Secondary | ICD-10-CM

## 2022-09-04 NOTE — Progress Notes (Signed)
Gynecologic Oncology Consult Visit   Referring Provider: Suzy Bouchard, MD   Chief Concern: cervical cancer  Subjective:  Rebecca Parker is a 72 y.o. G3P3 female who is seen in consultation from Dr. Feliberto Gottron for cervical cancer now s/p chemo and radiation completed 02/08/21.   She presents today for follow up. No new gyn complaints. Seen in 4/23 with copious foul smelling vaginal discharge and saw Consuello Masse NP and prescribed Metrogel.  Discharge and odor improved.   Gynecologic Oncology History:  12/01/2020-8/9/022 She was admitted from the ED for 1 day h/o right lower abdominal / pelvic pain . CT scan revealed a right ovarian cyst 5 cm and air in endometrial cavity. Bedside exam revealed a firm cervical rim and purulent d/c. WBC 23K concern for pyometra.    12/01/2020 Fx D+C  and cervical bx. Uterus sounded to 9 cm.  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   Initally she was thought of urosepsis however neg urine culture. In retrospect, urine was probably contaminated with purulent cervical discharge. She was treated with gent and Cleocin switched to oral Flagyl and is currently on Augmentin. Clinically patient's pain improved with IV/ PO abx . Hospitalist helped managed DM and CHF ( no significant tx) She was also diagnosed with anemia. Hypoalbuminemia, and thrombocytosis. Her tox screen was positive for cocaine she says she only uses occasionally.    On pelvic exam suspect lateral vaginal disease >50% down the vault and disease involving the left fornix. Cervix enlarged >4 cm with grossly obvious tumor and hard to palpation. Uterus - may be enlarged with firm mass on the right aspect versus palpation of the right adnexal mass. Positive parametrial involvement on the right and on the left with disease to or almost to the sidewall on the left. Rectovaginal exam  was confirmatory.      8/31 2022, PET scan showed signs of cervical cancer with diffuse involvement of the uterus.  Cystic and solid right ovarian lesion more likely related to diffuse cervical cancer.  Concomitant synchronous cystic ovarian neoplasm could have appearance but feel less likely given the diffuse nature of disease throughout the uterus and cervix.  Small lymph nodes in the pelvis in the left pelvis suspicious for nodal involvement at the common iliac level.  No solid organ distant metastasis.  Treated with external radiation therapy with weekly cisplatin.   She had pet for restaging on 05/09/21 which showed excellent response to treatment. No residual hypermetabolic tumor was identified. Persistent partially cystic right adnexal mass. Slightly smaller and SUV has decreased. Persistent known thyroid neoplasm (see below biopsy).   Then received brachytherapy with Dr Marinell Blight at [facility] in 1/23.    She had thyroid biopsy on 03/05/21.  A. THYROID GLAND, RIGHT MIDDLE LOBE; ULTRASOUND-GUIDED FINE-NEEDLE ASPIRATION:  - SUSPICIOUS FOR FOLLICULAR NEOPLASM ([place] CATEGORY IV).   She was seen by ENT on 02/13/2021 and there was no evidence of tonsillary or ENT cancer on exam. HIV test negative 11/2020. Thyroidectomy 12/18/21  Problem List: Patient Active Problem List   Diagnosis Date Noted   Port-A-Cath in place 01/17/2021   Abnormal positron emission tomography (PET) scan 01/17/2021   Tobacco use 01/03/2021   Encounter for antineoplastic chemotherapy 01/03/2021   Vitamin B12 deficiency 01/03/2021   B12 deficiency anemia 01/03/2021   Normocytic anemia 12/23/2020   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HCC) 12/23/2020  Goals of care, counseling/discussion 12/23/2020   Cocaine abuse (HCC) 12/23/2020   Sepsis (HCC) 12/01/2020   Ovarian mass, right 12/01/2020   Acute lower UTI 12/01/2020   Pelvic pain 12/01/2020   Morbid (severe) obesity due to excess calories (HCC) 11/15/2015   Mixed incontinence  10/24/2015   Venous insufficiency of both lower extremities 11/24/2014   Benign essential hypertension 11/18/2014   Chronic diastolic CHF (congestive heart failure), NYHA class 2 (HCC) 05/17/2014   Mixed hyperlipidemia 05/17/2014   Tobacco dependence 02/10/2014   Thyroid nodule 02/10/2014   Spinal stenosis, lumbar region without neurogenic claudication 03/05/2013   Constipation 01/29/2013   Eosinophil count raised 10/20/2012   Urinary incontinence 10/19/2012   Type 2 diabetes mellitus without complications (HCC) 07/24/2012   Neck pain 06/15/2012   Polyneuropathy 11/15/2011   Palmar fascial fibromatosis 09/13/2011   Edema 05/29/2011   Vitamin D deficiency 07/31/2009   Insomnia 07/27/2009   Past Medical History: 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, cN1, cM0); Tx'd with Cisplatin + EBRT   Chest pain, unspecified    CHF (congestive heart failure) (HCC)    Cocaine use    Diabetes mellitus without complication (HCC)    Hip pain    HLD (hyperlipidemia)    Hyperplastic colon polyp    Hypertension    Multinodular goiter    Thyroid mass    a.) 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)   Tubular adenoma of colon     Past Surgical History: Past Surgical History:  Procedure Laterality Date   ABDOMINAL HYSTERECTOMY     COLONOSCOPY WITH PROPOFOL N/A 11/24/2015   Procedure: COLONOSCOPY WITH PROPOFOL;  Surgeon: Christena Deem, MD;  Location[facility] ENDOSCOPY;  Service: Endoscopy;  Laterality: N/A;   COLONOSCOPY WITH PROPOFOL N/A 03/18/2019   Procedure: COLONOSCOPY WITH PROPOFOL;  Surgeon: Pasty Spillers, MD;  Location: [facility] ENDOSCOPY;  Service: Endoscopy;  Laterality: N/A;   DILATION AND CURETTAGE OF UTERUS N/A 12/01/2020   Procedure: DILATATION AND CURETTAGE with cervical biopsies;  Surgeon: Schermerhorn, Ihor Austin,  MD;  Location: [facility] ORS;  Service: Gynecology;  Laterality: N/A;   ESOPHAGOGASTRODUODENOSCOPY (EGD) WITH PROPOFOL N/A 03/18/2019   Procedure: ESOPHAGOGASTRODUODENOSCOPY (EGD) WITH PROPOFOL;  Surgeon: Pasty Spillers, MD;  Location: [facility] ENDOSCOPY;  Service: Endoscopy;  Laterality: N/A;   PORTA CATH INSERTION N/A 01/16/2021   Procedure: PORTA CATH INSERTION;  Surgeon: Renford Dills, MD;  Location: [facility] INVASIVE CV LAB;  Service: Cardiovascular;  Laterality: N/A;   right ankle orif      Past Gynecologic History:  As per HPI. She does not recall dates of menarche, LMP. She does have a h/o abnormal Pap, but last Pap not listed.   OB History:  OB History  Gravida Para Term Preterm AB Living  3 3          SAB IAB Ectopic Multiple Live Births               # Outcome Date GA Lbr Len/2nd Weight Sex Delivery Anes PTL Lv  3 Para           2 Para           1 Para             Family History: Family History  Problem Relation Age of Onset  Breast cancer Maternal Aunt    Breast cancer Paternal Aunt     Social History: Social History   Socioeconomic History   Marital status: Legally Separated    Spouse name: Not on file   Number of children: Not on file   Years of education: Not on file   Highest education level: Not on file  Occupational History   Not on file  Tobacco Use   Smoking status: Every Day    Packs/day: .1    Types: Cigarettes   Smokeless tobacco: Never  Substance and Sexual Activity   Alcohol use: Not Currently   Drug use: Yes    Types: Cocaine    Comment: last year   Sexual activity: Not Currently  Other Topics Concern   Not on file  Social History Narrative   Not on file   Social Determinants of Health   Financial Resource Strain: Not on file  Food Insecurity: Not on file  Transportation Needs: Not on file  Physical Activity: Not on file  Stress: Not on file  Social Connections: Not on file  Intimate Partner Violence: Not on file     Allergies: Allergies  Allergen Reactions   Ace Inhibitors Itching    Rash   Cyclobenzaprine Itching    Rash and 'made me nervous'    Current Medications: Current Outpatient Medications  Medication Sig Dispense Refill   aspirin EC 81 MG tablet Take 81 mg by mouth daily.     Calcium Carbonate-Vitamin D 500-125 MG-UNIT TABS Take 1 tablet by mouth daily.     docusate sodium (COLACE) 100 MG capsule Take 100 mg by mouth daily.     Ferrous Sulfate (IRON) 325 (65 Fe) MG TABS Take 1 tablet by mouth 2 (two) times daily.     furosemide (LASIX) 20 MG tablet Take 20 mg by mouth 2 (two) times daily.     gabapentin (NEURONTIN) 800 MG tablet Take 800 mg by mouth 2 (two) times daily.     hydrOXYzine (ATARAX/VISTARIL) 25 MG tablet Take 25 mg by mouth daily. Take 25mg . By mouth every morning     lidocaine-prilocaine (EMLA) cream Apply 1 application topically as needed.     losartan (COZAAR) 50 MG tablet Take 50 mg by mouth 2 (two) times daily.     metFORMIN (GLUCOPHAGE) 500 MG tablet Take 500 mg by mouth 2 (two) times daily with a meal.     metroNIDAZOLE (METROGEL) 0.75 % vaginal gel Place 5 mg (1 applicator) in vagina at night every 3 days to prevent recurrent vaginal infection 70 g 3   mirtazapine (REMERON) 15 MG tablet Take 15 mg by mouth at bedtime.     ondansetron (ZOFRAN) 4 MG tablet Take 1 tablet (4 mg total) by mouth every 6 (six) hours as needed for nausea. 20 tablet 0   oxyCODONE (OXY IR/ROXICODONE) 5 MG immediate release tablet Take 1 tablet (5 mg total) by mouth every 8 (eight) hours as needed for moderate pain. 15 tablet 0   simvastatin (ZOCOR) 20 MG tablet Take 20 mg by mouth daily.     No current facility-administered medications for this visit.    Review of Systems General:  no complaints Skin: no complaints Eyes: no complaints HEENT: no complaints Breasts: no complaints Pulmonary: no complaints Cardiac: no complaints Gastrointestinal: no complaints Genitourinary/Sexual: no  complaints Ob/Gyn: no complaints Musculoskeletal: no complaints Hematology: no complaints Neurologic/Psych: no complaints  Objective:  Physical Examination:  BP (!) 148/104   Pulse 87  Resp 20   Wt 132 lb 9.6 oz (60.1 kg)   SpO2 100%   BMI 24.65 kg/m    ECOG Performance Status: 1 - Symptomatic but completely ambulatory   GENERAL: Patient is a frail appearing female in no acute distress HEENT:  PERRL, neck supple with midline trachea. Thyroid without masses.  NODES:  No cervical, supraclavicular, axillary, or inguinal lymphadenopathy palpated.  LUNGS:  Clear to auscultation bilaterally. But decreased breath sounds CHEST: port intact without erythema HEART:  Regular rate and rhythm.  BACK: No CVAT  ABDOMEN:  Soft, nontender, nondistended. No masses/hernias/ascites.   EXTREMITIES:  No peripheral edema.   NEURO:  Nonfocal. Well oriented.  Appropriate affect.  Pelvic chaperoned by CMA;  Vulva: normal appearing vulva with no masses, tenderness or lesions; Vagina: No palpable evidence of disease. Cervix and upper vagina have white appearance, but no mucosal crater, discharge or breakdown.   Uterus may be enlarged with firm mass on the right aspect versus palpation of the right adnexal mass. Parametria smooth bilaterally.  Rectovaginal exam deferred  Lab Review Labs on site today: Lab Results  Component Value Date   WBC 7.5 05/17/2021   HGB 10.6 (L) 05/17/2021   HCT 31.9 (L) 05/17/2021   MCV 106.0 (H) 05/17/2021   PLT 314 05/17/2021     Chemistry      Component Value Date/Time   NA 138 05/17/2021 1242   NA 141 02/24/2019 1550   NA 141 11/01/2013 1021   K 3.7 05/17/2021 1242   K 4.0 11/01/2013 1021   CL 106 05/17/2021 1242   CL 109 (H) 11/01/2013 1021   CO2 25 05/17/2021 1242   CO2 27 11/01/2013 1021   BUN 13 05/17/2021 1242   BUN 10 02/24/2019 1550   BUN 8 11/01/2013 1021   CREATININE 1.52 (H) 01/09/2022 1050   CREATININE 1.13 11/01/2013 1021      Component Value  Date/Time   CALCIUM 8.5 (L) 05/17/2021 1242   CALCIUM 8.4 (L) 11/01/2013 1021   ALKPHOS 59 05/17/2021 1242   ALKPHOS 70 11/01/2013 1021   AST 19 05/17/2021 1242   AST 19 11/01/2013 1021   ALT 10 05/17/2021 1242   ALT 22 11/01/2013 1021   BILITOT 0.2 (L) 05/17/2021 1242   BILITOT 0.2 02/24/2019 1550   BILITOT 0.2 11/01/2013 1021     Radiologic Imaging: EXAM: 12/01/2020 CT ABDOMEN AND PELVIS WITH CONTRAST   TECHNIQUE: Multidetector CT imaging of the abdomen and pelvis was performed using the standard protocol following bolus administration of intravenous contrast.   CONTRAST:  80mL OMNIPAQUE IOHEXOL 350 MG/ML SOLN   COMPARISON:  11/04/2018   FINDINGS: Lower chest: No acute abnormality.   Hepatobiliary: No solid liver abnormality is seen. No gallstones, gallbladder wall thickening, or biliary dilatation.   Pancreas: Unremarkable. No pancreatic ductal dilatation or surrounding inflammatory changes.   Spleen: Normal in size without significant abnormality.   Adrenals/Urinary Tract: Adrenal glands are unremarkable. Kidneys are normal, without renal calculi, solid lesion, or hydronephrosis. Bladder is unremarkable.   Stomach/Bowel: Stomach is within normal limits. Appendix appears normal. No evidence of bowel wall thickening, distention, or inflammatory changes. Pancolonic diverticulosis.   Vascular/Lymphatic: Aortic atherosclerosis. No enlarged abdominal or pelvic lymph nodes.   Reproductive: Air within the fundal endometrial cavity (series 6, image 86). There is a new, thickly septated right ovarian mass measuring 5.1 x 5.1 cm (series 2, image 64).   Other: No abdominal wall hernia or abnormality. No abdominopelvic ascites.   Musculoskeletal: No  acute or significant osseous findings.   IMPRESSION: 1. There is a new, thickly septated right ovarian mass measuring 5.1 x 5.1 cm. 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 Recommend initial pelvic ultrasound and  likely subsequent MRI to further evaluate, which may be performed on a nonemergent basis. 2. Air within the fundal endometrial cavity. Correlate for recent instrumentation. 3. Pancolonic diverticulosis without evidence of acute diverticulitis.   CXR 12/01/2020 IMPRESSION: Cardiac enlargement without acute cardiopulmonary abnormality.  CT scan 09/17/21 CT CHEST FINDINGS   Cardiovascular: Normal heart size. No pericardial effusions. Normal caliber thoracic aorta. Calcification of the aorta and coronary arteries. Central venous catheter with tip in the low SVC region.   Mediastinum/Nodes: Esophagus is decompressed. No significant lymphadenopathy in the chest. Partially calcified heterogeneous mass in the right lobe of the thyroid gland measuring 2.8 cm diameter and corresponding to known neoplasm. No change since prior PET CT.   Lungs/Pleura: Scattered emphysematous changes in the lungs. Several pulmonary nodules are identified including a right lower lung nodule measuring 5 mm diameter, series 3, image 108, and a left lingular nodule measuring 3 mm diameter, image 71. These nodules were not definitively identified on the prior PET-CT but could have been obscured by motion artifact.   Musculoskeletal: Degenerative changes in the spine. No destructive bone lesions.   CT ABDOMEN PELVIS FINDINGS   Hepatobiliary: No focal liver abnormality is seen. No gallstones, gallbladder wall thickening, or biliary dilatation.   Pancreas: Unremarkable. No pancreatic ductal dilatation or surrounding inflammatory changes.   Spleen: Normal in size without focal abnormality.   Adrenals/Urinary Tract: Adrenal glands are unremarkable. Kidneys are normal, without renal calculi, focal lesion, or hydronephrosis. Bladder is unremarkable.   Stomach/Bowel: Stomach is within normal limits. Appendix is not identified. No evidence of bowel wall thickening, distention, or inflammatory changes.    Vascular/Lymphatic: Calcification throughout the abdominal aorta. No aneurysm. Retroperitoneal and pelvic lymph nodes are not pathologically enlarged.   Reproductive: Complex cystic change demonstrated within the right ovary, measuring 2.7 x 4.7 cm. This is decreasing in size since previous studies. Uterus and left ovary are otherwise normal.   Other: No free air or free fluid in the abdomen. Abdominal wall musculature appears intact.   Musculoskeletal: Degenerative changes in the spine and hips. No destructive bone lesions. Postoperative changes in the lower lumbar spine.   IMPRESSION: 1. No evidence of metastatic disease in the chest, abdomen, or pelvis. 2. Pulmonary nodules bilaterally, measuring 5 and 3 mm. These were not definitely present on the previous study but could have been obscured by motion artifact. Due to the history of primary cancer, 3 to six-month follow-up CT is suggested for further evaluation. 3. Decreasing size of complex cystic changes in the right ovary. 4. Unchanged appearance of known right thyroid neoplasm.  CT scan 01/09/22 FINDINGS: CT CHEST FINDINGS   Cardiovascular: Heart is mildly enlarged. Aorta is normal in size. There are atherosclerotic calcifications of the aorta and coronary arteries. There is no pericardial effusion. Right chest port catheter tip ends in the right atrium.   Mediastinum/Nodes: No enlarged mediastinal, hilar, or axillary lymph nodes. Thyroid gland, trachea, and esophagus demonstrate no significant findings.   Lungs/Pleura: There is a 3 mm ground-glass nodule in the right lower lobe image 4/94 which is unchanged. There is a stable 3 mm nodule in the left upper lobe image 3/70. There is a stable 4 mm nodule in the left lower lobe image 4/84. No new pulmonary nodules. No focal lung infiltrate  Musculoskeletal: No chest wall mass or suspicious bone lesions identified.   CT ABDOMEN PELVIS FINDINGS   Hepatobiliary: No  focal liver abnormality is seen. No gallstones, gallbladder wall thickening, or biliary dilatation.   Pancreas: Unremarkable. No pancreatic ductal dilatation or surrounding inflammatory changes.   Spleen: Normal in size without focal abnormality.   Adrenals/Urinary Tract: Mild wall thickening and inflammation of the bladder. The kidneys and adrenal glands are within normal limits.   Stomach/Bowel: Stomach is within normal limits. Appendix appears normal. No evidence of bowel wall thickening, distention, or inflammatory changes. There is diffuse colonic diverticulosis.   Vascular/Lymphatic: Aortic atherosclerosis. No enlarged abdominal or pelvic lymph nodes.   Reproductive: Uterus is within normal limits. The right ovary is prominent in size and low-attenuation similar to the prior study. Left ovary is within normal limits.   Other: No abdominal wall hernia or abnormality. No abdominopelvic ascites.   Musculoskeletal: Degenerative changes affect the spine. No focal osseous lesions are seen.   IMPRESSION: 1. Pulmonary nodules measuring up to 4 mm are unchanged from the prior examination. No new pulmonary nodules. No follow-up needed if patient is low-risk (and has no known or suspected primary neoplasm). Non-contrast chest CT can be considered in 12 months if patient is high-risk. This recommendation follows the consensus statement: Guidelines for Management of Incidental Pulmonary Nodules Detected on CT Images: From the Fleischner Society 2017; Radiology 2017; 284:228-243. 2. Mild bladder wall thickening with surrounding inflammation concerning for cystitis. 3. Mild cardiomegaly. 4. Stable prominence and low-attenuation of the right ovary. Please see pelvic ultrasound from 12/02/2020.    Assessment:  CELITA HEGGER is a 72 y.o. female diagnosed with locally advanced stage IIIC-r cervical cancer with right ovarian mass which may represent metastatic disease versus more likely  benign ovarian cyst/TOA/separate primary (the latter is unlikely) s/p external beam radiotherapy with weekly cisplatin completed in 10/22. Then had brachytherapy x 5 from 12/22-1/23. She had PET for restaging on 05/09/21 which showed excellent response to treatment. No residual hypermetabolic tumor was identified.  Excellent response based on exam. NED on exam and CT scan shows no evidence of disease 9/23. Indeterminate pulmonary lesions.  Mucosal radiation reaction of upper vagina.  Profuse foul smelling discharge resolved with Metrogel.    NED today.  Vaginal bleeding has stopped.    Medical co-morbidities complicating care: There is no height or weight on file to calculate BMI. Positive cocaine use. Type 2 diabetes mellitus without complication; h/o sepsis; HTN; Chronic diastolic CHF (congestive heart failure), NYHA class 2 (HCC) Plan:   Problem List Items Addressed This Visit       Genitourinary   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HCC) - Primary   She will follow up with Dr.Chrystal as scheduled and see [place] 3 months later.   She will follow up with [facility] for thyroid cancer.    IV port in place and being flushed.   The patient's diagnosis, an outline of the further diagnostic and laboratory studies which will be required, the recommendation, and alternatives were discussed.  All questions were answered to the patient's satisfaction.  Rebecca Lauth, MD

## 2022-10-16 ENCOUNTER — Inpatient Hospital Stay: Payer: 59 | Attending: Oncology

## 2022-10-16 DIAGNOSIS — Z452 Encounter for adjustment and management of vascular access device: Secondary | ICD-10-CM | POA: Diagnosis present

## 2022-10-16 DIAGNOSIS — Z8541 Personal history of malignant neoplasm of cervix uteri: Secondary | ICD-10-CM | POA: Insufficient documentation

## 2022-10-16 DIAGNOSIS — Z95828 Presence of other vascular implants and grafts: Secondary | ICD-10-CM

## 2022-10-16 MED ORDER — HEPARIN SOD (PORK) LOCK FLUSH 100 UNIT/ML IV SOLN
500.0000 [IU] | Freq: Once | INTRAVENOUS | Status: AC
  Administered 2022-10-16: 500 [IU] via INTRAVENOUS
  Filled 2022-10-16: qty 5

## 2022-10-16 MED ORDER — SODIUM CHLORIDE 0.9% FLUSH
10.0000 mL | Freq: Once | INTRAVENOUS | Status: AC
  Administered 2022-10-16: 10 mL via INTRAVENOUS
  Filled 2022-10-16: qty 10

## 2022-11-27 ENCOUNTER — Inpatient Hospital Stay: Payer: 59 | Attending: Oncology

## 2022-11-27 DIAGNOSIS — Z95828 Presence of other vascular implants and grafts: Secondary | ICD-10-CM

## 2022-11-27 DIAGNOSIS — Z452 Encounter for adjustment and management of vascular access device: Secondary | ICD-10-CM | POA: Diagnosis present

## 2022-11-27 DIAGNOSIS — Z8541 Personal history of malignant neoplasm of cervix uteri: Secondary | ICD-10-CM | POA: Diagnosis present

## 2022-11-27 MED ORDER — HEPARIN SOD (PORK) LOCK FLUSH 100 UNIT/ML IV SOLN
500.0000 [IU] | Freq: Once | INTRAVENOUS | Status: AC
  Administered 2022-11-27: 500 [IU] via INTRAVENOUS
  Filled 2022-11-27: qty 5

## 2022-11-27 MED ORDER — SODIUM CHLORIDE 0.9% FLUSH
10.0000 mL | Freq: Once | INTRAVENOUS | Status: AC
  Administered 2022-11-27: 10 mL via INTRAVENOUS
  Filled 2022-11-27: qty 10

## 2023-01-08 ENCOUNTER — Inpatient Hospital Stay: Payer: 59 | Attending: Oncology

## 2023-01-08 DIAGNOSIS — Z452 Encounter for adjustment and management of vascular access device: Secondary | ICD-10-CM | POA: Insufficient documentation

## 2023-01-08 DIAGNOSIS — Z8541 Personal history of malignant neoplasm of cervix uteri: Secondary | ICD-10-CM | POA: Insufficient documentation

## 2023-01-17 ENCOUNTER — Inpatient Hospital Stay: Payer: 59

## 2023-01-17 DIAGNOSIS — Z8541 Personal history of malignant neoplasm of cervix uteri: Secondary | ICD-10-CM | POA: Diagnosis present

## 2023-01-17 DIAGNOSIS — Z95828 Presence of other vascular implants and grafts: Secondary | ICD-10-CM

## 2023-01-17 DIAGNOSIS — Z452 Encounter for adjustment and management of vascular access device: Secondary | ICD-10-CM | POA: Diagnosis present

## 2023-01-17 MED ORDER — SODIUM CHLORIDE 0.9% FLUSH
10.0000 mL | INTRAVENOUS | Status: DC | PRN
  Administered 2023-01-17: 10 mL via INTRAVENOUS
  Filled 2023-01-17: qty 10

## 2023-01-17 MED ORDER — HEPARIN SOD (PORK) LOCK FLUSH 100 UNIT/ML IV SOLN
500.0000 [IU] | Freq: Once | INTRAVENOUS | Status: AC
  Administered 2023-01-17: 500 [IU] via INTRAVENOUS
  Filled 2023-01-17: qty 5

## 2023-02-06 ENCOUNTER — Other Ambulatory Visit: Payer: Self-pay | Admitting: "Endocrinology

## 2023-02-06 DIAGNOSIS — C73 Malignant neoplasm of thyroid gland: Secondary | ICD-10-CM

## 2023-02-12 ENCOUNTER — Ambulatory Visit
Admission: RE | Admit: 2023-02-12 | Discharge: 2023-02-12 | Disposition: A | Discharge status: Home / Self Care | Payer: 59 | Source: Ambulatory Visit | Attending: "Endocrinology | Admitting: "Endocrinology

## 2023-02-12 DIAGNOSIS — C73 Malignant neoplasm of thyroid gland: Secondary | ICD-10-CM | POA: Diagnosis present

## 2023-02-19 ENCOUNTER — Inpatient Hospital Stay: Payer: 59 | Attending: Oncology

## 2023-03-12 ENCOUNTER — Inpatient Hospital Stay: Payer: 59 | Attending: Oncology | Admitting: Obstetrics and Gynecology

## 2023-03-12 ENCOUNTER — Encounter: Payer: Self-pay | Admitting: Obstetrics and Gynecology

## 2023-03-12 ENCOUNTER — Inpatient Hospital Stay: Payer: 59

## 2023-03-12 VITALS — BP 158/101 | HR 88 | Temp 97.6°F | Resp 20 | Wt 136.8 lb

## 2023-03-12 DIAGNOSIS — I10 Essential (primary) hypertension: Secondary | ICD-10-CM | POA: Diagnosis not present

## 2023-03-12 DIAGNOSIS — C539 Malignant neoplasm of cervix uteri, unspecified: Secondary | ICD-10-CM

## 2023-03-12 DIAGNOSIS — Z452 Encounter for adjustment and management of vascular access device: Secondary | ICD-10-CM | POA: Diagnosis not present

## 2023-03-12 DIAGNOSIS — Z95828 Presence of other vascular implants and grafts: Secondary | ICD-10-CM | POA: Diagnosis not present

## 2023-03-12 DIAGNOSIS — Z9221 Personal history of antineoplastic chemotherapy: Secondary | ICD-10-CM | POA: Insufficient documentation

## 2023-03-12 DIAGNOSIS — Z8541 Personal history of malignant neoplasm of cervix uteri: Secondary | ICD-10-CM | POA: Diagnosis present

## 2023-03-12 DIAGNOSIS — F1490 Cocaine use, unspecified, uncomplicated: Secondary | ICD-10-CM | POA: Insufficient documentation

## 2023-03-12 DIAGNOSIS — C73 Malignant neoplasm of thyroid gland: Secondary | ICD-10-CM | POA: Diagnosis not present

## 2023-03-12 DIAGNOSIS — Z923 Personal history of irradiation: Secondary | ICD-10-CM | POA: Diagnosis not present

## 2023-03-12 DIAGNOSIS — B977 Papillomavirus as the cause of diseases classified elsewhere: Secondary | ICD-10-CM | POA: Insufficient documentation

## 2023-03-12 MED ORDER — HEPARIN SOD (PORK) LOCK FLUSH 100 UNIT/ML IV SOLN
500.0000 [IU] | Freq: Once | INTRAVENOUS | Status: AC
  Administered 2023-03-12: 500 [IU] via INTRAVENOUS
  Filled 2023-03-12: qty 5

## 2023-03-12 MED ORDER — SODIUM CHLORIDE 0.9% FLUSH
10.0000 mL | Freq: Once | INTRAVENOUS | Status: AC
  Administered 2023-03-12: 10 mL via INTRAVENOUS
  Filled 2023-03-12: qty 10

## 2023-03-12 NOTE — Progress Notes (Signed)
Gynecologic Oncology Consult Visit   Referring Provider: Suzy Bouchard, MD   Chief Concern: cervical cancer  Subjective:  Rebecca Parker is a 72 y.o. G3P3 female who is seen in consultation from Dr. Feliberto Gottron for cervical cancer now s/p chemo and radiation completed 02/08/21.   She presents today for follow up. No new gyn complaints.   Last Pap was 09/10/2021 and was unsatisfactory for interpretation.  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   Gynecologic Oncology History:  12/01/2020-8/9/022 She was admitted from the ED for 1 day h/o right lower abdominal / pelvic pain . CT scan revealed a right ovarian cyst 5 cm and air in endometrial cavity. Bedside exam revealed a firm cervical rim and purulent d/c. WBC 23K concern for pyometra.    12/01/2020 Fx D+C  and cervical bx. Uterus sounded to 9 cm.  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   Initally she was thought of urosepsis however neg urine culture. In retrospect, urine was probably contaminated with purulent cervical discharge. She was treated with gent and Cleocin switched to oral Flagyl and is currently on Augmentin. Clinically patient's pain improved with IV/ PO abx . Hospitalist helped managed DM and CHF ( no significant tx) She was also diagnosed with anemia. Hypoalbuminemia, and thrombocytosis. Her tox screen was positive for cocaine she says she only uses occasionally.    On pelvic exam suspect lateral vaginal disease >50% down the vault and disease involving the left fornix. Cervix enlarged >4 cm with grossly obvious tumor and hard to palpation. Uterus - may be enlarged with firm mass on the right aspect versus palpation of the right adnexal mass. Positive parametrial involvement on the right and on the left with disease to or almost to the sidewall on the left. Rectovaginal exam was confirmatory.       8/31 2022, PET scan showed signs of cervical cancer with diffuse involvement of the uterus.  Cystic and solid right ovarian lesion more likely related to diffuse cervical cancer.  Concomitant synchronous cystic ovarian neoplasm could have appearance but feel less likely given the diffuse nature of disease throughout the uterus and cervix.  Small lymph nodes in the pelvis in the left pelvis suspicious for nodal involvement at the common iliac level.  No solid organ distant metastasis.  Treated with external radiation therapy with weekly cisplatin.   She had pet for restaging on 05/09/21 which showed excellent response to treatment. No residual hypermetabolic tumor was identified. Persistent partially cystic right adnexal mass. Slightly smaller and SUV has decreased. Persistent known thyroid neoplasm (see below biopsy).   Then received brachytherapy with Dr Marinell Blight at [facility] in 1/23.   4/23 noted to have copious foul smelling vaginal discharge and prescribed Metrogel.  Discharge and odor improved.   01/10/2022 CT C/A/P IMPRESSION: 1. Pulmonary nodules measuring up to 4 mm are unchanged from the prior examination. No new pulmonary nodules. No follow-up needed if patient is low-risk (and has no known or suspected primary neoplasm). Non-contrast chest CT can be considered in 12 months if patient is high-risk. This recommendation follows the consensus statement: Guidelines for Management of Incidental Pulmonary Nodules Detected on CT Images: From the Fleischner Society 2017; Radiology 2017; 284:228-243. 2. Mild bladder wall thickening with surrounding inflammation concerning for cystitis. 3. Mild cardiomegaly. 4. Stable prominence and low-attenuation of the right ovary. Please see  pelvic ultrasound from 12/02/2020.  Thyroid Cancer History   She had thyroid biopsy on 03/05/21.  A. THYROID GLAND, RIGHT MIDDLE LOBE; ULTRASOUND-GUIDED FINE-NEEDLE ASPIRATION:  - SUSPICIOUS FOR FOLLICULAR NEOPLASM  ([place] CATEGORY IV).   She was seen by ENT on 02/13/2021 and there was no evidence of tonsillary or ENT cancer on exam. HIV test negative 11/2020. Thyroidectomy 12/18/21  Problem List: Patient Active Problem List   Diagnosis Date Noted   Port-A-Cath in place 01/17/2021   Abnormal positron emission tomography (PET) scan 01/17/2021   Tobacco use 01/03/2021   Encounter for antineoplastic chemotherapy 01/03/2021   Vitamin B12 deficiency 01/03/2021   B12 deficiency anemia 01/03/2021   Normocytic anemia 12/23/2020   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HCC) 12/23/2020   Goals of care, counseling/discussion 12/23/2020   Cocaine abuse (HCC) 12/23/2020   Sepsis (HCC) 12/01/2020   Ovarian mass, right 12/01/2020   Acute lower UTI 12/01/2020   Pelvic pain 12/01/2020   Morbid (severe) obesity due to excess calories (HCC) 11/15/2015   Mixed incontinence 10/24/2015   Venous insufficiency of both lower extremities 11/24/2014   Benign essential hypertension 11/18/2014   Chronic diastolic CHF (congestive heart failure), NYHA class 2 (HCC) 05/17/2014   Mixed hyperlipidemia 05/17/2014   Tobacco dependence 02/10/2014   Thyroid nodule 02/10/2014   Spinal stenosis, lumbar region without neurogenic claudication 03/05/2013   Constipation 01/29/2013   Eosinophil count raised 10/20/2012   Urinary incontinence 10/19/2012   Type 2 diabetes mellitus without complications (HCC) 07/24/2012   Neck pain 06/15/2012   Polyneuropathy 11/15/2011   Palmar fascial fibromatosis 09/13/2011   Edema 05/29/2011   Vitamin D deficiency 07/31/2009   Insomnia 07/27/2009   Past Medical History: 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, cN1, cM0); Tx'd with Cisplatin + EBRT   Chest pain, unspecified    CHF (congestive heart failure) (HCC)    Cocaine use    Diabetes mellitus without complication (HCC)     Hip pain    HLD (hyperlipidemia)    Hyperplastic colon polyp    Hypertension    Multinodular goiter    Thyroid mass    a.) 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)   Tubular adenoma of colon     Past Surgical History: Past Surgical History:  Procedure Laterality Date   ABDOMINAL HYSTERECTOMY     COLONOSCOPY WITH PROPOFOL N/A 11/24/2015   Procedure: COLONOSCOPY WITH PROPOFOL;  Surgeon: Christena Deem, MD;  Location: [facility] ENDOSCOPY;  Service: Endoscopy;  Laterality: N/A;   COLONOSCOPY WITH PROPOFOL N/A 03/18/2019   Procedure: COLONOSCOPY WITH PROPOFOL;  Surgeon: Pasty Spillers, MD;  Location: [facility] ENDOSCOPY;  Service: Endoscopy;  Laterality: N/A;   DILATION AND CURETTAGE OF UTERUS N/A 12/01/2020   Procedure: DILATATION AND CURETTAGE with cervical biopsies;  Surgeon: Schermerhorn, Ihor Austin, MD;  Location: [facility] ORS;  Service: Gynecology;  Laterality: N/A;   ESOPHAGOGASTRODUODENOSCOPY (EGD) WITH PROPOFOL N/A 03/18/2019   Procedure: ESOPHAGOGASTRODUODENOSCOPY (EGD) WITH PROPOFOL;  Surgeon: Pasty Spillers, MD;  Location: [facility] ENDOSCOPY;  Service: Endoscopy;  Laterality: N/A;   PORTA CATH INSERTION N/A 01/16/2021   Procedure: PORTA CATH INSERTION;  Surgeon: Renford Dills, MD;  Location: [facility] INVASIVE CV LAB;  Service: Cardiovascular;  Laterality: N/A;   right ankle orif      Past Gynecologic History:  As per HPI.  She does not recall dates of menarche, LMP. She does have a h/o abnormal Pap, but last Pap not listed.   OB History:  OB History  Gravida Para Term Preterm AB Living  3 3          SAB IAB Ectopic Multiple Live Births               # Outcome Date GA Lbr Len/2nd Weight Sex Type Anes PTL Lv  3 Para           2 Para           1 Para             Family History: Family History  Problem Relation Age of Onset   Breast cancer Maternal Aunt    Breast cancer Paternal Aunt     Social History: Social History    Socioeconomic History   Marital status: Legally Separated    Spouse name: Not on file   Number of children: Not on file   Years of education: Not on file   Highest education level: Not on file  Occupational History   Not on file  Tobacco Use   Smoking status: Every Day    Current packs/day: 0.10    Types: Cigarettes   Smokeless tobacco: Never  Substance and Sexual Activity   Alcohol use: Not Currently   Drug use: Yes    Types: Cocaine    Comment: last year   Sexual activity: Not Currently  Other Topics Concern   Not on file  Social History Narrative   Not on file   Social Determinants of Health   Financial Resource Strain: Not on file  Food Insecurity: Not on file  Transportation Needs: Not on file  Physical Activity: Not on file  Stress: Not on file  Social Connections: Not on file  Intimate Partner Violence: Not on file    Allergies: Allergies  Allergen Reactions   Oxybutynin Itching and Other (See Comments)   Pregabalin Other (See Comments)    Hallucentations   Ace Inhibitors Itching    Rash   Cyclobenzaprine Itching    Rash and 'made me nervous'    Current Medications: Current Outpatient Medications  Medication Sig Dispense Refill   aspirin EC 81 MG tablet Take 81 mg by mouth daily.     Calcium Carbonate-Vitamin D 500-125 MG-UNIT TABS Take 1 tablet by mouth daily.     docusate sodium (COLACE) 100 MG capsule Take 100 mg by mouth daily.     Ferrous Sulfate (IRON) 325 (65 Fe) MG TABS Take 1 tablet by mouth 2 (two) times daily.     furosemide (LASIX) 20 MG tablet Take 20 mg by mouth 2 (two) times daily.     gabapentin (NEURONTIN) 800 MG tablet Take 800 mg by mouth 2 (two) times daily.     hydrOXYzine (ATARAX/VISTARIL) 25 MG tablet Take 25 mg by mouth daily. Take 25mg . By mouth every morning     lidocaine-prilocaine (EMLA) cream Apply 1 application topically as needed.     losartan (COZAAR) 50 MG tablet Take 50 mg by mouth 2 (two) times daily.      metFORMIN (GLUCOPHAGE) 500 MG tablet Take 500 mg by mouth 2 (two) times daily with a meal.     metroNIDAZOLE (METROGEL) 0.75 % vaginal gel Place 5 mg (1 applicator) in vagina at night every 3 days to prevent recurrent vaginal infection 70 g 3   mirtazapine (REMERON) 15 MG tablet Take 15  mg by mouth at bedtime.     ondansetron (ZOFRAN) 4 MG tablet Take 1 tablet (4 mg total) by mouth every 6 (six) hours as needed for nausea. 20 tablet 0   oxyCODONE (OXY IR/ROXICODONE) 5 MG immediate release tablet Take 1 tablet (5 mg total) by mouth every 8 (eight) hours as needed for moderate pain. 15 tablet 0   simvastatin (ZOCOR) 20 MG tablet Take 20 mg by mouth daily.     No current facility-administered medications for this visit.    Review of Systems General: Fatigue and weakness otherwise no complaints  HEENT: no complaints  Lungs: Shortness of breath otherwise no complaints  Cardiac: no complaints  GI: no complaints  GU: no complaints  Musculoskeletal: no complaints  Extremities: no complaints  Skin: no complaints  Neuro: no complaints  Endocrine: no complaints  Psych: no complaints       Objective:  Physical Examination:  BP (!) 158/101 (BP Location: Left Arm, Patient Position: Sitting) Comment: Instructed to follow up with PCP regarding elevated BP  Pulse 88   Temp 97.6 F (36.4 C)   Resp 20   Wt 136 lb 12.8 oz (62.1 kg)   SpO2 100%   BMI 25.43 kg/m    GENERAL: Patient is a well appearing female in no acute distress.  She uses a walker HEENT:  Atraumatic and normocephalic.  NODES:  No cervical, supraclavicular, axillary, or inguinal lymphadenopathy palpated.  LUNGS: Normal respiratory effort  ABDOMEN:  Soft, nontender. Nondistended. No masses/ascites/hernia/or hepatomegaly.  EXTREMITIES:  No peripheral edema.   SKIN:  Clear with no obvious rashes or skin changes. NEURO:  Nonfocal. Well oriented.  Appropriate affect.  Pelvic: EGBUS: no lesions Cervix: Unable to identify the  cervix at all due to upper vaginal agglutination and stenosis Vagina: A 6 to 7 mm erythematous lesion on the left sidewall which may have occurred due to speculum insertion.  She has very atrophic vaginal epithelium. No discharge.  Pap obtained.  During obtaining the Pap smear she did have some bleeding noted Uterus: Not grossly enlarged and nontender.  On prior exam noted to have "uterus may be enlarged with firm mass on the right aspect versus palpation of the right adnexal mass." BME: no palpable masses but very limited exam due to vaginal agglutination and patient discomfort.  The parametria is smooth.  Lab Review N/A  Radiologic Imaging: EXAM: 12/01/2020 CT ABDOMEN AND PELVIS WITH CONTRAST   TECHNIQUE: Multidetector CT imaging of the abdomen and pelvis was performed using the standard protocol following bolus administration of intravenous contrast.   CONTRAST:  80mL OMNIPAQUE IOHEXOL 350 MG/ML SOLN   COMPARISON:  11/04/2018   FINDINGS: Lower chest: No acute abnormality.   Hepatobiliary: No solid liver abnormality is seen. No gallstones, gallbladder wall thickening, or biliary dilatation.   Pancreas: Unremarkable. No pancreatic ductal dilatation or surrounding inflammatory changes.   Spleen: Normal in size without significant abnormality.   Adrenals/Urinary Tract: Adrenal glands are unremarkable. Kidneys are normal, without renal calculi, solid lesion, or hydronephrosis. Bladder is unremarkable.   Stomach/Bowel: Stomach is within normal limits. Appendix appears normal. No evidence of bowel wall thickening, distention, or inflammatory changes. Pancolonic diverticulosis.   Vascular/Lymphatic: Aortic atherosclerosis. No enlarged abdominal or pelvic lymph nodes.   Reproductive: Air within the fundal endometrial cavity (series 6, image 86). There is a new, thickly septated right ovarian mass measuring 5.1 x 5.1 cm (series 2, image 64).   Other: No abdominal wall hernia or  abnormality. No abdominopelvic  ascites.   Musculoskeletal: No acute or significant osseous findings.   IMPRESSION: 1. There is a new, thickly septated right ovarian mass measuring 5.1 x 5.1 cm. 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 Recommend initial pelvic ultrasound and likely subsequent MRI to further evaluate, which may be performed on a nonemergent basis. 2. Air within the fundal endometrial cavity. Correlate for recent instrumentation. 3. Pancolonic diverticulosis without evidence of acute diverticulitis.   CXR 12/01/2020 IMPRESSION: Cardiac enlargement without acute cardiopulmonary abnormality.  CT scan 09/17/21 CT CHEST FINDINGS   Cardiovascular: Normal heart size. No pericardial effusions. Normal caliber thoracic aorta. Calcification of the aorta and coronary arteries. Central venous catheter with tip in the low SVC region.   Mediastinum/Nodes: Esophagus is decompressed. No significant lymphadenopathy in the chest. Partially calcified heterogeneous mass in the right lobe of the thyroid gland measuring 2.8 cm diameter and corresponding to known neoplasm. No change since prior PET CT.   Lungs/Pleura: Scattered emphysematous changes in the lungs. Several pulmonary nodules are identified including a right lower lung nodule measuring 5 mm diameter, series 3, image 108, and a left lingular nodule measuring 3 mm diameter, image 71. These nodules were not definitively identified on the prior PET-CT but could have been obscured by motion artifact.   Musculoskeletal: Degenerative changes in the spine. No destructive bone lesions.   CT ABDOMEN PELVIS FINDINGS   Hepatobiliary: No focal liver abnormality is seen. No gallstones, gallbladder wall thickening, or biliary dilatation.   Pancreas: Unremarkable. No pancreatic ductal dilatation or surrounding inflammatory changes.   Spleen: Normal in size without focal abnormality.   Adrenals/Urinary Tract: Adrenal glands  are unremarkable. Kidneys are normal, without renal calculi, focal lesion, or hydronephrosis. Bladder is unremarkable.   Stomach/Bowel: Stomach is within normal limits. Appendix is not identified. No evidence of bowel wall thickening, distention, or inflammatory changes.   Vascular/Lymphatic: Calcification throughout the abdominal aorta. No aneurysm. Retroperitoneal and pelvic lymph nodes are not pathologically enlarged.   Reproductive: Complex cystic change demonstrated within the right ovary, measuring 2.7 x 4.7 cm. This is decreasing in size since previous studies. Uterus and left ovary are otherwise normal.   Other: No free air or free fluid in the abdomen. Abdominal wall musculature appears intact.   Musculoskeletal: Degenerative changes in the spine and hips. No destructive bone lesions. Postoperative changes in the lower lumbar spine.   IMPRESSION: 1. No evidence of metastatic disease in the chest, abdomen, or pelvis. 2. Pulmonary nodules bilaterally, measuring 5 and 3 mm. These were not definitely present on the previous study but could have been obscured by motion artifact. Due to the history of primary cancer, 3 to six-month follow-up CT is suggested for further evaluation. 3. Decreasing size of complex cystic changes in the right ovary. 4. Unchanged appearance of known right thyroid neoplasm.  CT scan 01/09/22 FINDINGS: CT CHEST FINDINGS   Cardiovascular: Heart is mildly enlarged. Aorta is normal in size. There are atherosclerotic calcifications of the aorta and coronary arteries. There is no pericardial effusion. Right chest port catheter tip ends in the right atrium.   Mediastinum/Nodes: No enlarged mediastinal, hilar, or axillary lymph nodes. Thyroid gland, trachea, and esophagus demonstrate no significant findings.   Lungs/Pleura: There is a 3 mm ground-glass nodule in the right lower lobe image 4/94 which is unchanged. There is a stable 3 mm nodule in the  left upper lobe image 3/70. There is a stable 4 mm nodule in the left lower lobe image 4/84. No new pulmonary nodules. No  focal lung infiltrate   Musculoskeletal: No chest wall mass or suspicious bone lesions identified.   CT ABDOMEN PELVIS FINDINGS   Hepatobiliary: No focal liver abnormality is seen. No gallstones, gallbladder wall thickening, or biliary dilatation.   Pancreas: Unremarkable. No pancreatic ductal dilatation or surrounding inflammatory changes.   Spleen: Normal in size without focal abnormality.   Adrenals/Urinary Tract: Mild wall thickening and inflammation of the bladder. The kidneys and adrenal glands are within normal limits.   Stomach/Bowel: Stomach is within normal limits. Appendix appears normal. No evidence of bowel wall thickening, distention, or inflammatory changes. There is diffuse colonic diverticulosis.   Vascular/Lymphatic: Aortic atherosclerosis. No enlarged abdominal or pelvic lymph nodes.   Reproductive: Uterus is within normal limits. The right ovary is prominent in size and low-attenuation similar to the prior study. Left ovary is within normal limits.   Other: No abdominal wall hernia or abnormality. No abdominopelvic ascites.   Musculoskeletal: Degenerative changes affect the spine. No focal osseous lesions are seen.   IMPRESSION: 1. Pulmonary nodules measuring up to 4 mm are unchanged from the prior examination. No new pulmonary nodules. No follow-up needed if patient is low-risk (and has no known or suspected primary neoplasm). Non-contrast chest CT can be considered in 12 months if patient is high-risk. This recommendation follows the consensus statement: Guidelines for Management of Incidental Pulmonary Nodules Detected on CT Images: From the Fleischner Society 2017; Radiology 2017; 284:228-243. 2. Mild bladder wall thickening with surrounding inflammation concerning for cystitis. 3. Mild cardiomegaly. 4. Stable prominence and  low-attenuation of the right ovary. Please see pelvic ultrasound from 12/02/2020.    Assessment:  Rebecca Parker is a 72 y.o. female diagnosed with locally advanced stage IIIC-r cervical cancer with right ovarian mass which may represent metastatic disease versus more likely benign ovarian cyst/TOA/separate primary (the latter is unlikely) s/p external beam radiotherapy with weekly cisplatin completed in 10/22. Then had brachytherapy x 5 from 12/22-1/23. She had PET for restaging on 05/09/21 which showed excellent response to treatment. No residual hypermetabolic tumor was identified.  Excellent response based on exam. NED on exam and CT scan shows no evidence of disease 9/23. Indeterminate pulmonary lesions.  Mucosal radiation reaction of upper vagina.  HRHPV positive, unsatisfactory Pap  HTN, asymptomatic  Medical co-morbidities complicating care: Body mass index is 25.43 kg/m. Positive cocaine use. Type 2 diabetes mellitus without complication; h/o sepsis; HTN; Chronic diastolic CHF (congestive heart failure), NYHA class 2 (HCC) Plan:   Problem List Items Addressed This Visit       Genitourinary   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HCC) - Primary   Relevant Orders   IGP, Aptima HPV     Other   Port-A-Cath in place   Other Visit Diagnoses     HPV in female           She will follow up in 3 months to check on her vaginal exam.  Follow-up Pap smear.  Precautions given regarding high blood pressure and stroke.  Recommended that she follow-up with her primary care provider.  She will follow up with [facility] for thyroid cancer.    IV port in place and being flushed.  We will need to address IV port removal after her next visit which will be 2 years after her treatment was completed  The patient's diagnosis, an outline of the further diagnostic and laboratory studies which will be required, the recommendation, and alternatives were discussed.  All questions were answered to the patient's  satisfaction.  Mayana Irigoyen Leta Jungling, MD

## 2023-03-17 LAB — IGP, APTIMA HPV: HPV Aptima: POSITIVE — AB

## 2023-05-06 ENCOUNTER — Other Ambulatory Visit: Payer: Self-pay | Admitting: Family Medicine

## 2023-05-06 DIAGNOSIS — Z1231 Encounter for screening mammogram for malignant neoplasm of breast: Secondary | ICD-10-CM

## 2023-05-13 ENCOUNTER — Encounter: Payer: Self-pay | Admitting: Oncology

## 2023-05-14 ENCOUNTER — Encounter: Payer: Self-pay | Admitting: Radiology

## 2023-05-14 ENCOUNTER — Ambulatory Visit
Admission: RE | Admit: 2023-05-14 | Discharge: 2023-05-14 | Disposition: A | Discharge status: Home / Self Care | Payer: 59 | Source: Ambulatory Visit | Attending: Family Medicine | Admitting: Family Medicine

## 2023-05-14 DIAGNOSIS — Z1231 Encounter for screening mammogram for malignant neoplasm of breast: Secondary | ICD-10-CM | POA: Insufficient documentation

## 2023-06-11 ENCOUNTER — Encounter: Payer: Self-pay | Admitting: Oncology

## 2023-06-17 ENCOUNTER — Telehealth: Payer: Self-pay

## 2023-06-17 NOTE — Telephone Encounter (Signed)
Called and spoke to daughter Dondra Spry. I have attempted to call Ms Maeder with no answer. Clinic will be closing early tomorrow, 2/19, due to the weather and we need to reschedule her appointment. I have asked Dondra Spry to have Ms Polhamus call us to reschedule her appointment. She will tell her not to come and to call us to reschedule.

## 2023-06-18 ENCOUNTER — Inpatient Hospital Stay: Payer: 59

## 2023-06-20 ENCOUNTER — Inpatient Hospital Stay: Payer: 59 | Attending: Oncology

## 2023-06-20 DIAGNOSIS — C530 Malignant neoplasm of endocervix: Secondary | ICD-10-CM | POA: Diagnosis present

## 2023-06-20 DIAGNOSIS — Z452 Encounter for adjustment and management of vascular access device: Secondary | ICD-10-CM | POA: Diagnosis not present

## 2023-06-20 DIAGNOSIS — Z95828 Presence of other vascular implants and grafts: Secondary | ICD-10-CM

## 2023-06-20 MED ORDER — HEPARIN SOD (PORK) LOCK FLUSH 100 UNIT/ML IV SOLN
500.0000 [IU] | Freq: Once | INTRAVENOUS | Status: AC
  Administered 2023-06-20: 500 [IU] via INTRAVENOUS
  Filled 2023-06-20: qty 5

## 2023-06-20 MED ORDER — SODIUM CHLORIDE 0.9% FLUSH
10.0000 mL | Freq: Once | INTRAVENOUS | Status: AC
  Administered 2023-06-20: 10 mL via INTRAVENOUS
  Filled 2023-06-20: qty 10

## 2023-07-30 ENCOUNTER — Inpatient Hospital Stay: Payer: 59 | Attending: Oncology

## 2023-08-02 ENCOUNTER — Other Ambulatory Visit: Payer: Self-pay

## 2023-08-02 ENCOUNTER — Emergency Department
Admission: EM | Admit: 2023-08-02 | Discharge: 2023-08-02 | Disposition: A | Discharge status: Home / Self Care | Attending: Emergency Medicine | Admitting: Emergency Medicine

## 2023-08-02 DIAGNOSIS — R799 Abnormal finding of blood chemistry, unspecified: Secondary | ICD-10-CM | POA: Diagnosis present

## 2023-08-02 LAB — CBC WITH DIFFERENTIAL/PLATELET
Abs Immature Granulocytes: 0.02 10*3/uL (ref 0.00–0.07)
Basophils Absolute: 0 10*3/uL (ref 0.0–0.1)
Basophils Relative: 0 %
Eosinophils Absolute: 0.5 10*3/uL (ref 0.0–0.5)
Eosinophils Relative: 6 %
HCT: 35.7 % — ABNORMAL LOW (ref 36.0–46.0)
Hemoglobin: 11.6 g/dL — ABNORMAL LOW (ref 12.0–15.0)
Immature Granulocytes: 0 %
Lymphocytes Relative: 14 %
Lymphs Abs: 1.1 10*3/uL (ref 0.7–4.0)
MCH: 33.3 pg (ref 26.0–34.0)
MCHC: 32.5 g/dL (ref 30.0–36.0)
MCV: 102.6 fL — ABNORMAL HIGH (ref 80.0–100.0)
Monocytes Absolute: 0.6 10*3/uL (ref 0.1–1.0)
Monocytes Relative: 8 %
Neutro Abs: 5.5 10*3/uL (ref 1.7–7.7)
Neutrophils Relative %: 72 %
Platelets: 365 10*3/uL (ref 150–400)
RBC: 3.48 MIL/uL — ABNORMAL LOW (ref 3.87–5.11)
RDW: 13.8 % (ref 11.5–15.5)
WBC: 7.8 10*3/uL (ref 4.0–10.5)
nRBC: 0 % (ref 0.0–0.2)

## 2023-08-02 LAB — COMPREHENSIVE METABOLIC PANEL WITH GFR
ALT: 12 U/L (ref 0–44)
AST: 19 U/L (ref 15–41)
Albumin: 3.6 g/dL (ref 3.5–5.0)
Alkaline Phosphatase: 59 U/L (ref 38–126)
Anion gap: 12 (ref 5–15)
BUN: 22 mg/dL (ref 8–23)
CO2: 23 mmol/L (ref 22–32)
Calcium: 6.8 mg/dL — ABNORMAL LOW (ref 8.9–10.3)
Chloride: 105 mmol/L (ref 98–111)
Creatinine, Ser: 1.79 mg/dL — ABNORMAL HIGH (ref 0.44–1.00)
GFR, Estimated: 29 mL/min — ABNORMAL LOW (ref >60)
Glucose, Bld: 74 mg/dL (ref 70–99)
Potassium: 4.8 mmol/L (ref 3.5–5.1)
Sodium: 140 mmol/L (ref 135–145)
Total Bilirubin: 0.4 mg/dL (ref 0.0–1.2)
Total Protein: 7.2 g/dL (ref 6.5–8.1)

## 2023-08-02 LAB — MAGNESIUM: Magnesium: 2.3 mg/dL (ref 1.7–2.4)

## 2023-08-02 MED ORDER — CALCIUM GLUCONATE-NACL 2-0.675 GM/100ML-% IV SOLN
2.0000 g | Freq: Once | INTRAVENOUS | Status: AC
  Administered 2023-08-02: 2000 mg via INTRAVENOUS
  Filled 2023-08-02: qty 100

## 2023-08-02 MED ORDER — CALCITRIOL 0.25 MCG PO CAPS: 0.2500 ug | ORAL_CAPSULE | Freq: Two times a day (BID) | ORAL | 0 refills | Status: AC

## 2023-08-02 MED ORDER — CALCIUM GLUCONATE-NACL 1-0.675 GM/50ML-% IV SOLN: 1.0000 g | Freq: Once | INTRAVENOUS | Status: DC

## 2023-08-02 MED ORDER — HEPARIN SOD (PORK) LOCK FLUSH 100 UNIT/ML IV SOLN
500.0000 [IU] | Freq: Once | INTRAVENOUS | Status: AC
  Administered 2023-08-02: 500 [IU] via INTRAVENOUS
  Filled 2023-08-02: qty 5

## 2023-08-02 MED ORDER — HEPARIN NA (PORK) LOCK FLSH PF 10 UNIT/ML IV SOLN
10.0000 [IU] | Freq: Once | INTRAVENOUS | Status: DC
  Filled 2023-08-02: qty 5

## 2023-08-02 MED ORDER — SODIUM CHLORIDE 0.9 % IV BOLUS
1000.0000 mL | Freq: Once | INTRAVENOUS | Status: AC
  Administered 2023-08-02: 1000 mL via INTRAVENOUS

## 2023-08-02 NOTE — ED Triage Notes (Addendum)
 Patient reports got a call last night and was told her calcium was critically low.  Patient appears well.  Reports she has no thyroid and was taken off medication calcitrol and ever since has been weak and fatigue with no energy

## 2023-08-02 NOTE — ED Provider Notes (Signed)
 [facility] Provider Note    Event Date/Time   First MD Initiated Contact with Patient 08/02/23 1153     (approximate)   History   Abnormal Lab   HPI Rebecca Parker is a 73 y.o. female with history of thyroidectomy presenting today for hypocalcemia.  States she had routine lab workup recently and was called yesterday about her calcium level being severely low.  Was told to come to the ED for calcium supplementation.  She states she otherwise has been feeling well.  She had been taken off her calcitriol in the past and states she thinks this is the issue today.  Otherwise denies any body aches, chest pain, shortness of breath, lightheadedness, nausea, vomiting.  Eating and drinking well at home.  Chart review: Patient was seen in the ED in 01/2023 for similar issues with hypocalcemia requiring IV repletion before discharge.     Physical Exam   Triage Vital Signs: ED Triage Vitals  Encounter Vitals Group     BP 08/02/23 1058 (!) 143/88     Systolic BP Percentile --      Diastolic BP Percentile --      Pulse Rate 08/02/23 1058 95     Resp 08/02/23 1058 18     Temp 08/02/23 1058 98.2 F (36.8 C)     Temp src --      SpO2 08/02/23 1058 100 %     Weight 08/02/23 1059 136 lb (61.7 kg)     Height 08/02/23 1059 5' 1.5" (1.562 m)     Head Circumference --      Peak Flow --      Pain Score 08/02/23 1059 0     Pain Loc --      Pain Education --      Exclude from Growth Chart --     Most recent vital signs: Vitals:   08/02/23 1058  BP: (!) 143/88  Pulse: 95  Resp: 18  Temp: 98.2 F (36.8 C)  SpO2: 100%   Physical Exam: I have reviewed the vital signs and nursing notes. General: Awake, alert, no acute distress.  Nontoxic appearing. Head:  Atraumatic, normocephalic.   ENT:  EOM intact, PERRL. Oral mucosa is pink and moist with no lesions. Neck: Neck is supple with full range of motion, No meningeal signs. Cardiovascular:  RRR, No murmurs.  Peripheral pulses palpable and equal bilaterally. Respiratory:  Symmetrical chest wall expansion.  No rhonchi, rales, or wheezes.  Good air movement throughout.  No use of accessory muscles.   Musculoskeletal:  No cyanosis or edema. Moving extremities with full ROM Abdomen:  Soft, nontender, nondistended. Neuro:  GCS 15, moving all four extremities, interacting appropriately. Speech clear. Psych:  Calm, appropriate.   Skin:  Warm, dry, no rash.    ED Results / Procedures / Treatments   Labs (all labs ordered are listed, but only abnormal results are displayed) Labs Reviewed  CBC WITH DIFFERENTIAL/PLATELET - Abnormal; Notable for the following components:      Result Value   RBC 3.48 (*)    Hemoglobin 11.6 (*)    HCT 35.7 (*)    MCV 102.6 (*)    All other components within normal limits  COMPREHENSIVE METABOLIC PANEL WITH GFR - Abnormal; Notable for the following components:   Creatinine, Ser 1.79 (*)    Calcium 6.8 (*)    GFR, Estimated 29 (*)    All other components within normal limits  MAGNESIUM  EKG    RADIOLOGY    PROCEDURES:  Critical Care performed: No  Procedures   MEDICATIONS ORDERED IN ED: Medications  calcium gluconate 2 g/ 100 mL sodium chloride IVPB (has no administration in time range)  sodium chloride 0.9 % bolus 1,000 mL (has no administration in time range)     IMPRESSION / MDM / ASSESSMENT AND PLAN / ED COURSE  I reviewed the triage vital signs and the nursing notes.                              Differential diagnosis includes, but is not limited to, hypocalcemia secondary to thyroidectomy  Patient's presentation is most consistent with acute complicated illness / injury requiring diagnostic workup.  Patient is a 72 year old female presenting today for hypocalcemia with history of thyroidectomy.  She has no acute complaints at this time physical exam largely unremarkable.  Vital signs stable.  EKG reassuring at this time.  Calcium  level here at 6.8 and will replete with IV calcium gluconate 2 g.  Will also check magnesium level to make sure that is not a contributing factor being low.  Magnesium level normal.  Given that she is asymptomatic, no indication for admission.  Otherwise, patient will be discharged with prescription for calcitriol and told to follow-up with her PCP.  She wanted a referral for a new primary care doctor.  Given strict return precautions.  The patient is on the cardiac monitor to evaluate for evidence of arrhythmia and/or significant heart rate changes. Clinical Course as of 08/02/23 1241  Sat Aug 02, 2023  1227 Creatinine(!): 1.79 Most recent creatinine at 1.67.  Overall comparable. [DW]    Clinical Course User Index [DW] Janith Lima, MD     FINAL CLINICAL IMPRESSION(S) / ED DIAGNOSES   Final diagnoses:  Hypocalcemia     Rx / DC Orders   ED Discharge Orders          Ordered    calcitRIOL (ROCALTROL) 0.25 MCG capsule  2 times daily        08/02/23 1215    Ambulatory Referral to Primary Care (Establish Care)        08/02/23 1215             Note:  This document was prepared using Dragon voice recognition software and may include unintentional dictation errors.   Janith Lima, MD 08/02/23 [phone]

## 2023-08-02 NOTE — ED Notes (Addendum)
 Port was de-accessed. Patient tolerated procedure well.

## 2023-08-02 NOTE — ED Notes (Signed)
 Port was flushed with 10ml of NS prior to Heparin flush

## 2023-08-02 NOTE — Discharge Instructions (Signed)
 I have sent calcium medication to your pharmacy for you to take as prescribed.  I have also sent a referral for new primary care provider.  Otherwise follow-up with your regular doctor for recheck of your calcium labs in the upcoming weeks.

## 2023-08-06 ENCOUNTER — Encounter: Payer: Self-pay | Admitting: Ophthalmology

## 2023-08-06 NOTE — Anesthesia Preprocedure Evaluation (Addendum)
 Anesthesia Evaluation  Patient identified by MRN, date of birth, ID band Patient awake    Reviewed: Allergy & Precautions, H&P , NPO status , Patient's Chart, lab work & pertinent test results  Airway Mallampati: III  TM Distance: <3 FB Neck ROM: Full    Dental no notable dental hx. (+) Edentulous Upper, Edentulous Lower   Pulmonary Current Smoker   Pulmonary exam normal breath sounds clear to auscultation       Cardiovascular hypertension, +CHF  Normal cardiovascular exam Rhythm:Regular Rate:Normal  01-09-22  Cardiovascular: Heart is mildly enlarged. Aorta is normal in size. There are atherosclerotic calcifications of the aorta and coronary arteries. There is no pericardial effusion. Right chest port catheter tip ends in the right atrium.  12-16-13 INTERPRETATION  NORMAL LEFT VENTRICULAR SYSTOLIC FUNCTION WITH AN ESTIMATED EF = >55 %  NORMAL RIGHT VENTRICULAR SYSTOLIC FUNCTION  MILD MITRAL VALVE INSUFFICIENCY  NO VALVULAR STENOSIS  MILD LEFT ATRIUM ENLARGEMENT  DIASTOLIC DYSFUNCTION (GRADE 1)     Neuro/Psych  PSYCHIATRIC DISORDERS       Neuromuscular disease negative neurological ROS  negative psych ROS   GI/Hepatic negative GI ROS, Neg liver ROS,,,  Endo/Other  diabetesHypothyroidism    Renal/GU negative Renal ROS  negative genitourinary   Musculoskeletal negative musculoskeletal ROS (+)    Abdominal   Peds negative pediatric ROS (+)  Hematology negative hematology ROS (+) Blood dyscrasia, anemia   Anesthesia Other Findings Hx CHF and cocaine use: EKG day of surgery please---EKG done NSR, anteroseptal infarct, age undetermined, patient denies chest pain, pressure, dyspnea, fatigue, nausea now or recently Chest pain, unspecified  Hip pain Hypertension  Multinodular goiter Diabetes mellitus without complication CHF (congestive heart failure)  B12 deficiency  HLD (hyperlipidemia) Hyperplastic colon  polyp  Tubular adenoma of colon Cervical cancer (HCC)  Thyroid mass Cocaine use  Anemia Hypothyroidism  Edentulous    Reproductive/Obstetrics negative OB ROS                             Anesthesia Physical Anesthesia Plan  ASA: 3  Anesthesia Plan: MAC   Post-op Pain Management:    Induction: Intravenous  PONV Risk Score and Plan:   Airway Management Planned: Natural Airway and Nasal Cannula  Additional Equipment:   Intra-op Plan:   Post-operative Plan:   Informed Consent: I have reviewed the patients History and Physical, chart, labs and discussed the procedure including the risks, benefits and alternatives for the proposed anesthesia with the patient or authorized representative who has indicated his/her understanding and acceptance.     Dental Advisory Given  Plan Discussed with: Anesthesiologist, CRNA and Surgeon  Anesthesia Plan Comments: (Patient consented for risks of anesthesia including but not limited to:  - adverse reactions to medications - damage to eyes, teeth, lips or other oral mucosa - nerve damage due to positioning  - sore throat or hoarseness - Damage to heart, brain, nerves, lungs, other parts of body or loss of life  Patient voiced understanding and assent.)       Anesthesia Quick Evaluation

## 2023-08-13 NOTE — Discharge Instructions (Signed)

## 2023-08-14 ENCOUNTER — Ambulatory Visit: Payer: Self-pay | Admitting: Anesthesiology

## 2023-08-14 ENCOUNTER — Encounter
Admission: RE | Disposition: A | Discharge status: Home / Self Care | Payer: Self-pay | Source: Home / Self Care | Attending: Ophthalmology

## 2023-08-14 ENCOUNTER — Other Ambulatory Visit: Payer: Self-pay

## 2023-08-14 ENCOUNTER — Ambulatory Visit
Admission: RE | Admit: 2023-08-14 | Discharge: 2023-08-14 | Disposition: A | Discharge status: Home / Self Care | Attending: Ophthalmology | Admitting: Ophthalmology

## 2023-08-14 ENCOUNTER — Encounter: Payer: Self-pay | Admitting: Ophthalmology

## 2023-08-14 DIAGNOSIS — Z01818 Encounter for other preprocedural examination: Secondary | ICD-10-CM

## 2023-08-14 DIAGNOSIS — F17210 Nicotine dependence, cigarettes, uncomplicated: Secondary | ICD-10-CM | POA: Diagnosis not present

## 2023-08-14 DIAGNOSIS — Z7984 Long term (current) use of oral hypoglycemic drugs: Secondary | ICD-10-CM | POA: Insufficient documentation

## 2023-08-14 DIAGNOSIS — I700 Atherosclerosis of aorta: Secondary | ICD-10-CM | POA: Diagnosis not present

## 2023-08-14 DIAGNOSIS — E1136 Type 2 diabetes mellitus with diabetic cataract: Secondary | ICD-10-CM | POA: Insufficient documentation

## 2023-08-14 DIAGNOSIS — I110 Hypertensive heart disease with heart failure: Secondary | ICD-10-CM | POA: Insufficient documentation

## 2023-08-14 DIAGNOSIS — I5032 Chronic diastolic (congestive) heart failure: Secondary | ICD-10-CM | POA: Insufficient documentation

## 2023-08-14 DIAGNOSIS — H2512 Age-related nuclear cataract, left eye: Secondary | ICD-10-CM | POA: Diagnosis present

## 2023-08-14 HISTORY — DX: Presence of other vascular implants and grafts: Z95.828

## 2023-08-14 HISTORY — DX: Chronic diastolic (congestive) heart failure: I50.32

## 2023-08-14 HISTORY — DX: Hypothyroidism, unspecified: E03.9

## 2023-08-14 HISTORY — DX: Complete loss of teeth, unspecified cause, unspecified class: K08.109

## 2023-08-14 HISTORY — DX: Malignant neoplasm of thyroid gland: C73

## 2023-08-14 HISTORY — PX: CATARACT EXTRACTION W/PHACO: SHX586

## 2023-08-14 HISTORY — DX: Other ill-defined heart diseases: I51.89

## 2023-08-14 LAB — GLUCOSE, CAPILLARY: Glucose-Capillary: 83 mg/dL (ref 70–99)

## 2023-08-14 SURGERY — PHACOEMULSIFICATION, CATARACT, WITH IOL INSERTION
Anesthesia: Monitor Anesthesia Care | Site: Eye | Laterality: Left

## 2023-08-14 MED ORDER — MIDAZOLAM HCL 2 MG/2ML IJ SOLN
INTRAMUSCULAR | Status: DC | PRN
Start: 2023-08-14 — End: 2023-08-14
  Administered 2023-08-14: 2 mg via INTRAVENOUS

## 2023-08-14 MED ORDER — SIGHTPATH DOSE#1 BSS IO SOLN
INTRAOCULAR | Status: DC | PRN
  Administered 2023-08-14: 59 mL via OPHTHALMIC

## 2023-08-14 MED ORDER — TETRACAINE HCL 0.5 % OP SOLN
OPHTHALMIC | Status: AC
  Filled 2023-08-14: qty 4

## 2023-08-14 MED ORDER — BRIMONIDINE TARTRATE-TIMOLOL 0.2-0.5 % OP SOLN
OPHTHALMIC | Status: DC | PRN
  Administered 2023-08-14: 1 [drp] via OPHTHALMIC

## 2023-08-14 MED ORDER — ARMC OPHTHALMIC DILATING DROPS
1.0000 | OPHTHALMIC | Status: DC | PRN
  Administered 2023-08-14 (×3): 1 via OPHTHALMIC

## 2023-08-14 MED ORDER — TETRACAINE HCL 0.5 % OP SOLN
1.0000 [drp] | OPHTHALMIC | Status: DC | PRN
  Administered 2023-08-14 (×3): 1 [drp] via OPHTHALMIC

## 2023-08-14 MED ORDER — SIGHTPATH DOSE#1 NA HYALUR & NA CHOND-NA HYALUR IO KIT
PACK | INTRAOCULAR | Status: DC | PRN
  Administered 2023-08-14: 1 via OPHTHALMIC

## 2023-08-14 MED ORDER — MOXIFLOXACIN HCL 0.5 % OP SOLN
OPHTHALMIC | Status: DC | PRN
  Administered 2023-08-14: .2 mL via OPHTHALMIC

## 2023-08-14 MED ORDER — MIDAZOLAM HCL 2 MG/2ML IJ SOLN
INTRAMUSCULAR | Status: AC
  Filled 2023-08-14: qty 2

## 2023-08-14 MED ORDER — ARMC OPHTHALMIC DILATING DROPS
OPHTHALMIC | Status: AC
  Filled 2023-08-14: qty 0.5

## 2023-08-14 MED ORDER — LIDOCAINE HCL (PF) 2 % IJ SOLN
INTRAOCULAR | Status: DC | PRN
  Administered 2023-08-14: 1 mL via INTRAOCULAR

## 2023-08-14 SURGICAL SUPPLY — 13 items
CATARACT SUITE SIGHTPATH (MISCELLANEOUS) ×1 IMPLANT
DISSECTOR HYDRO NUCLEUS 50X22 (MISCELLANEOUS) ×1 IMPLANT
DRSG TEGADERM 2-3/8X2-3/4 SM (GAUZE/BANDAGES/DRESSINGS) ×1 IMPLANT
FEE CATARACT SUITE SIGHTPATH (MISCELLANEOUS) ×1 IMPLANT
GLOVE BIOGEL PI IND STRL 8 (GLOVE) ×1 IMPLANT
GLOVE SURG LX STRL 7.5 STRW (GLOVE) ×1 IMPLANT
GLOVE SURG PROTEXIS BL SZ6.5 (GLOVE) ×1 IMPLANT
GLOVE SURG SYN 6.5 PF PI BL (GLOVE) ×1 IMPLANT
LENS IOL DIOP 23.5 (Intraocular Lens) ×1 IMPLANT
LENS IOL TECNIS MONO 23.5 (Intraocular Lens) IMPLANT
NDL FILTER BLUNT 18X1 1/2 (NEEDLE) ×1 IMPLANT
NEEDLE FILTER BLUNT 18X1 1/2 (NEEDLE) ×1 IMPLANT
SYR 3ML LL SCALE MARK (SYRINGE) ×1 IMPLANT

## 2023-08-14 NOTE — Op Note (Signed)
 OPERATIVE NOTE  Rebecca Parker [id number] 08/14/2023   PREOPERATIVE DIAGNOSIS: Nuclear sclerotic cataract left eye. H25.12   POSTOPERATIVE DIAGNOSIS: Nuclear sclerotic cataract left eye. H25.12   PROCEDURE:  Phacoemusification with posterior chamber intraocular lens placement of the left eye  Ultrasound time: Procedure(s): PHACOEMULSIFICATION, CATARACT, WITH IOL INSERTION  3.93  00:31.6 (Left)  LENS:   Implant Name Type Inv. Item Serial No. Manufacturer Lot No. LRB No. Used Action  LENS IOL DIOP 23.5 - [id number] Intraocular Lens LENS IOL DIOP 23.5 [id number] [employer]  Left 1 Implanted      SURGEON:  Rosy Cooper. Donalda Fruit, MD   ANESTHESIA:  Topical with tetracaine drops, augmented with 1% preservative-free intracameral lidocaine.   COMPLICATIONS:  None.   DESCRIPTION OF PROCEDURE:  The patient was identified in the holding room and transported to the operating room and placed in the supine position under the operating microscope.  The left eye was identified as the operative eye, which was prepped and draped in the usual sterile ophthalmic fashion.   A 1 millimeter clear-corneal paracentesis was made inferotemporally. Preservative-free 1% lidocaine mixed with 1:1,000 bisulfite-free aqueous solution of epinephrine was injected into the anterior chamber. The anterior chamber was then filled with Viscoat viscoelastic. A 2.4 millimeter keratome was used to make a clear-corneal incision superotemporally. A curvilinear capsulorrhexis was made with a cystotome and capsulorrhexis forceps. Balanced salt solution was used to hydrodissect and hydrodelineate the nucleus. Phacoemulsification was then used to remove the lens nucleus and epinucleus. The remaining cortex was then removed using the irrigation and aspiration handpiece. Provisc was then placed into the capsular bag to distend it for lens placement. A +23.50 D [id number] intraocular lens was then injected into the capsular bag. The remaining  viscoelastic was aspirated.   Wounds were hydrated with balanced salt solution.  The anterior chamber was inflated to a physiologic pressure with balanced salt solution.  No wound leaks were noted. Moxifloxacin was injected intracamerally.  Timolol and Brimonidine drops were applied to the eye.  The patient was taken to the recovery room in stable condition without complications of anesthesia or surgery.  [name] 08/14/2023, 11:15 AM

## 2023-08-14 NOTE — Anesthesia Preprocedure Evaluation (Addendum)
 Anesthesia Evaluation  Patient identified by MRN, date of birth, ID band Patient awake    Reviewed: Allergy & Precautions, H&P , NPO status , Patient's Chart, lab work & pertinent test results  Airway Mallampati: III  TM Distance: <3 FB Neck ROM: Full    Dental no notable dental hx.    Pulmonary neg pulmonary ROS, Current Smoker   Pulmonary exam normal breath sounds clear to auscultation       Cardiovascular hypertension, +CHF  negative cardio ROS Normal cardiovascular exam Rhythm:Regular Rate:Normal  01-09-22  Cardiovascular: Heart is mildly enlarged. Aorta is normal in size. There are atherosclerotic calcifications of the aorta and coronary arteries. There is no pericardial effusion. Right chest port catheter tip ends in the right atrium.   12-16-13 INTERPRETATION  NORMAL LEFT VENTRICULAR SYSTOLIC FUNCTION WITH AN ESTIMATED EF = >55 %  NORMAL RIGHT VENTRICULAR SYSTOLIC FUNCTION  MILD MITRAL VALVE INSUFFICIENCY  NO VALVULAR STENOSIS  MILD LEFT ATRIUM ENLARGEMENT  DIASTOLIC DYSFUNCTION (GRADE 1)       Neuro/Psych  PSYCHIATRIC DISORDERS       Neuromuscular disease negative neurological ROS  negative psych ROS   GI/Hepatic negative GI ROS, Neg liver ROS,,,  Endo/Other  negative endocrine ROSdiabetesHypothyroidism    Renal/GU negative Renal ROS  negative genitourinary   Musculoskeletal negative musculoskeletal ROS (+)    Abdominal   Peds negative pediatric ROS (+)  Hematology negative hematology ROS (+) Blood dyscrasia, anemia   Anesthesia Other Findings Hx CHF and cocaine use---EKG done 08-14-23 Previous cataract surgery 08-14-23  NSR, anteroseptal infarct, age undetermined, patient denies chest pain, pressure, dyspnea, fatigue, nausea now or recently Chest pain, unspecified         Hip pain Hypertension             Multinodular goiter Diabetes mellitus without complication CHF (congestive heart  failure)  B12 deficiency        HLD (hyperlipidemia) Hyperplastic colon polyp      Tubular adenoma of colon Cervical cancer (HCC)  Thyroid  mass Cocaine use      Anemia Hypothyroidism             Edentulous   Reproductive/Obstetrics negative OB ROS                             Anesthesia Physical Anesthesia Plan  ASA: 3  Anesthesia Plan: MAC   Post-op Pain Management:    Induction: Intravenous  PONV Risk Score and Plan:   Airway Management Planned: Natural Airway and Nasal Cannula  Additional Equipment:   Intra-op Plan:   Post-operative Plan:   Informed Consent: I have reviewed the patients History and Physical, chart, labs and discussed the procedure including the risks, benefits and alternatives for the proposed anesthesia with the patient or authorized representative who has indicated his/her understanding and acceptance.     Dental Advisory Given  Plan Discussed with: Anesthesiologist, CRNA and Surgeon  Anesthesia Plan Comments: (Patient consented for risks of anesthesia including but not limited to:  - adverse reactions to medications - damage to eyes, teeth, lips or other oral mucosa - nerve damage due to positioning  - sore throat or hoarseness - Damage to heart, brain, nerves, lungs, other parts of body or loss of life  Patient voiced understanding and assent.)        Anesthesia Quick Evaluation

## 2023-08-14 NOTE — H&P (Signed)
 [facility]   Primary Care Physician:  [facility] Ophthalmologist: Dr. Deberah Pelton  Pre-Procedure History & Physical: HPI:  Rebecca Parker is a 73 y.o. female here for cataract surgery.   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, cN1, cM0); Tx'd with Cisplatin + EBRT   Chest pain, unspecified    CHF (congestive heart failure) (HCC)    Chronic diastolic heart failure, NYHA class 2 (HCC)    Cocaine use    none since Cancer dx (11/2021)   Diabetes mellitus without complication (HCC)    Edentulous    lost dentures   Grade I diastolic dysfunction    Hip pain    HLD (hyperlipidemia)    Hyperplastic colon polyp    Hypertension    Hypothyroidism    Multinodular goiter    Port-A-Cath in place    Thyroid cancer (HCC)    Thyroid mass    a.) 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)   Tubular adenoma of colon     Past Surgical History:  Procedure Laterality Date   ABDOMINAL HYSTERECTOMY     COLONOSCOPY WITH PROPOFOL N/A 11/24/2015   Procedure: COLONOSCOPY WITH PROPOFOL;  Surgeon: Christena Deem, MD;  Location: [facility] ENDOSCOPY;  Service: Endoscopy;  Laterality: N/A;   COLONOSCOPY WITH PROPOFOL N/A 03/18/2019   Procedure: COLONOSCOPY WITH PROPOFOL;  Surgeon: Pasty Spillers, MD;  Location: [facility] ENDOSCOPY;  Service: Endoscopy;  Laterality: N/A;   DILATION AND CURETTAGE OF UTERUS N/A 12/01/2020   Procedure: DILATATION AND CURETTAGE with cervical biopsies;  Surgeon: Schermerhorn, Ihor Austin, MD;  Location: [facility] ORS;  Service: Gynecology;  Laterality: N/A;   ESOPHAGOGASTRODUODENOSCOPY (EGD) WITH PROPOFOL N/A 03/18/2019   Procedure: ESOPHAGOGASTRODUODENOSCOPY (EGD) WITH PROPOFOL;  Surgeon: Pasty Spillers, MD;  Location: [facility] ENDOSCOPY;  Service: Endoscopy;  Laterality: N/A;   PORTA  CATH INSERTION N/A 01/16/2021   Procedure: PORTA CATH INSERTION;  Surgeon: Renford Dills, MD;  Location: [facility] INVASIVE CV LAB;  Service: Cardiovascular;  Laterality: N/A;   right ankle orif     THYROIDECTOMY, COMPLETION Bilateral 12/18/2021    Prior to Admission medications   Medication Sig Start Date End Date Taking? Authorizing Provider  aspirin EC 81 MG tablet Take 81 mg by mouth daily.   Yes [provider]  calcitRIOL (ROCALTROL) 0.25 MCG capsule Take 1 capsule (0.25 mcg total) by mouth in the morning and at bedtime for 14 days. 08/02/23 08/16/23 Yes Janith Lima, MD  Calcium Carbonate-Vitamin D 500-125 MG-UNIT TABS Take 1 tablet by mouth daily.   Yes [provider]  docusate sodium (COLACE) 100 MG capsule Take 100 mg by mouth daily.   Yes [provider]  Ferrous Sulfate (IRON) 325 (65 Fe) MG TABS Take 1 tablet by mouth 2 (two) times daily. 08/26/20  Yes [provider]  furosemide (LASIX) 20 MG tablet Take 20 mg by mouth 2 (two) times daily.   Yes [provider]  gabapentin (NEURONTIN) 800 MG tablet Take 800 mg by mouth 2 (two) times daily. 12/20/13  Yes [provider]  hydrOXYzine (ATARAX/VISTARIL) 25 MG tablet Take 25 mg by mouth daily. Take 25mg . By mouth every morning   Yes [provider]  lidocaine-prilocaine (EMLA) cream Apply 1 application topically as needed.   Yes [provider]  losartan (COZAAR) 50 MG tablet Take 50 mg by mouth 2 (two) times daily. 09/26/20  Yes [provider]  metFORMIN (GLUCOPHAGE) 500 MG tablet Take 500 mg by mouth 2 (two) times daily with a meal.   Yes [provider]  metroNIDAZOLE (METROGEL) 0.75 % vaginal gel Place 5 mg (1 applicator) in vagina at night every 3 days to prevent recurrent vaginal infection 09/03/21  Yes Alinda Dooms, NP  mirtazapine (REMERON) 15 MG tablet Take 15 mg by mouth at bedtime. 09/05/20  Yes [provider]  ondansetron  (ZOFRAN) 4 MG tablet Take 1 tablet (4 mg total) by mouth every 6 (six) hours as needed for nausea. 12/05/20  Yes Schermerhorn, Ihor Austin, MD  oxyCODONE (OXY IR/ROXICODONE) 5 MG immediate release tablet Take 1 tablet (5 mg total) by mouth every 8 (eight) hours as needed for moderate pain. 12/05/20  Yes Schermerhorn, Ihor Austin, MD  simvastatin (ZOCOR) 20 MG tablet Take 20 mg by mouth daily.   Yes [provider]  prochlorperazine (COMPAZINE) 10 MG tablet Take 1 tablet (10 mg total) by mouth every 6 (six) hours as needed (Nausea or vomiting). 01/03/21 01/03/21  Rickard Patience, MD    Allergies as of 07/29/2023 - Review Complete 03/12/2023  Allergen Reaction Noted   Oxybutynin Itching and Other (See Comments) 09/02/2011   Pregabalin Other (See Comments) 11/27/2015   Ace inhibitors Itching 11/23/2015   Cyclobenzaprine Itching 11/23/2015    Family History  Problem Relation Age of Onset   Breast cancer Maternal Aunt    Breast cancer Paternal Aunt     Social History   Socioeconomic History   Marital status: Legally Separated    Spouse name: Not on file   Number of children: Not on file   Years of education: Not on file   Highest education level: Not on file  Occupational History   Not on file  Tobacco Use   Smoking status: Every Day    Current packs/day: 0.10    Types: Cigarettes   Smokeless tobacco: Never  Vaping Use   Vaping status: Never Used  Substance and Sexual Activity   Alcohol use: Not Currently   Drug use: Yes    Types: Cocaine    Comment: some times   Sexual activity: Not Currently  Other Topics Concern   Not on file  Social History Narrative   Not on file   Social Drivers of Health   Financial Resource Strain: Not on file  Food Insecurity: Not on file  Transportation Needs: Not on file  Physical Activity: Not on file  Stress: Not on file  Social Connections: Not on file  Intimate Partner Violence: Not on file    Review of Systems: See HPI, otherwise negative  ROS  Physical Exam: Ht 5' 1.5" (1.562 m)   Wt 66.7 kg   BMI 27.33 kg/m  General:   Alert, cooperative in NAD Head:  Normocephalic and atraumatic. Respiratory:  Normal work of breathing. Cardiovascular:  RRR  Impression/Plan: MERIA CRILLY is here for cataract surgery.  Risks, benefits, limitations, and alternatives regarding cataract surgery have been reviewed with the patient.  Questions have been answered.  All parties agreeable.   Estanislado Pandy, MD  08/14/2023, 7:20 AM

## 2023-08-14 NOTE — Transfer of Care (Signed)
 Immediate Anesthesia Transfer of Care Note  Patient: Rebecca Parker  Procedure(s) Performed: PHACOEMULSIFICATION, CATARACT, WITH IOL INSERTION  3.93  00:31.6 (Left: Eye)  Patient Location: PACU  Anesthesia Type: MAC  Level of Consciousness: awake, alert  and patient cooperative  Airway and Oxygen Therapy: Patient Spontanous Breathing and Patient connected to supplemental oxygen  Post-op Assessment: Post-op Vital signs reviewed, Patient's Cardiovascular Status Stable, Respiratory Function Stable, Patent Airway and No signs of Nausea or vomiting  Post-op Vital Signs: Reviewed and stable  Complications: No notable events documented.

## 2023-08-14 NOTE — Anesthesia Postprocedure Evaluation (Signed)
 Anesthesia Post Note  Patient: Rebecca Parker  Procedure(s) Performed: PHACOEMULSIFICATION, CATARACT, WITH IOL INSERTION  3.93  00:31.6 (Left: Eye)  Patient location during evaluation: PACU Anesthesia Type: MAC Level of consciousness: awake and alert Pain management: pain level controlled Vital Signs Assessment: post-procedure vital signs reviewed and stable Respiratory status: spontaneous breathing, nonlabored ventilation, respiratory function stable and patient connected to nasal cannula oxygen Cardiovascular status: stable and blood pressure returned to baseline Postop Assessment: no apparent nausea or vomiting Anesthetic complications: no   No notable events documented.   Last Vitals:  Vitals:   08/14/23 1118 08/14/23 1123  BP: (!) 154/94 (!) 173/87  Pulse: 71 68  Resp: 12 18  Temp: (!) 36.2 C (!) 36.3 C  SpO2: 100% 100%    Last Pain:  Vitals:   08/14/23 1123  PainSc: 0-No pain                 Raneisha Bress C Krisna Omar

## 2023-08-18 ENCOUNTER — Encounter: Payer: Self-pay | Admitting: Ophthalmology

## 2023-08-26 NOTE — Discharge Instructions (Signed)

## 2023-08-28 ENCOUNTER — Other Ambulatory Visit: Payer: Self-pay

## 2023-08-28 ENCOUNTER — Ambulatory Visit: Payer: Self-pay | Admitting: Anesthesiology

## 2023-08-28 ENCOUNTER — Ambulatory Visit
Admission: RE | Admit: 2023-08-28 | Discharge: 2023-08-28 | Disposition: A | Discharge status: Home / Self Care | Attending: Ophthalmology | Admitting: Ophthalmology

## 2023-08-28 ENCOUNTER — Encounter
Admission: RE | Disposition: A | Discharge status: Home / Self Care | Payer: Self-pay | Source: Home / Self Care | Attending: Ophthalmology

## 2023-08-28 ENCOUNTER — Encounter: Payer: Self-pay | Admitting: Ophthalmology

## 2023-08-28 DIAGNOSIS — Z9842 Cataract extraction status, left eye: Secondary | ICD-10-CM | POA: Diagnosis not present

## 2023-08-28 DIAGNOSIS — F17210 Nicotine dependence, cigarettes, uncomplicated: Secondary | ICD-10-CM | POA: Insufficient documentation

## 2023-08-28 DIAGNOSIS — I110 Hypertensive heart disease with heart failure: Secondary | ICD-10-CM | POA: Diagnosis not present

## 2023-08-28 DIAGNOSIS — Z7984 Long term (current) use of oral hypoglycemic drugs: Secondary | ICD-10-CM | POA: Insufficient documentation

## 2023-08-28 DIAGNOSIS — E1136 Type 2 diabetes mellitus with diabetic cataract: Secondary | ICD-10-CM | POA: Diagnosis present

## 2023-08-28 DIAGNOSIS — H2511 Age-related nuclear cataract, right eye: Secondary | ICD-10-CM | POA: Diagnosis not present

## 2023-08-28 DIAGNOSIS — Z961 Presence of intraocular lens: Secondary | ICD-10-CM | POA: Insufficient documentation

## 2023-08-28 DIAGNOSIS — D649 Anemia, unspecified: Secondary | ICD-10-CM | POA: Insufficient documentation

## 2023-08-28 DIAGNOSIS — I5032 Chronic diastolic (congestive) heart failure: Secondary | ICD-10-CM | POA: Diagnosis not present

## 2023-08-28 HISTORY — PX: CATARACT EXTRACTION W/PHACO: SHX586

## 2023-08-28 LAB — GLUCOSE, CAPILLARY: Glucose-Capillary: 92 mg/dL (ref 70–99)

## 2023-08-28 SURGERY — PHACOEMULSIFICATION, CATARACT, WITH IOL INSERTION
Anesthesia: Monitor Anesthesia Care | Site: Eye | Laterality: Right

## 2023-08-28 MED ORDER — FENTANYL CITRATE (PF) 100 MCG/2ML IJ SOLN
INTRAMUSCULAR | Status: DC | PRN
  Administered 2023-08-28: 50 ug via INTRAVENOUS

## 2023-08-28 MED ORDER — TETRACAINE HCL 0.5 % OP SOLN
OPHTHALMIC | Status: AC
  Filled 2023-08-28: qty 4

## 2023-08-28 MED ORDER — SIGHTPATH DOSE#1 BSS IO SOLN
INTRAOCULAR | Status: DC | PRN
  Administered 2023-08-28: 15 mL via INTRAOCULAR

## 2023-08-28 MED ORDER — SIGHTPATH DOSE#1 NA HYALUR & NA CHOND-NA HYALUR IO KIT
PACK | INTRAOCULAR | Status: DC | PRN
  Administered 2023-08-28: 1 via OPHTHALMIC

## 2023-08-28 MED ORDER — TETRACAINE HCL 0.5 % OP SOLN
1.0000 [drp] | OPHTHALMIC | Status: DC | PRN
  Administered 2023-08-28 (×3): 1 [drp] via OPHTHALMIC

## 2023-08-28 MED ORDER — MIDAZOLAM HCL 2 MG/2ML IJ SOLN
INTRAMUSCULAR | Status: AC
  Filled 2023-08-28: qty 2

## 2023-08-28 MED ORDER — MOXIFLOXACIN HCL 0.5 % OP SOLN
OPHTHALMIC | Status: DC | PRN
  Administered 2023-08-28: .2 mL via OPHTHALMIC

## 2023-08-28 MED ORDER — LIDOCAINE HCL (PF) 2 % IJ SOLN
INTRAOCULAR | Status: DC | PRN
  Administered 2023-08-28: 4 mL via INTRAOCULAR

## 2023-08-28 MED ORDER — MIDAZOLAM HCL 2 MG/2ML IJ SOLN
INTRAMUSCULAR | Status: DC | PRN
  Administered 2023-08-28: 2 mg via INTRAVENOUS

## 2023-08-28 MED ORDER — FENTANYL CITRATE (PF) 100 MCG/2ML IJ SOLN
INTRAMUSCULAR | Status: AC
  Filled 2023-08-28: qty 2

## 2023-08-28 MED ORDER — BRIMONIDINE TARTRATE-TIMOLOL 0.2-0.5 % OP SOLN
OPHTHALMIC | Status: DC | PRN
Start: 2023-08-28 — End: 2023-08-28
  Administered 2023-08-28: 1 [drp] via OPHTHALMIC

## 2023-08-28 MED ORDER — SIGHTPATH DOSE#1 BSS IO SOLN: INTRAOCULAR | Status: DC | PRN

## 2023-08-28 MED ORDER — ARMC OPHTHALMIC DILATING DROPS
OPHTHALMIC | Status: AC
  Filled 2023-08-28: qty 0.5

## 2023-08-28 MED ORDER — ARMC OPHTHALMIC DILATING DROPS
1.0000 | OPHTHALMIC | Status: DC | PRN
  Administered 2023-08-28 (×3): 1 via OPHTHALMIC

## 2023-08-28 SURGICAL SUPPLY — 14 items
CATARACT SUITE SIGHTPATH (MISCELLANEOUS) ×1 IMPLANT
DISSECTOR HYDRO NUCLEUS 50X22 (MISCELLANEOUS) ×1 IMPLANT
DRSG TEGADERM 2-3/8X2-3/4 SM (GAUZE/BANDAGES/DRESSINGS) ×1 IMPLANT
FEE CATARACT SUITE SIGHTPATH (MISCELLANEOUS) ×1 IMPLANT
GLOVE BIOGEL PI IND STRL 8 (GLOVE) ×1 IMPLANT
GLOVE SURG LX STRL 7.5 STRW (GLOVE) ×1 IMPLANT
GLOVE SURG PROTEXIS BL SZ6.5 (GLOVE) ×1 IMPLANT
GLOVE SURG SYN 6.5 PF PI BL (GLOVE) ×1 IMPLANT
KNIFE OPTIMUM SIDEPORT 15DEG (MISCELLANEOUS) IMPLANT
LENS IOL DIOP 24.5 ×1 IMPLANT
LENS IOL TECNIS MONO 24.5 IMPLANT
NDL FILTER BLUNT 18X1 1/2 (NEEDLE) ×1 IMPLANT
NEEDLE FILTER BLUNT 18X1 1/2 (NEEDLE) ×1 IMPLANT
SYR 3ML LL SCALE MARK (SYRINGE) ×1 IMPLANT

## 2023-08-28 NOTE — Anesthesia Postprocedure Evaluation (Signed)
 Anesthesia Post Note  Patient: Rebecca Parker  Procedure(s) Performed: PHACOEMULSIFICATION, CATARACT, WITH IOL INSERTION 2.67 00:21.9 (Right: Eye)  Patient location during evaluation: PACU Anesthesia Type: MAC Level of consciousness: awake and alert Pain management: pain level controlled Vital Signs Assessment: post-procedure vital signs reviewed and stable Respiratory status: spontaneous breathing, nonlabored ventilation, respiratory function stable and patient connected to nasal cannula oxygen Cardiovascular status: stable and blood pressure returned to baseline Postop Assessment: no apparent nausea or vomiting Anesthetic complications: no   No notable events documented.   Last Vitals:  Vitals:   08/28/23 1022 08/28/23 1028  BP: (!) 142/79 136/85  Pulse: 72 72  Resp: 20 15  Temp: (!) 36.1 C   SpO2: 100% 100%    Last Pain:  Vitals:   08/28/23 1028  TempSrc:   PainSc: 0-No pain                 Ronne Stefanski C January Bergthold

## 2023-08-28 NOTE — H&P (Signed)
 [facility]   Primary Care Physician:  [facility] Ophthalmologist: Dr. Meg Spina  Pre-Procedure History & Physical: HPI:  Rebecca Parker is a 73 y.o. female here for cataract surgery.   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, cN1, cM0); Tx'd with Cisplatin  + EBRT   Chest pain, unspecified    CHF (congestive heart failure) (HCC)    Chronic diastolic heart failure, NYHA class 2 (HCC)    Cocaine use    none since Cancer dx (11/2021)   Diabetes mellitus without complication (HCC)    Edentulous    lost dentures   Grade I diastolic dysfunction    Hip pain    HLD (hyperlipidemia)    Hyperplastic colon polyp    Hypertension    Hypothyroidism    Multinodular goiter    Port-A-Cath in place    Thyroid  cancer (HCC)    Thyroid  mass    a.) 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)   Tubular adenoma of colon     Past Surgical History:  Procedure Laterality Date   ABDOMINAL HYSTERECTOMY     CATARACT EXTRACTION W/PHACO Left 08/14/2023   Procedure: PHACOEMULSIFICATION, CATARACT, WITH IOL INSERTION  3.93  00:31.6;  Surgeon: Trudi Fus, MD;  Location: [facility];  Service: Ophthalmology;  Laterality: Left;   COLONOSCOPY WITH PROPOFOL  N/A 11/24/2015   Procedure: COLONOSCOPY WITH PROPOFOL ;  Surgeon: Deveron Fly, MD;  Location: [facility] ENDOSCOPY;  Service: Endoscopy;  Laterality: N/A;   COLONOSCOPY WITH PROPOFOL  N/A 03/18/2019   Procedure: COLONOSCOPY WITH PROPOFOL ;  Surgeon: Irby Mannan, MD;  Location: [facility] ENDOSCOPY;  Service: Endoscopy;  Laterality: N/A;   DILATION AND CURETTAGE OF UTERUS N/A 12/01/2020   Procedure: DILATATION AND CURETTAGE with cervical biopsies;  Surgeon: Schermerhorn, Joselyn Nicely, MD;  Location: [facility] ORS;  Service: Gynecology;  Laterality: N/A;    ESOPHAGOGASTRODUODENOSCOPY (EGD) WITH PROPOFOL  N/A 03/18/2019   Procedure: ESOPHAGOGASTRODUODENOSCOPY (EGD) WITH PROPOFOL ;  Surgeon: Irby Mannan, MD;  Location: [facility] ENDOSCOPY;  Service: Endoscopy;  Laterality: N/A;   PORTA CATH INSERTION N/A 01/16/2021   Procedure: PORTA CATH INSERTION;  Surgeon: Jackquelyn Mass, MD;  Location: [facility] INVASIVE CV LAB;  Service: Cardiovascular;  Laterality: N/A;   right ankle orif     THYROIDECTOMY, COMPLETION Bilateral 12/18/2021    Prior to Admission medications   Medication Sig Start Date End Date Taking? Authorizing Provider  aspirin EC 81 MG tablet Take 81 mg by mouth daily.   Yes [provider]  Calcium  Carbonate-Vitamin D 500-125 MG-UNIT TABS Take 1 tablet by mouth daily.   Yes [provider]  docusate sodium  (COLACE) 100 MG capsule Take 100 mg by mouth daily.   Yes [provider]  Ferrous Sulfate  (IRON) 325 (65 Fe) MG TABS Take 1 tablet by mouth 2 (two) times daily. 08/26/20  Yes [provider]  furosemide (LASIX) 20 MG tablet Take 20 mg by mouth 2 (two) times daily.   Yes [provider]  gabapentin (NEURONTIN) 800 MG tablet Take 800 mg by mouth 2 (two) times daily. 12/20/13  Yes [provider]  hydrOXYzine  (ATARAX /VISTARIL ) 25 MG tablet Take 25 mg by mouth daily. Take 25mg . By mouth every morning   Yes [provider]  lidocaine -prilocaine  (EMLA ) cream Apply 1 application topically as  needed.   Yes [provider]  losartan (COZAAR) 50 MG tablet Take 50 mg by mouth 2 (two) times daily. 09/26/20  Yes [provider]  metFORMIN (GLUCOPHAGE) 500 MG tablet Take 500 mg by mouth 2 (two) times daily with a meal.   Yes [provider]  metroNIDAZOLE  (METROGEL ) 0.75 % vaginal gel Place 5 mg (1 applicator) in vagina at night every 3 days to prevent recurrent vaginal infection 09/03/21  Yes Nelda Balsam, NP  mirtazapine (REMERON) 15 MG tablet Take 15 mg by  mouth at bedtime. 09/05/20  Yes [provider]  ondansetron  (ZOFRAN ) 4 MG tablet Take 1 tablet (4 mg total) by mouth every 6 (six) hours as needed for nausea. 12/05/20  Yes Schermerhorn, Joselyn Nicely, MD  oxyCODONE  (OXY IR/ROXICODONE ) 5 MG immediate release tablet Take 1 tablet (5 mg total) by mouth every 8 (eight) hours as needed for moderate pain. 12/05/20  Yes Schermerhorn, Joselyn Nicely, MD  simvastatin  (ZOCOR ) 20 MG tablet Take 20 mg by mouth daily.   Yes [provider]  prochlorperazine  (COMPAZINE ) 10 MG tablet Take 1 tablet (10 mg total) by mouth every 6 (six) hours as needed (Nausea or vomiting). 01/03/21 01/03/21  Timmy Forbes, MD    Allergies as of 07/29/2023 - Review Complete 03/12/2023  Allergen Reaction Noted   Oxybutynin  Itching and Other (See Comments) 09/02/2011   Pregabalin  Other (See Comments) 11/27/2015   Ace inhibitors Itching 11/23/2015   Cyclobenzaprine Itching 11/23/2015    Family History  Problem Relation Age of Onset   Breast cancer Maternal Aunt    Breast cancer Paternal Aunt     Social History   Socioeconomic History   Marital status: Legally Separated    Spouse name: Not on file   Number of children: Not on file   Years of education: Not on file   Highest education level: Not on file  Occupational History   Not on file  Tobacco Use   Smoking status: Every Day    Current packs/day: 0.10    Types: Cigarettes   Smokeless tobacco: Never  Vaping Use   Vaping status: Never Used  Substance and Sexual Activity   Alcohol use: Not Currently   Drug use: Yes    Types: Cocaine    Comment: some times   Sexual activity: Not Currently  Other Topics Concern   Not on file  Social History Narrative   Not on file   Social Drivers of Health   Financial Resource Parker: Not on file  Food Insecurity: Not on file  Transportation Needs: Not on file  Physical Activity: Not on file  Stress: Not on file  Social Connections: Not on file  Intimate Partner  Violence: Not on file    Review of Systems: See HPI, otherwise negative ROS  Physical Exam: There were no vitals taken for this visit. General:   Alert, cooperative in NAD Head:  Normocephalic and atraumatic. Respiratory:  Normal work of breathing. Cardiovascular:  RRR  Impression/Plan: Rebecca Parker is here for cataract surgery.  Risks, benefits, limitations, and alternatives regarding cataract surgery have been reviewed with the patient.  Questions have been answered.  All parties agreeable.   Trudi Fus, MD  08/28/2023, 7:12 AM

## 2023-08-28 NOTE — Op Note (Signed)
 OPERATIVE NOTE  TARYNN CONTRERA [id number] 08/28/2023   PREOPERATIVE DIAGNOSIS: Nuclear sclerotic cataract right eye. H25.11   POSTOPERATIVE DIAGNOSIS: Nuclear sclerotic cataract right eye. H25.11   PROCEDURE:  Phacoemusification with posterior chamber intraocular lens placement of the right eye  Ultrasound time: Procedure(s): PHACOEMULSIFICATION, CATARACT, WITH IOL INSERTION 2.67 00:21.9 (Right)  LENS:   Implant Name Type Inv. Item Serial No. Manufacturer Lot No. LRB No. Used Action  LENS IOL DIOP 24.5 - [id number]  LENS IOL DIOP 24.5 [id number] [employer]  Right 1 Implanted      SURGEON:  Rosy Cooper. Donalda Fruit, MD   ANESTHESIA:  Topical with tetracaine  drops, augmented with 1% preservative-free intracameral lidocaine .   COMPLICATIONS:  None.   DESCRIPTION OF PROCEDURE:  The patient was identified in the holding room and transported to the operating room and placed in the supine position under the operating microscope.  The right eye was identified as the operative eye, which was prepped and draped in the usual sterile ophthalmic fashion.   A 1 millimeter clear-corneal paracentesis was made superotemporally. Preservative-free 1% lidocaine  mixed with 1:1,000 bisulfite-free aqueous solution of epinephrine  was injected into the anterior chamber. The anterior chamber was then filled with Viscoat viscoelastic. A 2.4 millimeter keratome was used to make a clear-corneal incision inferotemporally. A curvilinear capsulorrhexis was made with a cystotome and capsulorrhexis forceps. Balanced salt  solution was used to hydrodissect and hydrodelineate the nucleus. Phacoemulsification was then used to remove the lens nucleus and epinucleus. The remaining cortex was then removed using the irrigation and aspiration handpiece. Provisc was then placed into the capsular bag to distend it for lens placement. A +24.50 D [id number] intraocular lens was then injected into the capsular bag. The remaining viscoelastic was  aspirated.   Wounds were hydrated with balanced salt  solution.  The anterior chamber was inflated to a physiologic pressure with balanced salt  solution.  No wound leaks were noted. Moxifloxacin  was injected intracamerally.  Timolol  and Brimonidine  drops were applied to the eye.  The patient was taken to the recovery room in stable condition without complications of anesthesia or surgery.  [employer] 08/28/2023, 10:21 AM

## 2023-08-28 NOTE — Transfer of Care (Signed)
 Immediate Anesthesia Transfer of Care Note  Patient: Rebecca Parker  Procedure(s) Performed: PHACOEMULSIFICATION, CATARACT, WITH IOL INSERTION 2.67 00:21.9 (Right: Eye)  Patient Location: PACU  Anesthesia Type: MAC  Level of Consciousness: awake, alert  and patient cooperative  Airway and Oxygen Therapy: Patient Spontanous Breathing and Patient connected to supplemental oxygen  Post-op Assessment: Post-op Vital signs reviewed, Patient's Cardiovascular Status Stable, Respiratory Function Stable, Patent Airway and No signs of Nausea or vomiting  Post-op Vital Signs: Reviewed and stable  Complications: No notable events documented.

## 2023-09-10 ENCOUNTER — Encounter: Payer: Self-pay | Admitting: Nurse Practitioner

## 2023-09-10 ENCOUNTER — Ambulatory Visit (INDEPENDENT_AMBULATORY_CARE_PROVIDER_SITE_OTHER): Payer: 59 | Admitting: Nurse Practitioner

## 2023-09-10 VITALS — BP 137/89 | HR 80 | Temp 98.4°F | Resp 17 | Ht 60.32 in | Wt 144.0 lb

## 2023-09-10 DIAGNOSIS — I10 Essential (primary) hypertension: Secondary | ICD-10-CM

## 2023-09-10 DIAGNOSIS — F1410 Cocaine abuse, uncomplicated: Secondary | ICD-10-CM

## 2023-09-10 DIAGNOSIS — N1832 Chronic kidney disease, stage 3b: Secondary | ICD-10-CM | POA: Diagnosis not present

## 2023-09-10 DIAGNOSIS — E118 Type 2 diabetes mellitus with unspecified complications: Secondary | ICD-10-CM

## 2023-09-10 DIAGNOSIS — F17200 Nicotine dependence, unspecified, uncomplicated: Secondary | ICD-10-CM

## 2023-09-10 DIAGNOSIS — I5032 Chronic diastolic (congestive) heart failure: Secondary | ICD-10-CM

## 2023-09-10 DIAGNOSIS — C539 Malignant neoplasm of cervix uteri, unspecified: Secondary | ICD-10-CM | POA: Diagnosis not present

## 2023-09-10 DIAGNOSIS — Z1159 Encounter for screening for other viral diseases: Secondary | ICD-10-CM

## 2023-09-10 DIAGNOSIS — Z7689 Persons encountering health services in other specified circumstances: Secondary | ICD-10-CM

## 2023-09-10 DIAGNOSIS — E119 Type 2 diabetes mellitus without complications: Secondary | ICD-10-CM | POA: Insufficient documentation

## 2023-09-10 DIAGNOSIS — Z7984 Long term (current) use of oral hypoglycemic drugs: Secondary | ICD-10-CM

## 2023-09-10 DIAGNOSIS — E782 Mixed hyperlipidemia: Secondary | ICD-10-CM

## 2023-09-10 LAB — MICROALBUMIN, URINE WAIVED
Creatinine, Urine Waived: 200 mg/dL (ref 10–300)
Microalb, Ur Waived: 150 mg/L — ABNORMAL HIGH (ref 0–19)

## 2023-09-10 NOTE — Assessment & Plan Note (Signed)
 Chronic.  Endorses taking Metformin 500mg  BID.  Unsure of when last A1c was done.  Microalbumin checked.  Does see [facility]- will request eye exam.  Labs ordered today. Will follow up in 1 month.  Call sooner if concerns aries.

## 2023-09-10 NOTE — Assessment & Plan Note (Signed)
 Current active user.  States she uses about 1x per week.

## 2023-09-10 NOTE — Assessment & Plan Note (Signed)
 Chronic. Not currently seeing Cardiology.  Last saw Dr. Bary Likes in 2021.  No recent ECHO imaging available to review in EPIC.

## 2023-09-10 NOTE — Progress Notes (Signed)
 BP 137/89 (BP Location: Left Arm, Patient Position: Sitting, Cuff Size: Normal)   Pulse 80   Temp 98.4 F (36.9 C) (Oral)   Resp 17   Ht 5' 0.32" (1.532 m)   Wt 144 lb (65.3 kg)   SpO2 99%   BMI 27.83 kg/m    Subjective:    Patient ID: Rebecca Parker, female    DOB: 1950-05-19, 73 y.o.   MRN: [id number]  HPI: Rebecca Parker is a 73 y.o. female  Chief Complaint  Patient presents with   Establish Care   Patient presents to clinic to establish care with new PCP.  Introduced to Publishing rights manager role and practice setting.  All questions answered.  Discussed provider/patient relationship and expectations.  Patient reports a history of Hypertension, Diabetes Type 2, CHF, HTN, HLD, invasive carcinoma of cervix, ovarian cancer and thyroid  cancer.  [facility] was previously seen [facility].  She had her thyroid  removed.  She hasn't seen Oncology since November.  She was supposed to follow up in February but hasn't seen them recently- hasn't been seen since 2021. Current everyday smoker.  She does current smoke cocaine.  Patient is a poor historian.  Unsure when she last saw Oncology, Gynecology, or Endocrinology.       Patient denies a history of: Depression, Anxiety, Neurological problems, and Abdominal problems.    Active Ambulatory Problems    Diagnosis Date Noted   Benign essential hypertension 11/18/2014   Chronic diastolic CHF (congestive heart failure), NYHA class 2 (HCC) 05/17/2014   Constipation 01/29/2013   Edema 05/29/2011   Eosinophil count raised 10/20/2012   Insomnia 07/27/2009   Mixed hyperlipidemia 05/17/2014   Mixed incontinence 10/24/2015   Neck pain 06/15/2012   Vitamin D deficiency 07/31/2009   Venous insufficiency of both lower extremities 11/24/2014   Urinary incontinence 10/19/2012   Controlled diabetes mellitus type 2 with complications (HCC) 07/24/2012   Tobacco dependence 02/10/2014   Thyroid  nodule 02/10/2014   Spinal stenosis, lumbar region without  neurogenic claudication 03/05/2013   Polyneuropathy 11/15/2011   Palmar fascial fibromatosis 09/13/2011   Sepsis (HCC) 12/01/2020   Ovarian mass, right 12/01/2020   Acute lower UTI 12/01/2020   Pelvic pain 12/01/2020   Normocytic anemia 12/23/2020   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HCC) 12/23/2020   Goals of care, counseling/discussion 12/23/2020   Cocaine abuse (HCC) 12/23/2020   Tobacco use 01/03/2021   Encounter for antineoplastic chemotherapy 01/03/2021   Vitamin B12 deficiency 01/03/2021   B12 deficiency anemia 01/03/2021   Port-A-Cath in place 01/17/2021   Abnormal positron emission tomography (PET) scan 01/17/2021   Stage 3b chronic kidney disease (HCC) 09/10/2023   Diabetes mellitus treated with oral medication (HCC) 09/10/2023   Resolved Ambulatory Problems    Diagnosis Date Noted   Morbid (severe) obesity due to excess calories (HCC) 11/15/2015   Past Medical History:  Diagnosis Date   Anemia    B12 deficiency 01/03/2021   Cervical cancer (HCC)    Chest pain, unspecified    CHF (congestive heart failure) (HCC)    Chronic diastolic heart failure, NYHA class 2 (HCC)    Cocaine use    Diabetes mellitus without complication (HCC)    Edentulous    Grade I diastolic dysfunction    Hip pain    HLD (hyperlipidemia)    Hyperplastic colon polyp    Hypertension    Hypothyroidism    Multinodular goiter    Thyroid  cancer (HCC)    Thyroid  mass  Tubular adenoma of colon    Past Surgical History:  Procedure Laterality Date   ABDOMINAL HYSTERECTOMY     CATARACT EXTRACTION W/PHACO Left 08/14/2023   Procedure: PHACOEMULSIFICATION, CATARACT, WITH IOL INSERTION  3.93  00:31.6;  Surgeon: Trudi Fus, MD;  Location: [facility]GERY [facility];  Service: Ophthalmology;  Laterality: Left;   CATARACT EXTRACTION W/PHACO Right 08/28/2023   Procedure: PHACOEMULSIFICATION, CATARACT, WITH IOL INSERTION 2.67 00:21.9;  Surgeon: Trudi Fus, MD;  Location: [facility]GERY [facility];   Service: Ophthalmology;  Laterality: Right;   COLONOSCOPY WITH PROPOFOL  N/A 11/24/2015   Procedure: COLONOSCOPY WITH PROPOFOL ;  Surgeon: Deveron Fly, MD;  Location: [facility] ENDOSCOPY;  Service: Endoscopy;  Laterality: N/A;   COLONOSCOPY WITH PROPOFOL  N/A 03/18/2019   Procedure: COLONOSCOPY WITH PROPOFOL ;  Surgeon: Irby Mannan, MD;  Location: [facility] ENDOSCOPY;  Service: Endoscopy;  Laterality: N/A;   DILATION AND CURETTAGE OF UTERUS N/A 12/01/2020   Procedure: DILATATION AND CURETTAGE with cervical biopsies;  Surgeon: Schermerhorn, Joselyn Nicely, MD;  Location: [facility] ORS;  Service: Gynecology;  Laterality: N/A;   ESOPHAGOGASTRODUODENOSCOPY (EGD) WITH PROPOFOL  N/A 03/18/2019   Procedure: ESOPHAGOGASTRODUODENOSCOPY (EGD) WITH PROPOFOL ;  Surgeon: Irby Mannan, MD;  Location: [facility] ENDOSCOPY;  Service: Endoscopy;  Laterality: N/A;   PORTA CATH INSERTION N/A 01/16/2021   Procedure: PORTA CATH INSERTION;  Surgeon: Jackquelyn Mass, MD;  Location: [facility] INVASIVE CV LAB;  Service: Cardiovascular;  Laterality: N/A;   right ankle orif     THYROIDECTOMY, COMPLETION Bilateral 12/18/2021   Family History  Problem Relation Age of Onset   Breast cancer Maternal Aunt    Breast cancer Paternal Aunt      Review of Systems  All other systems reviewed and are negative.   Per HPI unless specifically indicated above     Objective:     BP 137/89 (BP Location: Left Arm, Patient Position: Sitting, Cuff Size: Normal)   Pulse 80   Temp 98.4 F (36.9 C) (Oral)   Resp 17   Ht 5' 0.32" (1.532 m)   Wt 144 lb (65.3 kg)   SpO2 99%   BMI 27.83 kg/m   Wt Readings from Last 3 Encounters:  09/10/23 144 lb (65.3 kg)  08/28/23 142 lb 6.4 oz (64.6 kg)  08/14/23 149 lb (67.6 kg)    Physical Exam Vitals and nursing note reviewed.  Constitutional:      General: She is not in acute distress.    Appearance: Normal appearance. She is normal weight. She is not ill-appearing, toxic-appearing or  diaphoretic.  HENT:     Head: Normocephalic.     Right Ear: External ear normal.     Left Ear: External ear normal.     Nose: Nose normal.     Mouth/Throat:     Mouth: Mucous membranes are moist.     Pharynx: Oropharynx is clear.  Eyes:     General:        Right eye: No discharge.        Left eye: No discharge.     Extraocular Movements: Extraocular movements intact.     Conjunctiva/sclera: Conjunctivae normal.     Pupils: Pupils are equal, round, and reactive to light.  Cardiovascular:     Rate and Rhythm: Normal rate and regular rhythm.     Heart sounds: No murmur heard. Pulmonary:     Effort: Pulmonary effort is normal. No respiratory distress.     Breath sounds: Normal breath sounds. No wheezing or rales.  Musculoskeletal:  Cervical back: Normal range of motion and neck supple.  Skin:    General: Skin is warm and dry.     Capillary Refill: Capillary refill takes less than 2 seconds.  Neurological:     General: No focal deficit present.     Mental Status: She is alert and oriented to person, place, and time. Mental status is at baseline.  Psychiatric:        Mood and Affect: Mood normal.        Behavior: Behavior normal.        Thought Content: Thought content normal.        Judgment: Judgment normal.     Results for orders placed or performed during the hospital encounter of 08/28/23  Glucose, capillary   Collection Time: 08/28/23  8:56 AM  Result Value Ref Range   Glucose-Capillary 92 70 - 99 mg/dL      Assessment & Plan:   Problem List Items Addressed This Visit       Cardiovascular and Mediastinum   Benign essential hypertension   Chronic.  Controlled.  Continue with current medication regimen of Losartan 50mg .  Labs ordered today.  Return to clinic in 1 month for reevaluation.  Call sooner if concerns arise.        Relevant Orders   Comp Met (CMET)   Chronic diastolic CHF (congestive heart failure), NYHA class 2 (HCC) - Primary   Chronic. Not  currently seeing Cardiology.  Last saw Dr. Bary Likes in 2021.  No recent ECHO imaging available to review in EPIC.      Relevant Orders   Ambulatory referral to Cardiology     Endocrine   Controlled diabetes mellitus type 2 with complications (HCC)   Chronic.  Endorses taking Metformin 500mg  BID.  Unsure of when last A1c was done.  Microalbumin checked.  Does see [facility]- will request eye exam.  Labs ordered today. Will follow up in 1 month.  Call sooner if concerns aries.       Relevant Orders   HgB A1c   Microalbumin, Urine Waived   Diabetes mellitus treated with oral medication (HCC)     Genitourinary   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HCC)   Patient is very poor historian.  Unsure of last time she was seen by GYN- patient is unsure of who her GYN is. She does see Dr[facility] Wilhel[facility]ia [facility]ada at [facility].  Has not been seen since November. Per last note was due to be seen in February. Encouraged patient to make an appt.      Relevant Orders   CBC w/Diff   Stage 3b chronic kidney disease (HCC)     Other   Mixed hyperlipidemia   Labs ordered at visit today.  Will make recommendations based on lab results.  Continue with Simvastatin .        Relevant Orders   Lipid Profile   Tobacco dependence   Current everyday smoker.       Cocaine abuse (HCC)   Current active user.  States she uses about 1x per week.      Other Visit Diagnoses       Encounter for hepatitis C screening test for low risk patient       Relevant Orders   Hepatitis C Antibody     Encounter to establish care            Follow up plan: Return in about 1 month (around 10/11/2023) for Physical and Fasting labs, MWV.

## 2023-09-10 NOTE — Assessment & Plan Note (Signed)
 Patient is very poor historian.  Unsure of last time she was seen by GYN- patient is unsure of who her GYN is. She does see Dr. Wilhelmenia Harada at [facility].  Has not been seen since November. Per last note was due to be seen in February. Encouraged patient to make an appt.

## 2023-09-10 NOTE — Assessment & Plan Note (Signed)
-  Current everyday smoker

## 2023-09-10 NOTE — Assessment & Plan Note (Addendum)
Chronic.  Controlled.  Continue with current medication regimen of Losartan 50mg .  Labs ordered today.  Return to clinic in 1 month for reevaluation.  Call sooner if concerns arise.

## 2023-09-10 NOTE — Assessment & Plan Note (Signed)
 Labs ordered at visit today.  Will make recommendations based on lab results.  Continue with Simvastatin .

## 2023-09-11 ENCOUNTER — Ambulatory Visit: Payer: Self-pay | Admitting: Nurse Practitioner

## 2023-09-11 LAB — COMPREHENSIVE METABOLIC PANEL WITH GFR
ALT: 11 IU/L (ref 0–32)
AST: 16 IU/L (ref 0–40)
Albumin: 4.1 g/dL (ref 3.8–4.8)
Alkaline Phosphatase: 78 IU/L (ref 44–121)
BUN/Creatinine Ratio: 10 — ABNORMAL LOW (ref 12–28)
BUN: 18 mg/dL (ref 8–27)
Bilirubin Total: <0.2 mg/dL (ref 0.0–1.2)
CO2: 21 mmol/L (ref 20–29)
Calcium: 9 mg/dL (ref 8.7–10.3)
Chloride: 104 mmol/L (ref 96–106)
Creatinine, Ser: 1.84 mg/dL — ABNORMAL HIGH (ref 0.57–1.00)
Globulin, Total: 2.2 g/dL (ref 1.5–4.5)
Glucose: 87 mg/dL (ref 70–99)
Potassium: 4.9 mmol/L (ref 3.5–5.2)
Sodium: 143 mmol/L (ref 134–144)
Total Protein: 6.3 g/dL (ref 6.0–8.5)
eGFR: 28 mL/min/{1.73_m2} — ABNORMAL LOW (ref >59)

## 2023-09-11 LAB — LIPID PANEL
Chol/HDL Ratio: 3 ratio (ref 0.0–4.4)
Cholesterol, Total: 170 mg/dL (ref 100–199)
HDL: 56 mg/dL (ref >39)
LDL Chol Calc (NIH): 102 mg/dL — ABNORMAL HIGH (ref 0–99)
Triglycerides: 60 mg/dL (ref 0–149)
VLDL Cholesterol Cal: 12 mg/dL (ref 5–40)

## 2023-09-11 LAB — CBC WITH DIFFERENTIAL/PLATELET
Basophils Absolute: 0 10*3/uL (ref 0.0–0.2)
Basos: 0 %
EOS (ABSOLUTE): 0.6 10*3/uL — ABNORMAL HIGH (ref 0.0–0.4)
Eos: 7 %
Hematocrit: 34.9 % (ref 34.0–46.6)
Hemoglobin: 11.2 g/dL (ref 11.1–15.9)
Immature Grans (Abs): 0 10*3/uL (ref 0.0–0.1)
Immature Granulocytes: 0 %
Lymphocytes Absolute: 1 10*3/uL (ref 0.7–3.1)
Lymphs: 14 %
MCH: 32.1 pg (ref 26.6–33.0)
MCHC: 32.1 g/dL (ref 31.5–35.7)
MCV: 100 fL — ABNORMAL HIGH (ref 79–97)
Monocytes Absolute: 0.6 10*3/uL (ref 0.1–0.9)
Monocytes: 9 %
Neutrophils Absolute: 5.3 10*3/uL (ref 1.4–7.0)
Neutrophils: 70 %
Platelets: 366 10*3/uL (ref 150–450)
RBC: 3.49 x10E6/uL — ABNORMAL LOW (ref 3.77–5.28)
RDW: 12.7 % (ref 11.7–15.4)
WBC: 7.6 10*3/uL (ref 3.4–10.8)

## 2023-09-11 LAB — HEMOGLOBIN A1C
Est. average glucose Bld gHb Est-mCnc: 108 mg/dL
Hgb A1c MFr Bld: 5.4 % (ref 4.8–5.6)

## 2023-09-11 LAB — HEPATITIS C ANTIBODY: Hep C Virus Ab: REACTIVE — AB

## 2023-09-24 ENCOUNTER — Inpatient Hospital Stay

## 2023-09-24 ENCOUNTER — Inpatient Hospital Stay: Attending: Oncology | Admitting: Obstetrics and Gynecology

## 2023-09-24 VITALS — BP 152/85 | HR 95 | Temp 98.3°F | Resp 20 | Wt 143.3 lb

## 2023-09-24 DIAGNOSIS — Z9221 Personal history of antineoplastic chemotherapy: Secondary | ICD-10-CM | POA: Insufficient documentation

## 2023-09-24 DIAGNOSIS — Z452 Encounter for adjustment and management of vascular access device: Secondary | ICD-10-CM | POA: Diagnosis not present

## 2023-09-24 DIAGNOSIS — B977 Papillomavirus as the cause of diseases classified elsewhere: Secondary | ICD-10-CM | POA: Insufficient documentation

## 2023-09-24 DIAGNOSIS — C539 Malignant neoplasm of cervix uteri, unspecified: Secondary | ICD-10-CM

## 2023-09-24 DIAGNOSIS — Z95828 Presence of other vascular implants and grafts: Secondary | ICD-10-CM

## 2023-09-24 DIAGNOSIS — Z08 Encounter for follow-up examination after completed treatment for malignant neoplasm: Secondary | ICD-10-CM | POA: Insufficient documentation

## 2023-09-24 DIAGNOSIS — Z923 Personal history of irradiation: Secondary | ICD-10-CM | POA: Insufficient documentation

## 2023-09-24 DIAGNOSIS — B1920 Unspecified viral hepatitis C without hepatic coma: Secondary | ICD-10-CM | POA: Diagnosis not present

## 2023-09-24 DIAGNOSIS — Z8541 Personal history of malignant neoplasm of cervix uteri: Secondary | ICD-10-CM | POA: Diagnosis not present

## 2023-09-24 MED ORDER — HEPARIN SOD (PORK) LOCK FLUSH 100 UNIT/ML IV SOLN
500.0000 [IU] | Freq: Once | INTRAVENOUS | Status: AC
  Administered 2023-09-24: 500 [IU] via INTRAVENOUS
  Filled 2023-09-24: qty 5

## 2023-09-24 MED ORDER — SODIUM CHLORIDE 0.9% FLUSH
10.0000 mL | Freq: Once | INTRAVENOUS | Status: AC
  Administered 2023-09-24: 10 mL via INTRAVENOUS
  Filled 2023-09-24: qty 10

## 2023-09-24 NOTE — Progress Notes (Signed)
 Gynecologic Oncology Interval Visit   Referring Provider: Carolynn Citrin, MD   Chief Concern: cervical cancer and abnormal pap  Subjective:  Rebecca Parker is a 73 y.o. G3P3 female who is seen in consultation from Dr. Madelene Schanz for cervical cancer now s/p chemo and radiation completed 02/08/21.   She presents today for follow up. No new gyn complaints.   Pap was 09/10/2021 and was unsatisfactory for interpretation.  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 Repeat pap 02/2023 was NILM- HR HPV Positive.   In interim, she established care with new pcp and was found to have hepatitis c. Has not seen anyone for treatment.    Gynecologic Oncology History:  12/01/2020-8/9/022 She was admitted from the ED for 1 day h/o right lower abdominal / pelvic pain. CT scan revealed a right ovarian cyst 5 cm and air in endometrial cavity. Bedside exam revealed a firm cervical rim and purulent d/c. WBC 23K concern for pyometra.    12/01/2020 Fx D+C and cervical bx. Uterus sounded to 9 cm.  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   Initally she was thought of urosepsis however neg urine culture. In retrospect, urine was probably contaminated with purulent cervical discharge. She was treated with gent and Cleocin  switched to oral Flagyl  and is currently on Augmentin . Clinically patient's pain improved with IV/ PO abx . Hospitalist helped managed DM and CHF ( no significant tx) She was also diagnosed with anemia. Hypoalbuminemia, and thrombocytosis. Her tox screen was positive for cocaine she says she only uses occasionally.    On pelvic exam suspect lateral vaginal disease >50% down the vault and disease involving the left fornix. Cervix enlarged >4 cm with grossly obvious tumor and hard to palpation. Uterus - may be enlarged with firm mass on the right aspect versus palpation  of the right adnexal mass. Positive parametrial involvement on the right and on the left with disease to or almost to the sidewall on the left. Rectovaginal exam was confirmatory.     12/27/2020, PET scan showed signs of cervical cancer with diffuse involvement of the uterus. Cystic and solid right ovarian lesion more likely related to diffuse cervical cancer.  Concomitant synchronous cystic ovarian neoplasm could have appearance but feel less likely given the diffuse nature of disease throughout the uterus and cervix.  Small lymph nodes in the pelvis in the left pelvis suspicious for nodal involvement at the common iliac level.  No solid organ distant metastasis.  Treated with external radiation therapy with weekly cisplatin .   She had pet for restaging on 05/09/21 which showed excellent response to treatment. No residual hypermetabolic tumor was identified. Persistent partially cystic right adnexal mass. Slightly smaller and SUV has decreased. Persistent known thyroid  neoplasm (see below biopsy).   Then received brachytherapy with Dr Americo Baker at [facility] in 1/23.   4/23 noted to have copious foul smelling vaginal discharge and prescribed Metrogel .  Discharge and odor improved.   09/10/2021 Pap was unsatisfactory for interpretation.  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  01/10/2022 CT C/A/P IMPRESSION: 1. Pulmonary nodules measuring up to 4 mm are unchanged from the prior examination. No new pulmonary nodules. No follow-up needed if patient is low-risk (and has no known or suspected primary neoplasm). Non-contrast chest CT can be considered in 12 months if patient is high-risk. This recommendation follows  the consensus statement: Guidelines for Management of Incidental Pulmonary Nodules Detected on CT Images: From the Fleischner Society 2017; Radiology 2017; 284:228-243. 2. Mild bladder wall thickening with surrounding inflammation concerning for cystitis. 3. Mild cardiomegaly. 4. Stable prominence  and low-attenuation of the right ovary. Please see pelvic ultrasound from 12/02/2020.  02/2023 Pap was NILM- HR HPV Positive.     Thyroid  Cancer History   She had thyroid  biopsy on 03/05/21.  A. THYROID  GLAND, RIGHT MIDDLE LOBE; ULTRASOUND-GUIDED FINE-NEEDLE ASPIRATION:  - SUSPICIOUS FOR FOLLICULAR NEOPLASM (B[place] CATEGORY IV).   She was seen by ENT on 02/13/2021 and there was no evidence of tonsillary or ENT cancer on exam. HIV test negative 11/2020. Thyroidectomy 12/18/21  03/05/22- US  Thyroid  No evidence of recurrent or residual thyroid  malignancy    Problem List: Patient Active Problem List   Diagnosis Date Noted   Stage 3b chronic kidney disease (HCC) 09/10/2023   Diabetes mellitus treated with oral medication (HCC) 09/10/2023   Port-A-Cath in place 01/17/2021   Abnormal positron emission tomography (PET) scan 01/17/2021   Tobacco use 01/03/2021   Encounter for antineoplastic chemotherapy 01/03/2021   Vitamin B12 deficiency 01/03/2021   B12 deficiency anemia 01/03/2021   Normocytic anemia 12/23/2020   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HCC) 12/23/2020   Goals of care, counseling/discussion 12/23/2020   Cocaine abuse (HCC) 12/23/2020   Sepsis (HCC) 12/01/2020   Ovarian mass, right 12/01/2020   Acute lower UTI 12/01/2020   Pelvic pain 12/01/2020   Mixed incontinence 10/24/2015   Venous insufficiency of both lower extremities 11/24/2014   Benign essential hypertension 11/18/2014   Chronic diastolic CHF (congestive heart failure), NYHA class 2 (HCC) 05/17/2014   Mixed hyperlipidemia 05/17/2014   Tobacco dependence 02/10/2014   Thyroid  nodule 02/10/2014   Spinal stenosis, lumbar region without neurogenic claudication 03/05/2013   Constipation 01/29/2013   Eosinophil count raised 10/20/2012   Urinary incontinence 10/19/2012   Controlled diabetes mellitus type 2 with complications (HCC) 07/24/2012   Neck pain 06/15/2012   Polyneuropathy 11/15/2011   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/13/2011   Edema 05/29/2011   Vitamin D deficiency 07/31/2009   Insomnia 07/27/2009   Past Medical History: 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, cN1, cM0); Tx'd with Cisplatin  + EBRT   Chest pain, unspecified    CHF (congestive heart failure) (HCC)    Chronic diastolic heart failure, NYHA class 2 (HCC)    Cocaine use    none since Cancer dx (11/2021)   Diabetes mellitus without complication (HCC)    Edentulous    lost dentures   Grade I diastolic dysfunction    Hip pain    HLD (hyperlipidemia)    Hyperplastic colon polyp    Hypertension    Hypothyroidism    Multinodular goiter    Port-A-Cath in place    Thyroid  cancer (HCC)    Thyroid  mass    a.) 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)   Tubular adenoma of colon     Past Surgical History: Past Surgical History:  Procedure Laterality Date   ABDOMINAL HYSTERECTOMY     CATARACT EXTRACTION W/PHACO Left 08/14/2023   Procedure: PHACOEMULSIFICATION, CATARACT, WITH IOL INSERTION  3.93  00:31.6;  Surgeon: Trudi Fus, MD;  Location: [facility];  Service: Ophthalmology;  Laterality: Left;   CATARACT EXTRACTION W/PHACO  Right 08/28/2023   Procedure: PHACOEMULSIFICATION, CATARACT, WITH IOL INSERTION 2.67 00:21.9;  Surgeon: Trudi Fus, MD;  Location: [facility];  Service: Ophthalmology;  Laterality: Right;   COLONOSCOPY WITH PROPOFOL  N/A 11/24/2015   Procedure: COLONOSCOPY WITH PROPOFOL ;  Surgeon: Deveron Fly, MD;  Location: [facility];  Service: Endoscopy;  Laterality: N/A;   COLONOSCOPY WITH PROPOFOL  N/A 03/18/2019   Procedure: COLONOSCOPY WITH PROPOFOL ;  Surgeon: Irby Mannan, MD;  Location: [facility] ENDOSCOPY;  Service: Endoscopy;  Laterality: N/A;   DILATION AND CURETTAGE OF UTERUS N/A 12/01/2020    Procedure: DILATATION AND CURETTAGE with cervical biopsies;  Surgeon: Schermerhorn, Joselyn Nicely, MD;  Location: [facility] ORS;  Service: Gynecology;  Laterality: N/A;   ESOPHAGOGASTRODUODENOSCOPY (EGD) WITH PROPOFOL  N/A 03/18/2019   Procedure: ESOPHAGOGASTRODUODENOSCOPY (EGD) WITH PROPOFOL ;  Surgeon: Irby Mannan, MD;  Location: [facility] ENDOSCOPY;  Service: Endoscopy;  Laterality: N/A;   PORTA CATH INSERTION N/A 01/16/2021   Procedure: PORTA CATH INSERTION;  Surgeon: Jackquelyn Mass, MD;  Location: [facility] INVASIVE CV LAB;  Service: Cardiovascular;  Laterality: N/A;   right ankle orif     THYROIDECTOMY, COMPLETION Bilateral 12/18/2021    Past Gynecologic History:  As per HPI. She does not recall dates of menarche, LMP. She does have a h/o abnormal Pap, but last Pap not listed.   OB History:  OB History  Gravida Para Term Preterm AB Living  3 3      SAB IAB Ectopic Multiple Live Births          # Outcome Date GA Lbr Len/2nd Weight Sex Type Anes PTL Lv  3 Para           2 Para           1 Para             Family History: Family History  Problem Relation Age of Onset   Breast cancer Maternal Aunt    Breast cancer Paternal Aunt     Social History: Social History   Socioeconomic History   Marital status: Legally Separated    Spouse name: Not on file   Number of children: Not on file   Years of education: Not on file   Highest education level: Not on file  Occupational History   Not on file  Tobacco Use   Smoking status: Every Day    Current packs/day: 0.10    Average packs/day: 0.1 packs/day for 59.4 years (5.9 ttl pk-yrs)    Types: Cigarettes    Start date: 04/29/1964    Passive exposure: Never   Smokeless tobacco: Never  Vaping Use   Vaping status: Never Used  Substance and Sexual Activity   Alcohol use: Not Currently   Drug use: Yes    Types: "Crack" cocaine    Comment: some times   Sexual activity: Not Currently  Other Topics Concern   Not on file  Social  History Narrative   Not on file   Social Drivers of Health   Financial Resource Strain: Not on file  Food Insecurity: Not on file  Transportation Needs: Not on file  Physical Activity: Not on file  Stress: Not on file  Social Connections: Not on file  Intimate Partner Violence: Not on file    Allergies: Allergies  Allergen Reactions   Oxybutynin  Itching and Other (See Comments)   Pregabalin  Other (See Comments)    Hallucentations   Ace Inhibitors Itching    Rash   Cyclobenzaprine Itching  Rash and 'made me nervous'    Current Medications: Current Outpatient Medications  Medication Sig Dispense Refill   aspirin EC 81 MG tablet Take 81 mg by mouth daily.     Calcium  Carbonate-Vitamin D 500-125 MG-UNIT TABS Take 1 tablet by mouth daily.     Ferrous Sulfate  (IRON) 325 (65 Fe) MG TABS Take 1 tablet by mouth 2 (two) times daily.     hydrOXYzine  (ATARAX /VISTARIL ) 25 MG tablet Take 25 mg by mouth daily. Take 25mg . By mouth every morning     losartan (COZAAR) 50 MG tablet Take 50 mg by mouth 2 (two) times daily.     metFORMIN (GLUCOPHAGE) 500 MG tablet Take 500 mg by mouth 2 (two) times daily with a meal.     mirtazapine (REMERON) 15 MG tablet Take 15 mg by mouth at bedtime.     ondansetron  (ZOFRAN ) 4 MG tablet Take 1 tablet (4 mg total) by mouth every 6 (six) hours as needed for nausea. 20 tablet 0   simvastatin  (ZOCOR ) 20 MG tablet Take 20 mg by mouth daily.     No current facility-administered medications for this visit.    Review of Systems General:  fatigue and weakness Skin: no complaints Eyes: no complaints HEENT: no complaints Breasts: no complaints Pulmonary: shortness of breath related to her heart failure Cardiac: no complaints Gastrointestinal: no complaints Genitourinary/Sexual: no complaints Ob/Gyn: no complaints Musculoskeletal: no complaints Hematology: no complaints Neurologic/Psych: no complaints   Objective:  Physical Examination:  BP (!) 152/85    Pulse 95   Temp 98.3 F (36.8 C)   Resp 20   Wt 143 lb 4.8 oz (65 kg)   SpO2 100%   BMI 27.69 kg/m    GENERAL: Patient is a well appearing female in no acute distress HEENT:  Sclera clear. Anicteric NODES:  Negative axillary, supraclavicular, inguinal lymph node survery LUNGS:  Clear to auscultation bilaterally.   HEART:  Regular rate and rhythm.  ABDOMEN:  Soft, nontender.  No hernias, incisions well healed. No masses or ascites EXTREMITIES:  No peripheral edema. Atraumatic. No cyanosis SKIN:  Clear with no obvious rashes or skin changes.  NEURO:  Nonfocal. Well oriented.  Appropriate affect.  Pelvic: Exam chaperoned by Nurse navigator Vulva: normal appearing vulva with no masses, tenderness or lesions Vagina: normal vagina other than foreshortened Cervix: unable to identify due to aggluination Uterus: not enlarged.  BME: Exam is limited by vaginal agglutination.  No masses or nodularity were noted but there is thickness of the upper vaginal wall on the left.  Parametria was smooth bilaterally.  Lab Review N/A  Radiologic Imaging: Per hpi    Assessment:  Rebecca Parker is a 73 y.o. female diagnosed with locally advanced stage IIIC-r cervical cancer with right ovarian mass which may represent metastatic disease versus more likely benign ovarian cyst/TOA/separate primary (the latter is unlikely) s/p external beam radiotherapy with weekly cisplatin  completed in 10/22. Then had brachytherapy x 5 from 12/22-1/23. She had PET for restaging on 05/09/21 which showed excellent response to treatment. No residual hypermetabolic tumor was identified.  Excellent response based on exam. NED on exam and CT scan shows no evidence of disease 9/23.   Mucosal radiation reaction of upper vagina.   NILM pap, HR HPV positive 2024.   HTN, asymptomatic  Pulmonary lesions- indeterminate- stable on 01/09/2022 imaging. Smoker.   Hepatitis C- untreated  Medical co-morbidities complicating care:  Body mass index is 27.69 kg/m. Positive cocaine use. Type 2 diabetes mellitus without  complication; h/o sepsis; HTN; Chronic diastolic CHF (congestive heart failure), NYHA class 2 (HCC) Plan:   Problem List Items Addressed This Visit       Genitourinary   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HCC) - Primary   Other Visit Diagnoses       HPV in female          She will follow up in 6 months to check on her vaginal exam.  Obtain Pap smear at that time.  Pulmonary lesions- stable on 01/09/22 CT- recommended non-contrast Chest ct in 2024. Recommend she see Dr Wilhelmenia Harada for follow up.   Precautions given regarding high blood pressure and stroke.  Recommended that she follow-up with her primary care provider.  Hepatitis c- recommend she f/u with pcp for ref to GI/hepatitis clinic.   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 Reviewed symptoms that would warrant sooner return: anterior neck symptoms (pain, tenderness, and/or swelling), dysphagia, dysphonia, hoarseness, positional dyspnea, or persistent anterior cervical lymphadenopathy. Recommend surveillance blood work including IT consultant (Thyroglobulin + Thyroglobulin antibodies [TgAb] and ultrasound thyroid /neck. She has not followed up at [facility] which I encouraged her to do so or she could be followed by ENT, medical oncology/Dr Wilhelmenia Harada. I'll send schedule message to Dr Jackqueline Mason team to see her for follow up and blood work.   IV port in place. She is now > 2 years of completing treatment and we recommend removal. She is in agreement. Ref placed to Dr Prescilla Brod, vascular surgery for removal.   The patient's diagnosis, an outline of the further diagnostic and laboratory studies which will be required, the recommendation, and alternatives were discussed.  All questions were answered to the patient's satisfaction.  Kenney Peacemaker, DNP, AGNP-C, AOCNP [facility] [phone] (clinic)  I personally  had a face to face interaction and evaluated the patient jointly with the NP, Ms. Kenney Peacemaker.  I have reviewed her history and available records and have performed the key portions of the physical exam including inguinal lymph node survey, abdominal exam, pelvic exam with my findings confirming those documented above by the APP.  I have discussed the case with the APP and the patient.  I agree with the above documentation, assessment and plan which was fully formulated by me.  Counseling was completed by me.   I personally saw the patient and performed a substantive portion of this encounter in conjunction with the listed APP as documented above.  Jaryn Rosko Iola Manila, MD

## 2023-09-24 NOTE — Addendum Note (Signed)
 Addended by: Nobie Batch on: 09/24/2023 10:28 AM   Modules accepted: Level of Service

## 2023-10-01 ENCOUNTER — Other Ambulatory Visit: Payer: Self-pay | Admitting: Nurse Practitioner

## 2023-10-01 NOTE — Telephone Encounter (Signed)
 Called and spoke to patient. She states she will use [facility]. Patient requests that all meds be sent to them since she can no longer use [facility].

## 2023-10-01 NOTE — Telephone Encounter (Unsigned)
 Copied from CRM [id number]. Topic: Clinical - Medication Refill >> Oct 01, 2023 11:26 AM Phil Braun wrote: Medication: Levothyroxine 112 micrograms (once a day) 30 minutes before breakfast   This was written at [facility] by Dr Fulton Job  Has the patient contacted their pharmacy? Yes (Agent: If no, request that the patient contact the pharmacy for the refill. If patient does not wish to contact the pharmacy document the reason why and proceed with request.) (Agent: If yes, when and what did the pharmacy advise?)  This is the patient's preferred pharmacy:  [facility] [address], [place] - [address] [place] [address] Phone: [phone] Fax: [phone]  Is this the correct pharmacy for this prescription? Yes If no, delete pharmacy and type the correct one.   Has the prescription been filled recently? No  Is the patient out of the medication? Yes  Has the patient been seen for an appointment in the last year OR does the patient have an upcoming appointment? Yes  Can we respond through MyChart? No  Agent: Please be advised that Rx refills may take up to 3 business days. We ask that you follow-up with your pharmacy.

## 2023-10-01 NOTE — Telephone Encounter (Signed)
 Her endocrinologist had her on Levothyroxine 112mcg daily.  Can we verify her pharmacy? Right now [facility] but she isn't a patient there anymore.

## 2023-10-01 NOTE — Telephone Encounter (Signed)
 Patient requested a refill of levothyroxine. This medication is not on patient's current medication list. This RN called patient to verify request. Patient could not verify the name of the medication, but stated it was a "little pink pill" for her thyroid .   Patient was upset and spoke repetitively about how this medication was prescribed by a provider she was seeing at [facility]. Patient stated she has not been able to get in contact with provider. Please advise.

## 2023-10-01 NOTE — Telephone Encounter (Signed)
 Routing to provider to review. Looks like patient may have been on Levothyroxine 112 mcg in the past.

## 2023-10-02 MED ORDER — METFORMIN HCL 500 MG PO TABS: 500.0000 mg | ORAL_TABLET | Freq: Two times a day (BID) | ORAL | 0 refills | Status: DC

## 2023-10-02 MED ORDER — ONDANSETRON HCL 4 MG PO TABS: 4.0000 mg | ORAL_TABLET | Freq: Four times a day (QID) | ORAL | 0 refills | Status: DC | PRN

## 2023-10-02 MED ORDER — LEVOTHYROXINE SODIUM 112 MCG PO TABS: 112.0000 ug | ORAL_TABLET | Freq: Every day | ORAL | 0 refills | Status: DC

## 2023-10-02 MED ORDER — LOSARTAN POTASSIUM 50 MG PO TABS: 50.0000 mg | ORAL_TABLET | Freq: Two times a day (BID) | ORAL | 0 refills | Status: DC

## 2023-10-02 MED ORDER — SIMVASTATIN 20 MG PO TABS: 20.0000 mg | ORAL_TABLET | Freq: Every day | ORAL | 0 refills | Status: AC

## 2023-10-02 MED ORDER — MIRTAZAPINE 15 MG PO TABS: 15.0000 mg | ORAL_TABLET | Freq: Every day | ORAL | 0 refills | Status: DC

## 2023-10-02 MED ORDER — IRON 325 (65 FE) MG PO TABS: 1.0000 | ORAL_TABLET | Freq: Two times a day (BID) | ORAL | 0 refills | Status: DC

## 2023-10-02 MED ORDER — CALCITRIOL 0.25 MCG PO CAPS: 0.2500 ug | ORAL_CAPSULE | Freq: Every day | ORAL | 0 refills | Status: DC

## 2023-10-02 MED ORDER — HYDROXYZINE HCL 25 MG PO TABS: 25.0000 mg | ORAL_TABLET | Freq: Every day | ORAL | 0 refills | Status: DC

## 2023-10-13 ENCOUNTER — Telehealth (INDEPENDENT_AMBULATORY_CARE_PROVIDER_SITE_OTHER): Payer: Self-pay

## 2023-10-13 NOTE — Telephone Encounter (Signed)
 Spoke with the patient and she is scheduled with Dr. Prescilla Brod for a port removal on 10/28/23 with a 12:00 pm arrival time to the [facility]. Pre-procedure instructions were discussed and will be sent to Mychart and mailed. Patient was offered 10/13/23, 10/21/23 but declined both dates.

## 2023-10-15 ENCOUNTER — Ambulatory Visit: Admitting: Nurse Practitioner

## 2023-10-15 ENCOUNTER — Encounter: Payer: Self-pay | Admitting: Nurse Practitioner

## 2023-10-15 VITALS — BP 138/84 | HR 84 | Ht 60.0 in | Wt 145.0 lb

## 2023-10-15 DIAGNOSIS — K739 Chronic hepatitis, unspecified: Secondary | ICD-10-CM

## 2023-10-15 DIAGNOSIS — Z794 Long term (current) use of insulin: Secondary | ICD-10-CM

## 2023-10-15 DIAGNOSIS — C539 Malignant neoplasm of cervix uteri, unspecified: Secondary | ICD-10-CM

## 2023-10-15 DIAGNOSIS — Z0000 Encounter for general adult medical examination without abnormal findings: Secondary | ICD-10-CM

## 2023-10-15 DIAGNOSIS — E118 Type 2 diabetes mellitus with unspecified complications: Secondary | ICD-10-CM

## 2023-10-15 DIAGNOSIS — E782 Mixed hyperlipidemia: Secondary | ICD-10-CM

## 2023-10-15 DIAGNOSIS — I5032 Chronic diastolic (congestive) heart failure: Secondary | ICD-10-CM

## 2023-10-15 DIAGNOSIS — N1832 Chronic kidney disease, stage 3b: Secondary | ICD-10-CM

## 2023-10-15 DIAGNOSIS — E890 Postprocedural hypothyroidism: Secondary | ICD-10-CM

## 2023-10-15 DIAGNOSIS — I10 Essential (primary) hypertension: Secondary | ICD-10-CM

## 2023-10-15 NOTE — Assessment & Plan Note (Signed)
 Chronic.  Controlled.  Continue with current medication regimen of Losartan  50mg  BID.  May need to add CCB to help with blood pressure control.  Labs ordered today.  Return to clinic in 6 months for reevaluation.  Call sooner if concerns arise.

## 2023-10-15 NOTE — Progress Notes (Signed)
 BP 138/84   Pulse 84   Ht 5' (1.524 m)   Wt 145 lb (65.8 kg)   SpO2 98%   BMI 28.32 kg/m    Subjective:    Patient ID: Rebecca Parker, female    DOB: 01-19-1951, 73 y.o.   MRN: [id number]  HPI: Rebecca Parker is a 73 y.o. female presenting on 10/15/2023 for comprehensive medical examination. Current medical complaints include:doesn't want to see as many doctors  She currently lives with: Menopausal Symptoms: no  Patient states she will be having her Port removed on July 1.    HYPERTENSION / HYPERLIPIDEMIA Satisfied with current treatment? yes Duration of hypertension: years BP monitoring frequency: not checking BP range:  BP medication side effects: no Past BP meds: losartan  (cozaar ) Duration of hyperlipidemia: years Cholesterol medication side effects: no Cholesterol supplements: none Past cholesterol medications:Simvastatin   Medication compliance: excellent compliance Aspirin: yes Recent stressors: no Recurrent headaches: no Visual changes: no Palpitations: no Dyspnea: yes Chest pain: no Lower extremity edema: no Dizzy/lightheaded: no  DIABETES Last A1c was 5.9%.  Doing well with Metformin  500mg  BID Hypoglycemic episodes:no Polydipsia/polyuria: no Visual disturbance: no Chest pain: no Paresthesias: no Glucose Monitoring: no  Accucheck frequency: Not Checking  Fasting glucose:  Post prandial:  Evening:  Before meals: Taking Insulin ?: no  Long acting insulin :  Short acting insulin : Blood Pressure Monitoring: not checking Retinal Examination: Not up to Date Foot Exam: Up to date Diabetic Education: Not Completed Pneumovax: Up to Date Influenza: Up to Date Aspirin: yes  HYPOTHYROIDISM Thyroid  control status:controlled Satisfied with current treatment? yes Medication side effects: no Medication compliance: excellent compliance Etiology of hypothyroidism:  Recent dose adjustment:no Fatigue: yes Cold intolerance: no Heat intolerance: no Weight  gain: no Weight loss: no Constipation: no Diarrhea/loose stools: no Palpitations: no Lower extremity edema: no Anxiety/depressed mood: yes  Depression Screen done today and results listed below:     10/15/2023   10:13 AM 09/10/2023    9:24 AM  Depression screen PHQ 2/9  Decreased Interest 2 0  Down, Depressed, Hopeless 2 0  PHQ - 2 Score 4 0  Altered sleeping 0 0  Tired, decreased energy 3 0  Change in appetite 0 0  Feeling bad or failure about yourself  2 0  Trouble concentrating 1 0  Moving slowly or fidgety/restless 2 0  Suicidal thoughts 0 0  PHQ-9 Score 12 0    The patient does not have a history of falls. I did complete a risk assessment for falls. A plan of care for falls was documented.   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, cN1, cM0); Tx'd with Cisplatin  + EBRT   Chest pain, unspecified    CHF (congestive heart failure) (HCC)    Chronic diastolic heart failure, NYHA class 2 (HCC)    Cocaine use    none since Cancer dx (11/2021)   Diabetes mellitus without complication (HCC)    Edentulous    lost dentures   Grade I diastolic dysfunction    Hip pain    HLD (hyperlipidemia)    Hyperplastic colon polyp    Hypertension    Hypothyroidism    Multinodular goiter    Port-A-Cath in place    Thyroid  cancer (HCC)    Thyroid  mass    a.) 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)   Tubular adenoma of colon     Surgical History:  Past Surgical History:  Procedure Laterality Date   ABDOMINAL HYSTERECTOMY     CATARACT EXTRACTION W/PHACO Left 08/14/2023   Procedure: PHACOEMULSIFICATION, CATARACT, WITH IOL INSERTION  3.93  00:31.6;  Surgeon: Trudi Fus, MD;  Location: [facility];  Service: Ophthalmology;  Laterality: Left;   CATARACT EXTRACTION W/PHACO Right  08/28/2023   Procedure: PHACOEMULSIFICATION, CATARACT, WITH IOL INSERTION 2.67 00:21.9;  Surgeon: Trudi Fus, MD;  Location: [facility];  Service: Ophthalmology;  Laterality: Right;   COLONOSCOPY WITH PROPOFOL  N/A 11/24/2015   Procedure: COLONOSCOPY WITH PROPOFOL ;  Surgeon: Deveron Fly, MD;  Location: [facility];  Service: Endoscopy;  Laterality: N/A;   COLONOSCOPY WITH PROPOFOL  N/A 03/18/2019   Procedure: COLONOSCOPY WITH PROPOFOL ;  Surgeon: Irby Mannan, MD;  Location: [facility] ENDOSCOPY;  Service: Endoscopy;  Laterality: N/A;   DILATION AND CURETTAGE OF UTERUS N/A 12/01/2020   Procedure: DILATATION AND CURETTAGE with cervical biopsies;  Surgeon: Schermerhorn, Joselyn Nicely, MD;  Location: [facility] ORS;  Service: Gynecology;  Laterality: N/A;   ESOPHAGOGASTRODUODENOSCOPY (EGD) WITH PROPOFOL  N/A 03/18/2019   Procedure: ESOPHAGOGASTRODUODENOSCOPY (EGD) WITH PROPOFOL ;  Surgeon: Irby Mannan, MD;  Location: [facility] ENDOSCOPY;  Service: Endoscopy;  Laterality: N/A;   PORTA CATH INSERTION N/A 01/16/2021   Procedure: PORTA CATH INSERTION;  Surgeon: Jackquelyn Mass, MD;  Location: [facility] INVASIVE CV LAB;  Service: Cardiovascular;  Laterality: N/A;   right ankle orif     THYROIDECTOMY, COMPLETION Bilateral 12/18/2021    Medications:  Current Outpatient Medications on File Prior to Visit  Medication Sig   aspirin EC 81 MG tablet Take 81 mg by mouth daily.   calcitRIOL  (ROCALTROL ) 0.25 MCG capsule Take 1 capsule (0.25 mcg total) by mouth daily.   Calcium  Carbonate-Vitamin D 500-125 MG-UNIT TABS Take 1 tablet by mouth daily.   Ferrous Sulfate  (IRON ) 325 (65 Fe) MG TABS Take 1 tablet (325 mg total) by mouth 2 (two) times daily.   hydrOXYzine  (ATARAX ) 25 MG tablet Take 1 tablet (25 mg total) by mouth daily. Take 25mg . By mouth every morning   levothyroxine  (SYNTHROID ) 112 MCG tablet Take 1 tablet (112 mcg total) by mouth daily.   losartan  (COZAAR ) 50 MG tablet Take 1 tablet  (50 mg total) by mouth 2 (two) times daily.   metFORMIN  (GLUCOPHAGE ) 500 MG tablet Take 1 tablet (500 mg total) by mouth 2 (two) times daily with a meal.   mirtazapine  (REMERON ) 15 MG tablet Take 1 tablet (15 mg total) by mouth at bedtime.   ondansetron  (ZOFRAN ) 4 MG tablet Take 1 tablet (4 mg total) by mouth every 6 (six) hours as needed for nausea.   simvastatin  (ZOCOR ) 20 MG tablet Take 1 tablet (20 mg total) by mouth daily.   [DISCONTINUED] prochlorperazine  (COMPAZINE ) 10 MG tablet Take 1 tablet (10 mg total) by mouth every 6 (six) hours as needed (Nausea or vomiting).   No current facility-administered medications on file prior to visit.    Allergies:  Allergies  Allergen Reactions   Oxybutynin  Itching and Other (See Comments)   Pregabalin  Other (See Comments)    Hallucentations   Ace Inhibitors Itching    Rash   Cyclobenzaprine Itching    Rash and 'made me nervous'    Social History:  Social History   Socioeconomic History   Marital status: Legally Separated    Spouse name: Not on file  Number of children: Not on file   Years of education: Not on file   Highest education level: Not on file  Occupational History   Not on file  Tobacco Use   Smoking status: Every Day    Current packs/day: 0.10    Average packs/day: 0.1 packs/day for 59.5 years (5.9 ttl pk-yrs)    Types: Cigarettes    Start date: 04/29/1964    Passive exposure: Never   Smokeless tobacco: Never  Vaping Use   Vaping status: Never Used  Substance and Sexual Activity   Alcohol use: Not Currently   Drug use: Yes    Types: Crack cocaine    Comment: some times   Sexual activity: Not Currently  Other Topics Concern   Not on file  Social History Narrative   Not on file   Social Drivers of Health   Financial Resource Strain: Not on file  Food Insecurity: No Food Insecurity (10/15/2023)   Hunger Vital Sign    Worried About Running Out of Food in the Last Year: Never true    Ran Out of Food in the  Last Year: Never true  Transportation Needs: No Transportation Needs (10/15/2023)   PRAPARE - Administrator, Civil Service (Medical): No    Lack of Transportation (Non-Medical): No  Physical Activity: Not on file  Stress: Not on file  Social Connections: Not on file  Intimate Partner Violence: Not on file   Social History   Tobacco Use  Smoking Status Every Day   Current packs/day: 0.10   Average packs/day: 0.1 packs/day for 59.5 years (5.9 ttl pk-yrs)   Types: Cigarettes   Start date: 04/29/1964   Passive exposure: Never  Smokeless Tobacco Never   Social History   Substance and Sexual Activity  Alcohol Use Not Currently    Family History:  Family History  Problem Relation Age of Onset   Breast cancer Maternal Aunt    Breast cancer Paternal Aunt     Past medical history, surgical history, medications, allergies, family history and social history reviewed with patient today and changes made to appropriate areas of the chart.   Review of Systems  Constitutional:  Positive for malaise/fatigue. Negative for weight loss.  HENT:         Denies vision changes.  Eyes:  Negative for blurred vision and double vision.  Respiratory:  Positive for shortness of breath.   Cardiovascular:  Negative for chest pain, palpitations and leg swelling.  Gastrointestinal:  Negative for constipation and diarrhea.  Neurological:  Negative for dizziness, tingling and headaches.  Endo/Heme/Allergies:  Negative for polydipsia.       Denies Polyuria  Psychiatric/Behavioral:  Negative for depression. The patient is not nervous/anxious.    All other ROS negative except what is listed above and in the HPI.      Objective:    BP 138/84   Pulse 84   Ht 5' (1.524 m)   Wt 145 lb (65.8 kg)   SpO2 98%   BMI 28.32 kg/m   Wt Readings from Last 3 Encounters:  10/15/23 145 lb (65.8 kg)  09/24/23 143 lb 4.8 oz (65 kg)  09/10/23 144 lb (65.3 kg)    Physical Exam Vitals and nursing note  reviewed.  Constitutional:      General: She is awake. She is not in acute distress.    Appearance: Normal appearance. She is well-developed. She is obese. She is not ill-appearing.  HENT:     Head: Normocephalic  and atraumatic.     Right Ear: Hearing, tympanic membrane, ear canal and external ear normal. No drainage.     Left Ear: Hearing, tympanic membrane, ear canal and external ear normal. No drainage.     Nose: Nose normal.     Right Sinus: No maxillary sinus tenderness or frontal sinus tenderness.     Left Sinus: No maxillary sinus tenderness or frontal sinus tenderness.     Mouth/Throat:     Mouth: Mucous membranes are moist.     Pharynx: Oropharynx is clear. Uvula midline. No pharyngeal swelling, oropharyngeal exudate or posterior oropharyngeal erythema.   Eyes:     General: Lids are normal.        Right eye: No discharge.        Left eye: No discharge.     Extraocular Movements: Extraocular movements intact.     Conjunctiva/sclera: Conjunctivae normal.     Pupils: Pupils are equal, round, and reactive to light.     Visual Fields: Right eye visual fields normal and left eye visual fields normal.   Neck:     Thyroid : No thyromegaly.     Vascular: No carotid bruit.     Trachea: Trachea normal.   Cardiovascular:     Rate and Rhythm: Normal rate and regular rhythm.     Heart sounds: Normal heart sounds. No murmur heard.    No gallop.  Pulmonary:     Effort: Pulmonary effort is normal. No accessory muscle usage or respiratory distress.     Breath sounds: Normal breath sounds.  Chest:  Breasts:    Right: Normal.     Left: Normal.  Abdominal:     General: Bowel sounds are normal.     Palpations: Abdomen is soft. There is no hepatomegaly or splenomegaly.     Tenderness: There is no abdominal tenderness.   Musculoskeletal:        General: Normal range of motion.     Cervical back: Normal range of motion and neck supple.     Right lower leg: No edema.     Left lower  leg: No edema.  Lymphadenopathy:     Head:     Right side of head: No submental, submandibular, tonsillar, preauricular or posterior auricular adenopathy.     Left side of head: No submental, submandibular, tonsillar, preauricular or posterior auricular adenopathy.     Cervical: No cervical adenopathy.     Upper Body:     Right upper body: No supraclavicular, axillary or pectoral adenopathy.     Left upper body: No supraclavicular, axillary or pectoral adenopathy.   Skin:    General: Skin is warm and dry.     Capillary Refill: Capillary refill takes less than 2 seconds.     Findings: No rash.   Neurological:     Mental Status: She is alert and oriented to person, place, and time.     Gait: Gait is intact.   Psychiatric:        Attention and Perception: Attention normal.        Mood and Affect: Mood normal.        Speech: Speech normal.        Behavior: Behavior normal. Behavior is cooperative.        Thought Content: Thought content normal.        Judgment: Judgment normal.     Results for orders placed or performed in visit on 09/10/23  Microalbumin, Urine Waived   Collection Time: 09/10/23  9:51 AM  Result Value Ref Range   Microalb, Ur Waived 150 (H) 0 - 19 mg/L   Creatinine, Urine Waived 200 10 - 300 mg/dL   Microalb/Creat Ratio 30-300 (H) <30 mg/g  Comp Met (CMET)   Collection Time: 09/10/23  9:54 AM  Result Value Ref Range   Glucose 87 70 - 99 mg/dL   BUN 18 8 - 27 mg/dL   Creatinine, Ser 7.82 (H) 0.57 - 1.00 mg/dL   eGFR 28 (L) >95 AO/ZHY/8.65   BUN/Creatinine Ratio 10 (L) 12 - 28   Sodium 143 134 - 144 mmol/L   Potassium 4.9 3.5 - 5.2 mmol/L   Chloride 104 96 - 106 mmol/L   CO2 21 20 - 29 mmol/L   Calcium  9.0 8.7 - 10.3 mg/dL   Total Protein 6.3 6.0 - 8.5 g/dL   Albumin 4.1 3.8 - 4.8 g/dL   Globulin, Total 2.2 1.5 - 4.5 g/dL   Bilirubin Total <7.8 0.0 - 1.2 mg/dL   Alkaline Phosphatase 78 44 - 121 IU/L   AST 16 0 - 40 IU/L   ALT 11 0 - 32 IU/L  HgB A1c    Collection Time: 09/10/23  9:54 AM  Result Value Ref Range   Hgb A1c MFr Bld 5.4 4.8 - 5.6 %   Est. average glucose Bld gHb Est-mCnc 108 mg/dL  CBC w/Diff   Collection Time: 09/10/23  9:54 AM  Result Value Ref Range   WBC 7.6 3.4 - 10.8 x10E3/uL   RBC 3.49 (L) 3.77 - 5.28 x10E6/uL   Hemoglobin 11.2 11.1 - 15.9 g/dL   Hematocrit 46.9 62.9 - 46.6 %   MCV 100 (H) 79 - 97 fL   MCH 32.1 26.6 - 33.0 pg   MCHC 32.1 31.5 - 35.7 g/dL   RDW 52.8 41.3 - 24.4 %   Platelets 366 150 - 450 x10E3/uL   Neutrophils 70 Not Estab. %   Lymphs 14 Not Estab. %   Monocytes 9 Not Estab. %   Eos 7 Not Estab. %   Basos 0 Not Estab. %   Neutrophils Absolute 5.3 1.4 - 7.0 x10E3/uL   Lymphocytes Absolute 1.0 0.7 - 3.1 x10E3/uL   Monocytes Absolute 0.6 0.1 - 0.9 x10E3/uL   EOS (ABSOLUTE) 0.6 (H) 0.0 - 0.4 x10E3/uL   Basophils Absolute 0.0 0.0 - 0.2 x10E3/uL   Immature Granulocytes 0 Not Estab. %   Immature Grans (Abs) 0.0 0.0 - 0.1 x10E3/uL  Lipid Profile   Collection Time: 09/10/23  9:54 AM  Result Value Ref Range   Cholesterol, Total 170 100 - 199 mg/dL   Triglycerides 60 0 - 149 mg/dL   HDL 56 >01 mg/dL   VLDL Cholesterol Cal 12 5 - 40 mg/dL   LDL Chol Calc (NIH) 027 (H) 0 - 99 mg/dL   Chol/HDL Ratio 3.0 0.0 - 4.4 ratio  Hepatitis C Antibody   Collection Time: 09/10/23  9:54 AM  Result Value Ref Range   Hep C Virus Ab Reactive (A) Non Reactive      Assessment & Plan:   Problem List Items Addressed This Visit       Cardiovascular and Mediastinum   Benign essential hypertension   Chronic.  Controlled.  Continue with current medication regimen of Losartan  50mg  BID.  May need to add CCB to help with blood pressure control.  Labs ordered today.  Return to clinic in 6 months for reevaluation.  Call sooner if concerns arise.  Chronic diastolic CHF (congestive heart failure), NYHA class 2 (HCC)   Chronic. Not currently seeing Cardiology.  Last saw Dr. Bary Likes in 2021.  No recent ECHO  imaging available to review in EPIC.  Referral placed for Cardiology.       Relevant Orders   Ambulatory referral to Cardiology     Endocrine   Post-surgical hypothyroidism (Chronic)   Chronic.  Controlled.  Continue with current medication regimen.  Labs ordered today.  Return to clinic in 6 months for reevaluation.  Call sooner if concerns arise.        Relevant Orders   TSH   T4   Controlled diabetes mellitus type 2 with complications (HCC)   Chronic.  Endorses taking Metformin  500mg  BID.  Last A1c was 5.4%.  Microalbumin checked.  Does see [facility]- will request eye exam.  Labs ordered today. Will follow up in 3 month.  Call sooner if concerns aries.         Genitourinary   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HCC)   Followed by Oncology.  Will have Port removed on July 1.       Stage 3b chronic kidney disease (HCC)   Chronic.  Need to keep blood pressure well controlled.  Would benefit from SGLT2.  Labs ordered at visit today.  Will make recommendations based on lab results.          Other   Mixed hyperlipidemia   Labs ordered at visit today.  Will make recommendations based on lab results.  Continue with Simvastatin .       Relevant Orders   Lipid panel   Other Visit Diagnoses       Annual physical exam    -  Primary   Health maintenance reviewed.  Labs ordered.  Vaccines reviewed.  Dexa scan up to date.   Relevant Orders   CBC with Differential/Platelet   Comprehensive metabolic panel with GFR   Lipid panel   TSH   T4     Chronic hepatitis (HCC)       Referral placed for treatment with GI.   Relevant Orders   Ambulatory referral to Gastroenterology        Follow up plan: Return in about 3 months (around 01/15/2024) for HTN, HLD, DM2 FU.   LABORATORY TESTING:  - Pap smear: up to date  IMMUNIZATIONS:   - Tdap: Tetanus vaccination status reviewed: last tetanus booster within 10 years. - Influenza: Postponed to flu season - Pneumovax: Up to date -  Prevnar: Up to date - COVID: Not applicable - HPV: Not applicable - Shingrix vaccine: Discussed at visit today  SCREENING: -Mammogram: Not applicable  - Colonoscopy: Not applicable  - Bone Density: Up to date  -Hearing Test: Not applicable  -Spirometry: Not applicable   PATIENT COUNSELING:   Advised to take 1 mg of folate supplement per day if capable of pregnancy.   Sexuality: Discussed sexually transmitted diseases, partner selection, use of condoms, avoidance of unintended pregnancy  and contraceptive alternatives.   Advised to avoid cigarette smoking.  I discussed with the patient that most people either abstain from alcohol or drink within safe limits (<=14/week and <=4 drinks/occasion for males, <=7/weeks and <= 3 drinks/occasion for females) and that the risk for alcohol disorders and other health effects rises proportionally with the number of drinks per week and how often a drinker exceeds daily limits.  Discussed cessation/primary prevention of drug use and availability of treatment for abuse.   Diet: Encouraged to  adjust caloric intake to maintain  or achieve ideal body weight, to reduce intake of dietary saturated fat and total fat, to limit sodium intake by avoiding high sodium foods and not adding table salt, and to maintain adequate dietary potassium and calcium  preferably from fresh fruits, vegetables, and low-fat dairy products.    stressed the importance of regular exercise  Injury prevention: Discussed safety belts, safety helmets, smoke detector, smoking near bedding or upholstery.   Dental health: Discussed importance of regular tooth brushing, flossing, and dental visits.    NEXT PREVENTATIVE PHYSICAL DUE IN 1 YEAR. Return in about 3 months (around 01/15/2024) for HTN, HLD, DM2 FU.

## 2023-10-15 NOTE — Assessment & Plan Note (Signed)
 Chronic.  Controlled.  Continue with current medication regimen.  Labs ordered today.  Return to clinic in 6 months for reevaluation.  Call sooner if concerns arise.  ? ?

## 2023-10-15 NOTE — Assessment & Plan Note (Signed)
 Chronic.  Endorses taking Metformin  500mg  BID.  Last A1c was 5.4%.  Microalbumin checked.  Does see [facility]- will request eye exam.  Labs ordered today. Will follow up in 3 month.  Call sooner if concerns aries.

## 2023-10-15 NOTE — Assessment & Plan Note (Signed)
 Labs ordered at visit today.  Will make recommendations based on lab results.  Continue with Simvastatin .

## 2023-10-15 NOTE — Assessment & Plan Note (Signed)
 Chronic. Not currently seeing Cardiology.  Last saw Dr. Bary Likes in 2021.  No recent ECHO imaging available to review in EPIC.  Referral placed for Cardiology.

## 2023-10-15 NOTE — Assessment & Plan Note (Signed)
 Followed by Oncology.  Will have Port removed on July 1.

## 2023-10-15 NOTE — Assessment & Plan Note (Signed)
 Chronic.  Need to keep blood pressure well controlled.  Would benefit from SGLT2.  Labs ordered at visit today.  Will make recommendations based on lab results.

## 2023-10-16 ENCOUNTER — Ambulatory Visit: Payer: Self-pay | Admitting: Nurse Practitioner

## 2023-10-16 DIAGNOSIS — R7989 Other specified abnormal findings of blood chemistry: Secondary | ICD-10-CM

## 2023-10-16 LAB — LIPID PANEL
Chol/HDL Ratio: 2.9 ratio (ref 0.0–4.4)
Cholesterol, Total: 196 mg/dL (ref 100–199)
HDL: 68 mg/dL (ref >39)
LDL Chol Calc (NIH): 114 mg/dL — ABNORMAL HIGH (ref 0–99)
Triglycerides: 76 mg/dL (ref 0–149)
VLDL Cholesterol Cal: 14 mg/dL (ref 5–40)

## 2023-10-16 LAB — CBC WITH DIFFERENTIAL/PLATELET
Basophils Absolute: 0 10*3/uL (ref 0.0–0.2)
Basos: 0 %
EOS (ABSOLUTE): 0.6 10*3/uL — ABNORMAL HIGH (ref 0.0–0.4)
Eos: 8 %
Hematocrit: 35.9 % (ref 34.0–46.6)
Hemoglobin: 11.6 g/dL (ref 11.1–15.9)
Immature Grans (Abs): 0 10*3/uL (ref 0.0–0.1)
Immature Granulocytes: 0 %
Lymphocytes Absolute: 1.1 10*3/uL (ref 0.7–3.1)
Lymphs: 15 %
MCH: 32.4 pg (ref 26.6–33.0)
MCHC: 32.3 g/dL (ref 31.5–35.7)
MCV: 100 fL — ABNORMAL HIGH (ref 79–97)
Monocytes Absolute: 0.6 10*3/uL (ref 0.1–0.9)
Monocytes: 7 %
Neutrophils Absolute: 5.3 10*3/uL (ref 1.4–7.0)
Neutrophils: 70 %
Platelets: 343 10*3/uL (ref 150–450)
RBC: 3.58 x10E6/uL — ABNORMAL LOW (ref 3.77–5.28)
RDW: 13.4 % (ref 11.7–15.4)
WBC: 7.6 10*3/uL (ref 3.4–10.8)

## 2023-10-16 LAB — COMPREHENSIVE METABOLIC PANEL WITH GFR
ALT: 14 IU/L (ref 0–32)
AST: 20 IU/L (ref 0–40)
Albumin: 4.1 g/dL (ref 3.8–4.8)
Alkaline Phosphatase: 73 IU/L (ref 44–121)
BUN/Creatinine Ratio: 9 — ABNORMAL LOW (ref 12–28)
BUN: 18 mg/dL (ref 8–27)
Bilirubin Total: 0.2 mg/dL (ref 0.0–1.2)
CO2: 21 mmol/L (ref 20–29)
Calcium: 9.2 mg/dL (ref 8.7–10.3)
Chloride: 102 mmol/L (ref 96–106)
Creatinine, Ser: 1.91 mg/dL — ABNORMAL HIGH (ref 0.57–1.00)
Globulin, Total: 2.7 g/dL (ref 1.5–4.5)
Glucose: 84 mg/dL (ref 70–99)
Potassium: 4.4 mmol/L (ref 3.5–5.2)
Sodium: 141 mmol/L (ref 134–144)
Total Protein: 6.8 g/dL (ref 6.0–8.5)
eGFR: 27 mL/min/{1.73_m2} — ABNORMAL LOW (ref >59)

## 2023-10-16 LAB — T4: T4, Total: 6.8 ug/dL (ref 4.5–12.0)

## 2023-10-16 LAB — TSH: TSH: 28 u[IU]/mL — ABNORMAL HIGH (ref 0.450–4.500)

## 2023-10-21 ENCOUNTER — Encounter: Payer: Self-pay | Admitting: Oncology

## 2023-10-21 ENCOUNTER — Inpatient Hospital Stay

## 2023-10-21 ENCOUNTER — Inpatient Hospital Stay: Attending: Oncology | Admitting: Oncology

## 2023-10-21 VITALS — BP 141/111 | HR 94 | Temp 99.0°F | Resp 18 | Wt 146.4 lb

## 2023-10-21 DIAGNOSIS — Z85850 Personal history of malignant neoplasm of thyroid: Secondary | ICD-10-CM | POA: Insufficient documentation

## 2023-10-21 DIAGNOSIS — C73 Malignant neoplasm of thyroid gland: Secondary | ICD-10-CM | POA: Diagnosis not present

## 2023-10-21 DIAGNOSIS — Z9221 Personal history of antineoplastic chemotherapy: Secondary | ICD-10-CM | POA: Diagnosis not present

## 2023-10-21 DIAGNOSIS — Z79890 Hormone replacement therapy (postmenopausal): Secondary | ICD-10-CM | POA: Diagnosis not present

## 2023-10-21 DIAGNOSIS — Z803 Family history of malignant neoplasm of breast: Secondary | ICD-10-CM | POA: Insufficient documentation

## 2023-10-21 DIAGNOSIS — F17210 Nicotine dependence, cigarettes, uncomplicated: Secondary | ICD-10-CM | POA: Diagnosis not present

## 2023-10-21 DIAGNOSIS — Z08 Encounter for follow-up examination after completed treatment for malignant neoplasm: Secondary | ICD-10-CM | POA: Insufficient documentation

## 2023-10-21 DIAGNOSIS — C539 Malignant neoplasm of cervix uteri, unspecified: Secondary | ICD-10-CM

## 2023-10-21 DIAGNOSIS — Z923 Personal history of irradiation: Secondary | ICD-10-CM | POA: Diagnosis not present

## 2023-10-21 DIAGNOSIS — E041 Nontoxic single thyroid nodule: Secondary | ICD-10-CM

## 2023-10-21 DIAGNOSIS — E890 Postprocedural hypothyroidism: Secondary | ICD-10-CM | POA: Insufficient documentation

## 2023-10-21 DIAGNOSIS — R5383 Other fatigue: Secondary | ICD-10-CM | POA: Insufficient documentation

## 2023-10-21 DIAGNOSIS — Z8541 Personal history of malignant neoplasm of cervix uteri: Secondary | ICD-10-CM | POA: Diagnosis present

## 2023-10-21 NOTE — Progress Notes (Signed)
 Hematology/Oncology Progress note [phone] [phone]      Patient Care Team: Melvin Pao, NP as PCP - General (Nurse Practitioner) Maurie Rayfield BIRCH, RN as Oncology Nurse Navigator Babara Call, MD as Consulting Physician (Oncology) Babara Call, MD as Consulting Physician (Hematology and Oncology) Lenn Aran, MD as Consulting Physician (Radiation Oncology) Dasie Tinnie MATSU, NP as Nurse Practitioner (Nurse Practitioner) Mancil Barter, MD as Referring Physician (Obstetrics)  REFERRING PROVIDER: Melvin Pao, NP  CHIEF COMPLAINTS/REASON FOR VISIT:  Follow up for treatment of cervical cancer   ASSESSMENT & PLAN:   Invasive carcinoma of cervix [facility]) Not a surgical candidate. Currently on concurrent chemotherapy and radiation.  S/p concurrent chemotherapy radiation  Clinically she is doing well.  Follow up with Gynonc for surveillance.  Thyroid  cancer [facility]) Had a lengthy discussion with the patient.  I highly recommend patient to reestablish care with Dr.Frieze, Krystal Fallow for management of thyroid  cancer surveillance.  Patient declined. Recommend to check ultrasound thyroid /soft tissue neck. TSH and T4 were recently checked by primary care provider. Check thyroglobulin level. I referred patient to establish care with endocrinology Dr. Damian.  She was seen by Dr. Damian in 2016 for diabetes.  Post-surgical hypothyroidism Recent TSH shows increased value of 28.  Unclear patient's compliance of levothyroxine . Refer patient to establish care with endocrinology.  Orders Placed This Encounter  Procedures   US  THYROID     Standing Status:   Future    Expected Date:   10/28/2023    Expiration Date:   10/20/2024    Reason for Exam (SYMPTOM  OR DIAGNOSIS REQUIRED):   thyroid  cancer follow up    Preferred imaging location?:   [facility]   Thyroglobulin Level    Standing Status:   Future    Number of Occurrences:   1    Expected Date:    10/21/2023    Expiration Date:   10/20/2024   Ambulatory referral to Endocrinology    Referral Priority:   Routine    Referral Type:   Consultation    Referral Reason:   Specialty Services Required    Number of Visits Requested:   1   Follow-up in 6 months All questions were answered. The patient knows to call the clinic with any problems, questions or concerns.  Call Babara, MD, PhD [facility] Hematology Oncology 10/21/2023   HISTORY OF PRESENTING ILLNESS:   Oncology History  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HCC)  12/23/2020 Initial Diagnosis   Invasive carcinoma of cervix 12/01/2020-8/9/022 She was admitted from the ED for 1 day h/o right lower abdominal / pelvic pain . 12/01/2020 CT abdomen pelvis w contrast showed  thickly septated right ovarian mass measuring 5.1 x 5.1 cm. 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 Recommend initial pelvic ultrasound and likely subsequent MRI to further evaluate, which may be performed on a nonemergent basis. 2. Air within the fundal endometrial cavity. Correlate for recent instrumentation. 3. Pancolonic diverticulosis without evidence of acute diverticulitis.   12/01/2020 Fx D+C  and cervical bx. Uterus sounded to 9 cm.    TVUS POD#1 showed a complex right ovarian cyst with normal doppler flow Pathology- squamous cell CA of the cervix, as well as cancer in endocervix curettage and endometrial curettings    Patient was initially treatment for urosepsis, urine culture came back negative She was treated with IV antibiotics and transitioned to Augmentin  and Flagyl .    Diastolic CHF and Diabetes.  UDS + for cocaine.    She is the oldest of  7 kids (5 girls and 2 boys). She has 3 children - all SVDs - 2 boys and one girl.   She was seen by GynOnc Dr.Secord.  Her pelvic examination showed On palpations suspect lateral vaginal disease >50% down the vault and disease involving the left fornix. Cervix enlarged >4 cm with grossly obvious tumor and hard to  palpation. Uterus - may be enlarged with firm mass on the right aspect versus palpation of the right adnexal mass. Positive parametrial involvement on the right and on the left with disease to or almost to the sidewall on the left. Rectovaginal exam was confirmatory.     # 8/31 2022, PET scan showed signs of cervical cancer with diffuse involvement of the uterus.  Cystic and solid right ovarian lesion more likely related to diffuse cervical cancer.  Concomitant synchronous cystic ovarian neoplasm could have appearance but feel less likely given the diffuse nature of disease throughout the uterus and cervix.  Small lymph nodes in the pelvis in the left pelvis suspicious for nodal involvement at the common iliac level.  No solid organ distant metastasis. Right lung renal consult and glossotonsillar sulcus uptake which is asymmetric and with increased fullness.  While this may be physiologic, will suggest direct visualization for further evaluation to exclude neoplasm. Right thyroid  uptake with visible nodule.  Recommend ultrasound thyroid  and biopsy.   Patient has had baseline audiogram done and chemotherapy class. 01/03/2021 cisplatin  20 mg/m2 today.  Dose were reduced to 50% due to her impaired kidney function.  Treatment plan was switched to carboplatin  given her decreased kidney function 01/10/2021 carboplatin . Kidney function improved. 01/17/2021 cisplatin .  02/01/2021 cisplatin  02/08/2021 cisplatin  Last radiation 02/08/2021.   PD-L1 CPS 20%   01/17/2021 - 02/08/2021, concurrent chemoradiation to cervical cancer. S/p intracavitary radiation at [facility] with Dr. Lurline.  Patient follows up with gynecology oncology Dr. Elby for surveillance.   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 Background: There is not a family history of thyroid  disease. There is not a history of x-ray exposure to the head or neck area. There is not a history of prior nuclear radiation exposure. Baseline thyroid /neck mass symptoms include anterior neck swelling, but not anterior neck pain, anterior neck tenderness, difficulty swallowing/dysphagia, painful swallowing/odynophagia, globus, hoarseness, voice change/dysphonia, cough, positional dyspnea, and persistent anterior cervical lymphadenopathy. Oncologic history: 03/09/14 US  Thyroid  + Neck @ [facility] with right nodule at 3.5x2.5x3.3-cm, left mid-lobe/lateral nodule at 2.1x1.1x1.4-cm, left mid-lobe/medial nodule at 0.8x0.7x0.8-cm, left mid-inferior nodule at 0.7x0.6x0.6-cm, and left inferior pole nodule at 0.9x0.9x1.2-cm and no nodal findings  04/25/14 US -guided Thyroid  biopsies of right mid-lobe 3.5-cm nodule and left mid-lobe/lateral nodule at 2.1-cm @ [facility] by Dr Therisa Eleanor Leep with benign cytology / Bethesda II for both nodules through [facility]  11/17/14 US  Thyroid  + Neck @ [facility] with right dominant nodule at 3.6x2.3x2.8-cm with adjacent nodules at 0.9x0.5x0.7-cm and 0.9x0.5x0.8-cm, left mid-lobe/lateral nodule at 1.8x1.1x1.5-cm, left mid-lobe/medial nodule at 1.1x0.7x0.8-cm, left mid-inferior nodule at 0.7x0.6x0.6-cm, and left inferior nodule at 1.1x0.x1.0-cm and no nodal  findings  +12/27/20 FDG PET thru [facility] reporting hypermetabolic right thyroid  nodule at 2.8-cm with SUV max of 5.9 with nodularity of left lobe, asymmetric fullness of right glossotonsillar sulcus with SUV max of 7.6, no cervical lymphadenopathy, no pulmonary nodules, and multiple sites of abdominal and pelvic  activity  +02/20/21 US  Thyroid  @ [facility]) reporting right mid-lobe 4.5x0.9x3.3-cm nodule with TIRADS 4/4, right inferior 1.0x0.7x0.9-cm nodule with TIRADS 3/3, 2nd right inferior 1.1x1.0x0.9-cm nodule with TIRADS 3/3, left mid-lobe 1.3x1.0x0.7-cm nodule with TIRADS 4/4, 2nd left mid-lobe 0.9x0.7x0.7-cm nodule with TIRADS 4/4, and left inferior 1.2x1.0x1.0-cm nodule with TIRADS 3/3  +03/05/21 US -guided biopsy of right mid-lobe thyroid  nodule @ [facility]) with indeterminate cytology / Bethesda IV as Suspicious for Follicular Neoplasm (SFN) with subsequent molecular testing thru [facility] using ThyroSeq V3 GC positive for HRAS Q61R and TERT mutations thus high (>95) probability of cancer  +05/09/21 FDG PET thru [facility] with persistent hypermetabolic right thyroid  nodule with SUV max of 5.0, left thyroid  nodularity without PET correlation, no cervical lymphadenopathy, and improved abdominopelvic findings  +09/17/21 CT Chest/Abdomen/Pelvis with contrast thru [facility] reporting persistent right thyroid  nodule at 2.8-cm, and new findings of right lower lobe lung nodule at 0.5-cm and left lingular lung nodule at 0.3-cm  10/29/21  Physician-performed US  Thyroid  + Neck @ [facility] by Dr Toribio Novel with suspect nodular disease without suggestion of extrathyroidal extension or suspect cervical lymphadenopathy Flexible laryngoscopy @ [facility] by Dr Toribio Novel with normal vocal cord motion  12/18/21 Total thyroidectomy @ [facility] by Dr Toribio Novel with surgical pathology showing: 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) at 4.8x2.9x2.6-cm with no mitoses, no tumor necrosis, negative margins, no vascular invasion (-VI), no lymphatic invasion (-LI), no perineural invasion (-PNI), no extrathyroidal extension (-ETE), and no lymph nodes thus AJCC TNM staging of [id number] with additional findings of benign adenomatoid nodule(s) or  nodular follicular disease and mild chronic lymphocytic thyroiditis  Multifocal left-sided Papillary Thyroid  Carcinoma (PTC) at 0.9-cm and 0.8-cm and 0.1-cm with no mitoses, no tumor necrosis, no vascular invasion (-VI), no lymphatic invasion (-LI), no perineural invasion (-PNI), no extrathyroidal extension (-ETE), and no lymph nodes thus AJCC TNM staging of [id number]   +01/09/22 CT Chest/Abdomen/Pelvis with contrast thru [facility] reporting no thyroid , tracheal, or esophageal findings; no mediastinal or hilar lymphadenopathy, stable right lower lobe lung ground-glass nodule at 0.3-cm, stable left upper lobe lung nodule at 0.3-cm, and stable left lower lobe lung nodule at 0.4-cm       Patient presents to reestablish care.  Referred by Dr. Elby as she has not been compliant following up with endocrinology oncology for history of thyroid  cancer. Patient expresses frustration of multiple doctor visits.  She was last seen by Dr.Frieze, Krystal Fallow in October 2024.  She has lost follow-up since then due to lack of transportation as well as per patient, she has not received phone calls from [facility] to set up follow-up appointments. She reports feeling well.  Denies any new concerns.  Review of Systems  Constitutional:  Positive for fatigue. Negative for appetite change, chills and fever.  HENT:   Negative for hearing loss and voice change.   Eyes:  Negative for eye problems.  Respiratory:  Negative for chest tightness and cough.   Cardiovascular:  Negative for chest pain.  Gastrointestinal:  Negative for abdominal distention, abdominal pain and blood in stool.  Endocrine: Negative for hot flashes.  Genitourinary:  Negative for difficulty urinating and  frequency.   Musculoskeletal:  Negative for arthralgias.       Chronic intermitted left leg pain  Skin:  Negative for itching and rash.  Neurological:  Positive for numbness. Negative for extremity weakness and light-headedness.  Hematological:   Negative for adenopathy.  Psychiatric/Behavioral:  Negative for confusion.     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, cN1, cM0); Tx'd with Cisplatin  + EBRT   Chest pain, unspecified    CHF (congestive heart failure) (HCC)    Chronic diastolic heart failure, NYHA class 2 (HCC)    Cocaine use    none since Cancer dx (11/2021)   Diabetes mellitus without complication (HCC)    Edentulous    lost dentures   Grade I diastolic dysfunction    Hip pain    HLD (hyperlipidemia)    Hyperplastic colon polyp    Hypertension    Hypothyroidism    Multinodular goiter    Port-A-Cath in place    Thyroid  cancer (HCC)    Thyroid  mass    a.) 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)   Tubular adenoma of colon     SURGICAL HISTORY: Past Surgical History:  Procedure Laterality Date   ABDOMINAL HYSTERECTOMY     CATARACT EXTRACTION W/PHACO Left 08/14/2023   Procedure: PHACOEMULSIFICATION, CATARACT, WITH IOL INSERTION  3.93  00:31.6;  Surgeon: Enola Feliciano Hugger, MD;  Location: [facility];  Service: Ophthalmology;  Laterality: Left;   CATARACT EXTRACTION W/PHACO Right 08/28/2023   Procedure: PHACOEMULSIFICATION, CATARACT, WITH IOL INSERTION 2.67 00:21.9;  Surgeon: Enola Feliciano Hugger, MD;  Location: [facility];  Service: Ophthalmology;  Laterality: Right;   COLONOSCOPY WITH PROPOFOL  N/A 11/24/2015   Procedure: COLONOSCOPY WITH PROPOFOL ;  Surgeon: Gladis RAYMOND Mariner, MD;  Location: [facility] END[facility];  Service: Endoscopy;  Laterality: N/A;   COLONOSCOPY WITH PROPOFOL  N/A 03/18/2019   Procedure: COLONOSCOPY WITH PROPOFOL ;  Surgeon: Janalyn Keene NOVAK, MD;  Location: [facility] ENDOSCOPY;  Service: Endoscopy;  Laterality: N/A;   DILATION AND CURETTAGE OF UTERUS N/A 12/01/2020   Procedure: DILATATION AND  CURETTAGE with cervical biopsies;  Surgeon: Schermerhorn, Debby PARAS, MD;  Location: [facility] ORS;  Service: Gynecology;  Laterality: N/A;   ESOPHAGOGASTRODUODENOSCOPY (EGD) WITH PROPOFOL  N/A 03/18/2019   Procedure: ESOPHAGOGASTRODUODENOSCOPY (EGD) WITH PROPOFOL ;  Surgeon: Janalyn Keene NOVAK, MD;  Location: [facility] ENDOSCOPY;  Service: Endoscopy;  Laterality: N/A;   PORTA CATH INSERTION N/A 01/16/2021   Procedure: PORTA CATH INSERTION;  Surgeon: Jama Cordella MATSU, MD;  Location: [facility] INVASIVE CV LAB;  Service: Cardiovascular;  Laterality: N/A;   right ankle orif     THYROIDECTOMY, COMPLETION Bilateral 12/18/2021    SOCIAL HISTORY: Social History   Socioeconomic History   Marital status: Legally Separated    Spouse name: Not on file   Number of children: Not on file   Years of education: Not on file   Highest education level: Not on file  Occupational History   Not on file  Tobacco Use   Smoking status: Every Day    Current packs/day: 0.10    Average packs/day: 0.1 packs/day for 59.5 years (5.9 ttl pk-yrs)    Types: Cigarettes    Start date: 04/29/1964    Passive exposure: Never   Smokeless tobacco: Never  Vaping Use   Vaping status: Never Used  Substance and Sexual Activity   Alcohol use: Not Currently   Drug use: Yes    Types: Crack cocaine    Comment: some times   Sexual activity: Not Currently  Other Topics Concern   Not on file  Social History Narrative   Not on file   Social Drivers of Health   Financial Resource Strain: Not on file  Food Insecurity: No Food Insecurity (10/15/2023)   Hunger Vital Sign    Worried About Running Out of Food in the Last Year: Never true    Ran Out of Food in the Last Year: Never true  Transportation Needs: No Transportation Needs (10/15/2023)   PRAPARE - Administrator, Civil Service (Medical): No    Lack of Transportation (Non-Medical): No  Physical Activity: Not on file  Stress: Not on file  Social Connections: Not on file   Intimate Partner Violence: Not on file    FAMILY HISTORY: Family History  Problem Relation Age of Onset   Breast cancer Maternal Aunt    Breast cancer Paternal Aunt     ALLERGIES:  is allergic to oxybutynin , pregabalin , ace inhibitors, and cyclobenzaprine.  MEDICATIONS:  Current Outpatient Medications  Medication Sig Dispense Refill   aspirin EC 81 MG tablet Take 81 mg by mouth daily.     calcitRIOL  (ROCALTROL ) 0.25 MCG capsule Take 1 capsule (0.25 mcg total) by mouth daily. 90 capsule 0   Calcium  Carbonate-Vitamin D 500-125 MG-UNIT TABS Take 1 tablet by mouth daily.     Ferrous Sulfate  (IRON ) 325 (65 Fe) MG TABS Take 1 tablet (325 mg total) by mouth 2 (two) times daily. 180 tablet 0   hydrOXYzine  (ATARAX ) 25 MG tablet Take 1 tablet (25 mg total) by mouth daily. Take 25mg . By mouth every morning 90 tablet 0   levothyroxine  (SYNTHROID ) 112 MCG tablet Take 1 tablet (112 mcg total) by mouth daily. 90 tablet 0   losartan  (COZAAR ) 50 MG tablet Take 1 tablet (50 mg total) by mouth 2 (two) times daily. 180 tablet 0   metFORMIN  (GLUCOPHAGE ) 500 MG tablet Take 1 tablet (500 mg total) by mouth 2 (two) times daily with a meal. 180 tablet 0   mirtazapine  (REMERON ) 15 MG tablet Take 1 tablet (15 mg total) by mouth at bedtime. 90 tablet 0   ondansetron  (ZOFRAN ) 4 MG tablet Take 1 tablet (4 mg total) by mouth every 6 (six) hours as needed for nausea. 20 tablet 0   simvastatin  (ZOCOR ) 20 MG tablet Take 1 tablet (20 mg total) by mouth daily. 90 tablet 0   No current facility-administered medications for this visit.     PHYSICAL EXAMINATION: ECOG PERFORMANCE STATUS: 1 - Symptomatic but completely ambulatory Vitals:   10/21/23 1408 10/21/23 1423  BP: (!) 146/104 (!) 141/111  Pulse: 94   Resp: 18   Temp: 99 F (37.2 C)   SpO2: 100%    Filed Weights   10/21/23 1408  Weight: 146 lb 6.4 oz (66.4 kg)    Physical Exam Constitutional:      General: She is not in acute distress.    Comments:  She is able to ambulate with a walker  HENT:     Head: Normocephalic and atraumatic.   Eyes:     General: No scleral icterus.   Cardiovascular:     Rate and Rhythm: Normal rate and regular rhythm.     Heart sounds: Normal heart sounds.  Pulmonary:     Effort: Pulmonary effort is normal.  No respiratory distress.     Breath sounds: No wheezing.  Abdominal:     General: Bowel sounds are normal. There is no distension.     Palpations: Abdomen is soft.   Musculoskeletal:        General: No deformity. Normal range of motion.     Cervical back: Normal range of motion and neck supple.   Skin:    General: Skin is warm and dry.     Findings: No erythema or rash.   Neurological:     Mental Status: She is alert and oriented to person, place, and time. Mental status is at baseline.     Cranial Nerves: No cranial nerve deficit.     Coordination: Coordination normal.   Psychiatric:        Mood and Affect: Mood normal.     LABORATORY DATA:  I have reviewed the data as listed Lab Results  Component Value Date   WBC 7.6 10/15/2023   HGB 11.6 10/15/2023   HCT 35.9 10/15/2023   MCV 100 (H) 10/15/2023   PLT 343 10/15/2023   Recent Labs    08/02/23 1114 09/10/23 0954 10/15/23 1016  NA 140 143 141  K 4.8 4.9 4.4  CL 105 104 102  CO2 23 21 21   GLUCOSE 74 87 84  BUN 22 18 18   CREATININE 1.79* 1.84* 1.91*  CALCIUM  6.8* 9.0 9.2  GFRNONAA 29*  --   --   PROT 7.2 6.3 6.8  ALBUMIN 3.6 4.1 4.1  AST 19 16 20   ALT 12 11 14   ALKPHOS 59 78 73  BILITOT 0.4 <0.2 0.2        RADIOGRAPHIC STUDIES: I have personally reviewed the radiological images as listed and agreed with the findings in the report. No results found.

## 2023-10-21 NOTE — Assessment & Plan Note (Signed)
 Had a lengthy discussion with the patient.  I highly recommend patient to reestablish care with Dr.Frieze, Krystal Fallow for management of thyroid  cancer surveillance.  Patient declined. Recommend to check ultrasound thyroid /soft tissue neck. TSH and T4 were recently checked by primary care provider. Check thyroglobulin level. I referred patient to establish care with endocrinology Dr. Damian.  She was seen by Dr. Damian in 2016 for diabetes.

## 2023-10-21 NOTE — Assessment & Plan Note (Signed)
 Not a surgical candidate. Currently on concurrent chemotherapy and radiation.  S/p concurrent chemotherapy radiation  Clinically she is doing well.  Follow up with Gynonc for surveillance.

## 2023-10-22 ENCOUNTER — Telehealth: Payer: Self-pay

## 2023-10-22 ENCOUNTER — Encounter: Payer: Self-pay | Admitting: Oncology

## 2023-10-22 NOTE — Telephone Encounter (Signed)
 Referral faxed to [facility]. Re: thyroid  nodule. Fax confirmation received.   Ph: [phone] fax: [phone]

## 2023-10-22 NOTE — Assessment & Plan Note (Signed)
 Recent TSH shows increased value of 28.  Unclear patient's compliance of levothyroxine . Refer patient to establish care with endocrinology.

## 2023-10-28 ENCOUNTER — Ambulatory Visit

## 2023-10-28 ENCOUNTER — Encounter: Payer: Self-pay | Admitting: Vascular Surgery

## 2023-10-28 ENCOUNTER — Other Ambulatory Visit: Payer: Self-pay

## 2023-10-28 ENCOUNTER — Encounter
Admission: RE | Disposition: A | Discharge status: Home / Self Care | Payer: Self-pay | Source: Home / Self Care | Attending: Vascular Surgery

## 2023-10-28 ENCOUNTER — Ambulatory Visit
Admission: RE | Admit: 2023-10-28 | Discharge: 2023-10-28 | Disposition: A | Discharge status: Home / Self Care | Attending: Vascular Surgery | Admitting: Vascular Surgery

## 2023-10-28 DIAGNOSIS — Z452 Encounter for adjustment and management of vascular access device: Secondary | ICD-10-CM

## 2023-10-28 DIAGNOSIS — C539 Malignant neoplasm of cervix uteri, unspecified: Secondary | ICD-10-CM | POA: Diagnosis present

## 2023-10-28 DIAGNOSIS — F17210 Nicotine dependence, cigarettes, uncomplicated: Secondary | ICD-10-CM | POA: Insufficient documentation

## 2023-10-28 HISTORY — PX: PORTA CATH REMOVAL: CATH118286

## 2023-10-28 LAB — GLUCOSE, CAPILLARY
Glucose-Capillary: 94 mg/dL (ref 70–99)
Glucose-Capillary: 90 mg/dL (ref 70–99)

## 2023-10-28 SURGERY — PORTA CATH REMOVAL
Anesthesia: Moderate Sedation

## 2023-10-28 MED ORDER — FAMOTIDINE 20 MG PO TABS: 40.0000 mg | ORAL_TABLET | Freq: Once | ORAL | Status: DC | PRN

## 2023-10-28 MED ORDER — HEPARIN (PORCINE) IN NACL 1000-0.9 UT/500ML-% IV SOLN
INTRAVENOUS | Status: DC | PRN
  Administered 2023-10-28: 500 mL

## 2023-10-28 MED ORDER — METHYLPREDNISOLONE SODIUM SUCC 125 MG IJ SOLR: 125.0000 mg | Freq: Once | INTRAMUSCULAR | Status: DC | PRN

## 2023-10-28 MED ORDER — CEFAZOLIN SODIUM-DEXTROSE 2-4 GM/100ML-% IV SOLN: 2.0000 g | INTRAVENOUS | Status: DC

## 2023-10-28 MED ORDER — CEFAZOLIN SODIUM-DEXTROSE 1-4 GM/50ML-% IV SOLN
INTRAVENOUS | Status: AC
  Filled 2023-10-28: qty 50

## 2023-10-28 MED ORDER — DIPHENHYDRAMINE HCL 50 MG/ML IJ SOLN: 50.0000 mg | Freq: Once | INTRAMUSCULAR | Status: DC | PRN

## 2023-10-28 MED ORDER — HYDROMORPHONE HCL 1 MG/ML IJ SOLN: 1.0000 mg | Freq: Once | INTRAMUSCULAR | Status: DC | PRN

## 2023-10-28 MED ORDER — MIDAZOLAM HCL 2 MG/2ML IJ SOLN
INTRAMUSCULAR | Status: AC
  Filled 2023-10-28: qty 2

## 2023-10-28 MED ORDER — CEFAZOLIN SODIUM-DEXTROSE 1-4 GM/50ML-% IV SOLN
1.0000 g | INTRAVENOUS | Status: AC
  Administered 2023-10-28: 1 g via INTRAVENOUS

## 2023-10-28 MED ORDER — MIDAZOLAM HCL 2 MG/2ML IJ SOLN
INTRAMUSCULAR | Status: AC
Start: 2023-10-28 — End: 2023-10-28
  Filled 2023-10-28: qty 2

## 2023-10-28 MED ORDER — SODIUM CHLORIDE 0.9 % IV SOLN: INTRAVENOUS | Status: DC

## 2023-10-28 MED ORDER — MIDAZOLAM HCL 2 MG/ML PO SYRP: 8.0000 mg | ORAL_SOLUTION | Freq: Once | ORAL | Status: DC | PRN

## 2023-10-28 MED ORDER — CEFAZOLIN SODIUM-DEXTROSE 2-4 GM/100ML-% IV SOLN
INTRAVENOUS | Status: AC
  Filled 2023-10-28: qty 100

## 2023-10-28 MED ORDER — ONDANSETRON HCL 4 MG/2ML IJ SOLN: 4.0000 mg | Freq: Four times a day (QID) | INTRAMUSCULAR | Status: DC | PRN

## 2023-10-28 MED ORDER — LIDOCAINE-EPINEPHRINE (PF) 1 %-1:200000 IJ SOLN
INTRAMUSCULAR | Status: DC | PRN
  Administered 2023-10-28: 20 mL via INTRADERMAL

## 2023-10-28 MED ORDER — FENTANYL CITRATE (PF) 100 MCG/2ML IJ SOLN
INTRAMUSCULAR | Status: AC
  Filled 2023-10-28: qty 2

## 2023-10-28 MED ORDER — MIDAZOLAM HCL 2 MG/2ML IJ SOLN
INTRAMUSCULAR | Status: DC | PRN
  Administered 2023-10-28: 2 mg via INTRAVENOUS
  Administered 2023-10-28: 1 mg via INTRAVENOUS

## 2023-10-28 MED ORDER — FENTANYL CITRATE (PF) 100 MCG/2ML IJ SOLN
INTRAMUSCULAR | Status: DC | PRN
  Administered 2023-10-28: 50 ug via INTRAVENOUS
  Administered 2023-10-28: 25 ug via INTRAVENOUS

## 2023-10-28 SURGICAL SUPPLY — 4 items
DERMABOND ADVANCED .7 DNX12 (GAUZE/BANDAGES/DRESSINGS) IMPLANT
PACK ANGIOGRAPHY (CUSTOM PROCEDURE TRAY) IMPLANT
SUT MNCRL AB 4-0 PS2 18 (SUTURE) IMPLANT
SUT VIC AB 3-0 SH 27X BRD (SUTURE) IMPLANT

## 2023-10-28 NOTE — Progress Notes (Signed)
 MRN : [id number]  Rebecca Parker is a 73 y.o. (August 17, 1950) female who presents with chief complaint of legs hurt and swell.  History of Present Illness:   Patient presents to [facility] from removal report.  She has been treated for her carcinoma of the cervix and her oncologist and is now requesting her port be removed.  She denies fever or chills.  Current Meds  Medication Sig   metFORMIN  (GLUCOPHAGE ) 500 MG tablet Take 1 tablet (500 mg total) by mouth 2 (two) times daily with a meal.    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, cN1, cM0); Tx'd with Cisplatin  + EBRT   Chest pain, unspecified    CHF (congestive heart failure) (HCC)    Chronic diastolic heart failure, NYHA class 2 (HCC)    Cocaine use    none since Cancer dx (11/2021)   Diabetes mellitus without complication (HCC)    Edentulous    lost dentures   Grade I diastolic dysfunction    Hip pain    HLD (hyperlipidemia)    Hyperplastic colon polyp    Hypertension    Hypothyroidism    Multinodular goiter    Port-A-Cath in place    Thyroid  cancer (HCC)    Thyroid  mass    a.) 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)   Tubular adenoma of colon     Past Surgical History:  Procedure Laterality Date   ABDOMINAL HYSTERECTOMY     CATARACT EXTRACTION W/PHACO Left 08/14/2023   Procedure: PHACOEMULSIFICATION, CATARACT, WITH IOL INSERTION  3.93  00:31.6;  Surgeon: Enola Feliciano Hugger, MD;  Location: [facility];  Service: Ophthalmology;  Laterality: Left;   CATARACT EXTRACTION W/PHACO Right 08/28/2023   Procedure: PHACOEMULSIFICATION, CATARACT, WITH IOL INSERTION 2.67 00:21.9;  Surgeon: Enola Feliciano Hugger, MD;  Location: [facility];  Service: Ophthalmology;  Laterality: Right;   COLONOSCOPY WITH PROPOFOL  N/A  11/24/2015   Procedure: COLONOSCOPY WITH PROPOFOL ;  Surgeon: Gladis RAYMOND Mariner, MD;  Location: [facility] ENDOSCOPY;  Service: Endoscopy;  Laterality: N/A;   COLONOSCOPY WITH PROPOFOL  N/A 03/18/2019   Procedure: COLONOSCOPY WITH PROPOFOL ;  Surgeon: Janalyn Keene NOVAK, MD;  Location: [facility] ENDOSCOPY;  Service: Endoscopy;  Laterality: N/A;   DILATION AND CURETTAGE OF UTERUS N/A 12/01/2020   Procedure: DILATATION AND CURETTAGE with cervical biopsies;  Surgeon: Schermerhorn, Debby PARAS, MD;  Location: [facility] ORS;  Service: Gynecology;  Laterality: N/A;   ESOPHAGOGASTRODUODENOSCOPY (EGD) WITH PROPOFOL  N/A 03/18/2019   Procedure: ESOPHAGOGASTRODUODENOSCOPY (EGD) WITH PROPOFOL ;  Surgeon: Janalyn Keene NOVAK, MD;  Location: [facility] ENDOSCOPY;  Service: Endoscopy;  Laterality: N/A;   PORTA CATH INSERTION N/A 01/16/2021   Procedure: PORTA CATH INSERTION;  Surgeon: Jama Cordella MATSU, MD;  Location: [facility] INVASIVE CV LAB;  Service: Cardiovascular;  Laterality: N/A;   right ankle orif     THYROIDECTOMY, COMPLETION Bilateral 12/18/2021    Social History Social History   Tobacco Use   Smoking status: Every Day    Current packs/day: 0.10    Average packs/day: 0.1 packs/day for 59.5 years (5.9 ttl pk-yrs)    Types: Cigarettes    Start date: 04/29/1964    Passive exposure: Never   Smokeless tobacco: Never  Vaping Use   Vaping status: Never Used  Substance Use Topics  Alcohol use: Not Currently   Drug use: Yes    Types: Crack cocaine    Comment: some times    Family History Family History  Problem Relation Age of Onset   Breast cancer Maternal Aunt    Breast cancer Paternal Aunt     Allergies  Allergen Reactions   Oxybutynin  Itching and Other (See Comments)   Pregabalin  Other (See Comments)    Hallucentations   Ace Inhibitors Itching    Rash   Cyclobenzaprine Itching    Rash and 'made me nervous'     REVIEW OF SYSTEMS (Negative unless checked)  Constitutional: [] Weight loss  [] Fever   [] Chills Cardiac: [] Chest pain   [] Chest pressure   [] Palpitations   [] Shortness of breath when laying flat   [] Shortness of breath with exertion. Vascular:  [] Pain in legs with walking   [] Pain in legs at rest  [] History of DVT   [] Phlebitis   [] Swelling in legs   [] Varicose veins   [] Non-healing ulcers Pulmonary:   [] Uses home oxygen   [] Productive cough   [] Hemoptysis   [] Wheeze  [] COPD   [] Asthma Neurologic:  [] Dizziness   [] Seizures   [] History of stroke   [] History of TIA  [] Aphasia   [] Vissual changes   [] Weakness or numbness in arm   [] Weakness or numbness in leg Musculoskeletal:   [] Joint swelling   [] Joint pain   [] Low back pain Hematologic:  [] Easy bruising  [] Easy bleeding   [] Hypercoagulable state   [] Anemic Gastrointestinal:  [] Diarrhea   [] Vomiting  [] Gastroesophageal reflux/heartburn   [] Difficulty swallowing. Genitourinary:  [] Chronic kidney disease   [] Difficult urination  [] Frequent urination   [] Blood in urine Skin:  [] Rashes   [] Ulcers  Psychological:  [] History of anxiety   []  History of major depression.  Physical Examination  Vitals:   10/28/23 1339 10/28/23 1341  BP: (!) 183/104 (!) 142/96  Pulse: 90 80  Resp:  16  Temp: 99.9 F (37.7 C)   TempSrc: Oral   SpO2: 99% 100%   There is no height or weight on file to calculate BMI. Gen: WD/WN, NAD Head: [place]/AT, No temporalis wasting.  Ear/Nose/Throat: Hearing grossly intact, nares w/o erythema or drainage, pinna without lesions Eyes: PER, EOMI, sclera nonicteric.  Neck: Supple, no gross masses.  No JVD.  Pulmonary:  Good air movement, no audible wheezing, no use of accessory muscles.  Cardiac: RRR, precordium not hyperdynamic. Vascular: Port present not tender not infected Vessel Right Left  Radial Palpable Palpable  Gastrointestinal: soft, non-distended. No guarding/no peritoneal signs.  Musculoskeletal: M/S 5/5 throughout.  No deformity.  Neurologic: CN 2-12 intact. Pain and light touch intact in extremities.   Symmetrical.  Speech is fluent. Motor exam as listed above. Psychiatric: Judgment intact, Mood & affect appropriate for pt's clinical situation. Dermatologic: Venous rashes no ulcers noted.  No changes consistent with cellulitis. Lymph : No lichenification or skin changes of chronic lymphedema.  CBC Lab Results  Component Value Date   WBC 7.6 10/15/2023   HGB 11.6 10/15/2023   HCT 35.9 10/15/2023   MCV 100 (H) 10/15/2023   PLT 343 10/15/2023    BMET    Component Value Date/Time   NA 141 10/15/2023 1016   NA 141 11/01/2013 1021   K 4.4 10/15/2023 1016   K 4.0 11/01/2013 1021   CL 102 10/15/2023 1016   CL 109 (H) 11/01/2013 1021   CO2 21 10/15/2023 1016   CO2 27 11/01/2013 1021   GLUCOSE 84 10/15/2023 1016  GLUCOSE 74 08/02/2023 1114   GLUCOSE 102 (H) 11/01/2013 1021   BUN 18 10/15/2023 1016   BUN 8 11/01/2013 1021   CREATININE 1.91 (H) 10/15/2023 1016   CREATININE 1.13 11/01/2013 1021   CALCIUM  9.2 10/15/2023 1016   CALCIUM  8.4 (L) 11/01/2013 1021   GFRNONAA 29 (L) 08/02/2023 1114   GFRNONAA 51 (L) 11/01/2013 1021   GFRAA 68 02/24/2019 1550   GFRAA 59 (L) 11/01/2013 1021   Estimated Creatinine Clearance: 21.7 mL/min (A) (by C-G formula based on SCr of 1.91 mg/dL (H)).  COAG Lab Results  Component Value Date   INR 1.1 12/01/2020    Radiology No results found.   Assessment/Plan Cervical cancer: Patient no longer needs her Infuse-a-Port.  Oncology has requested it be removed.  Risks and benefits have been discussed all questions were answered patient will undergo removal of her port and special procedures under conscious sedation.   [name], MD  10/28/2023 2:34 PM

## 2023-10-28 NOTE — H&P (View-Only) (Signed)
 MRN : [id number]  Rebecca Parker is a 73 y.o. (August 17, 1950) female who presents with chief complaint of legs hurt and swell.  History of Present Illness:   Patient presents to [facility] from removal report.  She has been treated for her carcinoma of the cervix and her oncologist and is now requesting her port be removed.  She denies fever or chills.  Current Meds  Medication Sig   metFORMIN  (GLUCOPHAGE ) 500 MG tablet Take 1 tablet (500 mg total) by mouth 2 (two) times daily with a meal.    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, cN1, cM0); Tx'd with Cisplatin  + EBRT   Chest pain, unspecified    CHF (congestive heart failure) (HCC)    Chronic diastolic heart failure, NYHA class 2 (HCC)    Cocaine use    none since Cancer dx (11/2021)   Diabetes mellitus without complication (HCC)    Edentulous    lost dentures   Grade I diastolic dysfunction    Hip pain    HLD (hyperlipidemia)    Hyperplastic colon polyp    Hypertension    Hypothyroidism    Multinodular goiter    Port-A-Cath in place    Thyroid  cancer (HCC)    Thyroid  mass    a.) 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)   Tubular adenoma of colon     Past Surgical History:  Procedure Laterality Date   ABDOMINAL HYSTERECTOMY     CATARACT EXTRACTION W/PHACO Left 08/14/2023   Procedure: PHACOEMULSIFICATION, CATARACT, WITH IOL INSERTION  3.93  00:31.6;  Surgeon: Enola Feliciano Hugger, MD;  Location: [facility];  Service: Ophthalmology;  Laterality: Left;   CATARACT EXTRACTION W/PHACO Right 08/28/2023   Procedure: PHACOEMULSIFICATION, CATARACT, WITH IOL INSERTION 2.67 00:21.9;  Surgeon: Enola Feliciano Hugger, MD;  Location: [facility]ERY [facility];  Service: Ophthalmology;  Laterality: Right;   COLONOSCOPY WITH PROPOFOL  N/A  11/24/2015   Procedure: COLONOSCOPY WITH PROPOFOL ;  Surgeon: Gladis RAYMOND Mariner, MD;  Location: [facility] ENDOSCOPY;  Service: Endoscopy;  Laterality: N/A;   COLONOSCOPY WITH PROPOFOL  N/A 03/18/2019   Procedure: COLONOSCOPY WITH PROPOFOL ;  Surgeon: Janalyn Keene NOVAK, MD;  Location: [facility] ENDOSCOPY;  Service: Endoscopy;  Laterality: N/A;   DILATION AND CURETTAGE OF UTERUS N/A 12/01/2020   Procedure: DILATATION AND CURETTAGE with cervical biopsies;  Surgeon: Schermerhorn, Debby PARAS, MD;  Location: [facility] ORS;  Service: Gynecology;  Laterality: N/A;   ESOPHAGOGASTRODUODENOSCOPY (EGD) WITH PROPOFOL  N/A 03/18/2019   Procedure: ESOPHAGOGASTRODUODENOSCOPY (EGD) WITH PROPOFOL ;  Surgeon: Janalyn Keene NOVAK, MD;  Location: [facility] ENDOSCOPY;  Service: Endoscopy;  Laterality: N/A;   PORTA CATH INSERTION N/A 01/16/2021   Procedure: PORTA CATH INSERTION;  Surgeon: Jama Cordella MATSU, MD;  Location: [facility] INVASIVE CV LAB;  Service: Cardiovascular;  Laterality: N/A;   right ankle orif     THYROIDECTOMY, COMPLETION Bilateral 12/18/2021    Social History Social History   Tobacco Use   Smoking status: Every Day    Current packs/day: 0.10    Average packs/day: 0.1 packs/day for 59.5 years (5.9 ttl pk-yrs)    Types: Cigarettes    Start date: 04/29/1964    Passive exposure: Never   Smokeless tobacco: Never  Vaping Use   Vaping status: Never Used  Substance Use Topics  Alcohol use: Not Currently   Drug use: Yes    Types: Crack cocaine    Comment: some times    Family History Family History  Problem Relation Age of Onset   Breast cancer Maternal Aunt    Breast cancer Paternal Aunt     Allergies  Allergen Reactions   Oxybutynin  Itching and Other (See Comments)   Pregabalin  Other (See Comments)    Hallucentations   Ace Inhibitors Itching    Rash   Cyclobenzaprine Itching    Rash and 'made me nervous'     REVIEW OF SYSTEMS (Negative unless checked)  Constitutional: [] Weight loss  [] Fever   [] Chills Cardiac: [] Chest pain   [] Chest pressure   [] Palpitations   [] Shortness of breath when laying flat   [] Shortness of breath with exertion. Vascular:  [] Pain in legs with walking   [] Pain in legs at rest  [] History of DVT   [] Phlebitis   [] Swelling in legs   [] Varicose veins   [] Non-healing ulcers Pulmonary:   [] Uses home oxygen   [] Productive cough   [] Hemoptysis   [] Wheeze  [] COPD   [] Asthma Neurologic:  [] Dizziness   [] Seizures   [] History of stroke   [] History of TIA  [] Aphasia   [] Vissual changes   [] Weakness or numbness in arm   [] Weakness or numbness in leg Musculoskeletal:   [] Joint swelling   [] Joint pain   [] Low back pain Hematologic:  [] Easy bruising  [] Easy bleeding   [] Hypercoagulable state   [] Anemic Gastrointestinal:  [] Diarrhea   [] Vomiting  [] Gastroesophageal reflux/heartburn   [] Difficulty swallowing. Genitourinary:  [] Chronic kidney disease   [] Difficult urination  [] Frequent urination   [] Blood in urine Skin:  [] Rashes   [] Ulcers  Psychological:  [] History of anxiety   []  History of major depression.  Physical Examination  Vitals:   10/28/23 1339 10/28/23 1341  BP: (!) 183/104 (!) 142/96  Pulse: 90 80  Resp:  16  Temp: 99.9 F (37.7 C)   TempSrc: Oral   SpO2: 99% 100%   There is no height or weight on file to calculate BMI. Gen: WD/WN, NAD Head: [place]/AT, No temporalis wasting.  Ear/Nose/Throat: Hearing grossly intact, nares w/o erythema or drainage, pinna without lesions Eyes: PER, EOMI, sclera nonicteric.  Neck: Supple, no gross masses.  No JVD.  Pulmonary:  Good air movement, no audible wheezing, no use of accessory muscles.  Cardiac: RRR, precordium not hyperdynamic. Vascular: Port present not tender not infected Vessel Right Left  Radial Palpable Palpable  Gastrointestinal: soft, non-distended. No guarding/no peritoneal signs.  Musculoskeletal: M/S 5/5 throughout.  No deformity.  Neurologic: CN 2-12 intact. Pain and light touch intact in extremities.   Symmetrical.  Speech is fluent. Motor exam as listed above. Psychiatric: Judgment intact, Mood & affect appropriate for pt's clinical situation. Dermatologic: Venous rashes no ulcers noted.  No changes consistent with cellulitis. Lymph : No lichenification or skin changes of chronic lymphedema.  CBC Lab Results  Component Value Date   WBC 7.6 10/15/2023   HGB 11.6 10/15/2023   HCT 35.9 10/15/2023   MCV 100 (H) 10/15/2023   PLT 343 10/15/2023    BMET    Component Value Date/Time   NA 141 10/15/2023 1016   NA 141 11/01/2013 1021   K 4.4 10/15/2023 1016   K 4.0 11/01/2013 1021   CL 102 10/15/2023 1016   CL 109 (H) 11/01/2013 1021   CO2 21 10/15/2023 1016   CO2 27 11/01/2013 1021   GLUCOSE 84 10/15/2023 1016  GLUCOSE 74 08/02/2023 1114   GLUCOSE 102 (H) 11/01/2013 1021   BUN 18 10/15/2023 1016   BUN 8 11/01/2013 1021   CREATININE 1.91 (H) 10/15/2023 1016   CREATININE 1.13 11/01/2013 1021   CALCIUM  9.2 10/15/2023 1016   CALCIUM  8.4 (L) 11/01/2013 1021   GFRNONAA 29 (L) 08/02/2023 1114   GFRNONAA 51 (L) 11/01/2013 1021   GFRAA 68 02/24/2019 1550   GFRAA 59 (L) 11/01/2013 1021   Estimated Creatinine Clearance: 21.7 mL/min (A) (by C-G formula based on SCr of 1.91 mg/dL (H)).  COAG Lab Results  Component Value Date   INR 1.1 12/01/2020    Radiology No results found.   Assessment/Plan Cervical cancer: Patient no longer needs her Infuse-a-Port.  Oncology has requested it be removed.  Risks and benefits have been discussed all questions were answered patient will undergo removal of her port and special procedures under conscious sedation.   [name], MD  10/28/2023 2:34 PM

## 2023-10-28 NOTE — Interval H&P Note (Signed)
 History and Physical Interval Note:  10/28/2023 2:36 PM  Rebecca Parker  has presented today for surgery, with the diagnosis of Porta Cath Removal    Invasive carcinoma cervix.  The various methods of treatment have been discussed with the patient and family. After consideration of risks, benefits and other options for treatment, the patient has consented to  Procedure(s): PORTA CATH REMOVAL (N/A) as a surgical intervention.  The patient's history has been reviewed, patient examined, no change in status, stable for surgery.  I have reviewed the patient's chart and labs.  Questions were answered to the patient's satisfaction.     [name]

## 2023-10-28 NOTE — Op Note (Signed)
  OPERATIVE NOTE   PROCEDURE: Removal of Infuse-a-Port  PRE-OPERATIVE DIAGNOSIS: Cervical carcinoma status posttreatment  POST-OPERATIVE DIAGNOSIS: Same  SURGEON: [name], M.D.  ANESTHESIA: Conscious sedation was administered under my direct supervision by the interventional radiology RN. IV Versed  plus fentanyl  were utilized. Continuous ECG, pulse oximetry and blood pressure was monitored throughout the entire procedure.  Conscious sedation was for a total of 27 minutes.   ESTIMATED BLOOD LOSS: Minimal   SPECIMEN(S):  Infuse-a-port intact  INDICATIONS:   Rebecca Parker is a 73 y.o. y.o. female who presents with right IJ Infuse-a-Port placed for treatment of her cervical carcinoma.  The chemotherapy has been completed and the port is no longer required. Patient is therefore undergoing removal of the port. The risks and benefits of been reviewed all questions answered patient agrees to proceed with port removal   DESCRIPTION: After obtaining full informed written consent, the patient is brought to special procedures and positioned supine.  The patient received IV antibiotics.  The patient was prepped and draped in the standard fashion appropriate time out is called.    After infiltrating 1% lidocaine  with epinephrine  into the soft tissues and skin surrounding the port the previous incisional scar is reopened with an 11 blade scalpel.  The port is slipped from the pocket and subsequently removed without difficulty otherwise intact.  Pressure is held at the base of the neck for 5 minutes, the pocket is irrigated.  The incision was closed using running 3-0 Vicryl for the subcutaneous layer and 4-0 Monocryl subcuticular to reapproximate the skin.  Dermabond was applied.  The patient tolerated the procedure well.  COMPLICATIONS: None  CONDITION: Good  [name], M.D. [facility] Office: [phone]   10/28/2023, 3:14 PM

## 2023-10-29 ENCOUNTER — Encounter: Payer: Self-pay | Admitting: Vascular Surgery

## 2023-10-29 LAB — THYROGLOBULIN LEVEL: Thyroglobulin: <2 ng/mL

## 2023-11-07 ENCOUNTER — Ambulatory Visit: Attending: Cardiology | Admitting: Cardiology

## 2023-12-02 NOTE — Telephone Encounter (Signed)
 Contacted [facility] endocrinology to follow up on referral. Per KC, pt declined scheduling.

## 2023-12-08 ENCOUNTER — Ambulatory Visit

## 2023-12-10 NOTE — Progress Notes (Signed)
 Cardiology Office Note   Date:  12/11/2023  ID:  Daniell, Paradise December 23, 1950, MRN [id number] PCP: Melvin Pao, NP  [facility] Providers Cardiologist:  Caron Poser, MD     History of Present Illness Rebecca Parker is a 73 y.o. female PMH tobacco use, HTN, hypothyroidism, HLD, HFpEF, DM2, CKD 3B, and cervical CA currently on chemo/RT who presents for further evaluation and management of chronic HFpEF.  Patient reports she is feeling quite well from a cardiovascular standpoint.  She denies any chest pain, DOE, orthopnea, LE edema, syncope, palpitations, or any other concerning symptoms.  On further review, she denies any history of any heart failure syndrome or symptoms.  Last LDL 114 09/2023  Relevant CVD History -TTE normal EF, mild LVH, mild MR, mild LA enlargement, grade 1 DD 2019 [facility] -Reported HFpEF  ROS: Pt denies any chest discomfort, jaw pain, arm pain, palpitations, syncope, presyncope, orthopnea, PND, or LE edema.  Studies Reviewed EKG Interpretation Date/Time:  Thursday December 11 2023 14:27:29 EDT Ventricular Rate:  73 PR Interval:  150 QRS Duration:  82 QT Interval:  416 QTC Calculation: 458 R Axis:   -3  Text Interpretation: Normal sinus rhythm Septal infarct (cited on or before 04-Jan-2002) Nonspecific ST and T wave abnormality When compared with ECG of 02-Aug-2023 12:27, No significant change was found Confirmed by Poser Caron [id number]) on 12/11/2023 2:30:17 PM    I have independently reviewed the patient's ECG, recent bloodwork, and prior CV studies.     Physical Exam VS:  BP (!) 142/78 (BP Location: Right Arm, Cuff Size: Normal)   Pulse 73   SpO2 98%        Wt Readings from Last 3 Encounters:  10/21/23 146 lb 6.4 oz (66.4 kg)  10/15/23 145 lb (65.8 kg)  09/24/23 143 lb 4.8 oz (65 kg)    GEN: No acute distress. NECK: No JVD; No carotid bruits. CARDIAC: RRR, no murmurs, rubs, gallops. RESPIRATORY:  Clear to auscultation. EXTREMITIES:   Warm and well-perfused. No edema.  ASSESSMENT AND PLAN Reported HFpEF Patient has a history of reported HFpEF.  However, she denies any history of any actual heart failure syndrome or heart failure symptoms.  She is euvolemic on exam today.  An echo from 2019 showed preserved EF and a delayed relaxation pattern, but no overt signs of elevated filling pressures.  Given her lack of clear echocardiographic evidence of heart failure and, more importantly, lack of any clinical heart failure syndrome, I do not believe that she actually has HFpEF.  Plan: - No HF therapies indicated currently - If she develops any symptoms compatible with heart failure, we are happy to see her back in clinic and readdress therapeutic options  HLD LDL 114 09/2023.  Given her concurrent diabetes, LDL goal should be less than 100.  Would recommend increasing simvastatin  to 40 mg daily.  However, patient reports she would prefer to keep her medications the same currently and is not interested in making any changes.  HTN Suboptimal control.  Continue losartan  100 mg daily, would recommend addition of Norvasc 5 mg daily if patient amenable.  As above, however, she would prefer not to make any medication changes today.  Given her age, tight BP control is probably not critically important anyways.  4.   Tobacco use Advised cessation efforts to help reduce long-term ASCVD risk; pre-contemplative currently. Offered cessation assistance which she declined.       Dispo: RTC prn if HF symptoms develop  Signed, Caron Poser, MD

## 2023-12-11 ENCOUNTER — Ambulatory Visit

## 2023-12-11 VITALS — BP 142/78 | HR 73

## 2023-12-11 DIAGNOSIS — E1169 Type 2 diabetes mellitus with other specified complication: Secondary | ICD-10-CM | POA: Diagnosis not present

## 2023-12-11 DIAGNOSIS — E785 Hyperlipidemia, unspecified: Secondary | ICD-10-CM

## 2023-12-11 DIAGNOSIS — Z720 Tobacco use: Secondary | ICD-10-CM | POA: Diagnosis not present

## 2023-12-11 DIAGNOSIS — E1159 Type 2 diabetes mellitus with other circulatory complications: Secondary | ICD-10-CM | POA: Diagnosis not present

## 2023-12-11 DIAGNOSIS — I5032 Chronic diastolic (congestive) heart failure: Secondary | ICD-10-CM | POA: Diagnosis not present

## 2023-12-11 DIAGNOSIS — I152 Hypertension secondary to endocrine disorders: Secondary | ICD-10-CM

## 2023-12-11 NOTE — Patient Instructions (Signed)
 Medication Instructions:   Your physician recommends that you continue on your current medications as directed. Please refer to the Current Medication list given to you today.  *If you need a refill on your cardiac medications before your next appointment, please call your pharmacy*  Lab Work:  No labs ordered today   If you have labs (blood work) drawn today and your tests are completely normal, you will receive your results only by: MyChart Message (if you have MyChart) OR A paper copy in the mail If you have any lab test that is abnormal or we need to change your treatment, we will call you to review the results.  Testing/Procedures:  No test ordered today   Follow-Up: At [facility], you and your health needs are our priority.  As part of our continuing mission to provide you with exceptional heart care, our providers are all part of one team.  This team includes your primary Cardiologist (physician) and Advanced Practice Providers or APPs (Physician Assistants and Nurse Practitioners) who all work together to provide you with the care you need, when you need it.  Your next appointment:    You  may follow up as needed  Provider:   You may see Caron Poser, MD or one of the following Advanced Practice Providers on your designated Care Team:    We recommend signing up for the patient portal called MyChart.  Sign up information is provided on this After Visit Summary.  MyChart is used to connect with patients for Virtual Visits (Telemedicine).  Patients are able to view lab/test results, encounter notes, upcoming appointments, etc.  Non-urgent messages can be sent to your provider as well.   To learn more about what you can do with MyChart, go to [email or web address].

## 2024-01-09 ENCOUNTER — Other Ambulatory Visit: Payer: Self-pay | Admitting: Nurse Practitioner

## 2024-01-09 NOTE — Telephone Encounter (Signed)
 Requested medications are due for refill today.  yes  Requested medications are on the active medications list.  yes  Last refill. 10/02/2023 90 day supply for both  Future visit scheduled.   yes  Notes to clinic.  Missing and expired labs.    Requested Prescriptions  Pending Prescriptions Disp Refills   FEROSUL 325 (65 Fe) MG tablet [Pharmacy Med Name: FERROUS SULFATE  325 MG TABLET] 180 tablet 0    Sig: Take 1 tablet (325 mg total) by mouth 2 (two) times daily.     Endocrinology:  Minerals - Iron  Supplementation Failed - 01/09/2024  5:22 PM      Failed - RBC in normal range and within 360 days    RBC  Date Value Ref Range Status  10/15/2023 3.58 (L) 3.77 - 5.28 x10E6/uL Final  08/02/2023 3.48 (L) 3.87 - 5.11 MIL/uL Final         Failed - Fe (serum) in normal range and within 360 days    Iron   Date Value Ref Range Status  12/26/2020 85 28 - 170 ug/dL Final  89/71/7979 90 27 - 139 ug/dL Final   Iron  Saturation  Date Value Ref Range Status  02/24/2019 31 15 - 55 % Final   Saturation Ratios  Date Value Ref Range Status  12/26/2020 42 (H) 10.4 - 31.8 % Final         Failed - Ferritin in normal range and within 360 days    Ferritin  Date Value Ref Range Status  12/26/2020 25 11 - 307 ng/mL Final    Comment:    Performed at [facility], [address] [place], [place] [address]  02/24/2019 33 15 - 150 ng/mL Final         Passed - HGB in normal range and within 360 days    Hemoglobin  Date Value Ref Range Status  10/15/2023 11.6 11.1 - 15.9 g/dL Final         Passed - HCT in normal range and within 360 days    Hematocrit  Date Value Ref Range Status  10/15/2023 35.9 34.0 - 46.6 % Final         Passed - Valid encounter within last 12 months    Recent Outpatient Visits           2 months ago Annual physical exam   [place] [facility] Melvin Pao, NP   4 months ago Chronic diastolic CHF (congestive heart failure), NYHA class 2  [facility])    [place] [facility] Melvin Pao, NP               calcitRIOL  (ROCALTROL ) 0.25 MCG capsule [Pharmacy Med Name: CALCITRIOL  0.25 MCG CAPSULE] 90 capsule 0    Sig: Take 1 capsule (0.25 mcg total) by mouth daily.     Endocrinology:  Vitamins - Vitamin D Supplementation - calcitriol  Failed - 01/09/2024  5:22 PM      Failed - Phosphate in normal range and within 360 days    No results found for: PHOS       Failed - PTH in normal range and within 360 days    No results found for: IOPTH, PTHINTACTFNA, PTH       Passed - Ca in normal range and within 360 days    Calcium   Date Value Ref Range Status  10/15/2023 9.2 8.7 - 10.3 mg/dL Final   Calcium , Total  Date Value Ref Range Status  11/01/2013 8.4 (L) 8.5 -  10.1 mg/dL Final         Passed - Valid encounter within last 12 months    Recent Outpatient Visits           2 months ago Annual physical exam   [place] [facility] Melvin Pao, NP   4 months ago Chronic diastolic CHF (congestive heart failure), NYHA class 2 [facility])   [place] [facility] Melvin Pao, NP

## 2024-01-15 ENCOUNTER — Ambulatory Visit: Admitting: Nurse Practitioner

## 2024-01-15 NOTE — Progress Notes (Deleted)
   There were no vitals taken for this visit.   Subjective:    Patient ID: Rebecca Parker, female    DOB: 1950-06-19, 73 y.o.   MRN: [id number]  HPI: Rebecca Parker is a 73 y.o. female  No chief complaint on file.  HYPERTENSION / HYPERLIPIDEMIA Satisfied with current treatment? yes Duration of hypertension: years BP monitoring frequency: not checking BP range:  BP medication side effects: no Past BP meds: losartan  (cozaar ) Duration of hyperlipidemia: years Cholesterol medication side effects: no Cholesterol supplements: none Past cholesterol medications:Simvastatin   Medication compliance: excellent compliance Aspirin: yes Recent stressors: no Recurrent headaches: no Visual changes: no Palpitations: no Dyspnea: yes Chest pain: no Lower extremity edema: no Dizzy/lightheaded: no  DIABETES Last A1c was 5.9%.  Doing well with Metformin  500mg  BID Hypoglycemic episodes:no Polydipsia/polyuria: no Visual disturbance: no Chest pain: no Paresthesias: no Glucose Monitoring: no  Accucheck frequency: Not Checking  Fasting glucose:  Post prandial:  Evening:  Before meals: Taking Insulin ?: no  Long acting insulin :  Short acting insulin : Blood Pressure Monitoring: not checking Retinal Examination: Not up to Date Foot Exam: Up to date Diabetic Education: Not Completed Pneumovax: Up to Date Influenza: Up to Date Aspirin: yes  HYPOTHYROIDISM Thyroid  control status:controlled Satisfied with current treatment? yes Medication side effects: no Medication compliance: excellent compliance Etiology of hypothyroidism:  Recent dose adjustment:no Fatigue: yes Cold intolerance: no Heat intolerance: no Weight gain: no Weight loss: no Constipation: no Diarrhea/loose stools: no Palpitations: no Lower extremity edema: no Anxiety/depressed mood: yes Relevant past medical, surgical, family and social history reviewed and updated as indicated. Interim medical history since our last  visit reviewed. Allergies and medications reviewed and updated.  Review of Systems  Per HPI unless specifically indicated above     Objective:    There were no vitals taken for this visit.  Wt Readings from Last 3 Encounters:  10/21/23 146 lb 6.4 oz (66.4 kg)  10/15/23 145 lb (65.8 kg)  09/24/23 143 lb 4.8 oz (65 kg)    Physical Exam  Results for orders placed or performed during the hospital encounter of 10/28/23  Glucose, capillary   Collection Time: 10/28/23  1:40 PM  Result Value Ref Range   Glucose-Capillary 94 70 - 99 mg/dL  Glucose, capillary   Collection Time: 10/28/23  3:18 PM  Result Value Ref Range   Glucose-Capillary 90 70 - 99 mg/dL      Assessment & Plan:   Problem List Items Addressed This Visit   None    Follow up plan: No follow-ups on file.

## 2024-01-19 ENCOUNTER — Telehealth: Payer: Self-pay | Admitting: Nurse Practitioner

## 2024-01-19 NOTE — Telephone Encounter (Signed)
 Copied from CRM [id number]. Topic: Medicare AWV >> Jan 19, 2024  9:59 AM Nathanel DEL wrote: Reason for CRM: Called 01/19/2024 to sched AWV - NO VOICEMAIL  Nathanel Paschal; Care Guide Ambulatory Clinical Support [facility] Direct Dial: [phone]

## 2024-01-20 ENCOUNTER — Other Ambulatory Visit: Payer: Self-pay | Admitting: Nurse Practitioner

## 2024-01-21 NOTE — Telephone Encounter (Signed)
 Requested Prescriptions  Pending Prescriptions Disp Refills   levothyroxine  (SYNTHROID ) 112 MCG tablet [Pharmacy Med Name: LEVOTHYROXINE  112 MCG TABLET] 90 tablet 0    Sig: Take 1 tablet (112 mcg total) by mouth daily.     Endocrinology:  Hypothyroid Agents Failed - 01/21/2024 12:31 PM      Failed - TSH in normal range and within 360 days    TSH  Date Value Ref Range Status  10/15/2023 28.000 (H) 0.450 - 4.500 uIU/mL Final         Passed - Valid encounter within last 12 months    Recent Outpatient Visits           3 months ago Annual physical exam   [place] [facility] Melvin Pao, NP   4 months ago Chronic diastolic CHF (congestive heart failure), NYHA class 2 (HCC)   [place] [facility] Melvin Pao, NP               hydrOXYzine  (ATARAX ) 25 MG tablet [Pharmacy Med Name: HYDROXYZINE  HCL 25 MG TABLET] 90 tablet 0    Sig: Take 1 tablet (25 mg total) by mouth daily. Take 25mg . By mouth every morning     Ear, Nose, and Throat:  Antihistamines 2 Failed - 01/21/2024 12:31 PM      Failed - Cr in normal range and within 360 days    Creatinine  Date Value Ref Range Status  11/01/2013 1.13 0.60 - 1.30 mg/dL Final   Creatinine, Ser  Date Value Ref Range Status  10/15/2023 1.91 (H) 0.57 - 1.00 mg/dL Final         Passed - Valid encounter within last 12 months    Recent Outpatient Visits           3 months ago Annual physical exam   [place] [facility] Melvin Pao, NP   4 months ago Chronic diastolic CHF (congestive heart failure), NYHA class 2 [facility])   [facility] Melvin Pao, NP

## 2024-03-09 ENCOUNTER — Other Ambulatory Visit: Payer: Self-pay | Admitting: Oncology

## 2024-03-09 ENCOUNTER — Ambulatory Visit: Payer: Self-pay | Admitting: Oncology

## 2024-03-09 DIAGNOSIS — E890 Postprocedural hypothyroidism: Secondary | ICD-10-CM

## 2024-03-09 DIAGNOSIS — C539 Malignant neoplasm of cervix uteri, unspecified: Secondary | ICD-10-CM

## 2024-03-09 DIAGNOSIS — C73 Malignant neoplasm of thyroid gland: Secondary | ICD-10-CM

## 2024-03-19 ENCOUNTER — Ambulatory Visit: Payer: Self-pay

## 2024-03-19 NOTE — Telephone Encounter (Signed)
 Patient very upset about not being able to see GI doctor until Feb 2026 Symptoms today include weakness, fatigue, congestion x 2 weeks, dry cough  Patient wants to be seen sooner than Feb 2026  FYI Only or Action Required?: FYI only for provider: Urgent Care or ER advised---patient states she will go to the ER.  Patient was last seen in primary care on 10/15/2023 by Melvin Pao, NP.  Called Nurse Triage reporting Fatigue.  Symptoms began unknown.  Interventions attempted: Rest, hydration, or home remedies.  Symptoms are: gradually worsening.  Triage Disposition: See HCP Within 4 Hours (Or PCP Triage)  Patient/caregiver understands and will follow disposition?: Yes--patient states she is going to the ER                  Copied from CRM #[id number]. Topic: Clinical - Red Word Triage >> Mar 19, 2024 10:40 AM Larissa RAMAN wrote: [facility] that prompted transfer to Nurse Triage: wheezing, fatigue Reason for Disposition  [1] MODERATE weakness (e.g., interferes with work, school, normal activities) AND [2] cause unknown  (Exceptions: Weakness from acute minor illness or poor fluid intake; weakness is chronic and not worse.)  Answer Assessment - Initial Assessment Questions Patient states she feels tired & weak Patient found out she had hepatitis in July 2025 Congested x 2 weeks Cough--dry Denies difficulty breathing Denies any known fever  Patient very upset about not getting treatment for hepatitis until Feb 2026.  Patient states that the gastroenterology cannot see her until Feb 2026 Patient is very upset about this and wants someone to see her about this before then She states that she just feels very unwell and states she was told to call about   Patient states that she is very fatigued and weak This RN advised her that if she is feeling severely fatigued and unwell at this time along with congestion x 2 weeks---it is recommended that she goes to Urgent Care or the  Emergency Room at this time for further evaluation  Patient's son is with her and he agreed with patient going to the ER at this time.  Patient decided to go be evaluated at the ER for her current symptoms  She is advised to call us  back with any further questions/concerns and this message would be sent to her PCP Pao Melvin NP  Protocols used: Weakness (Generalized) and [facility]

## 2024-03-19 NOTE — Telephone Encounter (Signed)
 Patient can be seen in the office for symptoms.  Okay to see another provider next week if there are any appts available.

## 2024-03-22 ENCOUNTER — Encounter: Payer: Self-pay | Admitting: Nurse Practitioner

## 2024-03-22 ENCOUNTER — Ambulatory Visit (INDEPENDENT_AMBULATORY_CARE_PROVIDER_SITE_OTHER): Admitting: Nurse Practitioner

## 2024-03-22 VITALS — BP 134/87 | HR 94 | Temp 94.4°F | Ht 60.0 in | Wt 130.4 lb

## 2024-03-22 DIAGNOSIS — B1920 Unspecified viral hepatitis C without hepatic coma: Secondary | ICD-10-CM

## 2024-03-22 DIAGNOSIS — E538 Deficiency of other specified B group vitamins: Secondary | ICD-10-CM

## 2024-03-22 NOTE — Progress Notes (Signed)
 BP 134/87 (BP Location: Left Arm, Cuff Size: Normal)   Pulse 94   Temp (!) 94.4 F (34.7 C) (Oral)   Ht 5' (1.524 m)   Wt 130 lb 6.4 oz (59.1 kg)   SpO2 98%   BMI 25.47 kg/m    Subjective:    Patient ID: Rebecca Parker, female    DOB: 06-21-1950, 73 y.o.   MRN: [id number]  HPI: Rebecca Parker is a 73 y.o. female  Chief Complaint  Patient presents with   Hepatitis    Patient stated she is not feeling like herself today. She feels tired and weak.   Patient presents to clinic with complaints of hepatitis.  She states she isn't feeling great today. She feels like it is too long to have the virus in here.  States she feels tired and run down too much.  She isn't able to do any yard. She is not taking a B12 supplement.      Relevant past medical, surgical, family and social history reviewed and updated as indicated. Interim medical history since our last visit reviewed. Allergies and medications reviewed and updated.  Review of Systems  Constitutional:  Positive for fatigue.    Per HPI unless specifically indicated above     Objective:    BP 134/87 (BP Location: Left Arm, Cuff Size: Normal)   Pulse 94   Temp (!) 94.4 F (34.7 C) (Oral)   Ht 5' (1.524 m)   Wt 130 lb 6.4 oz (59.1 kg)   SpO2 98%   BMI 25.47 kg/m   Wt Readings from Last 3 Encounters:  03/22/24 130 lb 6.4 oz (59.1 kg)  10/21/23 146 lb 6.4 oz (66.4 kg)  10/15/23 145 lb (65.8 kg)    Physical Exam Vitals and nursing note reviewed.  Constitutional:      General: She is not in acute distress.    Appearance: Normal appearance. She is normal weight. She is not ill-appearing, toxic-appearing or diaphoretic.  HENT:     Head: Normocephalic.     Right Ear: External ear normal.     Left Ear: External ear normal.     Nose: Nose normal.     Mouth/Throat:     Mouth: Mucous membranes are moist.     Pharynx: Oropharynx is clear.  Eyes:     General:        Right eye: No discharge.        Left eye: No  discharge.     Extraocular Movements: Extraocular movements intact.     Conjunctiva/sclera: Conjunctivae normal.     Pupils: Pupils are equal, round, and reactive to light.  Cardiovascular:     Rate and Rhythm: Normal rate and regular rhythm.     Heart sounds: No murmur heard. Pulmonary:     Effort: Pulmonary effort is normal. No respiratory distress.     Breath sounds: Normal breath sounds. No wheezing or rales.  Musculoskeletal:     Cervical back: Normal range of motion and neck supple.  Skin:    General: Skin is warm and dry.     Capillary Refill: Capillary refill takes less than 2 seconds.  Neurological:     General: No focal deficit present.     Mental Status: She is alert and oriented to person, place, and time. Mental status is at baseline.  Psychiatric:        Mood and Affect: Mood normal.        Behavior: Behavior normal.  Thought Content: Thought content normal.        Judgment: Judgment normal.     Results for orders placed or performed during the hospital encounter of 10/28/23  Glucose, capillary   Collection Time: 10/28/23  1:40 PM  Result Value Ref Range   Glucose-Capillary 94 70 - 99 mg/dL  Glucose, capillary   Collection Time: 10/28/23  3:18 PM  Result Value Ref Range   Glucose-Capillary 90 70 - 99 mg/dL      Assessment & Plan:   Problem List Items Addressed This Visit       Digestive   Hepatitis C - Primary   Patient is anxious about waiting until February for her Hepatitis C consultation.  Reassured patient and educated patient on disease process.       Relevant Orders   Comp Met (CMET)   CBC w/Diff     Other   Vitamin B12 deficiency   Chronic. Will check labs at visit today.  Will make recommendations based on results.       Relevant Orders   B12     Follow up plan: No follow-ups on file.

## 2024-03-22 NOTE — Assessment & Plan Note (Signed)
 Chronic.  Will check labs at visit today.  Will make recommendations based on results.

## 2024-03-22 NOTE — Assessment & Plan Note (Signed)
 Patient is anxious about waiting until February for her Hepatitis C consultation.  Reassured patient and educated patient on disease process.

## 2024-03-22 NOTE — Telephone Encounter (Signed)
 Scheduled for 11/24 with PCP

## 2024-03-24 ENCOUNTER — Ambulatory Visit

## 2024-03-25 LAB — CBC WITH DIFFERENTIAL/PLATELET
Basophils Absolute: 0 x10E3/uL (ref 0.0–0.2)
Basos: 0 %
EOS (ABSOLUTE): 0.4 x10E3/uL (ref 0.0–0.4)
Eos: 6 %
Hematocrit: 36.9 % (ref 34.0–46.6)
Hemoglobin: 12.3 g/dL (ref 11.1–15.9)
Immature Grans (Abs): 0 x10E3/uL (ref 0.0–0.1)
Immature Granulocytes: 0 %
Lymphocytes Absolute: 1.2 x10E3/uL (ref 0.7–3.1)
Lymphs: 16 %
MCH: 32.9 pg (ref 26.6–33.0)
MCHC: 33.3 g/dL (ref 31.5–35.7)
MCV: 99 fL — ABNORMAL HIGH (ref 79–97)
Monocytes Absolute: 0.7 x10E3/uL (ref 0.1–0.9)
Monocytes: 9 %
Neutrophils Absolute: 5.1 x10E3/uL (ref 1.4–7.0)
Neutrophils: 69 %
Platelets: 399 x10E3/uL (ref 150–450)
RBC: 3.74 x10E6/uL — ABNORMAL LOW (ref 3.77–5.28)
RDW: 12.9 % (ref 11.7–15.4)
WBC: 7.5 x10E3/uL (ref 3.4–10.8)

## 2024-03-25 LAB — COMPREHENSIVE METABOLIC PANEL WITH GFR
ALT: 7 IU/L (ref 0–32)
AST: 16 IU/L (ref 0–40)
Albumin: 3.7 g/dL — ABNORMAL LOW (ref 3.8–4.8)
Alkaline Phosphatase: 62 IU/L (ref 49–135)
BUN/Creatinine Ratio: 8 — ABNORMAL LOW (ref 12–28)
BUN: 14 mg/dL (ref 8–27)
Bilirubin Total: <0.2 mg/dL (ref 0.0–1.2)
CO2: 18 mmol/L — ABNORMAL LOW (ref 20–29)
Calcium: 8.3 mg/dL — ABNORMAL LOW (ref 8.7–10.3)
Chloride: 103 mmol/L (ref 96–106)
Creatinine, Ser: 1.73 mg/dL — ABNORMAL HIGH (ref 0.57–1.00)
Globulin, Total: 2.6 g/dL (ref 1.5–4.5)
Glucose: 80 mg/dL (ref 70–99)
Potassium: 5 mmol/L (ref 3.5–5.2)
Sodium: 140 mmol/L (ref 134–144)
Total Protein: 6.3 g/dL (ref 6.0–8.5)
eGFR: 30 mL/min/1.73 — ABNORMAL LOW (ref >59)

## 2024-03-25 LAB — VITAMIN B12: Vitamin B-12: 172 pg/mL — AB (ref 232–1245)

## 2024-03-29 ENCOUNTER — Ambulatory Visit

## 2024-03-29 ENCOUNTER — Ambulatory Visit: Payer: Self-pay | Admitting: Nurse Practitioner

## 2024-03-29 DIAGNOSIS — E538 Deficiency of other specified B group vitamins: Secondary | ICD-10-CM

## 2024-03-29 MED ORDER — CYANOCOBALAMIN 1000 MCG/ML IJ SOLN
1000.0000 ug | INTRAMUSCULAR | Status: AC
  Administered 2024-03-29 – 2024-04-19 (×4): 1000 ug via INTRAMUSCULAR

## 2024-03-29 NOTE — Progress Notes (Signed)
 Patient is in office today for a nurse visit for B12 Injection. Patient Injection was given in the  Right deltoid. Patient tolerated injection well.

## 2024-03-31 ENCOUNTER — Inpatient Hospital Stay: Attending: Obstetrics and Gynecology | Admitting: Obstetrics and Gynecology

## 2024-03-31 VITALS — BP 122/64 | HR 90 | Temp 99.1°F | Ht 60.0 in | Wt 126.0 lb

## 2024-03-31 DIAGNOSIS — Z08 Encounter for follow-up examination after completed treatment for malignant neoplasm: Secondary | ICD-10-CM | POA: Diagnosis present

## 2024-03-31 DIAGNOSIS — C539 Malignant neoplasm of cervix uteri, unspecified: Secondary | ICD-10-CM

## 2024-03-31 DIAGNOSIS — Z8541 Personal history of malignant neoplasm of cervix uteri: Secondary | ICD-10-CM | POA: Insufficient documentation

## 2024-03-31 DIAGNOSIS — Z9221 Personal history of antineoplastic chemotherapy: Secondary | ICD-10-CM | POA: Insufficient documentation

## 2024-03-31 DIAGNOSIS — B1920 Unspecified viral hepatitis C without hepatic coma: Secondary | ICD-10-CM | POA: Diagnosis not present

## 2024-03-31 DIAGNOSIS — Z923 Personal history of irradiation: Secondary | ICD-10-CM | POA: Insufficient documentation

## 2024-03-31 NOTE — Progress Notes (Signed)
 Gynecologic Oncology Interval Visit   Referring Provider: Debby JINNY Dinsmore, MD   Chief Concern: cervical cancer and abnormal pap  Subjective:  Rebecca Parker is a 73 y.o. G3P3 female who is seen in consultation from Dr. Dinsmore for cervical cancer now s/p chemo and radiation completed 02/08/21.   She presents today for follow up. No new gyn complaints.   Pap was 09/10/2021 and was unsatisfactory for interpretation.  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 Repeat pap 02/2023 was NILM- HR HPV Positive.   In interim, she established care with new pcp and was found to have hepatitis c. Has not seen anyone for treatment.   Gynecologic Oncology History:  12/01/2020-8/9/022 She was admitted from the ED for 1 day h/o right lower abdominal / pelvic pain. CT scan revealed a right ovarian cyst 5 cm and air in endometrial cavity. Bedside exam revealed a firm cervical rim and purulent d/c. WBC 23K concern for pyometra.    12/01/2020 Fx D+C and cervical bx. Uterus sounded to 9 cm.  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   Initally she was thought of urosepsis however neg urine culture. In retrospect, urine was probably contaminated with purulent cervical discharge. She was treated with gent and Cleocin  switched to oral Flagyl  and is currently on Augmentin . Clinically patient's pain improved with IV/ PO abx . Hospitalist helped managed DM and CHF ( no significant tx) She was also diagnosed with anemia. Hypoalbuminemia, and thrombocytosis. Her tox screen was positive for cocaine she says she only uses occasionally.    On pelvic exam suspect lateral vaginal disease >50% down the vault and disease involving the left fornix. Cervix enlarged >4 cm with grossly obvious tumor and hard to palpation. Uterus - may be enlarged with firm mass on the right aspect versus palpation of  the right adnexal mass. Positive parametrial involvement on the right and on the left with disease to or almost to the sidewall on the left. Rectovaginal exam was confirmatory.     12/27/2020, PET scan showed signs of cervical cancer with diffuse involvement of the uterus. Cystic and solid right ovarian lesion more likely related to diffuse cervical cancer.  Concomitant synchronous cystic ovarian neoplasm could have appearance but feel less likely given the diffuse nature of disease throughout the uterus and cervix.  Small lymph nodes in the pelvis in the left pelvis suspicious for nodal involvement at the common iliac level.  No solid organ distant metastasis.  Treated with external radiation therapy with weekly cisplatin .   She had pet for restaging on 05/09/21 which showed excellent response to treatment. No residual hypermetabolic tumor was identified. Persistent partially cystic right adnexal mass. Slightly smaller and SUV has decreased. Persistent known thyroid  neoplasm (see below biopsy).   Then received brachytherapy with Dr Lurline at [facility] in 1/23.   4/23 noted to have copious foul smelling vaginal discharge and prescribed Metrogel .  Discharge and odor improved.   09/10/2021 Pap was unsatisfactory for interpretation.  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  01/10/2022 CT C/A/P IMPRESSION: 1. Pulmonary nodules measuring up to 4 mm are unchanged from the prior examination. No new pulmonary nodules. No follow-up needed if patient is low-risk (and has no known or suspected primary neoplasm). Non-contrast chest CT can be considered in 12 months if patient is high-risk. This recommendation follows the  consensus statement: Guidelines for Management of Incidental Pulmonary Nodules Detected on CT Images: From the Fleischner Society 2017; Radiology 2017; 284:228-243. 2. Mild bladder wall thickening with surrounding inflammation concerning for cystitis. 3. Mild cardiomegaly. 4. Stable prominence and  low-attenuation of the right ovary. Please see pelvic ultrasound from 12/02/2020.  02/2023 Pap was NILM- HR HPV Positive.   Thyroid  Cancer History   She had thyroid  biopsy on 03/05/21.  A. THYROID  GLAND, RIGHT MIDDLE LOBE; ULTRASOUND-GUIDED FINE-NEEDLE ASPIRATION:  - SUSPICIOUS FOR FOLLICULAR NEOPLASM ([place] CATEGORY IV).   She was seen by ENT on 02/13/2021 and there was no evidence of tonsillary or ENT cancer on exam. HIV test negative 11/2020. Thyroidectomy 12/18/21  03/05/22- US  Thyroid  No evidence of recurrent or residual thyroid  malignancy  Problem List: Patient Active Problem List   Diagnosis Date Noted   Hepatitis C 09/24/2023   Stage 3b chronic kidney disease (HCC) 09/10/2023   Diabetes mellitus treated with oral medication (HCC) 09/10/2023   Hypocalcemia 02/25/2022   Post-surgical hypothyroidism 02/25/2022   Thyroid  cancer (HCC) 12/18/2021   Port-A-Cath in place 01/17/2021   Abnormal positron emission tomography (PET) scan 01/17/2021   Tobacco use 01/03/2021   Encounter for antineoplastic chemotherapy 01/03/2021   Vitamin B12 deficiency 01/03/2021   B12 deficiency anemia 01/03/2021   Normocytic anemia 12/23/2020   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HCC) 12/23/2020   Goals of care, counseling/discussion 12/23/2020   Cocaine abuse (HCC) 12/23/2020   Sepsis (HCC) 12/01/2020   Ovarian mass, right 12/01/2020   Acute lower UTI 12/01/2020   Pelvic pain 12/01/2020   Mixed incontinence 10/24/2015   Venous insufficiency of both lower extremities 11/24/2014   Benign essential hypertension 11/18/2014   Chronic diastolic CHF (congestive heart failure), NYHA class 2 (HCC) 05/17/2014   Mixed hyperlipidemia 05/17/2014   Tobacco dependence 02/10/2014   Thyroid  nodule 02/10/2014   Spinal stenosis, lumbar region without neurogenic claudication 03/05/2013   Constipation 01/29/2013   Eosinophil count raised 10/20/2012   Urinary incontinence 10/19/2012   Controlled diabetes mellitus  type 2 with complications (HCC) 07/24/2012   Neck pain 06/15/2012   Polyneuropathy 11/15/2011   Palmar fascial fibromatosis 09/13/2011   Edema 05/29/2011   Vitamin D deficiency 07/31/2009   Insomnia 07/27/2009   Past Medical History: 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, cN1, cM0); Tx'd with Cisplatin  + EBRT   Chest pain, unspecified    CHF (congestive heart failure) (HCC)    Chronic diastolic heart failure, NYHA class 2 (HCC)    Cocaine use    none since Cancer dx (11/2021)   Diabetes mellitus without complication (HCC)    Edentulous    lost dentures   Grade I diastolic dysfunction    Hepatitis    Hip pain    HLD (hyperlipidemia)    Hyperplastic colon polyp    Hypertension    Hypothyroidism    Multinodular goiter    Port-A-Cath in place    Thyroid  cancer (HCC)    Thyroid  mass    a.) 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)   Tubular adenoma of colon     Past Surgical History: Past Surgical History:  Procedure Laterality Date   ABDOMINAL HYSTERECTOMY     CATARACT EXTRACTION W/PHACO Left 08/14/2023   Procedure: PHACOEMULSIFICATION, CATARACT, WITH IOL INSERTION  3.93  00:31.6;  Surgeon: Enola,  Feliciano Hugger, MD;  Location: [facility];  Service: Ophthalmology;  Laterality: Left;   CATARACT EXTRACTION W/PHACO Right 08/28/2023   Procedure: PHACOEMULSIFICATION, CATARACT, WITH IOL INSERTION 2.67 00:21.9;  Surgeon: Enola Feliciano Hugger, MD;  Location: [facility];  Service: Ophthalmology;  Laterality: Right;   COLONOSCOPY WITH PROPOFOL  N/A 11/24/2015   Procedure: COLONOSCOPY WITH PROPOFOL ;  Surgeon: Gladis RAYMOND Mariner, MD;  Location: [facility] ENDOSCOPY;  Service: Endoscopy;  Laterality: N/A;   COLONOSCOPY WITH PROPOFOL  N/A 03/18/2019   Procedure: COLONOSCOPY WITH PROPOFOL ;  Surgeon: Janalyn Keene NOVAK, MD;  Location: [facility] ENDOSCOPY;  Service: Endoscopy;  Laterality: N/A;   DILATION AND CURETTAGE OF UTERUS N/A 12/01/2020   Procedure: DILATATION AND CURETTAGE with cervical biopsies;  Surgeon: Schermerhorn, Debby PARAS, MD;  Location: [facility] ORS;  Service: Gynecology;  Laterality: N/A;   ESOPHAGOGASTRODUODENOSCOPY (EGD) WITH PROPOFOL  N/A 03/18/2019   Procedure: ESOPHAGOGASTRODUODENOSCOPY (EGD) WITH PROPOFOL ;  Surgeon: Janalyn Keene NOVAK, MD;  Location: [facility] ENDOSCOPY;  Service: Endoscopy;  Laterality: N/A;   PORTA CATH INSERTION N/A 01/16/2021   Procedure: PORTA CATH INSERTION;  Surgeon: Jama Cordella MATSU, MD;  Location: [facility] INVASIVE CV LAB;  Service: Cardiovascular;  Laterality: N/A;   PORTA CATH REMOVAL N/A 10/28/2023   Procedure: PORTA CATH REMOVAL;  Surgeon: Jama Cordella MATSU, MD;  Location: [facility] INVASIVE CV LAB;  Service: Cardiovascular;  Laterality: N/A;   right ankle orif     THYROIDECTOMY, COMPLETION Bilateral 12/18/2021    Past Gynecologic History:  As per HPI. She does not recall dates of menarche, LMP. She does have a h/o abnormal Pap, but last Pap not listed.   OB History:  OB History  Gravida Para Term Preterm AB Living  3 3      SAB IAB Ectopic Multiple Live Births          # Outcome Date GA Lbr Len/2nd Weight Sex Type Anes PTL Lv  3 Para           2 Para           1 Para             Family History: Family History  Problem Relation Age of Onset   Hypertension Mother    Breast cancer Maternal Aunt    Breast cancer Paternal Aunt    Hypertension Maternal Grandfather     Social History: Social History   Socioeconomic History   Marital status: Legally Separated    Spouse name: Not on file   Number of children: Not on file   Years of education: Not on file   Highest education level: Not on file  Occupational History   Not on file  Tobacco Use   Smoking status: Every Day    Current packs/day: 0.10    Average packs/day: 0.1 packs/day for 59.9 years (6.0 ttl  pk-yrs)    Types: Cigarettes    Start date: 04/29/1964    Passive exposure: Never   Smokeless tobacco: Never  Vaping Use   Vaping status: Never Used  Substance and Sexual Activity   Alcohol use: Not Currently   Drug use: Yes    Types: Crack cocaine    Comment: Patient is trying to quit   Sexual activity: Not Currently  Other Topics Concern   Not on file  Social History Narrative   Not on file   Social Drivers of Health   Financial Resource Strain: Not on file  Food Insecurity: No Food Insecurity (10/15/2023)   Hunger Vital Sign  Worried About Programme Researcher, Broadcasting/film/video in the Last Year: Never true    Ran Out of Food in the Last Year: Never true  Transportation Needs: No Transportation Needs (10/15/2023)   PRAPARE - Administrator, Civil Service (Medical): No    Lack of Transportation (Non-Medical): No  Physical Activity: Not on file  Stress: Not on file  Social Connections: Not on file  Intimate Partner Violence: Not on file    Allergies: Allergies  Allergen Reactions   Oxybutynin  Itching and Other (See Comments)   Pregabalin  Other (See Comments)    Hallucentations   Ace Inhibitors Itching    Rash   Cyclobenzaprine Itching    Rash and 'made me nervous'    Current Medications: Current Outpatient Medications  Medication Sig Dispense Refill   aspirin EC 81 MG tablet Take 81 mg by mouth daily.     calcitRIOL  (ROCALTROL ) 0.25 MCG capsule Take 1 capsule (0.25 mcg total) by mouth daily. 90 capsule 0   Calcium  Carbonate-Vitamin D 500-125 MG-UNIT TABS Take 1 tablet by mouth daily.     FEROSUL 325 (65 Fe) MG tablet Take 1 tablet (325 mg total) by mouth 2 (two) times daily. 180 tablet 0   hydrOXYzine  (ATARAX ) 25 MG tablet Take 1 tablet (25 mg total) by mouth daily. Take 25mg . By mouth every morning 90 tablet 0   levothyroxine  (SYNTHROID ) 112 MCG tablet Take 1 tablet (112 mcg total) by mouth daily. 90 tablet 0   losartan  (COZAAR ) 50 MG tablet Take 1 tablet (50 mg  total) by mouth 2 (two) times daily. 180 tablet 0   metFORMIN  (GLUCOPHAGE ) 500 MG tablet Take 1 tablet (500 mg total) by mouth 2 (two) times daily with a meal. 180 tablet 0   mirtazapine  (REMERON ) 15 MG tablet Take 1 tablet (15 mg total) by mouth at bedtime. 90 tablet 0   ondansetron  (ZOFRAN ) 4 MG tablet Take 1 tablet (4 mg total) by mouth every 6 (six) hours as needed for nausea. 20 tablet 0   simvastatin  (ZOCOR ) 20 MG tablet Take 1 tablet (20 mg total) by mouth daily. 90 tablet 0   Current Facility-Administered Medications  Medication Dose Route Frequency Provider Last Rate Last Admin   cyanocobalamin  (VITAMIN B12) injection 1,000 mcg  1,000 mcg Intramuscular Weekly    1,000 mcg at 03/29/24 1419    Review of Systems General:  fatigue and weakness Skin: no complaints Eyes: no complaints HEENT: no complaints Breasts: no complaints Pulmonary: shortness of breath related to her heart failure Cardiac: no complaints Gastrointestinal: no complaints Genitourinary/Sexual: no complaints Ob/Gyn: no complaints Musculoskeletal: no complaints Hematology: no complaints Neurologic/Psych: no complaints   Objective:  Physical Examination:  BP 122/64 (BP Location: Right Arm, Patient Position: Sitting)   Pulse 90   Temp 99.1 F (37.3 C) (Tympanic)   Ht 5' (1.524 m)   Wt 126 lb (57.2 kg)   SpO2 97%   BMI 24.61 kg/m    GENERAL: Patient is a well appearing female in no acute distress HEENT:  Sclera clear. Anicteric NODES:  Negative axillary, supraclavicular, inguinal lymph node survery LUNGS:  Clear to auscultation bilaterally.   HEART:  Regular rate and rhythm.  ABDOMEN:  Soft, nontender.  No hernias, incisions well healed. No masses or ascites EXTREMITIES:  No peripheral edema. Atraumatic. No cyanosis SKIN:  Clear with no obvious rashes or skin changes.  NEURO:  Nonfocal. Well oriented.  Appropriate affect.  Pelvic: Exam chaperoned by Nurse navigator Vulva: normal appearing  vulva with  no masses, tenderness or lesions Vagina: normal vagina other than foreshortened Cervix: unable to identify due to aggluination Uterus: not enlarged.  BME: Normal vagina.  No masses or nodularity were noted,  Normal post radiation changes.  Parametria was smooth bilaterally.  Lab Review N/A  Radiologic Imaging: Per hpi    Assessment:  Rebecca Parker is a 73 y.o. female diagnosed with locally advanced stage IIIC-r cervical cancer with right ovarian mass which may represent metastatic disease versus more likely benign ovarian cyst/TOA/separate primary (the latter is unlikely) s/p external beam radiotherapy with weekly cisplatin  completed in 10/22. Then had brachytherapy x 5 from 12/22-1/23. She had PET for restaging on 05/09/21 which showed excellent response to treatment. No residual hypermetabolic tumor was identified.  Excellent response based on exam. NED on exam and CT scan shows no evidence of disease 9/23.  No evidence of disease on exam today.   Mucosal radiation reaction of upper vagina.   NILM pap, HR HPV positive 2024.   HTN, asymptomatic  Pulmonary lesions- indeterminate- stable on 01/09/2022 imaging. Smoker.   Hepatitis C- untreated  Medical co-morbidities complicating care: Body mass index is 24.61 kg/m. Positive cocaine use. Type 2 diabetes mellitus without complication; h/o sepsis; HTN; Chronic diastolic CHF (congestive heart failure), NYHA class 2 (HCC) Plan:   Problem List Items Addressed This Visit       Genitourinary   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HCC) - Primary (Chronic)    She will follow up in 6 months to check on her vaginal exam.     Pulmonary lesions- stable on 01/09/22 CT- recommended non-contrast Chest ct in 2024. Recommend she see Dr Babara for follow up.   Recommended that she follow-up with her primary care provider.  Hepatitis c- recommend she f/u with pcp for ref to GI/hepatitis clinic.   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 Reviewed symptoms that would warrant sooner return: anterior neck symptoms (pain, tenderness, and/or swelling), dysphagia, dysphonia, hoarseness, positional dyspnea, or persistent anterior cervical lymphadenopathy. Recommend surveillance blood work including It Consultant (Thyroglobulin + Thyroglobulin antibodies [TgAb] and ultrasound thyroid /neck. She has not followed up at [facility] which I encouraged her to do so or she could be followed by ENT, medical oncology/Dr Babara. I'll send schedule message to Dr Layvonne team to see her for follow up and blood work.   IV port removed in July with Dr Jama.   The patient's diagnosis, an outline of the further diagnostic and laboratory studies which will be required, the recommendation, and alternatives were discussed.  All questions were answered to the patient's satisfaction.  Tinnie Dawn, DNP, AGNP-C, AOCNP [facility] [phone] (clinic)  I personally had a face to face interaction and evaluated the patient jointly with the NP, Ms. Tinnie Dawn.  I have reviewed her history and available records and have performed the key portions of the physical exam including lymph node survey, abdominal exam, pelvic exam with my findings confirming those documented above by the APP.  I have discussed the case with the APP and the patient.  I agree with the above documentation, assessment and plan which was fully formulated by me.  Counseling was completed by me.   I personally saw the patient and performed a substantive portion of this encounter in conjunction with the listed APP as documented above.  Prentice Agent, MD

## 2024-04-05 ENCOUNTER — Ambulatory Visit

## 2024-04-05 ENCOUNTER — Other Ambulatory Visit: Payer: Self-pay | Admitting: Nurse Practitioner

## 2024-04-05 DIAGNOSIS — D518 Other vitamin B12 deficiency anemias: Secondary | ICD-10-CM | POA: Diagnosis not present

## 2024-04-05 NOTE — Progress Notes (Signed)
 Patient is in office today for a nurse visit for B12 Injection. Patient Injection was given in the  Left deltoid. Patient tolerated injection well.

## 2024-04-06 ENCOUNTER — Other Ambulatory Visit: Payer: Self-pay | Admitting: Nurse Practitioner

## 2024-04-06 NOTE — Telephone Encounter (Signed)
 Copied from CRM #[id number]. Topic: Clinical - Medication Refill >> Apr 06, 2024 11:58 AM Tobias L wrote: Medication: levothyroxine  (SYNTHROID ) 112 MCG tablet hydrOXYzine  (ATARAX ) 25 MG tablet FEROSUL 325 (65 Fe) MG tablet mirtazapine  (REMERON ) 15 MG tablet simvastatin  (ZOCOR ) 20 MG tablet  Requesting 90 day supply with additional refills. Patient requesting refills as soon as possible as she is completely out of a few of these prescriptions.   Has the patient contacted their pharmacy? Yes Patient reached out to pharmacy, advised to reach out to office.   This is the patient's preferred pharmacy:  [facility] - [place], [place] - [address] [place] [address] Phone: [phone] Fax: [phone]  Is this the correct pharmacy for this prescription? Yes  Has the prescription been filled recently? No  Is the patient out of the medication? Yes  Has the patient been seen for an appointment in the last year OR does the patient have an upcoming appointment? Yes  Can we respond through MyChart? No  Agent: Please be advised that Rx refills may take up to 3 business days. We ask that you follow-up with your pharmacy.

## 2024-04-06 NOTE — Telephone Encounter (Signed)
 Encounter being closed, and refill encounter being sent to the provider.

## 2024-04-12 ENCOUNTER — Other Ambulatory Visit: Payer: Self-pay | Admitting: Nurse Practitioner

## 2024-04-12 ENCOUNTER — Ambulatory Visit

## 2024-04-12 DIAGNOSIS — E538 Deficiency of other specified B group vitamins: Secondary | ICD-10-CM

## 2024-04-12 MED ORDER — FERROUS SULFATE 325 (65 FE) MG PO TABS: 325.0000 mg | ORAL_TABLET | Freq: Two times a day (BID) | ORAL | 0 refills | Status: AC

## 2024-04-12 MED ORDER — HYDROXYZINE HCL 25 MG PO TABS: 25.0000 mg | ORAL_TABLET | Freq: Every day | ORAL | 0 refills | Status: DC

## 2024-04-12 MED ORDER — LEVOTHYROXINE SODIUM 112 MCG PO TABS: 112.0000 ug | ORAL_TABLET | Freq: Every day | ORAL | 0 refills | Status: AC

## 2024-04-12 MED ORDER — METFORMIN HCL 500 MG PO TABS: 500.0000 mg | ORAL_TABLET | Freq: Two times a day (BID) | ORAL | 0 refills | Status: AC

## 2024-04-12 NOTE — Telephone Encounter (Signed)
 All medications are due for refill. Prescriptions t'd up for provider to send.

## 2024-04-12 NOTE — Telephone Encounter (Signed)
 Prescription Request  04/12/2024  LOV: 04/06/2024  What is the name of the medication?  metFORMIN  (GLUCOPHAGE ) 500 MG tablet  FEROSUL 325 (65 Fe) MG tablet  levothyroxine  (SYNTHROID ) 112 MCG tablet  hydrOXYzine  (ATARAX ) 25 MG tablet   Have you contacted your pharmacy to request a refill? No   Which pharmacy would you like this sent to?  [facility] - [place], [place] - [address] [place] [address] Phone: [phone] Fax: [phone]    Patient notified that their request is being sent to the clinical staff for review and that they should receive a response within 2 business days.   Please advise at Mobile [phone] (mobile)

## 2024-04-19 ENCOUNTER — Ambulatory Visit

## 2024-04-19 DIAGNOSIS — E538 Deficiency of other specified B group vitamins: Secondary | ICD-10-CM

## 2024-04-27 ENCOUNTER — Other Ambulatory Visit

## 2024-04-27 ENCOUNTER — Encounter: Payer: Self-pay | Admitting: Oncology

## 2024-04-27 ENCOUNTER — Ambulatory Visit: Admitting: Oncology

## 2024-04-27 ENCOUNTER — Inpatient Hospital Stay

## 2024-04-27 VITALS — BP 145/94 | HR 92 | Temp 98.5°F | Ht 60.0 in | Wt 124.5 lb

## 2024-04-27 DIAGNOSIS — C73 Malignant neoplasm of thyroid gland: Secondary | ICD-10-CM | POA: Diagnosis not present

## 2024-04-27 DIAGNOSIS — Z08 Encounter for follow-up examination after completed treatment for malignant neoplasm: Secondary | ICD-10-CM | POA: Diagnosis not present

## 2024-04-27 DIAGNOSIS — D649 Anemia, unspecified: Secondary | ICD-10-CM | POA: Diagnosis not present

## 2024-04-27 DIAGNOSIS — E890 Postprocedural hypothyroidism: Secondary | ICD-10-CM

## 2024-04-27 DIAGNOSIS — C539 Malignant neoplasm of cervix uteri, unspecified: Secondary | ICD-10-CM | POA: Diagnosis not present

## 2024-04-27 DIAGNOSIS — N1832 Chronic kidney disease, stage 3b: Secondary | ICD-10-CM

## 2024-04-27 LAB — COMPREHENSIVE METABOLIC PANEL WITH GFR
ALT: 5 U/L (ref 0–44)
AST: 17 U/L (ref 15–41)
Albumin: 3.4 g/dL — ABNORMAL LOW (ref 3.5–5.0)
Alkaline Phosphatase: 76 U/L (ref 38–126)
Anion gap: 14 (ref 5–15)
BUN: 14 mg/dL (ref 8–23)
CO2: 23 mmol/L (ref 22–32)
Calcium: 8.2 mg/dL — ABNORMAL LOW (ref 8.9–10.3)
Chloride: 103 mmol/L (ref 98–111)
Creatinine, Ser: 1.58 mg/dL — ABNORMAL HIGH (ref 0.44–1.00)
GFR, Estimated: 34 mL/min — ABNORMAL LOW
Glucose, Bld: 95 mg/dL (ref 70–99)
Potassium: 4.1 mmol/L (ref 3.5–5.1)
Sodium: 140 mmol/L (ref 135–145)
Total Bilirubin: <0.2 mg/dL (ref 0.0–1.2)
Total Protein: 6.8 g/dL (ref 6.5–8.1)

## 2024-04-27 LAB — CBC (CANCER CENTER ONLY)
HCT: 32.8 % — ABNORMAL LOW (ref 36.0–46.0)
Hemoglobin: 11.2 g/dL — ABNORMAL LOW (ref 12.0–15.0)
MCH: 32.1 pg (ref 26.0–34.0)
MCHC: 34.1 g/dL (ref 30.0–36.0)
MCV: 94 fL (ref 80.0–100.0)
Platelet Count: 435 K/uL — ABNORMAL HIGH (ref 150–400)
RBC: 3.49 MIL/uL — ABNORMAL LOW (ref 3.87–5.11)
RDW: 14 % (ref 11.5–15.5)
WBC Count: 8 K/uL (ref 4.0–10.5)
nRBC: 0 % (ref 0.0–0.2)

## 2024-04-27 LAB — T4, FREE: Free T4: 1.37 ng/dL (ref 0.80–2.00)

## 2024-04-27 LAB — TSH: TSH: 0.349 u[IU]/mL — ABNORMAL LOW (ref 0.350–4.500)

## 2024-04-27 NOTE — Assessment & Plan Note (Signed)
 Not a surgical candidate. Currently on concurrent chemotherapy and radiation.  S/p concurrent chemotherapy radiation  Clinically she is doing well.  Follow up with Gynonc for surveillance.

## 2024-04-27 NOTE — Assessment & Plan Note (Signed)
 Stage 3 CKD, likely due to hypertension and diabetes. Patient and family informed, patient anxious. - Ordered iron  studies for anemia evaluation. - Recommended adequate hydration. - Advised NSAID avoidance. - Recommended optimal hypertension and diabetes control.

## 2024-04-27 NOTE — Assessment & Plan Note (Signed)
"   Continue levothyroxine          "

## 2024-04-27 NOTE — Assessment & Plan Note (Addendum)
 Had a lengthy discussion with the patient.  I highly recommend patient to reestablish care with Dr.Frieze, Krystal Fallow for management of thyroid  cancer surveillance.  Patient declined. Recommend to check ultrasound thyroid /soft tissue neck-it was ordered but patient did not get it done. TSH and T4 were recently checked by primary care provider. Check thyroglobulin level. I referred patient to establish care with endocrinology Dr. Damian.  She was seen by Dr. Damian in 2016 for diabetes.-Patient did not reestablish care.

## 2024-04-27 NOTE — Progress Notes (Signed)
 " Hematology/Oncology Progress note [phone] [phone]      Patient Care Team: Melvin Pao, NP as PCP - General (Nurse Practitioner) Argentina Clap, MD as PCP - Cardiology (Cardiology) Maurie Rayfield BIRCH, RN as Oncology Nurse Navigator Babara Call, MD as Consulting Physician (Oncology) Lenn Aran, MD as Consulting Physician (Radiation Oncology) Dasie Tinnie MATSU, NP as Nurse Practitioner (Nurse Practitioner) Mancil Barter, MD as Referring Physician (Obstetrics)  REFERRING PROVIDER: Melvin Pao, NP  CHIEF COMPLAINTS/REASON FOR VISIT:  Follow up for treatment of cervical cancer   ASSESSMENT & PLAN:   Thyroid  cancer [facility]) Had a lengthy discussion with the patient.  I highly recommend patient to reestablish care with Dr.Frieze, Krystal Fallow for management of thyroid  cancer surveillance.  Patient declined. Recommend to check ultrasound thyroid /soft tissue neck-it was ordered but patient did not get it done. TSH and T4 were recently checked by primary care provider. Check thyroglobulin level. I referred patient to establish care with endocrinology Dr. Damian.  She was seen by Dr. Damian in 2016 for diabetes.-Patient did not reestablish care.  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 Currently on concurrent chemotherapy and radiation.  S/p concurrent chemotherapy radiation  Clinically she is doing well.  Follow up with Gynonc for surveillance.  Post-surgical hypothyroidism Continue levothyroxine .   Normocytic anemia Possibly secondary to chronic kidney disease. Recommend patient to continue oral iron  supplementation ferrous sulfate  325 mg twice daily. Check protein electrophoresis, light chain   Stage 3b chronic kidney disease (HCC) Stage 3 CKD, likely due to hypertension and diabetes. Patient and family informed, patient anxious. - Ordered iron  studies for anemia evaluation. - Recommended adequate hydration. -  Advised NSAID avoidance. - Recommended optimal hypertension and diabetes control.  Orders Placed This Encounter  Procedures   CBC (Cancer Center Only)    Standing Status:   Future    Expected Date:   10/26/2024    Expiration Date:   01/24/2025   Comprehensive metabolic panel with GFR    Standing Status:   Future    Expected Date:   10/26/2024    Expiration Date:   01/24/2025   T4, free    Standing Status:   Future    Expected Date:   10/26/2024    Expiration Date:   01/24/2025   Thyroglobulin Level    Standing Status:   Future    Expected Date:   10/26/2024    Expiration Date:   01/24/2025   TSH    Standing Status:   Future    Expected Date:   10/26/2024    Expiration Date:   01/24/2025   Thyroglobulin antibody    Standing Status:   Future    Expected Date:   10/26/2024    Expiration Date:   01/24/2025   Iron  and TIBC    Standing Status:   Future    Expected Date:   04/28/2024    Expiration Date:   07/27/2024   Ferritin    Standing Status:   Future    Expected Date:   04/28/2024    Expiration Date:   07/27/2024   Retic Panel    Standing Status:   Future    Expected Date:   04/28/2024    Expiration Date:   07/27/2024   Follow-up in 6 months All questions were answered. The patient knows to call the clinic with any problems, questions or concerns.  Call Babara, MD, PhD [facility] Hematology Oncology 04/27/2024   HISTORY OF PRESENTING ILLNESS:  Oncology History  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HCC)  12/23/2020 Initial Diagnosis   Invasive carcinoma of cervix 12/01/2020-8/9/022 She was admitted from the ED for 1 day h/o right lower abdominal / pelvic pain . 12/01/2020 CT abdomen pelvis w contrast showed  thickly septated right ovarian mass measuring 5.1 x 5.1 cm. 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 Recommend initial pelvic ultrasound and likely subsequent MRI to further evaluate, which may be performed on a nonemergent basis. 2. Air within the fundal endometrial cavity.  Correlate for recent instrumentation. 3. Pancolonic diverticulosis without evidence of acute diverticulitis.   12/01/2020 Fx D+C  and cervical bx. Uterus sounded to 9 cm.    TVUS POD#1 showed a complex right ovarian cyst with normal doppler flow Pathology- squamous cell CA of the cervix, as well as cancer in endocervix curettage and endometrial curettings    Patient was initially treatment for urosepsis, urine culture came back negative She was treated with IV antibiotics and transitioned to Augmentin  and Flagyl .    Diastolic CHF and Diabetes.  UDS + for cocaine.    She is the oldest of 7 kids (5 girls and 2 boys). She has 3 children - all SVDs - 2 boys and one girl.   She was seen by GynOnc Dr.Secord.  Her pelvic examination showed On palpations suspect lateral vaginal disease >50% down the vault and disease involving the left fornix. Cervix enlarged >4 cm with grossly obvious tumor and hard to palpation. Uterus - may be enlarged with firm mass on the right aspect versus palpation of the right adnexal mass. Positive parametrial involvement on the right and on the left with disease to or almost to the sidewall on the left. Rectovaginal exam was confirmatory.     # 8/31 2022, PET scan showed signs of cervical cancer with diffuse involvement of the uterus.  Cystic and solid right ovarian lesion more likely related to diffuse cervical cancer.  Concomitant synchronous cystic ovarian neoplasm could have appearance but feel less likely given the diffuse nature of disease throughout the uterus and cervix.  Small lymph nodes in the pelvis in the left pelvis suspicious for nodal involvement at the common iliac level.  No solid organ distant metastasis. Right lung renal consult and glossotonsillar sulcus uptake which is asymmetric and with increased fullness.  While this may be physiologic, will suggest direct visualization for further evaluation to exclude neoplasm. Right thyroid  uptake with visible  nodule.  Recommend ultrasound thyroid  and biopsy.   Patient has had baseline audiogram done and chemotherapy class. 01/03/2021 cisplatin  20 mg/m2 today.  Dose were reduced to 50% due to her impaired kidney function.  Treatment plan was switched to carboplatin  given her decreased kidney function 01/10/2021 carboplatin . Kidney function improved. 01/17/2021 cisplatin .  02/01/2021 cisplatin  02/08/2021 cisplatin  Last radiation 02/08/2021.   PD-L1 CPS 20%   01/17/2021 - 02/08/2021, concurrent chemoradiation to cervical cancer. S/p intracavitary radiation at [facility] with Dr. Lurline.  Patient follows up with gynecology oncology Dr. Elby for surveillance.   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 Background: There is not a family history of thyroid  disease. There is not a history of x-ray exposure to the head or neck area. There is not a history of prior nuclear radiation exposure. Baseline thyroid /neck mass symptoms include anterior neck swelling, but not anterior neck pain, anterior neck tenderness, difficulty swallowing/dysphagia, painful swallowing/odynophagia, globus, hoarseness, voice change/dysphonia, cough, positional dyspnea, and persistent anterior cervical lymphadenopathy. Oncologic history: 03/09/14 US  Thyroid  + Neck @ [facility] with right nodule at 3.5x2.5x3.3-cm, left mid-lobe/lateral nodule at 2.1x1.1x1.4-cm, left mid-lobe/medial nodule at 0.8x0.7x0.8-cm, left mid-inferior nodule at 0.7x0.6x0.6-cm, and left inferior pole nodule  at 0.9x0.9x1.2-cm and no nodal findings  04/25/14 US -guided Thyroid  biopsies of right mid-lobe 3.5-cm nodule and left mid-lobe/lateral nodule at 2.1-cm @ [facility] by Dr Therisa Eleanor Leep with benign cytology / Bethesda II for both nodules through [facility]  11/17/14 US  Thyroid  + Neck @ [facility] with right dominant nodule at 3.6x2.3x2.8-cm with adjacent nodules at 0.9x0.5x0.7-cm and 0.9x0.5x0.8-cm, left mid-lobe/lateral nodule at 1.8x1.1x1.5-cm, left mid-lobe/medial nodule at 1.1x0.7x0.8-cm, left mid-inferior nodule at 0.7x0.6x0.6-cm, and left inferior nodule at 1.1x0.x1.0-cm and no nodal findings  +12/27/20 FDG PET thru [place] reporting hypermetabolic right thyroid  nodule at 2.8-cm with SUV max of 5.9 with nodularity of left lobe, asymmetric fullness of right glossotonsillar sulcus with SUV max of 7.6, no cervical lymphadenopathy, no pulmonary nodules, and multiple sites of abdominal and pelvic activity  +02/20/21 US  Thyroid  @ [facility]) reporting right mid-lobe 4.5x0.9x3.3-cm nodule with TIRADS 4/4, right inferior 1.0x0.7x0.9-cm nodule with TIRADS 3/3, 2nd right inferior 1.1x1.0x0.9-cm nodule with TIRADS 3/3, left mid-lobe 1.3x1.0x0.7-cm nodule with TIRADS 4/4, 2nd left mid-lobe 0.9x0.7x0.7-cm nodule with TIRADS 4/4, and left inferior 1.2x1.0x1.0-cm nodule with TIRADS 3/3  +03/05/21 US -guided biopsy of right mid-lobe thyroid  nodule @ [facility]) with indeterminate cytology / Bethesda IV as Suspicious for Follicular Neoplasm (SFN) with subsequent molecular testing thru [facility] using ThyroSeq V3 GC positive for HRAS Q61R and TERT mutations thus high (>95) probability of cancer  +05/09/21 FDG PET thru [facility] with persistent hypermetabolic right thyroid  nodule with SUV max of 5.0, left thyroid  nodularity without PET correlation, no cervical lymphadenopathy, and improved abdominopelvic findings  +09/17/21 CT Chest/Abdomen/Pelvis with contrast  thru [facility] reporting persistent right thyroid  nodule at 2.8-cm, and new findings of right lower lobe lung nodule at 0.5-cm and left lingular lung nodule at 0.3-cm  10/29/21  Physician-performed US  Thyroid  + Neck @ [facility] by Dr Toribio Novel with suspect nodular disease without suggestion of extrathyroidal extension or suspect cervical lymphadenopathy Flexible laryngoscopy @ [facility] by Dr Toribio Novel with normal vocal cord motion  12/18/21 Total thyroidectomy @ [facility] by Dr Toribio Novel with surgical pathology showing: 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) at 4.8x2.9x2.6-cm with no mitoses, no tumor necrosis, negative margins, no vascular invasion (-VI), no lymphatic invasion (-LI), no perineural invasion (-PNI), no extrathyroidal extension (-ETE), and no lymph nodes thus AJCC TNM staging of [id number] with additional findings of benign adenomatoid nodule(s) or nodular follicular disease and mild chronic lymphocytic thyroiditis  Multifocal left-sided Papillary Thyroid  Carcinoma (PTC) at 0.9-cm and 0.8-cm and 0.1-cm with no mitoses, no tumor necrosis, no vascular invasion (-VI), no lymphatic invasion (-LI), no perineural invasion (-PNI), no extrathyroidal extension (-ETE), and no lymph nodes thus AJCC TNM staging of [id number]   +01/09/22 CT Chest/Abdomen/Pelvis with contrast thru [place] reporting no thyroid , tracheal, or esophageal findings; no mediastinal or hilar lymphadenopathy, stable right lower lobe lung ground-glass  nodule at 0.3-cm, stable left upper lobe lung nodule at 0.3-cm, and stable left lower lobe lung nodule at 0.4-cm       Patient presents to reestablish care.  Referred by Dr. Elby as she has not been compliant following up with endocrinology oncology for history of thyroid  cancer. Discussed the use of AI scribe software for clinical note transcription with the patient, who gave verbal consent to proceed.  Patient accompanied by her  son.  Mild anemia was identified on recent laboratory studies, with hemoglobin noted to be slightly low. She expresses concern regarding the etiology of her anemia and has experienced difficulty obtaining iron  supplementation due to financial constraints, noting a recent increase in medication cost. She is currently unable to purchase her iron  pills until her next check arrives, despite a prescription for 180 tablets issued on April 12, 2024. She recently completed a series of five vitamin B12 injections, with the last administered last Monday. She does not endorse abnormal bleeding, bruising, or symptoms directly attributable to anemia, but expresses significant distress regarding her health and the complexity of her care.  Chronic kidney disease has been present since at least April 2025, and possibly as early as 2023, based on prior laboratory findings. She was unaware of this diagnosis until the current visit and expresses frustration regarding lack of communication among her providers. She does not take NSAIDs and is actively involved in discussions regarding her care, with her son present during the visit. She feels overwhelmed by her multiple health issues and the challenges of coordinating care.  Chronic hepatitis C was diagnosed in June 2025, with no treatment initiated to date. She is scheduled to see a gastroenterologist in February 2026 and expresses concern about the delay in treatment and the safety of having untreated hepatitis for an extended period. She recalls being told by a previous provider that she may have had hepatitis for many years.  She expresses significant emotional distress, including fear of dying and feeling overwhelmed by the complexity of her medical care and financial difficulties.   Review of Systems  Constitutional:  Positive for fatigue. Negative for appetite change, chills and fever.  HENT:   Negative for hearing loss and voice change.   Eyes:  Negative for eye  problems.  Respiratory:  Negative for chest tightness and cough.   Cardiovascular:  Negative for chest pain.  Gastrointestinal:  Negative for abdominal distention, abdominal pain and blood in stool.  Endocrine: Negative for hot flashes.  Genitourinary:  Negative for difficulty urinating and frequency.   Musculoskeletal:  Negative for arthralgias.       Chronic intermitted left leg pain  Skin:  Negative for itching and rash.  Neurological:  Positive for numbness. Negative for extremity weakness and light-headedness.  Hematological:  Negative for adenopathy.  Psychiatric/Behavioral:  Negative for confusion.        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, cN1, cM0); Tx'd with Cisplatin  + EBRT   Chest pain, unspecified    CHF (congestive heart failure) (HCC)    Chronic diastolic heart failure, NYHA class 2 (HCC)    Cocaine use    none since Cancer dx (11/2021)   Diabetes mellitus without complication (HCC)    Edentulous    lost dentures   Grade I diastolic dysfunction  Hepatitis    Hip pain    HLD (hyperlipidemia)    Hyperplastic colon polyp    Hypertension    Hypothyroidism    Multinodular goiter    Port-A-Cath in place    Thyroid  cancer (HCC)    Thyroid  mass    a.) 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)   Tubular adenoma of colon     SURGICAL HISTORY: Past Surgical History:  Procedure Laterality Date   ABDOMINAL HYSTERECTOMY     CATARACT EXTRACTION W/PHACO Left 08/14/2023   Procedure: PHACOEMULSIFICATION, CATARACT, WITH IOL INSERTION  3.93  00:31.6;  Surgeon: Enola Feliciano Hugger, MD;  Location: [facility];  Service: Ophthalmology;  Laterality: Left;   CATARACT EXTRACTION W/PHACO Right 08/28/2023   Procedure: PHACOEMULSIFICATION, CATARACT, WITH IOL INSERTION  2.67 00:21.9;  Surgeon: Enola Feliciano Hugger, MD;  Location: [facility];  Service: Ophthalmology;  Laterality: Right;   COLONOSCOPY WITH PROPOFOL  N/A 11/24/2015   Procedure: COLONOSCOPY WITH PROPOFOL ;  Surgeon: Gladis RAYMOND Mariner, MD;  Location: [facility] ENDOSCOPY;  Service: Endoscopy;  Laterality: N/A;   COLONOSCOPY WITH PROPOFOL  N/A 03/18/2019   Procedure: COLONOSCOPY WITH PROPOFOL ;  Surgeon: Janalyn Keene NOVAK, MD;  Location: [facility] ENDOSCOPY;  Service: Endoscopy;  Laterality: N/A;   DILATION AND CURETTAGE OF UTERUS N/A 12/01/2020   Procedure: DILATATION AND CURETTAGE with cervical biopsies;  Surgeon: Schermerhorn, Debby PARAS, MD;  Location: [facility] ORS;  Service: Gynecology;  Laterality: N/A;   ESOPHAGOGASTRODUODENOSCOPY (EGD) WITH PROPOFOL  N/A 03/18/2019   Procedure: ESOPHAGOGASTRODUODENOSCOPY (EGD) WITH PROPOFOL ;  Surgeon: Janalyn Keene NOVAK, MD;  Location: [facility] ENDOSCOPY;  Service: Endoscopy;  Laterality: N/A;   PORTA CATH INSERTION N/A 01/16/2021   Procedure: PORTA CATH INSERTION;  Surgeon: Jama Cordella MATSU, MD;  Location: [facility] INVASIVE CV LAB;  Service: Cardiovascular;  Laterality: N/A;   PORTA CATH REMOVAL N/A 10/28/2023   Procedure: PORTA CATH REMOVAL;  Surgeon: Jama Cordella MATSU, MD;  Location: [facility] INVASIVE CV LAB;  Service: Cardiovascular;  Laterality: N/A;   right ankle orif     THYROIDECTOMY, COMPLETION Bilateral 12/18/2021    SOCIAL HISTORY: Social History   Socioeconomic History   Marital status: Legally Separated    Spouse name: Not on file   Number of children: Not on file   Years of education: Not on file   Highest education level: Not on file  Occupational History   Not on file  Tobacco Use   Smoking status: Every Day    Current packs/day: 0.10    Average packs/day: 0.1 packs/day for 60.0 years (6.0 ttl pk-yrs)    Types: Cigarettes    Start date: 04/29/1964    Passive exposure: Never   Smokeless tobacco: Never  Vaping Use   Vaping status: Never Used   Substance and Sexual Activity   Alcohol use: Not Currently   Drug use: Yes    Types: Crack cocaine    Comment: Patient is trying to quit   Sexual activity: Not Currently  Other Topics Concern   Not on file  Social History Narrative   Not on file   Social Drivers of Health   Tobacco Use: High Risk (04/27/2024)   Patient History    Smoking Tobacco Use: Every Day    Smokeless Tobacco Use: Never    Passive Exposure: Never  Financial Resource Strain: Not on file  Food Insecurity: No Food Insecurity (10/15/2023)   Epic    Worried About Radiation Protection Practitioner of Food in the Last Year: Never true  Ran Out of Food in the Last Year: Never true  Transportation Needs: No Transportation Needs (10/15/2023)   Epic    Lack of Transportation (Medical): No    Lack of Transportation (Non-Medical): No  Physical Activity: Not on file  Stress: Not on file  Social Connections: Not on file  Intimate Partner Violence: Not on file  Depression (PHQ2-9): Medium Risk (04/27/2024)   Depression (PHQ2-9)    PHQ-2 Score: 9  Alcohol Screen: Not on file  Housing: Unknown (10/15/2023)   Epic    Unable to Pay for Housing in the Last Year: No    Number of Times Moved in the Last Year: Not on file    Homeless in the Last Year: No  Utilities: Not At Risk (10/15/2023)   Epic    Threatened with loss of utilities: No  Health Literacy: Not on file    FAMILY HISTORY: Family History  Problem Relation Age of Onset   Hypertension Mother    Breast cancer Maternal Aunt    Breast cancer Paternal Aunt    Hypertension Maternal Grandfather     ALLERGIES:  is allergic to oxybutynin , pregabalin , ace inhibitors, and cyclobenzaprine.  MEDICATIONS:  Current Outpatient Medications  Medication Sig Dispense Refill   aspirin EC 81 MG tablet Take 81 mg by mouth daily.     calcitRIOL  (ROCALTROL ) 0.25 MCG capsule Take 1 capsule (0.25 mcg total) by mouth daily. 90 capsule 0   Calcium  Carbonate-Vitamin D 500-125 MG-UNIT TABS Take  1 tablet by mouth daily.     ferrous sulfate  (FEROSUL) 325 (65 FE) MG tablet Take 1 tablet (325 mg total) by mouth 2 (two) times daily with a meal. 180 tablet 0   hydrOXYzine  (ATARAX ) 25 MG tablet Take 1 tablet (25 mg total) by mouth daily. Take 25mg . By mouth every morning 90 tablet 0   levothyroxine  (SYNTHROID ) 112 MCG tablet Take 1 tablet (112 mcg total) by mouth daily. 90 tablet 0   losartan  (COZAAR ) 50 MG tablet Take 1 tablet (50 mg total) by mouth 2 (two) times daily. 180 tablet 0   metFORMIN  (GLUCOPHAGE ) 500 MG tablet Take 1 tablet (500 mg total) by mouth 2 (two) times daily with a meal. 180 tablet 0   mirtazapine  (REMERON ) 15 MG tablet Take 1 tablet (15 mg total) by mouth at bedtime. 90 tablet 0   ondansetron  (ZOFRAN ) 4 MG tablet Take 1 tablet (4 mg total) by mouth every 6 (six) hours as needed for nausea. 20 tablet 0   simvastatin  (ZOCOR ) 20 MG tablet Take 1 tablet (20 mg total) by mouth daily. 90 tablet 0   No current facility-administered medications for this visit.     PHYSICAL EXAMINATION: ECOG PERFORMANCE STATUS: 1 - Symptomatic but completely ambulatory Vitals:   04/27/24 1444 04/27/24 1455  BP: (!) 131/100 (!) 145/94  Pulse: 92   Temp: 98.5 F (36.9 C)   SpO2: 99%    Filed Weights   04/27/24 1444  Weight: 124 lb 8 oz (56.5 kg)    Physical Exam Constitutional:      General: She is not in acute distress.    Comments: She is able to ambulate with a walker  HENT:     Head: Normocephalic and atraumatic.  Eyes:     General: No scleral icterus. Cardiovascular:     Rate and Rhythm: Normal rate and regular rhythm.  Pulmonary:     Effort: Pulmonary effort is normal. No respiratory distress.     Breath sounds: Normal breath  sounds.  Abdominal:     General: Bowel sounds are normal. There is no distension.     Palpations: Abdomen is soft.  Musculoskeletal:        General: No deformity. Normal range of motion.     Cervical back: Normal range of motion and neck supple.   Skin:    General: Skin is warm and dry.     Findings: No erythema or rash.  Neurological:     Mental Status: She is alert and oriented to person, place, and time. Mental status is at baseline.     LABORATORY DATA:  I have reviewed the data as listed Lab Results  Component Value Date   WBC 8.0 04/27/2024   HGB 11.2 (L) 04/27/2024   HCT 32.8 (L) 04/27/2024   MCV 94.0 04/27/2024   PLT 435 (H) 04/27/2024   Recent Labs    08/02/23 1114 09/10/23 0954 10/15/23 1016 03/22/24 1037 04/27/24 1431  NA 140   < > 141 140 140  K 4.8   < > 4.4 5.0 4.1  CL 105   < > 102 103 103  CO2 23   < > 21 18* 23  GLUCOSE 74   < > 84 80 95  BUN 22   < > 18 14 14   CREATININE 1.79*   < > 1.91* 1.73* 1.58*  CALCIUM  6.8*   < > 9.2 8.3* 8.2*  GFRNONAA 29*  --   --   --  34*  PROT 7.2   < > 6.8 6.3 6.8  ALBUMIN 3.6   < > 4.1 3.7* 3.4*  AST 19   < > 20 16 17   ALT 12   < > 14 7 5   ALKPHOS 59   < > 73 62 76  BILITOT 0.4   < > 0.2 <0.2 <0.2   < > = values in this interval not displayed.        RADIOGRAPHIC STUDIES: I have personally reviewed the radiological images as listed and agreed with the findings in the report. No results found.  "

## 2024-04-27 NOTE — Assessment & Plan Note (Signed)
 Possibly secondary to chronic kidney disease. Recommend patient to continue oral iron  supplementation ferrous sulfate  325 mg twice daily. Check protein electrophoresis, light chain

## 2024-04-28 ENCOUNTER — Other Ambulatory Visit: Payer: Self-pay

## 2024-04-28 DIAGNOSIS — C539 Malignant neoplasm of cervix uteri, unspecified: Secondary | ICD-10-CM

## 2024-04-28 LAB — IRON AND TIBC
Iron: 43 ug/dL (ref 28–170)
Saturation Ratios: 19 % (ref 10.4–31.8)
TIBC: 235 ug/dL — ABNORMAL LOW (ref 250–450)
UIBC: 192 ug/dL

## 2024-04-28 LAB — FERRITIN: Ferritin: 38 ng/mL (ref 11–307)

## 2024-04-28 LAB — RETIC PANEL
Immature Retic Fract: 8.6 % (ref 2.3–15.9)
RBC.: 3.57 MIL/uL — ABNORMAL LOW (ref 3.87–5.11)
Retic Count, Absolute: 41.8 K/uL (ref 19.0–186.0)
Retic Ct Pct: 1.2 % (ref 0.4–3.1)
Reticulocyte Hemoglobin: 32.2 pg

## 2024-04-28 LAB — THYROGLOBULIN ANTIBODY: Thyroglobulin Antibody: 7.6 [IU]/mL — ABNORMAL HIGH (ref 0.0–0.9)

## 2024-05-05 LAB — THYROGLOBULIN LEVEL: Thyroglobulin: <2 ng/mL

## 2024-05-17 ENCOUNTER — Emergency Department

## 2024-05-17 ENCOUNTER — Observation Stay

## 2024-05-17 ENCOUNTER — Encounter: Payer: Self-pay | Admitting: Intensive Care

## 2024-05-17 ENCOUNTER — Other Ambulatory Visit: Payer: Self-pay

## 2024-05-17 ENCOUNTER — Inpatient Hospital Stay
Admission: EM | Admit: 2024-05-17 | Discharge: 2024-05-21 | DRG: 853 | Disposition: A | Discharge status: Home / Self Care | Attending: Osteopathic Medicine | Admitting: Osteopathic Medicine

## 2024-05-17 ENCOUNTER — Encounter: Payer: Self-pay | Admitting: Oncology

## 2024-05-17 DIAGNOSIS — Z923 Personal history of irradiation: Secondary | ICD-10-CM

## 2024-05-17 DIAGNOSIS — E1122 Type 2 diabetes mellitus with diabetic chronic kidney disease: Secondary | ICD-10-CM | POA: Diagnosis present

## 2024-05-17 DIAGNOSIS — Z888 Allergy status to other drugs, medicaments and biological substances status: Secondary | ICD-10-CM

## 2024-05-17 DIAGNOSIS — B1920 Unspecified viral hepatitis C without hepatic coma: Secondary | ICD-10-CM | POA: Diagnosis present

## 2024-05-17 DIAGNOSIS — J189 Pneumonia, unspecified organism: Secondary | ICD-10-CM | POA: Diagnosis not present

## 2024-05-17 DIAGNOSIS — E119 Type 2 diabetes mellitus without complications: Secondary | ICD-10-CM

## 2024-05-17 DIAGNOSIS — Z7984 Long term (current) use of oral hypoglycemic drugs: Secondary | ICD-10-CM

## 2024-05-17 DIAGNOSIS — E782 Mixed hyperlipidemia: Secondary | ICD-10-CM | POA: Diagnosis present

## 2024-05-17 DIAGNOSIS — C73 Malignant neoplasm of thyroid gland: Secondary | ICD-10-CM | POA: Diagnosis present

## 2024-05-17 DIAGNOSIS — D75839 Thrombocytosis, unspecified: Secondary | ICD-10-CM | POA: Diagnosis present

## 2024-05-17 DIAGNOSIS — Z8541 Personal history of malignant neoplasm of cervix uteri: Secondary | ICD-10-CM

## 2024-05-17 DIAGNOSIS — Z90710 Acquired absence of both cervix and uterus: Secondary | ICD-10-CM

## 2024-05-17 DIAGNOSIS — D631 Anemia in chronic kidney disease: Secondary | ICD-10-CM | POA: Diagnosis present

## 2024-05-17 DIAGNOSIS — T17890A Other foreign object in other parts of respiratory tract causing asphyxiation, initial encounter: Secondary | ICD-10-CM | POA: Diagnosis present

## 2024-05-17 DIAGNOSIS — Z85850 Personal history of malignant neoplasm of thyroid: Secondary | ICD-10-CM

## 2024-05-17 DIAGNOSIS — I5032 Chronic diastolic (congestive) heart failure: Secondary | ICD-10-CM | POA: Diagnosis present

## 2024-05-17 DIAGNOSIS — C7802 Secondary malignant neoplasm of left lung: Secondary | ICD-10-CM | POA: Diagnosis present

## 2024-05-17 DIAGNOSIS — Z79890 Hormone replacement therapy (postmenopausal): Secondary | ICD-10-CM

## 2024-05-17 DIAGNOSIS — Z803 Family history of malignant neoplasm of breast: Secondary | ICD-10-CM

## 2024-05-17 DIAGNOSIS — Z7982 Long term (current) use of aspirin: Secondary | ICD-10-CM

## 2024-05-17 DIAGNOSIS — C539 Malignant neoplasm of cervix uteri, unspecified: Secondary | ICD-10-CM | POA: Diagnosis present

## 2024-05-17 DIAGNOSIS — N1832 Chronic kidney disease, stage 3b: Secondary | ICD-10-CM | POA: Diagnosis present

## 2024-05-17 DIAGNOSIS — F17210 Nicotine dependence, cigarettes, uncomplicated: Secondary | ICD-10-CM | POA: Diagnosis present

## 2024-05-17 DIAGNOSIS — Z8249 Family history of ischemic heart disease and other diseases of the circulatory system: Secondary | ICD-10-CM

## 2024-05-17 DIAGNOSIS — E890 Postprocedural hypothyroidism: Secondary | ICD-10-CM | POA: Diagnosis present

## 2024-05-17 DIAGNOSIS — N179 Acute kidney failure, unspecified: Secondary | ICD-10-CM | POA: Diagnosis not present

## 2024-05-17 DIAGNOSIS — I130 Hypertensive heart and chronic kidney disease with heart failure and stage 1 through stage 4 chronic kidney disease, or unspecified chronic kidney disease: Secondary | ICD-10-CM | POA: Diagnosis present

## 2024-05-17 DIAGNOSIS — Z860100 Personal history of colon polyps, unspecified: Secondary | ICD-10-CM

## 2024-05-17 DIAGNOSIS — A419 Sepsis, unspecified organism: Secondary | ICD-10-CM | POA: Diagnosis not present

## 2024-05-17 DIAGNOSIS — Z9221 Personal history of antineoplastic chemotherapy: Secondary | ICD-10-CM

## 2024-05-17 DIAGNOSIS — C7801 Secondary malignant neoplasm of right lung: Secondary | ICD-10-CM | POA: Diagnosis present

## 2024-05-17 DIAGNOSIS — Z79899 Other long term (current) drug therapy: Secondary | ICD-10-CM

## 2024-05-17 DIAGNOSIS — R051 Acute cough: Secondary | ICD-10-CM

## 2024-05-17 DIAGNOSIS — Z1152 Encounter for screening for COVID-19: Secondary | ICD-10-CM

## 2024-05-17 DIAGNOSIS — I10 Essential (primary) hypertension: Secondary | ICD-10-CM | POA: Diagnosis present

## 2024-05-17 LAB — CBC
HCT: 34.5 % — ABNORMAL LOW (ref 36.0–46.0)
Hemoglobin: 11.5 g/dL — ABNORMAL LOW (ref 12.0–15.0)
MCH: 31.6 pg (ref 26.0–34.0)
MCHC: 33.3 g/dL (ref 30.0–36.0)
MCV: 94.8 fL (ref 80.0–100.0)
Platelets: 475 K/uL — ABNORMAL HIGH (ref 150–400)
RBC: 3.64 MIL/uL — ABNORMAL LOW (ref 3.87–5.11)
RDW: 14.4 % (ref 11.5–15.5)
WBC: 27.8 K/uL — ABNORMAL HIGH (ref 4.0–10.5)
nRBC: 0 % (ref 0.0–0.2)

## 2024-05-17 LAB — RESP PANEL BY RT-PCR (RSV, FLU A&B, COVID)  RVPGX2
Influenza A by PCR: NEGATIVE
Influenza B by PCR: NEGATIVE
Resp Syncytial Virus by PCR: NEGATIVE
SARS Coronavirus 2 by RT PCR: NEGATIVE

## 2024-05-17 LAB — BASIC METABOLIC PANEL WITH GFR
Anion gap: 15 (ref 5–15)
BUN: 19 mg/dL (ref 8–23)
CO2: 25 mmol/L (ref 22–32)
Calcium: 8.4 mg/dL — ABNORMAL LOW (ref 8.9–10.3)
Chloride: 99 mmol/L (ref 98–111)
Creatinine, Ser: 1.59 mg/dL — ABNORMAL HIGH (ref 0.44–1.00)
GFR, Estimated: 34 mL/min — ABNORMAL LOW
Glucose, Bld: 119 mg/dL — ABNORMAL HIGH (ref 70–99)
Potassium: 4.3 mmol/L (ref 3.5–5.1)
Sodium: 139 mmol/L (ref 135–145)

## 2024-05-17 LAB — LACTIC ACID, PLASMA: Lactic Acid, Venous: 1.7 mmol/L (ref 0.5–1.9)

## 2024-05-17 LAB — MAGNESIUM: Magnesium: 1.8 mg/dL (ref 1.7–2.4)

## 2024-05-17 MED ORDER — ONDANSETRON HCL 4 MG/2ML IJ SOLN: 4.0000 mg | Freq: Four times a day (QID) | INTRAMUSCULAR | Status: DC | PRN

## 2024-05-17 MED ORDER — LACTATED RINGERS IV BOLUS (SEPSIS)
1000.0000 mL | Freq: Once | INTRAVENOUS | Status: AC
  Administered 2024-05-17: 1000 mL via INTRAVENOUS

## 2024-05-17 MED ORDER — ONDANSETRON HCL 4 MG PO TABS: 4.0000 mg | ORAL_TABLET | Freq: Four times a day (QID) | ORAL | Status: DC | PRN

## 2024-05-17 MED ORDER — SODIUM CHLORIDE 0.9 % IV SOLN
2.0000 g | INTRAVENOUS | Status: DC
  Administered 2024-05-18 – 2024-05-20 (×3): 2 g via INTRAVENOUS
  Filled 2024-05-17 (×4): qty 20

## 2024-05-17 MED ORDER — SENNOSIDES-DOCUSATE SODIUM 8.6-50 MG PO TABS: 1.0000 | ORAL_TABLET | Freq: Every evening | ORAL | Status: DC | PRN

## 2024-05-17 MED ORDER — SODIUM CHLORIDE 0.9 % IV SOLN: 500.0000 mg | Freq: Once | INTRAVENOUS | Status: DC

## 2024-05-17 MED ORDER — HEPARIN SODIUM (PORCINE) 5000 UNIT/ML IJ SOLN
5000.0000 [IU] | Freq: Three times a day (TID) | INTRAMUSCULAR | Status: DC
  Administered 2024-05-17 – 2024-05-20 (×8): 5000 [IU] via SUBCUTANEOUS
  Filled 2024-05-17 (×8): qty 1

## 2024-05-17 MED ORDER — ACETAMINOPHEN 650 MG RE SUPP: 650.0000 mg | Freq: Four times a day (QID) | RECTAL | Status: DC | PRN

## 2024-05-17 MED ORDER — LACTATED RINGERS IV SOLN: INTRAVENOUS | Status: AC

## 2024-05-17 MED ORDER — LORAZEPAM 2 MG/ML IJ SOLN
1.0000 mg | Freq: Once | INTRAMUSCULAR | Status: AC
  Administered 2024-05-17: 1 mg via INTRAMUSCULAR
  Filled 2024-05-17: qty 1

## 2024-05-17 MED ORDER — ACETAMINOPHEN 325 MG PO TABS
650.0000 mg | ORAL_TABLET | Freq: Four times a day (QID) | ORAL | Status: DC | PRN
  Administered 2024-05-17 – 2024-05-21 (×4): 650 mg via ORAL
  Filled 2024-05-17 (×4): qty 2

## 2024-05-17 MED ORDER — AZITHROMYCIN 500 MG PO TABS
500.0000 mg | ORAL_TABLET | Freq: Every day | ORAL | Status: DC
  Administered 2024-05-18 – 2024-05-20 (×3): 500 mg via ORAL
  Filled 2024-05-17 (×3): qty 1

## 2024-05-17 MED ORDER — AZITHROMYCIN 500 MG PO TABS
500.0000 mg | ORAL_TABLET | Freq: Once | ORAL | Status: AC
  Administered 2024-05-17: 500 mg via ORAL
  Filled 2024-05-17: qty 1

## 2024-05-17 MED ORDER — IOHEXOL 350 MG/ML SOLN
60.0000 mL | Freq: Once | INTRAVENOUS | Status: AC | PRN
  Administered 2024-05-17: 75 mL via INTRAVENOUS

## 2024-05-17 MED ORDER — SODIUM CHLORIDE 0.9 % IV SOLN
2.0000 g | Freq: Once | INTRAVENOUS | Status: AC
  Administered 2024-05-17: 2 g via INTRAVENOUS
  Filled 2024-05-17: qty 20

## 2024-05-17 MED ORDER — BENZONATATE 100 MG PO CAPS
200.0000 mg | ORAL_CAPSULE | Freq: Once | ORAL | Status: AC
  Administered 2024-05-17: 200 mg via ORAL
  Filled 2024-05-17: qty 2

## 2024-05-17 MED ORDER — SODIUM CHLORIDE 0.9% FLUSH
3.0000 mL | Freq: Two times a day (BID) | INTRAVENOUS | Status: DC
  Administered 2024-05-17 – 2024-05-20 (×7): 3 mL via INTRAVENOUS

## 2024-05-17 NOTE — Consult Note (Signed)
 CODE SEPSIS - PHARMACY COMMUNICATION  **Broad Spectrum Antibiotics should be administered within 1 hour of Sepsis diagnosis**  Time Code Sepsis Called/Page Received: 1531  Antibiotics Ordered: azithromycin  and rocephin  x1  Time of 1st antibiotic administration: 1709  Additional action taken by pharmacy: Messaged RN, she stated there was issues getting IV access. Let her know patient also has PO abx that can be given as well.   If necessary, Name of Provider/Nurse Contacted: RN Camilla Federico Annabella LOISE Haynes ,PharmD Clinical Pharmacist  05/17/2024  3:58 PM

## 2024-05-17 NOTE — H&P (Addendum)
 " History and Physical    Rebecca Parker FMW:[id number] DOB: 01/22/1951 DOA: 05/17/2024  DOS: the patient was seen and examined on 05/17/2024  PCP: Melvin Pao, NP   Patient coming from: Home  I have personally briefly reviewed patient's old medical records in [facility] and CareEverywhere  HPI:   Rebecca Parker is a 74 y.o. year old female with medical history of hypertension, hyperlipidemia, type 2 diabetes, history of cervical cancer and thyroid  cancer presenting to the ED with cough and shortness of breath.  Patient reports this has been going on for over a month but during the last 4 to 5 days it has become worse.  Denies any fevers or chills.  Per daughter at bedside she states patient has been weak and just feeling unwell.  She reports decreased appetite.  When discussed missing some of her appointments and not scheduling some of the recommended appointments, daughter stated she did not know about this.  Given this was worsening, she presented to the ED for further evaluation. On arrival to the ED patient was noted to be HDS stable.  Lab work and imaging obtained.  CBC with significant leukocytosis at 27.8k, anemia at baseline.  There is mild thrombocytosis.  BMP with baseline renal function.  Magnesium  within normal limits.  Respiratory panel negative for COVID, flu, RSV.  Imaging showed new pulmonary mass that is 3.5 cm concerning for malignancy along with left upper lobe airspace opacity.  Given coughing, leukocytosis with opacity on imaging concerning for pneumonia,TRH contacted for admission.  Review of Systems: As mentioned in the history of present illness. All other systems reviewed and are negative.   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, cN1, cM0); Tx'd with Cisplatin  + EBRT   Chest pain, unspecified    CHF (congestive heart failure) (HCC)     Chronic diastolic heart failure, NYHA class 2 (HCC)    Cocaine use    none since Cancer dx (11/2021)   Diabetes mellitus without complication (HCC)    Edentulous    lost dentures   Grade I diastolic dysfunction    Hepatitis    Hip pain    HLD (hyperlipidemia)    Hyperplastic colon polyp    Hypertension    Hypothyroidism    Multinodular goiter    Port-A-Cath in place    Thyroid  cancer (HCC)    Thyroid  mass    a.) 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)   Tubular adenoma of colon     Past Surgical History:  Procedure Laterality Date   ABDOMINAL HYSTERECTOMY     CATARACT EXTRACTION W/PHACO Left 08/14/2023   Procedure: PHACOEMULSIFICATION, CATARACT, WITH IOL INSERTION  3.93  00:31.6;  Surgeon: Enola Feliciano Hugger, MD;  Location: [facility];  Service: Ophthalmology;  Laterality: Left;   CATARACT EXTRACTION W/PHACO Right 08/28/2023   Procedure: PHACOEMULSIFICATION, CATARACT, WITH IOL INSERTION 2.67 00:21.9;  Surgeon: Enola Feliciano Hugger, MD;  Location: [facility];  Service: Ophthalmology;  Laterality: Right;   COLONOSCOPY WITH PROPOFOL  N/A 11/24/2015   Procedure: COLONOSCOPY WITH PROPOFOL ;  Surgeon: Gladis RAYMOND Mariner, MD;  Location: [facility] ENDOSCOPY;  Service: Endoscopy;  Laterality: N/A;   COLONOSCOPY WITH PROPOFOL  N/A 03/18/2019   Procedure: COLONOSCOPY WITH PROPOFOL ;  Surgeon: Janalyn Keene NOVAK, MD;  Location: [facility] ENDOSCOPY;  Service: Endoscopy;  Laterality: N/A;   DILATION AND CURETTAGE OF UTERUS N/A 12/01/2020   Procedure: DILATATION AND CURETTAGE with cervical biopsies;  Surgeon: Schermerhorn, Debby PARAS, MD;  Location: [facility] ORS;  Service: Gynecology;  Laterality: N/A;   ESOPHAGOGASTRODUODENOSCOPY (EGD) WITH PROPOFOL  N/A 03/18/2019   Procedure: ESOPHAGOGASTRODUODENOSCOPY (EGD) WITH PROPOFOL ;  Surgeon: Janalyn Keene NOVAK, MD;  Location: [facility] ENDOSCOPY;  Service: Endoscopy;  Laterality: N/A;   PORTA CATH INSERTION N/A  01/16/2021   Procedure: PORTA CATH INSERTION;  Surgeon: Jama Cordella MATSU, MD;  Location: [facility] INVASIVE CV LAB;  Service: Cardiovascular;  Laterality: N/A;   PORTA CATH REMOVAL N/A 10/28/2023   Procedure: PORTA CATH REMOVAL;  Surgeon: Jama Cordella MATSU, MD;  Location: [facility] INVASIVE CV LAB;  Service: Cardiovascular;  Laterality: N/A;   right ankle orif     THYROIDECTOMY, COMPLETION Bilateral 12/18/2021     Allergies[1]  Family History  Problem Relation Age of Onset   Hypertension Mother    Breast cancer Maternal Aunt    Breast cancer Paternal Aunt    Hypertension Maternal Grandfather     Prior to Admission medications  Medication Sig Start Date End Date Taking? Authorizing Provider  aspirin EC 81 MG tablet Take 81 mg by mouth daily.   Yes [provider]  calcitRIOL  (ROCALTROL ) 0.25 MCG capsule Take 1 capsule (0.25 mcg total) by mouth daily. 04/06/24  Yes Melvin Pao, NP  Calcium  Carbonate-Vitamin D 500-125 MG-UNIT TABS Take 1 tablet by mouth daily.   Yes [provider]  ferrous sulfate  (FEROSUL) 325 (65 FE) MG tablet Take 1 tablet (325 mg total) by mouth 2 (two) times daily with a meal. 04/12/24  Yes Melvin Pao, NP  hydrOXYzine  (ATARAX ) 25 MG tablet Take 1 tablet (25 mg total) by mouth daily. Take 25mg . By mouth every morning 04/12/24  Yes Melvin Pao, NP  levothyroxine  (SYNTHROID ) 112 MCG tablet Take 1 tablet (112 mcg total) by mouth daily. 04/12/24  Yes Melvin Pao, NP  losartan  (COZAAR ) 50 MG tablet Take 1 tablet (50 mg total) by mouth 2 (two) times daily. 10/02/23  Yes Melvin Pao, NP  metFORMIN  (GLUCOPHAGE ) 500 MG tablet Take 1 tablet (500 mg total) by mouth 2 (two) times daily with a meal. 04/12/24  Yes Melvin Pao, NP  mirtazapine  (REMERON ) 15 MG tablet Take 1 tablet (15 mg total) by mouth at bedtime. 04/06/24  Yes Melvin Pao, NP  simvastatin  (ZOCOR ) 20 MG tablet Take 1 tablet (20 mg total) by mouth daily. 10/02/23  Yes  Melvin Pao, NP  ondansetron  (ZOFRAN ) 4 MG tablet Take 1 tablet (4 mg total) by mouth every 6 (six) hours as needed for nausea. Patient not taking: Reported on 05/17/2024 10/02/23   Melvin Pao, NP  prochlorperazine  (COMPAZINE ) 10 MG tablet Take 1 tablet (10 mg total) by mouth every 6 (six) hours as needed (Nausea or vomiting). 01/03/21 01/03/21  Babara Call, MD    Social History:  reports that she has quit smoking. Her smoking use included cigarettes. She started smoking about 60 years ago. She has a 6 pack-year smoking history. She has never been exposed to tobacco smoke. She has never used smokeless tobacco. She reports that she does not currently use alcohol. She reports current drug use. Drug: Crack cocaine. Lives with son.  Does not currently smoke and last smoked in 2022.  Drinks rarely.   Physical Exam: Vitals:   05/17/24 1357 05/17/24 1400  BP:  (!) 129/95  Pulse:  (!) 115  Resp:  20  Temp:  98.4  F (36.9 C)  TempSrc:  Oral  SpO2:  96%  Weight: 62.1 kg   Height: 5' 1.5 (1.562 m)     Gen: NAD, chronically ill-appearing HENT: NCAT CV: normal heart sounds Lung: CTAB, diminished in the bases Abd: No TTP, normal bowel sounds MSK: No asymmetry, good bulk and tone Neuro: alert and oriented x 4  Labs on Admission: I have personally reviewed following labs and imaging studies  CBC: Recent Labs  Lab 05/17/24 1401  WBC 27.8*  HGB 11.5*  HCT 34.5*  MCV 94.8  PLT 475*   Basic Metabolic Panel: Recent Labs  Lab 05/17/24 1401  NA 139  K 4.3  CL 99  CO2 25  GLUCOSE 119*  BUN 19  CREATININE 1.59*  CALCIUM  8.4*  MG 1.8   GFR: Estimated Creatinine Clearance: 26.2 mL/min (A) (by C-G formula based on SCr of 1.59 mg/dL (H)). Liver Function Tests: No results for input(s): AST, ALT, ALKPHOS, BILITOT, PROT, ALBUMIN in the last 168 hours. No results for input(s): LIPASE, AMYLASE in the last 168 hours. No results for input(s): AMMONIA in the last  168 hours. Coagulation Profile: No results for input(s): INR, PROTIME in the last 168 hours. Cardiac Enzymes: No results for input(s): CKTOTAL, CKMB, CKMBINDEX, TROPONINI, TROPONINIHS in the last 168 hours. BNP (last 3 results) No results for input(s): BNP in the last 8760 hours. HbA1C: No results for input(s): HGBA1C in the last 72 hours. CBG: No results for input(s): GLUCAP in the last 168 hours. Lipid Profile: No results for input(s): CHOL, HDL, LDLCALC, TRIG, CHOLHDL, LDLDIRECT in the last 72 hours. Thyroid  Function Tests: No results for input(s): TSH, T4TOTAL, FREET4, T3FREE, THYROIDAB in the last 72 hours. Anemia Panel: No results for input(s): VITAMINB12, FOLATE, FERRITIN, TIBC, IRON , RETICCTPCT in the last 72 hours. Urine analysis:    Component Value Date/Time   COLORURINE YELLOW (A) 02/06/2021 1957   APPEARANCEUR HAZY (A) 02/06/2021 1957   LABSPEC 1.016 02/06/2021 1957   PHURINE 5.0 02/06/2021 1957   GLUCOSEU NEGATIVE 02/06/2021 1957   HGBUR NEGATIVE 02/06/2021 1957   BILIRUBINUR NEGATIVE 02/06/2021 1957   KETONESUR NEGATIVE 02/06/2021 1957   PROTEINUR 30 (A) 02/06/2021 1957   NITRITE NEGATIVE 02/06/2021 1957   LEUKOCYTESUR MODERATE (A) 02/06/2021 1957    Radiological Exams on Admission: I have personally reviewed images DG Chest 2 View Result Date: 05/17/2024 EXAM: 2 VIEW(S) XRAY OF THE CHEST 05/17/2024 02:30:00 PM COMPARISON: Chest CT 01/09/2022. CLINICAL HISTORY: Cough and SOB. FINDINGS: LUNGS AND PLEURA: There is a new round 3.5 cm right apical mass. Patchy airspace opacities and masslike opacity are seen in the inferior left upper lobe, also new from prior. No pleural effusion. No pneumothorax. HEART AND MEDIASTINUM: Aortic atherosclerosis. No acute abnormality of the cardiac and mediastinal silhouettes. BONES AND SOFT TISSUES: Degenerative changes in the left shoulder. IMPRESSION: 1. New 3.5 cm right apical  pulmonary mass, suspicious for malignancy, and new patchy left upper lobe airspace opacity with a masslike component. Chest CT is recommended for further evaluation. 2. Small left pleural effusion without pneumothorax. Electronically signed by: Greig Pique MD 05/17/2024 03:24 PM EST RP Workstation: [id number]    EKG: My personal interpretation of EKG shows: Sinus tachycardia without any acute ST changes    Assessment/Plan Principal Problem:   Sepsis due to pneumonia [facility]) Active Problems:   Benign essential hypertension   Chronic diastolic CHF (congestive heart failure), NYHA class 2 (HCC)   Mixed hyperlipidemia   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HCC)  Stage 3b chronic kidney disease (HCC)   Diabetes mellitus treated with oral medication (HCC)   Thyroid  cancer (HCC)   PNA Pt presented cough, exertional dyspnea and noted to have significant leukocytosis and image findings consistent with pneumonia.  Meets sepsis criteria.  Blood cultures ordered. Pt is status post ceftriaxone  azithromycin .  There was concern for new pulmonary nodule concerning for malignancy so CT is pending.  Given patient with tachycardia, malignancy and some pleuritic chest pain, CTA was ordered which will help evaluate her lungs further and rule out pulmonary embolism.  Will add procalcitonin to tomorrow's labs. -Continue Ceftriaxone  and Azithromycin   -Monitor fever curve, trend WBC -Follow up blood cultures  -Wean oxygen as able -Continuous pluse ox  Interval update: Patient appears improved on repeat examination, will change level of care to telemetry.  Hypertension: Holding home medicine given patient meets sepsis criteria.  Will continue to monitor and use IV medicines as needed.  Can restart home medication once clinical picture improves.  CHF: Patient is euvolemic on exam.  Continue to monitor.  She is not on any diuretics in the outpatient setting.  CKD 3B: At baseline.  Continue to monitor.  Dose  medications, avoid nephrotoxic agents.  Type 2 diabetes: Holding home medicines.  Started on SSI.  Cervical cancer/Thyroid  Cancer: Follows with oncology.  They recommended patient's tablets with endocrinology which patient has refused per oncology notes.  No inpatient needs for this problem.  She will need close follow-up with PCP, oncology along with endocrinology.  Discussed the importance of this with the patient and daughter at bedside.  Hep C infection: She is scheduled to see GI in 05/2024 to initiate treatment.  She has history of nonadherence to her medical care.  Discussed importance for close follow-up.  Pulmonary nodule: Will follow-up CT and may need to consult pulmonology based on its findings.   Weakness: PT and OT consulted.  VTE prophylaxis:  SQ Heparin   Diet: HH Code Status:  Full Code Telemetry:  Admission status: Observation, Progressive Patient is from: Home Anticipated d/c is to: Home Anticipated d/c is in: 1-2 days   Family Communication: Updated at bedside  Consults called: None   Severity of Illness: The appropriate patient status for this patient is OBSERVATION. Observation status is judged to be reasonable and necessary in order to provide the required intensity of service to ensure the patient's safety. The patient's presenting symptoms, physical exam findings, and initial radiographic and laboratory data in the context of their medical condition is felt to place them at decreased risk for further clinical deterioration. Furthermore, it is anticipated that the patient will be medically stable for discharge from the hospital within 2 midnights of admission.    Morene Bathe, MD Jolynn DEL. [facility]     [1]  Allergies Allergen Reactions   Oxybutynin  Itching and Other (See Comments)   Pregabalin  Other (See Comments)    Hallucentations   Ace Inhibitors Itching    Rash   Cyclobenzaprine Itching    Rash and 'made me nervous'   "

## 2024-05-17 NOTE — ED Notes (Addendum)
 Hospitalist  Dr Fernand  in with patient to assess new cough, per V.O. LR at 120ml/hr to discontinue

## 2024-05-17 NOTE — ED Triage Notes (Signed)
 Patient c/o cough and sob X1 week

## 2024-05-17 NOTE — ED Notes (Signed)
Patient to CT SCAN

## 2024-05-17 NOTE — ED Notes (Signed)
 Placed back on [facility] for O2 sat 93%, now at 96%

## 2024-05-17 NOTE — ED Provider Notes (Signed)
 "  [facility] Provider Note    Event Date/Time   First MD Initiated Contact with Patient 05/17/24 1419     (approximate)   History   Cough and Shortness of Breath   HPI  Rebecca Parker is a 74 y.o. female with a past medical history of anemia, CKD, hypertension, CHF, presenting to the emergency department complaining of a cough and shortness of breath which has been ongoing for the last month but became worse over the last week.  Patient reports that taking a deep breath, sitting outside, or dry heat makes her coughing worse.  She has tried over-the-counter cough and cold medications with no relief of symptoms.  Reports associated congestion.  Denies any fevers, sweats, chills, chest pain.  She reports that she only has shortness of breath because of the coughing fits.     Physical Exam   Triage Vital Signs: ED Triage Vitals  Encounter Vitals Group     BP 05/17/24 1400 (!) 129/95     Girls Systolic BP Percentile --      Girls Diastolic BP Percentile --      Boys Systolic BP Percentile --      Boys Diastolic BP Percentile --      Pulse Rate 05/17/24 1400 (!) 115     Resp 05/17/24 1400 20     Temp 05/17/24 1400 98.4 F (36.9 C)     Temp Source 05/17/24 1400 Oral     SpO2 05/17/24 1400 96 %     Weight 05/17/24 1357 137 lb (62.1 kg)     Height 05/17/24 1357 5' 1.5 (1.562 m)     Head Circumference --      Peak Flow --      Pain Score 05/17/24 1357 0     Pain Loc --      Pain Education --      Exclude from Growth Chart --     Most recent vital signs: Vitals:   05/17/24 1400  BP: (!) 129/95  Pulse: (!) 115  Resp: 20  Temp: 98.4 F (36.9 C)  SpO2: 96%     General: Awake, no distress.  CV:  Good peripheral perfusion.  Resp:  Normal effort.  Abd:  No distention.  Other:     ED Results / Procedures / Treatments   Labs (all labs ordered are listed, but only abnormal results are displayed) Labs Reviewed  BASIC METABOLIC PANEL WITH GFR   CBC     EKG  ED ECG REPORT I, Reche CHRISTELLA Leventhal, the attending physician, personally viewed and interpreted this ECG.  Date: 05/17/2024 Rate: 112bpm Rhythm: Sinus tachycardia QRS Axis: normal Intervals: normal ST/T Wave abnormalities: normal Narrative Interpretation: no evidence of acute ischemia    RADIOLOGY I, Reche CHRISTELLA Leventhal, personally viewed and evaluated these images (plain radiographs) as part of my medical decision making, as well as reviewing the written report by the radiologist.  Chest x-ray: IMPRESSION: 1. New 3.5 cm right apical pulmonary mass, suspicious for malignancy, and new patchy left upper lobe airspace opacity with a masslike component. Chest CT is recommended for further evaluation. 2. Small left pleural effusion without pneumothorax.    PROCEDURES:  Critical Care performed: No  Procedures   MEDICATIONS ORDERED IN ED: Medications - No data to display   IMPRESSION / MDM / ASSESSMENT AND PLAN / ED COURSE  I reviewed the triage vital signs and the nursing notes.  Differential diagnosis includes, but is not limited to,   Patient's presentation is most consistent with acute presentation with potential threat to life or bodily function.  Patient is a 74 year old female with past medical history of CKD, anemia, hypertension, CHF, presenting to the emergency department complaining of a cough and shortness of breath for the last few weeks.  EKG, chest x-ray, and lab work ordered for further evaluation.  On workup the patient has a significantly elevated white blood cell count.  She is noted to be tachycardic on her EKG.  Chest x-ray shows findings concerning for pneumonia.  Sepsis protocol was initiated on the patient at this time.  Radiology has reviewed the chest x-ray and notes a new mass that is suspicious for a pregnancy.  They do recommend a CT chest at this time which will be ordered.  Patient will be  admitted for further workup and treatment.      FINAL CLINICAL IMPRESSION(S) / ED DIAGNOSES   Final diagnoses:  Community acquired pneumonia, unspecified laterality  Acute cough  Sepsis, due to unspecified organism, unspecified whether acute organ dysfunction present [facility])     Rx / DC Orders   ED Discharge Orders     None        Note:  This document was prepared using Dragon voice recognition software and may include unintentional dictation errors.   [place] Reche HERO, MD 05/17/24 1545  "

## 2024-05-17 NOTE — Sepsis Progress Note (Signed)
Sepsis protocol monitored by elink

## 2024-05-18 DIAGNOSIS — I10 Essential (primary) hypertension: Secondary | ICD-10-CM | POA: Diagnosis not present

## 2024-05-18 DIAGNOSIS — Z79899 Other long term (current) drug therapy: Secondary | ICD-10-CM | POA: Diagnosis not present

## 2024-05-18 DIAGNOSIS — Z79890 Hormone replacement therapy (postmenopausal): Secondary | ICD-10-CM | POA: Diagnosis not present

## 2024-05-18 DIAGNOSIS — B1920 Unspecified viral hepatitis C without hepatic coma: Secondary | ICD-10-CM | POA: Diagnosis present

## 2024-05-18 DIAGNOSIS — J189 Pneumonia, unspecified organism: Secondary | ICD-10-CM | POA: Diagnosis present

## 2024-05-18 DIAGNOSIS — Z7982 Long term (current) use of aspirin: Secondary | ICD-10-CM | POA: Diagnosis not present

## 2024-05-18 DIAGNOSIS — C7801 Secondary malignant neoplasm of right lung: Secondary | ICD-10-CM | POA: Diagnosis present

## 2024-05-18 DIAGNOSIS — D75839 Thrombocytosis, unspecified: Secondary | ICD-10-CM | POA: Diagnosis present

## 2024-05-18 DIAGNOSIS — C73 Malignant neoplasm of thyroid gland: Secondary | ICD-10-CM

## 2024-05-18 DIAGNOSIS — A419 Sepsis, unspecified organism: Secondary | ICD-10-CM | POA: Diagnosis present

## 2024-05-18 DIAGNOSIS — E890 Postprocedural hypothyroidism: Secondary | ICD-10-CM | POA: Diagnosis present

## 2024-05-18 DIAGNOSIS — N179 Acute kidney failure, unspecified: Secondary | ICD-10-CM | POA: Diagnosis not present

## 2024-05-18 DIAGNOSIS — I130 Hypertensive heart and chronic kidney disease with heart failure and stage 1 through stage 4 chronic kidney disease, or unspecified chronic kidney disease: Secondary | ICD-10-CM | POA: Diagnosis present

## 2024-05-18 DIAGNOSIS — C7802 Secondary malignant neoplasm of left lung: Secondary | ICD-10-CM | POA: Diagnosis present

## 2024-05-18 DIAGNOSIS — D631 Anemia in chronic kidney disease: Secondary | ICD-10-CM | POA: Diagnosis present

## 2024-05-18 DIAGNOSIS — Z8541 Personal history of malignant neoplasm of cervix uteri: Secondary | ICD-10-CM | POA: Diagnosis not present

## 2024-05-18 DIAGNOSIS — Z8249 Family history of ischemic heart disease and other diseases of the circulatory system: Secondary | ICD-10-CM | POA: Diagnosis not present

## 2024-05-18 DIAGNOSIS — C539 Malignant neoplasm of cervix uteri, unspecified: Secondary | ICD-10-CM | POA: Diagnosis not present

## 2024-05-18 DIAGNOSIS — Z90710 Acquired absence of both cervix and uterus: Secondary | ICD-10-CM | POA: Diagnosis not present

## 2024-05-18 DIAGNOSIS — E782 Mixed hyperlipidemia: Secondary | ICD-10-CM | POA: Diagnosis present

## 2024-05-18 DIAGNOSIS — N1832 Chronic kidney disease, stage 3b: Secondary | ICD-10-CM | POA: Diagnosis present

## 2024-05-18 DIAGNOSIS — F17210 Nicotine dependence, cigarettes, uncomplicated: Secondary | ICD-10-CM | POA: Diagnosis present

## 2024-05-18 DIAGNOSIS — Z515 Encounter for palliative care: Secondary | ICD-10-CM | POA: Diagnosis not present

## 2024-05-18 DIAGNOSIS — I110 Hypertensive heart disease with heart failure: Secondary | ICD-10-CM | POA: Diagnosis not present

## 2024-05-18 DIAGNOSIS — E1122 Type 2 diabetes mellitus with diabetic chronic kidney disease: Secondary | ICD-10-CM | POA: Diagnosis present

## 2024-05-18 DIAGNOSIS — I5032 Chronic diastolic (congestive) heart failure: Secondary | ICD-10-CM | POA: Diagnosis present

## 2024-05-18 DIAGNOSIS — Z7984 Long term (current) use of oral hypoglycemic drugs: Secondary | ICD-10-CM | POA: Diagnosis not present

## 2024-05-18 DIAGNOSIS — Z1152 Encounter for screening for COVID-19: Secondary | ICD-10-CM | POA: Diagnosis not present

## 2024-05-18 DIAGNOSIS — T17890A Other foreign object in other parts of respiratory tract causing asphyxiation, initial encounter: Secondary | ICD-10-CM | POA: Diagnosis present

## 2024-05-18 DIAGNOSIS — R918 Other nonspecific abnormal finding of lung field: Secondary | ICD-10-CM | POA: Diagnosis not present

## 2024-05-18 DIAGNOSIS — R0602 Shortness of breath: Secondary | ICD-10-CM | POA: Diagnosis present

## 2024-05-18 LAB — CBC
HCT: 28.6 % — ABNORMAL LOW (ref 36.0–46.0)
Hemoglobin: 9.4 g/dL — ABNORMAL LOW (ref 12.0–15.0)
MCH: 30.9 pg (ref 26.0–34.0)
MCHC: 32.9 g/dL (ref 30.0–36.0)
MCV: 94.1 fL (ref 80.0–100.0)
Platelets: 379 K/uL (ref 150–400)
RBC: 3.04 MIL/uL — ABNORMAL LOW (ref 3.87–5.11)
RDW: 14.6 % (ref 11.5–15.5)
WBC: 18.6 K/uL — ABNORMAL HIGH (ref 4.0–10.5)
nRBC: 0 % (ref 0.0–0.2)

## 2024-05-18 LAB — BASIC METABOLIC PANEL WITH GFR
Anion gap: 10 (ref 5–15)
BUN: 17 mg/dL (ref 8–23)
CO2: 25 mmol/L (ref 22–32)
Calcium: 7.8 mg/dL — ABNORMAL LOW (ref 8.9–10.3)
Chloride: 104 mmol/L (ref 98–111)
Creatinine, Ser: 1.34 mg/dL — ABNORMAL HIGH (ref 0.44–1.00)
GFR, Estimated: 41 mL/min — ABNORMAL LOW
Glucose, Bld: 80 mg/dL (ref 70–99)
Potassium: 4.2 mmol/L (ref 3.5–5.1)
Sodium: 139 mmol/L (ref 135–145)

## 2024-05-18 LAB — GLUCOSE, CAPILLARY: Glucose-Capillary: 93 mg/dL (ref 70–99)

## 2024-05-18 MED ORDER — ACETAMINOPHEN-CODEINE 120-12 MG/5ML PO SOLN
10.0000 mL | Freq: Four times a day (QID) | ORAL | Status: DC | PRN
  Administered 2024-05-18: 10 mL via ORAL
  Filled 2024-05-18: qty 10

## 2024-05-18 MED ORDER — FLEET ENEMA RE ENEM
1.0000 | ENEMA | Freq: Once | RECTAL | Status: AC
  Administered 2024-05-18: 1 via RECTAL

## 2024-05-18 MED ORDER — ACETAMINOPHEN-CODEINE 120-12 MG/5ML PO SOLN
10.0000 mL | Freq: Four times a day (QID) | ORAL | Status: DC | PRN
  Filled 2024-05-18: qty 10

## 2024-05-18 NOTE — Discharge Instructions (Signed)

## 2024-05-18 NOTE — TOC CM/SW Note (Signed)
 Transition of Care [facility]) - Inpatient Brief Assessment   Patient Details  Name: CHENELLE BENNING MRN: [id number] Date of Birth: [birth year]  Transition of Care [facility]) CM/SW Contact:    Daved JONETTA Hamilton, RN Phone Number: 05/18/2024, 12:11 PM   Clinical Narrative:   Transition of Care [facility]) Screening Note   Patient Details  Name: ILDA LASKIN Date of Birth: 26-Aug-1950   Transition of Care [facility]) CM/SW Contact:    Daved JONETTA Hamilton, RN Phone Number: 05/18/2024, 12:11 PM  Social resources added to AVS  Transition of Care Department [facility]) has reviewed patient and no TOC needs have been identified at this time. If new patient transition needs arise, please place a TOC consult.    Transition of Care Asessment: Insurance and Status: Insurance coverage has been reviewed Patient has primary care physician: Yes   Prior level of function:: Independent Prior/Current Home Services: No current home services Social Drivers of Health Review: SDOH reviewed interventions complete (social resources added to AVS) Readmission risk has been reviewed: No (Patient is Observation, no score generated) Transition of care needs: no transition of care needs at this time

## 2024-05-18 NOTE — Consult Note (Signed)
 "    PULMONOLOGY         Date: 05/18/2024,   MRN# [id number] Rebecca Parker 06/29/1950     AdmissionWeight: 62.1 kg                 CurrentWeight: 57.4 kg  Referring provider: Dr Maree    CHIEF COMPLAINT:   Chronic cough with lung mass   HISTORY OF PRESENT ILLNESS   Ms. pleasant 74 year old female with a history of chronic anemia, B12 deficiency, cervical cancer, chest pain chronically, diastolic CHF, history of cocaine abuse, diabetes, thyroid  cancer, hepatitis, edentulous status, dyslipidemia, hypothyroidism, who came in for worsening dyspnea over the past 4 days prior to admission.  She denies having any sick contacts on arrival to the ED she had leukocytosis CBC with mild thrombocytopenia and renal dysfunction on BMP.  Her viral testing was negative.  She had CXR and CT chest.  I reviewed her CT chest independently, she appears to have consolidation/mass of the left lung with hilar and mediastinal adenopathy, as well as a well-circumscribed right upper lobe mass.   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, cN1, cM0); Tx'd with Cisplatin  + EBRT   Chest pain, unspecified    CHF (congestive heart failure) (HCC)    Chronic diastolic heart failure, NYHA class 2 (HCC)    Cocaine use    none since Cancer dx (11/2021)   Diabetes mellitus without complication (HCC)    Edentulous    lost dentures   Grade I diastolic dysfunction    Hepatitis    Hip pain    HLD (hyperlipidemia)    Hyperplastic colon polyp    Hypertension    Hypothyroidism    Multinodular goiter    Port-A-Cath in place    Thyroid  cancer (HCC)    Thyroid  mass    a.) 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)   Tubular adenoma of colon      SURGICAL HISTORY   Past Surgical History:  Procedure Laterality Date   ABDOMINAL  HYSTERECTOMY     CATARACT EXTRACTION W/PHACO Left 08/14/2023   Procedure: PHACOEMULSIFICATION, CATARACT, WITH IOL INSERTION  3.93  00:31.6;  Surgeon: Enola Feliciano Hugger, MD;  Location: [facility];  Service: Ophthalmology;  Laterality: Left;   CATARACT EXTRACTION W/PHACO Right 08/28/2023   Procedure: PHACOEMULSIFICATION, CATARACT, WITH IOL INSERTION 2.67 00:21.9;  Surgeon: Enola Feliciano Hugger, MD;  Location: [facility];  Service: Ophthalmology;  Laterality: Right;   COLONOSCOPY WITH PROPOFOL  N/A 11/24/2015   Procedure: COLONOSCOPY WITH PROPOFOL ;  Surgeon: Gladis RAYMOND Mariner, MD;  Location: [facility];  Service: Endoscopy;  Laterality: N/A;   COLONOSCOPY WITH PROPOFOL  N/A 03/18/2019   Procedure: COLONOSCOPY WITH PROPOFOL ;  Surgeon: Janalyn Keene NOVAK, MD;  Location: [facility] END[facility];  Service: Endoscopy;  Laterality: N/A;   DILATION AND CURETTAGE OF UTERUS N/A 12/01/2020   Procedure: DILATATION AND CURETTAGE with cervical biopsies;  Surgeon: Schermerhorn, Debby PARAS, MD;  Location: [facility];  Service: Gynecology;  Laterality: N/A;   ESOPHAGOGASTRODUODENOSCOPY (EGD) WITH PROPOFOL  N/A 03/18/2019   Procedure: ESOPHAGOGASTRODUODENOSCOPY (EGD) WITH PROPOFOL ;  Surgeon: Janalyn Keene NOVAK, MD;  Location: [facility];  Service: Endoscopy;  Laterality: N/A;   PORTA CATH INSERTION N/A 01/16/2021   Procedure: PORTA CATH INSERTION;  Surgeon: Jama Cordella MATSU, MD;  Location: [facility];  Service: Cardiovascular;  Laterality: N/A;   PORTA CATH REMOVAL N/A 10/28/2023   Procedure: PORTA CATH REMOVAL;  Surgeon: Jama Cordella MATSU, MD;  Location: [facility];  Service: Cardiovascular;  Laterality: N/A;   right ankle orif     THYROIDECTOMY, COMPLETION Bilateral 12/18/2021     FAMILY HISTORY   Family History  Problem Relation Age of Onset   Hypertension Mother    Breast cancer Maternal Aunt    Breast cancer Paternal Aunt    Hypertension Maternal Grandfather       SOCIAL HISTORY   Social History[1]   MEDICATIONS    Home Medication:    Current Medication: Current Medications[2]    ALLERGIES   Oxybutynin , Pregabalin , Ace inhibitors, and Cyclobenzaprine     REVIEW OF SYSTEMS    Review of Systems:  Gen:  Denies  fever, sweats, chills weigh loss  HEENT: Denies blurred vision, double vision, ear pain, eye pain, hearing loss, nose bleeds, sore throat Cardiac:  No dizziness, chest pain or heaviness, chest tightness,edema Resp:   reports dyspnea chronically  Gi: Denies swallowing difficulty, stomach pain, nausea or vomiting, diarrhea, constipation, bowel incontinence Gu:  Denies bladder incontinence, burning urine Ext:   Denies Joint pain, stiffness or swelling Skin: Denies  skin rash, easy bruising or bleeding or hives Endoc:  Denies polyuria, polydipsia , polyphagia or weight change Psych:   Denies depression, insomnia or hallucinations   Other:  All other systems negative   VS: BP 130/82 (BP Location: Right Arm)   Pulse 81   Temp 98.1 F (36.7 C)   Resp 18   Ht 5' 1.5 (1.562 m)   Wt 57.4 kg   SpO2 100%   BMI 23.52 kg/m      PHYSICAL EXAM    GENERAL:NAD, no fevers, chills, no weakness no fatigue HEAD: Normocephalic, atraumatic.  EYES: Pupils equal, round, reactive to light. Extraocular muscles intact. No scleral icterus.  MOUTH: Moist mucosal membrane. Dentition intact. No abscess noted.  EAR, NOSE, THROAT: Clear without exudates. No external lesions.  NECK: Supple. No thyromegaly. No nodules. No JVD.  PULMONARY: decreased breath sounds with mild rhonchi worse at bases bilaterally.  CARDIOVASCULAR: S1 and S2. Regular rate and rhythm. No murmurs, rubs, or gallops. No edema. Pedal pulses 2+ bilaterally.  GASTROINTESTINAL: Soft, nontender, nondistended. No masses. Positive bowel sounds. No hepatosplenomegaly.  MUSCULOSKELETAL: No swelling, clubbing, or edema. Range of motion full in all extremities.   NEUROLOGIC: Cranial nerves II through XII are intact. No gross focal neurological deficits. Sensation intact. Reflexes intact.  SKIN: No ulceration, lesions, rashes, or cyanosis. Skin warm and dry. Turgor intact.  PSYCHIATRIC: Mood, affect within normal limits. The patient is awake, alert and oriented x 3. Insight, judgment intact.       IMAGING   Narrative & Impression  EXAM: CTA of the Chest with contrast for PE 05/17/2024 10:32:40 PM   TECHNIQUE: CTA of the chest was performed without and with the administration of 75 mL of intravenous contrast (iohexol  (OMNIPAQUE ) 350 MG/ML injection 60 mL IOHEXOL  350 MG/ML SOLN). Multiplanar reformatted images are provided for review. MIP images are provided for review. Automated exposure control, iterative reconstruction, and/or weight based adjustment of the mA/kV was utilized to reduce the radiation dose to as low as reasonably achievable.   COMPARISON: CTA chest dated 01/09/2022.   CLINICAL HISTORY: Pulmonary embolism (PE) suspected, high prob. Cough, SOB, abnormal CXR   FINDINGS:   PULMONARY ARTERIES: Pulmonary arteries  are adequately opacified for evaluation. No pulmonary embolism. Main pulmonary artery is normal in caliber.   MEDIASTINUM: The heart and pericardium demonstrate no acute abnormality. Mild thoracic aortic atherosclerosis.   LYMPH NODES: Thoracic nodal metastases, including a 2.9 cm subcarinal node and 2.4 cm left perihilar node.   LUNGS AND PLEURA: Masslike narrowing of the left mainstem bronchus (image 52). Associated peribronchovascular patchy opacity in the central left lower lobe, suggesting tumor, with post obstructive patchy opacity versus lymphangitic spread of tumor in the lingula and left lower lobe. Dominant 4.3 cm lobulated anterior left lower lobe mass (image 73). Additional dominant 3.8 cm right apical mass (image 18). Additional 7 mm satellite nodule/metastasis in the posterior left upper lobe  (image 33). Small left pleural effusion. No pneumothorax.   UPPER ABDOMEN: Limited images of the upper abdomen are unremarkable.   SOFT TISSUES AND BONES: No acute bone or soft tissue abnormality.   IMPRESSION: 1. No pulmonary embolism. 2. Masslike narrowing of the left mainstem bronchus. Two dominant masses and right lung apex and left upper lobe. 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 3. Left lower lobe/lingular postobstructive opacity vs lymphangitic spread of tumor. 4. Subcarinal and left perihilar nodal metastases. 5. Small left pleural effusion.   Electronically signed by: Pinkie Pebbles MD 05/17/2024 10:43 PM EST RP Workstation: [id number]      ASSESSMENT/PLAN   Bilateral lung mass with hilar adenopathy  Highly concerning for metastatic cancer  - will need to discuss goals of care and consider tissue sampling if patient wishes to have full scope of care Masslike narrowing of the left mainstem bronchus (image 52). Associated peribronchovascular patchy opacity in the central left lower lobe, suggesting tumor, with post obstructive patchy opacity versus lymphangitic spread of tumor in the lingula and left lower lobe.  -will discuss bronchoscopy with patient        Thank you for allowing me to participate in the care of this patient.   Patient/Family are satisfied with care plan and all questions have been answered.    Provider disclosure: Patient with at least one acute or chronic illness or injury that poses a threat to life or bodily function and is being managed actively during this encounter.  All of the below services have been performed independently by signing provider:  review of prior documentation from internal and or external health records.  Review of previous and current lab results.  Interview and comprehensive assessment during patient visit today. Review of current and previous chest radiographs/CT scans.  Discussion of management and test interpretation with health care team and patient/family.   This document was prepared using Dragon voice recognition software and may include unintentional dictation errors.     Nariya Neumeyer, M.D.  Division of Pulmonary & Critical Care Medicine             [1]  Social History Tobacco Use   Smoking status: Former    Current packs/day: 0.10    Average packs/day: 0.1 packs/day for 60.1 years (6.0 ttl pk-yrs)    Types: Cigarettes    Start date: 04/29/1964    Passive exposure: Never   Smokeless tobacco: Never  Vaping Use   Vaping status: Never Used  Substance Use Topics   Alcohol use: Not Currently   Drug use: Yes    Types: Crack cocaine    Comment: Patient is trying to quit  [2]  Current Facility-Administered Medications:    acetaminophen  (TYLENOL ) tablet 650 mg, 650 mg, Oral, Q6H PRN,  650 mg at 05/17/24 2341 **OR** acetaminophen  (TYLENOL ) suppository 650 mg, 650 mg, Rectal, Q6H PRN, Fernand Prost, MD   azithromycin  (ZITHROMAX ) tablet 500 mg, 500 mg, Oral, Daily, Fernand Prost, MD   cefTRIAXone  (ROCEPHIN ) 2 g in sodium chloride  0.9 % 100 mL IVPB, 2 g, Intravenous, Q24H, Fernand Prost, MD   heparin  injection 5,000 Units, 5,000 Units, Subcutaneous, Q8H, Fernand Prost, MD, 5,000 Units at 05/17/24 2154   ondansetron  (ZOFRAN ) tablet 4 mg, 4 mg, Oral, Q6H PRN **OR** ondansetron  (ZOFRAN ) injection 4 mg, 4 mg, Intravenous, Q6H PRN, Fernand Prost, MD   senna-docusate (Senokot-S) tablet 1 tablet, 1 tablet, Oral, QHS PRN, Fernand Prost, MD   sodium chloride  flush (NS) 0.9 % injection 3 mL, 3 mL, Intravenous, Q12H, Fernand Prost, MD, 3 mL at 05/18/24 9188   sodium phosphate  (FLEET) enema 1 enema, 1 enema, Rectal, Once, Maree Hue, MD  "

## 2024-05-18 NOTE — Progress Notes (Signed)
 " PROGRESS NOTE    Rebecca Parker  FMW:[id number] DOB: 1950-08-24 DOA: 05/17/2024 PCP: Melvin Pao, NP   Brief Narrative:  74 y.o. year old female with medical history of hypertension, hyperlipidemia, type 2 diabetes, history of cervical cancer and thyroid  cancer presenting to the ED with cough and shortness of breath.  Patient reports this has been going on for over a month but during the last 4 to 5 days it has become worse   1/20: Pulmo & Onco c/s for lung mass, Palliative care c/s   Assessment & Plan:   Principal Problem:   Sepsis due to pneumonia [facility]) Active Problems:   Benign essential hypertension   Chronic diastolic CHF (congestive heart failure), NYHA class 2 (HCC)   Mixed hyperlipidemia   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HCC)   Stage 3b chronic kidney disease (HCC)   Diabetes mellitus treated with oral medication (HCC)   Thyroid  cancer (HCC)   Lung Mass CT concerning for Lung malignancy - Masslike narrowing of the left mainstem bronchus - Associated peribronchovascular patchy opacity in the central left lower lobe, suggesting tumor, with post obstructive patchy opacity versus lymphangitic spread of tumor in the lingula and left lower lobe.  -c/s Pumo & Onco - may need bronch   Pneumonia -Continue Ceftriaxone  and Azithromycin     Hypertension: controlled for now   CHF: Patient is euvolemic on exam.     CKD 3B: At baseline.    Type 2 diabetes: Holding home medicines. SSI for now   Cervical cancer/Thyroid  Cancer: Follows with oncology at [facility]. She will need close follow-up with PCP, oncology along with endocrinology.  Discussed the importance of this with the patient and daughter at bedside.   Hep C infection: She is scheduled to see GI in 05/2024 to initiate treatment.  She has history of nonadherence to her medical care.  Discussed importance for close follow-up.    Weakness: PT and OT consulted.   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  Objective: Vitals:   05/18/24 0500 05/18/24 0506 05/18/24 0803 05/18/24 1519  BP:  115/66 130/82 (!) 153/90  Pulse:  86 81 89  Resp:  18 18 18   Temp:  (!) 97.4 F (36.3 C) 98.1 F (36.7 C) 100 F (37.8 C)  TempSrc:    Oral  SpO2:  97% 100% 99%  Weight: 57.4 kg     Height:        Intake/Output Summary (Last 24 hours) at 05/18/2024 1944 Last data filed at 05/17/2024 2345 Gross per 24 hour  Intake 178.75 ml  Output --  Net 178.75 ml   Filed Weights   05/17/24 1357 05/18/24 0500  Weight: 62.1 kg 57.4 kg    Examination:  General exam: Appears calm and comfortable  Respiratory system: Clear to auscultation. Respiratory effort normal. Cardiovascular system: S1 & S2 heard, RRR. No murmurs, . No pedal edema. Gastrointestinal system: Abdomen is soft, benign Central nervous system: Alert and oriented. No focal neurological deficits. Extremities: Symmetric 5 x 5 power. Skin: No rashes, lesions or ulcers Psychiatry: Judgement  and insight appear normal. Mood & affect appropriate.     Data Reviewed: I have personally reviewed following labs and imaging studies  CBC: Recent Labs  Lab 05/17/24 1401 05/18/24 0725  WBC 27.8* 18.6*  HGB 11.5* 9.4*  HCT 34.5* 28.6*  MCV 94.8 94.1  PLT 475* 379   Basic Metabolic Panel: Recent Labs  Lab 05/17/24 1401 05/18/24 0725  NA 139 139  K 4.3 4.2  CL 99 104  CO2 25 25  GLUCOSE 119* 80  BUN 19 17  CREATININE 1.59* 1.34*  CALCIUM  8.4* 7.8*  MG 1.8  --    GFR: Estimated Creatinine Clearance: 28.1 mL/min (A) (by  C-G formula based on SCr of 1.34 mg/dL (H)). Liver Function Tests: No results for input(s): AST, ALT, ALKPHOS, BILITOT, PROT, ALBUMIN in the last 168 hours. No results for input(s): LIPASE, AMYLASE in the last 168 hours. No results for input(s): AMMONIA in the last 168 hours. Coagulation Profile: No results for input(s): INR, PROTIME in the last 168 hours. Cardiac Enzymes: No results for input(s): CKTOTAL, CKMB, CKMBINDEX, TROPONINI in the last 168 hours. BNP (last 3 results) No results for input(s): PROBNP in the last 8760 hours. HbA1C: No results for input(s): HGBA1C in the last 72 hours. CBG: Recent Labs  Lab 05/18/24 0809  GLUCAP 93   Lipid Profile: No results for input(s): CHOL, HDL, LDLCALC, TRIG, CHOLHDL, LDLDIRECT in the last 72 hours. Thyroid  Function Tests: No results for input(s): TSH, T4TOTAL, FREET4, T3FREE, THYROIDAB in the last 72 hours. Anemia Panel: No results for input(s): VITAMINB12, FOLATE, FERRITIN, TIBC, IRON , RETICCTPCT in the last 72 hours. Sepsis Labs: Recent Labs  Lab 05/17/24 1655  LATICACIDVEN 1.7    Recent Results (from the past 240 hours)  Resp panel by RT-PCR (RSV, Flu A&B, Covid) Anterior Nasal Swab     Status: None   Collection Time: 05/17/24  3:34 PM   Specimen: Anterior Nasal Swab  Result Value Ref Range Status   SARS Coronavirus 2 by RT PCR NEGATIVE NEGATIVE Final    Comment: (NOTE) SARS-CoV-2 target nucleic acids are NOT DETECTED.  The SARS-CoV-2 RNA is generally detectable in upper respiratory specimens during the acute phase of infection. The lowest concentration of SARS-CoV-2 viral copies this assay can detect is 138 copies/mL. A negative result does not preclude SARS-Cov-2 infection and should not be used as the sole basis for treatment or other patient management decisions. A negative result may occur with  improper specimen collection/handling, submission of  specimen other than nasopharyngeal swab, presence of viral mutation(s) within the areas targeted by this assay, and inadequate number of viral copies(<138 copies/mL). A negative result must be combined with clinical observations, patient history, and epidemiological information. The expected result is Negative.  Fact Sheet for Patients:  bloggercourse.com  Fact Sheet for Healthcare Providers:  seriousbroker.it  This test is no t yet approved or cleared by the [place]  FDA and  has been authorized for detection and/or diagnosis of SARS-CoV-2 by FDA under an Emergency Use Authorization (EUA). This EUA will remain  in effect (meaning this test can be used) for the duration of the COVID-19 declaration under Section 564(b)(1) of the Act, 21 U.S.C.section 360bbb-3(b)(1), unless the authorization is terminated  or revoked sooner.       Influenza A by PCR NEGATIVE NEGATIVE Final   Influenza B by PCR NEGATIVE NEGATIVE Final    Comment: (NOTE) The Xpert Xpress SARS-CoV-2/FLU/RSV plus assay is intended as an aid in the diagnosis of  influenza from Nasopharyngeal swab specimens and should not be used as a sole basis for treatment. Nasal washings and aspirates are unacceptable for Xpert Xpress SARS-CoV-2/FLU/RSV testing.  Fact Sheet for Patients: bloggercourse.com  Fact Sheet for Healthcare Providers: seriousbroker.it  This test is not yet approved or cleared by the [place]  FDA and has been authorized for detection and/or diagnosis of SARS-CoV-2 by FDA under an Emergency Use Authorization (EUA). This EUA will remain in effect (meaning this test can be used) for the duration of the COVID-19 declaration under Section 564(b)(1) of the Act, 21 U.S.C. section 360bbb-3(b)(1), unless the authorization is terminated or revoked.     Resp Syncytial Virus by PCR NEGATIVE NEGATIVE Final     Comment: (NOTE) Fact Sheet for Patients: bloggercourse.com  Fact Sheet for Healthcare Providers: seriousbroker.it  This test is not yet approved or cleared by the [place]  FDA and has been authorized for detection and/or diagnosis of SARS-CoV-2 by FDA under an Emergency Use Authorization (EUA). This EUA will remain in effect (meaning this test can be used) for the duration of the COVID-19 declaration under Section 564(b)(1) of the Act, 21 U.S.C. section 360bbb-3(b)(1), unless the authorization is terminated or revoked.  Performed at [facility], [address] [place], [place] [address]   Blood culture (routine x 2)     Status: None (Preliminary result)   Collection Time: 05/17/24  4:55 PM   Specimen: BLOOD  Result Value Ref Range Status   Specimen Description BLOOD BLOOD LEFT ARM  Final   Special Requests   Final    BOTTLES DRAWN AEROBIC AND ANAEROBIC Blood Culture results may not be optimal due to an inadequate volume of blood received in culture bottles   Culture   Final    NO GROWTH < 24 HOURS Performed at [facility], [address], [place], [place] [address]    Report Status PENDING  Incomplete  Blood culture (routine x 2)     Status: None (Preliminary result)   Collection Time: 05/17/24  5:23 PM   Specimen: BLOOD  Result Value Ref Range Status   Specimen Description BLOOD BLOOD RIGHT ARM  Final   Special Requests   Final    BOTTLES DRAWN AEROBIC AND ANAEROBIC Blood Culture results may not be optimal due to an inadequate volume of blood received in culture bottles   Culture   Final    NO GROWTH < 24 HOURS Performed at [facility], [address], [place], [place] [address]    Report Status PENDING  Incomplete         Radiology Studies: CT Angio Chest Pulmonary Embolism (PE) W or WO Contrast Result Date: 05/17/2024 EXAM: CTA of the Chest with contrast for PE 05/17/2024  10:32:40 PM TECHNIQUE: CTA of the chest was performed without and with the administration of 75 mL of intravenous contrast (iohexol  (OMNIPAQUE ) 350 MG/ML injection 60 mL IOHEXOL  350 MG/ML SOLN). Multiplanar reformatted images are provided for review. MIP images are provided for review. Automated exposure control, iterative reconstruction, and/or weight based adjustment of the mA/kV was utilized to reduce the radiation dose to as low as reasonably achievable. COMPARISON: CTA chest dated 01/09/2022. CLINICAL HISTORY: Pulmonary embolism (PE) suspected, high prob. Cough, SOB, abnormal CXR FINDINGS: PULMONARY ARTERIES: Pulmonary arteries are adequately opacified for evaluation. No pulmonary embolism. Main pulmonary artery is normal in caliber. MEDIASTINUM: The heart and pericardium demonstrate no acute abnormality. Mild thoracic aortic atherosclerosis. LYMPH NODES: Thoracic nodal metastases, including a 2.9 cm  subcarinal node and 2.4 cm left perihilar node. LUNGS AND PLEURA: Masslike narrowing of the left mainstem bronchus (image 52). Associated peribronchovascular patchy opacity in the central left lower lobe, suggesting tumor, with post obstructive patchy opacity versus lymphangitic spread of tumor in the lingula and left lower lobe. Dominant 4.3 cm lobulated anterior left lower lobe mass (image 73). Additional dominant 3.8 cm right apical mass (image 18). Additional 7 mm satellite nodule/metastasis in the posterior left upper lobe (image 33). Small left pleural effusion. No pneumothorax. UPPER ABDOMEN: Limited images of the upper abdomen are unremarkable. SOFT TISSUES AND BONES: No acute bone or soft tissue abnormality. IMPRESSION: 1. No pulmonary embolism. 2. Masslike narrowing of the left mainstem bronchus. Two dominant masses and right lung apex and left upper lobe. 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 3. Left lower lobe/lingular postobstructive opacity vs lymphangitic spread  of tumor. 4. Subcarinal and left perihilar nodal metastases. 5. Small left pleural effusion. Electronically signed by: Pinkie Pebbles MD 05/17/2024 10:43 PM EST RP Workstation: [id number]   DG Chest 2 View Result Date: 05/17/2024 EXAM: 2 VIEW(S) XRAY OF THE CHEST 05/17/2024 02:30:00 PM COMPARISON: Chest CT 01/09/2022. CLINICAL HISTORY: Cough and SOB. FINDINGS: LUNGS AND PLEURA: There is a new round 3.5 cm right apical mass. Patchy airspace opacities and masslike opacity are seen in the inferior left upper lobe, also new from prior. No pleural effusion. No pneumothorax. HEART AND MEDIASTINUM: Aortic atherosclerosis. No acute abnormality of the cardiac and mediastinal silhouettes. BONES AND SOFT TISSUES: Degenerative changes in the left shoulder. IMPRESSION: 1. New 3.5 cm right apical pulmonary mass, suspicious for malignancy, and new patchy left upper lobe airspace opacity with a masslike component. Chest CT is recommended for further evaluation. 2. Small left pleural effusion without pneumothorax. Electronically signed by: Greig Pique MD 05/17/2024 03:24 PM EST RP Workstation: [id number]        Scheduled Meds:  azithromycin   500 mg Oral Daily   heparin   5,000 Units Subcutaneous Q8H   sodium chloride  flush  3 mL Intravenous Q12H   Continuous Infusions:  cefTRIAXone  (ROCEPHIN )  IV 2 g (05/18/24 1608)     LOS: 0 days    Time spent: 35 mins    Zinedine Ellner Maree, MD [facility] Pager [phone]  If 7PM-7AM, please contact night-coverage [email or web address]  05/18/2024, 7:44 PM   "

## 2024-05-18 NOTE — Plan of Care (Signed)

## 2024-05-18 NOTE — Plan of Care (Signed)
" °  Problem: Education: Goal: Knowledge of General Education information will improve Description: Including pain rating scale, medication(s)/side effects and non-pharmacologic comfort measures Outcome: Progressing   Problem: Health Behavior/Discharge Planning: Goal: Ability to manage health-related needs will improve Outcome: Progressing   Problem: Clinical Measurements: Goal: Ability to maintain clinical measurements within normal limits will improve Outcome: Progressing Goal: Will remain Rebecca Parker from infection Outcome: Progressing Goal: Diagnostic test results will improve Outcome: Progressing Goal: Respiratory complications will improve Outcome: Progressing Goal: Cardiovascular complication will be avoided Outcome: Progressing   Problem: Activity: Goal: Risk for activity intolerance will decrease Outcome: Progressing   Problem: Nutrition: Goal: Adequate nutrition will be maintained Outcome: Progressing   Problem: Elimination: Goal: Will not experience complications related to urinary retention Outcome: Progressing   Problem: Pain Managment: Goal: General experience of comfort will improve and/or be controlled Outcome: Progressing   Problem: Safety: Goal: Ability to remain Rebecca Parker from injury will improve Outcome: Progressing   Problem: Skin Integrity: Goal: Risk for impaired skin integrity will decrease Outcome: Progressing   Problem: Coping: Goal: Level of anxiety will decrease Outcome: Not Progressing Note: Pt tearful and anxious about her diagnosis. RN provided emotional support.   Problem: Elimination: Goal: Will not experience complications related to bowel motility Outcome: Not Progressing Note: Pt received enema. No BM yet. Will continue to monitor.   "

## 2024-05-19 DIAGNOSIS — I110 Hypertensive heart disease with heart failure: Secondary | ICD-10-CM | POA: Diagnosis not present

## 2024-05-19 DIAGNOSIS — I5032 Chronic diastolic (congestive) heart failure: Secondary | ICD-10-CM | POA: Diagnosis not present

## 2024-05-19 DIAGNOSIS — J189 Pneumonia, unspecified organism: Secondary | ICD-10-CM

## 2024-05-19 DIAGNOSIS — C539 Malignant neoplasm of cervix uteri, unspecified: Secondary | ICD-10-CM

## 2024-05-19 DIAGNOSIS — R918 Other nonspecific abnormal finding of lung field: Secondary | ICD-10-CM

## 2024-05-19 DIAGNOSIS — Z515 Encounter for palliative care: Secondary | ICD-10-CM | POA: Diagnosis not present

## 2024-05-19 DIAGNOSIS — A419 Sepsis, unspecified organism: Secondary | ICD-10-CM | POA: Diagnosis not present

## 2024-05-19 LAB — BASIC METABOLIC PANEL WITH GFR
Anion gap: 9 (ref 5–15)
BUN: 13 mg/dL (ref 8–23)
CO2: 27 mmol/L (ref 22–32)
Calcium: 7.7 mg/dL — ABNORMAL LOW (ref 8.9–10.3)
Chloride: 104 mmol/L (ref 98–111)
Creatinine, Ser: 1.28 mg/dL — ABNORMAL HIGH (ref 0.44–1.00)
GFR, Estimated: 43 mL/min — ABNORMAL LOW
Glucose, Bld: 98 mg/dL (ref 70–99)
Potassium: 3.5 mmol/L (ref 3.5–5.1)
Sodium: 139 mmol/L (ref 135–145)

## 2024-05-19 LAB — CBC
HCT: 27 % — ABNORMAL LOW (ref 36.0–46.0)
Hemoglobin: 9.1 g/dL — ABNORMAL LOW (ref 12.0–15.0)
MCH: 31.3 pg (ref 26.0–34.0)
MCHC: 33.7 g/dL (ref 30.0–36.0)
MCV: 92.8 fL (ref 80.0–100.0)
Platelets: 357 K/uL (ref 150–400)
RBC: 2.91 MIL/uL — ABNORMAL LOW (ref 3.87–5.11)
RDW: 14.1 % (ref 11.5–15.5)
WBC: 11.6 K/uL — ABNORMAL HIGH (ref 4.0–10.5)
nRBC: 0 % (ref 0.0–0.2)

## 2024-05-19 LAB — GLUCOSE, CAPILLARY: Glucose-Capillary: 82 mg/dL (ref 70–99)

## 2024-05-19 MED ORDER — LEVOTHYROXINE SODIUM 112 MCG PO TABS
112.0000 ug | ORAL_TABLET | Freq: Every day | ORAL | Status: DC
  Administered 2024-05-20 – 2024-05-21 (×2): 112 ug via ORAL
  Filled 2024-05-19 (×2): qty 1

## 2024-05-19 MED ORDER — BISACODYL 10 MG RE SUPP
10.0000 mg | Freq: Every day | RECTAL | Status: DC | PRN
  Administered 2024-05-19: 10 mg via RECTAL
  Filled 2024-05-19: qty 1

## 2024-05-19 MED ORDER — GUAIFENESIN-DM 100-10 MG/5ML PO SYRP
5.0000 mL | ORAL_SOLUTION | ORAL | Status: DC | PRN
  Administered 2024-05-19 – 2024-05-20 (×6): 5 mL via ORAL
  Filled 2024-05-19 (×6): qty 10

## 2024-05-19 MED ORDER — MIRTAZAPINE 15 MG PO TABS
15.0000 mg | ORAL_TABLET | Freq: Every day | ORAL | Status: DC
  Administered 2024-05-19 – 2024-05-20 (×2): 15 mg via ORAL
  Filled 2024-05-19 (×2): qty 1

## 2024-05-19 MED ORDER — LOSARTAN POTASSIUM 25 MG PO TABS
25.0000 mg | ORAL_TABLET | Freq: Two times a day (BID) | ORAL | Status: DC
  Administered 2024-05-19 – 2024-05-20 (×3): 25 mg via ORAL
  Filled 2024-05-19 (×3): qty 1

## 2024-05-19 MED ORDER — SIMVASTATIN 20 MG PO TABS
20.0000 mg | ORAL_TABLET | Freq: Every day | ORAL | Status: DC
  Administered 2024-05-19 – 2024-05-20 (×2): 20 mg via ORAL
  Filled 2024-05-19 (×2): qty 1

## 2024-05-19 MED ORDER — FLEET ENEMA RE ENEM
1.0000 | ENEMA | Freq: Every day | RECTAL | Status: DC | PRN
  Administered 2024-05-19: 1 via RECTAL

## 2024-05-19 MED ORDER — POLYETHYLENE GLYCOL 3350 17 G PO PACK
17.0000 g | PACK | Freq: Every day | ORAL | Status: DC
  Administered 2024-05-19 – 2024-05-20 (×2): 17 g via ORAL
  Filled 2024-05-19 (×2): qty 1

## 2024-05-19 NOTE — Progress Notes (Signed)
 OT Cancellation Note  Patient Details Name: Rebecca Parker MRN: [id number] DOB: 1950/08/05   Cancelled Treatment:    Reason Eval/Treat Not Completed: OT screened, no needs identified, will sign off. Chart reviewed, per conversation with PT pt moving near baseline IND, endorses mild endurance deficits. Attempted education on OT role and importance of mobility, pt becomes tearful and states I think I'm getting ready to leave this world the way everybody is looking at me. Offered chaplain visit and pt agreeable, RN notified. Will sign off, please re-consult if new needs arise.   Elston Slot, M.S. OTR/L  05/19/24, 3:06 PM  ascom [phone]

## 2024-05-19 NOTE — Plan of Care (Signed)
   Problem: Education: Goal: Knowledge of General Education information will improve Description Including pain rating scale, medication(s)/side effects and non-pharmacologic comfort measures Outcome: Progressing   Problem: Health Behavior/Discharge Planning: Goal: Ability to manage health-related needs will improve Outcome: Progressing

## 2024-05-19 NOTE — Progress Notes (Addendum)
 " PROGRESS NOTE    Rebecca Parker   FMW:[id number] DOB: 08-31-1950  DOA: 05/17/2024 Date of Service: 05/19/24 which is hospital day 1  PCP: Melvin Pao, NP    Hospital course / significant events:   HPI: Rebecca Parker is a 74 y.o. year old female with medical history of hypertension, hyperlipidemia, type 2 diabetes, history of cervical cancer and thyroid  cancer presenting to the ED with cough and shortness of breath.  Patient reports this has been going on for over a month but during the last 4 to 5 days it has become worse.  Denies any fevers or chills.  Per daughter at bedside she states patient has been weak and just feeling unwell.  She reports decreased appetite.  When discussed missing some of her appointments and not scheduling some of the recommended appointments, daughter stated she did not know about this.  Given this was worsening, she presented to the ED for further evaluation.   01/19: On arrival to the ED, VSS. CBC with significant leukocytosis at 27.8k, anemia at baseline.  There is mild thrombocytosis.  BMP with baseline renal function.  Magnesium  within normal limits.  Negative for COVID, flu, RSV.  Imaging showed new pulmonary mass that is 3.5 cm concerning for malignancy along with left upper lobe airspace opacity.  Given coughing, leukocytosis with opacity on imaging concerning for pneumonia,[facility] contacted for admission.  01/20: Pulmo & Onco c/s for lung mass, may need bronchoscopy if full scope tx is the goal. Palliative care c/s  01/21: per pulmonary, plan for bronchoscopy this Friday 01/23     Consultants:  Pulmonology Oncology  Palliative Care   Procedures/Surgeries: none      ASSESSMENT & PLAN:   CT chest concerning for Lung malignancy - Masslike narrowing of the left mainstem bronchus - Associated peribronchovascular patchy opacity in the central left lower lobe, suggesting tumor, with post obstructive patchy opacity versus lymphangitic spread of tumor  in the lingula and left lower lobe.  Bronchoscopy planned for this Friday 01/23 - holding aspirin for now    Pneumonia Continue Ceftriaxone  and Azithromycin     Hypertension controlled for now Resume home losartan  at lower dose (25 mg bid instead of 50 mg bid)    Chronic HFpEF not in exacerbation Patient is euvolemic on exam.  Diuresis as needed    AKI on CKD 3a: improved and now at baseline.  Monitor periodic BMP   Type 2 diabetes:  Holding home medicines.  SSI for now   Hx Cervical cancer/Thyroid  Cancer:  Follows with oncology at [facility].  She will need close follow-up with PCP, oncology along with endocrinology.    Hep C infection:  scheduled to see GI in 05/2024 to initiate treatment.  She has history of nonadherence to her medical care.  Discussed importance for close follow-up.   Weakness:  PT and OT consulted.      No concerns based on BMI: Body mass index is 23.03 kg/m.SABRA Significantly low or high BMI is associated with higher medical risk.  Underweight - under 18  overweight - 25 to 29 obese - 30 or more Class 1 obesity: BMI of 30.0 to 34 Class 2 obesity: BMI of 35.0 to 39 Class 3 obesity: BMI of 40.0 to 49 Super Morbid Obesity: BMI 50-59 Super-super Morbid Obesity: BMI 60+ Healthy nutrition and physical activity advised as adjunct to other disease management and risk reduction treatments    DVT prophylaxis: heparin  IV fluids: no continuous IV fluids  Nutrition: cardiac  diet Central lines / other devices: none  Code Status: FULL CODE ACP documentation reviewed:  none on file in [facility]  TOC needs: TBD pend PT/OT eval Medical barriers to dispo: IV abx pneumonia, bronchoscopy in 2 days. Expected medical readiness for discharge pending bronchoscopy.              Subjective / Brief ROS:  Patient reports concern about cancer again  She wants to live so she can see her grandchildren and tell them about God Denies CP/SOB.  Pain controlled.   Denies new weakness.  Tolerating diet.  Reports no concerns w/ urination/defecation.   Family Communication: Pt requested I call her daughter Alisa - reached out 05/19/24 3:21 PM all questions answered     Objective Findings:  Vitals:   05/19/24 0138 05/19/24 0500 05/19/24 0503 05/19/24 0828  BP:   123/81 (!) 151/99  Pulse: 88  71 86  Resp:   16 18  Temp:   98.6 F (37 C) 98.8 F (37.1 C)  TempSrc:      SpO2: 96%  95% 96%  Weight:  56.2 kg    Height:        Intake/Output Summary (Last 24 hours) at 05/19/2024 1521 Last data filed at 05/19/2024 1300 Gross per 24 hour  Intake 580.11 ml  Output --  Net 580.11 ml   Filed Weights   05/17/24 1357 05/18/24 0500 05/19/24 0500  Weight: 62.1 kg 57.4 kg 56.2 kg    Examination:  Physical Exam Constitutional:      General: She is not in acute distress. Cardiovascular:     Rate and Rhythm: Normal rate and regular rhythm.  Pulmonary:     Effort: Pulmonary effort is normal.     Breath sounds: Decreased breath sounds present.  Musculoskeletal:     Right lower leg: No edema.     Left lower leg: No edema.  Neurological:     Mental Status: She is alert and oriented to person, place, and time.  Psychiatric:        Mood and Affect: Mood is anxious.        Behavior: Behavior normal.          Scheduled Medications:   azithromycin   500 mg Oral Daily   heparin   5,000 Units Subcutaneous Q8H   [START ON 05/20/2024] levothyroxine   112 mcg Oral Daily   losartan   25 mg Oral BID   mirtazapine   15 mg Oral QHS   polyethylene glycol  17 g Oral Daily   simvastatin   20 mg Oral Daily   sodium chloride  flush  3 mL Intravenous Q12H    Continuous Infusions:  cefTRIAXone  (ROCEPHIN )  IV 2 g (05/18/24 1608)    PRN Medications:  acetaminophen  **OR** acetaminophen , acetaminophen -codeine , bisacodyl , guaiFENesin -dextromethorphan , ondansetron  **OR** ondansetron  (ZOFRAN ) IV, senna-docusate, sodium phosphate   Antimicrobials from admission:   Anti-infectives (From admission, onward)    Start     Dose/Rate Route Frequency Ordered Stop   05/18/24 1600  cefTRIAXone  (ROCEPHIN ) 2 g in sodium chloride  0.9 % 100 mL IVPB        2 g 200 mL/hr over 30 Minutes Intravenous Every 24 hours 05/17/24 2304     05/18/24 1545  azithromycin  (ZITHROMAX ) tablet 500 mg        500 mg Oral Daily 05/17/24 2304     05/17/24 1545  cefTRIAXone  (ROCEPHIN ) 2 g in sodium chloride  0.9 % 100 mL IVPB        2 g 200 mL/hr over 30  Minutes Intravenous Once 05/17/24 1531 05/17/24 1752   05/17/24 1545  azithromycin  (ZITHROMAX ) 500 mg in sodium chloride  0.9 % 250 mL IVPB  Status:  Discontinued        500 mg 250 mL/hr over 60 Minutes Intravenous  Once 05/17/24 1531 05/17/24 1531   05/17/24 1545  azithromycin  (ZITHROMAX ) tablet 500 mg        500 mg Oral  Once 05/17/24 1531 05/17/24 1709           Data Reviewed:  I have personally reviewed the following...  CBC: Recent Labs  Lab 05/17/24 1401 05/18/24 0725 05/19/24 0705  WBC 27.8* 18.6* 11.6*  HGB 11.5* 9.4* 9.1*  HCT 34.5* 28.6* 27.0*  MCV 94.8 94.1 92.8  PLT 475* 379 357   Basic Metabolic Panel: Recent Labs  Lab 05/17/24 1401 05/18/24 0725 05/19/24 0705  NA 139 139 139  K 4.3 4.2 3.5  CL 99 104 104  CO2 25 25 27   GLUCOSE 119* 80 98  BUN 19 17 13   CREATININE 1.59* 1.34* 1.28*  CALCIUM  8.4* 7.8* 7.7*  MG 1.8  --   --    GFR: Estimated Creatinine Clearance: 29.4 mL/min (A) (by C-G formula based on SCr of 1.28 mg/dL (H)). Liver Function Tests: No results for input(s): AST, ALT, ALKPHOS, BILITOT, PROT, ALBUMIN in the last 168 hours. No results for input(s): LIPASE, AMYLASE in the last 168 hours. No results for input(s): AMMONIA in the last 168 hours. Coagulation Profile: No results for input(s): INR, PROTIME in the last 168 hours. Cardiac Enzymes: No results for input(s): CKTOTAL, CKMB, CKMBINDEX, TROPONINI in the last 168 hours. BNP (last 3 results) No  results for input(s): PROBNP in the last 8760 hours. HbA1C: No results for input(s): HGBA1C in the last 72 hours. CBG: Recent Labs  Lab 05/18/24 0809 05/19/24 0819  GLUCAP 93 82   Lipid Profile: No results for input(s): CHOL, HDL, LDLCALC, TRIG, CHOLHDL, LDLDIRECT in the last 72 hours. Thyroid  Function Tests: No results for input(s): TSH, T4TOTAL, FREET4, T3FREE, THYROIDAB in the last 72 hours. Anemia Panel: No results for input(s): VITAMINB12, FOLATE, FERRITIN, TIBC, IRON , RETICCTPCT in the last 72 hours. Most Recent Urinalysis On File:     Component Value Date/Time   COLORURINE YELLOW (A) 02/06/2021 1957   APPEARANCEUR HAZY (A) 02/06/2021 1957   LABSPEC 1.016 02/06/2021 1957   PHURINE 5.0 02/06/2021 1957   GLUCOSEU NEGATIVE 02/06/2021 1957   HGBUR NEGATIVE 02/06/2021 1957   BILIRUBINUR NEGATIVE 02/06/2021 1957   KETONESUR NEGATIVE 02/06/2021 1957   PROTEINUR 30 (A) 02/06/2021 1957   NITRITE NEGATIVE 02/06/2021 1957   LEUKOCYTESUR MODERATE (A) 02/06/2021 1957   Sepsis Labs: @LABRCNTIP (procalcitonin:4,lacticidven:4) Microbiology: Recent Results (from the past 240 hours)  Resp panel by RT-PCR (RSV, Flu A&B, Covid) Anterior Nasal Swab     Status: None   Collection Time: 05/17/24  3:34 PM   Specimen: Anterior Nasal Swab  Result Value Ref Range Status   SARS Coronavirus 2 by RT PCR NEGATIVE NEGATIVE Final    Comment: (NOTE) SARS-CoV-2 target nucleic acids are NOT DETECTED.  The SARS-CoV-2 RNA is generally detectable in upper respiratory specimens during the acute phase of infection. The lowest concentration of SARS-CoV-2 viral copies this assay can detect is 138 copies/mL. A negative result does not preclude SARS-Cov-2 infection and should not be used as the sole basis for treatment or other patient management decisions. A negative result may occur with  improper specimen collection/handling, submission of specimen other than  nasopharyngeal swab, presence of viral mutation(s) within the areas targeted by this assay, and inadequate number of viral copies(<138 copies/mL). A negative result must be combined with clinical observations, patient history, and epidemiological information. The expected result is Negative.  Fact Sheet for Patients:  bloggercourse.com  Fact Sheet for Healthcare Providers:  seriousbroker.it  This test is no t yet approved or cleared by the [place]  FDA and  has been authorized for detection and/or diagnosis of SARS-CoV-2 by FDA under an Emergency Use Authorization (EUA). This EUA will remain  in effect (meaning this test can be used) for the duration of the COVID-19 declaration under Section 564(b)(1) of the Act, 21 U.S.C.section 360bbb-3(b)(1), unless the authorization is terminated  or revoked sooner.       Influenza A by PCR NEGATIVE NEGATIVE Final   Influenza B by PCR NEGATIVE NEGATIVE Final    Comment: (NOTE) The Xpert Xpress SARS-CoV-2/FLU/RSV plus assay is intended as an aid in the diagnosis of influenza from Nasopharyngeal swab specimens and should not be used as a sole basis for treatment. Nasal washings and aspirates are unacceptable for Xpert Xpress SARS-CoV-2/FLU/RSV testing.  Fact Sheet for Patients: bloggercourse.com  Fact Sheet for Healthcare Providers: seriousbroker.it  This test is not yet approved or cleared by the [place]  FDA and has been authorized for detection and/or diagnosis of SARS-CoV-2 by FDA under an Emergency Use Authorization (EUA). This EUA will remain in effect (meaning this test can be used) for the duration of the COVID-19 declaration under Section 564(b)(1) of the Act, 21 U.S.C. section 360bbb-3(b)(1), unless the authorization is terminated or revoked.     Resp Syncytial Virus by PCR NEGATIVE NEGATIVE Final    Comment:  (NOTE) Fact Sheet for Patients: bloggercourse.com  Fact Sheet for Healthcare Providers: seriousbroker.it  This test is not yet approved or cleared by the [place]  FDA and has been authorized for detection and/or diagnosis of SARS-CoV-2 by FDA under an Emergency Use Authorization (EUA). This EUA will remain in effect (meaning this test can be used) for the duration of the COVID-19 declaration under Section 564(b)(1) of the Act, 21 U.S.C. section 360bbb-3(b)(1), unless the authorization is terminated or revoked.  Performed at [facility], [address] [place], [place] [address]   Blood culture (routine x 2)     Status: None (Preliminary result)   Collection Time: 05/17/24  4:55 PM   Specimen: BLOOD  Result Value Ref Range Status   Specimen Description BLOOD BLOOD LEFT ARM  Final   Special Requests   Final    BOTTLES DRAWN AEROBIC AND ANAEROBIC Blood Culture results may not be optimal due to an inadequate volume of blood received in culture bottles   Culture   Final    NO GROWTH 2 DAYS Performed at [facility], [address], [place], [place] [address]    Report Status PENDING  Incomplete  Blood culture (routine x 2)     Status: None (Preliminary result)   Collection Time: 05/17/24  5:23 PM   Specimen: BLOOD  Result Value Ref Range Status   Specimen Description BLOOD BLOOD RIGHT ARM  Final   Special Requests   Final    BOTTLES DRAWN AEROBIC AND ANAEROBIC Blood Culture results may not be optimal due to an inadequate volume of blood received in culture bottles   Culture   Final    NO GROWTH 2 DAYS Performed at [facility], [address] [place], [place] [address]    Report  Status PENDING  Incomplete      Radiology Studies last 3 days: CT Angio Chest Pulmonary Embolism (PE) W or WO Contrast Result Date: 05/17/2024 EXAM: CTA of the Chest with contrast for PE 05/17/2024 10:32:40 PM  TECHNIQUE: CTA of the chest was performed without and with the administration of 75 mL of intravenous contrast (iohexol  (OMNIPAQUE ) 350 MG/ML injection 60 mL IOHEXOL  350 MG/ML SOLN). Multiplanar reformatted images are provided for review. MIP images are provided for review. Automated exposure control, iterative reconstruction, and/or weight based adjustment of the mA/kV was utilized to reduce the radiation dose to as low as reasonably achievable. COMPARISON: CTA chest dated 01/09/2022. CLINICAL HISTORY: Pulmonary embolism (PE) suspected, high prob. Cough, SOB, abnormal CXR FINDINGS: PULMONARY ARTERIES: Pulmonary arteries are adequately opacified for evaluation. No pulmonary embolism. Main pulmonary artery is normal in caliber. MEDIASTINUM: The heart and pericardium demonstrate no acute abnormality. Mild thoracic aortic atherosclerosis. LYMPH NODES: Thoracic nodal metastases, including a 2.9 cm subcarinal node and 2.4 cm left perihilar node. LUNGS AND PLEURA: Masslike narrowing of the left mainstem bronchus (image 52). Associated peribronchovascular patchy opacity in the central left lower lobe, suggesting tumor, with post obstructive patchy opacity versus lymphangitic spread of tumor in the lingula and left lower lobe. Dominant 4.3 cm lobulated anterior left lower lobe mass (image 73). Additional dominant 3.8 cm right apical mass (image 18). Additional 7 mm satellite nodule/metastasis in the posterior left upper lobe (image 33). Small left pleural effusion. No pneumothorax. UPPER ABDOMEN: Limited images of the upper abdomen are unremarkable. SOFT TISSUES AND BONES: No acute bone or soft tissue abnormality. IMPRESSION: 1. No pulmonary embolism. 2. Masslike narrowing of the left mainstem bronchus. Two dominant masses and right lung apex and left upper lobe. 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 3. Left lower lobe/lingular postobstructive opacity vs lymphangitic spread of tumor.  4. Subcarinal and left perihilar nodal metastases. 5. Small left pleural effusion. Electronically signed by: Pinkie Pebbles MD 05/17/2024 10:43 PM EST RP Workstation: [id number]   DG Chest 2 View Result Date: 05/17/2024 EXAM: 2 VIEW(S) XRAY OF THE CHEST 05/17/2024 02:30:00 PM COMPARISON: Chest CT 01/09/2022. CLINICAL HISTORY: Cough and SOB. FINDINGS: LUNGS AND PLEURA: There is a new round 3.5 cm right apical mass. Patchy airspace opacities and masslike opacity are seen in the inferior left upper lobe, also new from prior. No pleural effusion. No pneumothorax. HEART AND MEDIASTINUM: Aortic atherosclerosis. No acute abnormality of the cardiac and mediastinal silhouettes. BONES AND SOFT TISSUES: Degenerative changes in the left shoulder. IMPRESSION: 1. New 3.5 cm right apical pulmonary mass, suspicious for malignancy, and new patchy left upper lobe airspace opacity with a masslike component. Chest CT is recommended for further evaluation. 2. Small left pleural effusion without pneumothorax. Electronically signed by: Greig Pique MD 05/17/2024 03:24 PM EST RP Workstation: [id number]       Laneta Blunt, DO [facility] 05/19/2024, 3:21 PM    Dictation software may have been used to generate the above note. Typos may occur and escape review in typed/dictated notes. Please contact Dr Blunt directly for clarity if needed.  Staff may message me via secure chat in Epic  but this may not receive an immediate response,  please page me for urgent matters!  If 7PM-7AM, please contact night coverage [email or web address]       "

## 2024-05-19 NOTE — Consult Note (Signed)
 " Consultation Note Date: 05/19/2024   Patient Name: Rebecca Parker  DOB: May 21, 1950  MRN: [id number]  Age / Sex: 74 y.o., female   PCP: Melvin Pao, NP Referring Physician: Marsa Edelman, DO  Reason for Consultation: Establishing goals of care     Chief Complaint/History of Present Illness:  Patient is a 74 year old female with history of hypertension, hyperlipidemia, type 2 diabetes, cervical cancer and thyroid  cancer who presented to the ED with cough and shortness of breath.  Reports she has just been feeling unwell and had decreased appetite.  Imaging revealed new pulmonary mass concerning for malignancy along with upper lobe space opacity.  Pulmonology has evaluated and plan for bronchoscopy tomorrow with hopes of obtaining tissue sample.  Chart reviewed including personal review of most recent labs, imaging, and notes from pulmonary and primary hospitalist.  I met today with Ms. Verge.  On arrival, she was lying in bed and reading her Bible.  I introduced palliative care as specialized medical care for people living with serious illness. It focuses on providing relief from the symptoms and stress of a serious illness. The goal is to improve quality of life for both the patient and the family.  She reports that the most important thing to her is her family and her faith.  States that she has brothers and sisters, a son and a daughter (had 2 sons but 1 has died), and 58 grandchildren.  States that she needs to live in order to make sure that her grandchildren are told about God and His love for them.  I asked about her understanding of what is going on and she immediately became tearful.  She said that she had had doctors come in to talk with and they look at her, like I am already dead.  She understands that she has mass noted on imaging of her lung and plan is for bronchoscopy and biopsy tomorrow.  She then states, it cannot be cancer.  I keep telling my brother and sister I  am worried that it is cancer.  She then relayed previous journey with cancer and emotional trauma associated with this.  We talked about her hope for the future and she reports that her hope is that she does not have cancer.  If this does turn out to be a cancer diagnosis, however, she is interested in any and all interventions available to prolong her life.  She then began to cry again.  Discussed plan for bronchoscopy tomorrow.  I told her I would stop by to check into follow-up to see how she is feeling and if there is anything I can do to further support her at this time.   Primary Diagnoses  Present on Admission:  Chronic diastolic CHF (congestive heart failure), NYHA class 2 (HCC)  Benign essential hypertension  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HCC)  Mixed hyperlipidemia  Stage 3b chronic kidney disease (HCC)  Thyroid  cancer (HCC)  Sepsis due to pneumonia [facility])   Palliative Review of Systems: Patient denies pain.  Reports some shortness of breath and cough.  Lots of anxiety.  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, cN1, cM0); Tx'd with Cisplatin  + EBRT   Chest pain, unspecified    CHF (congestive heart failure) (HCC)    Chronic diastolic heart failure, NYHA class 2 (HCC)    Cocaine  use    none since Cancer dx (11/2021)   Diabetes mellitus without complication (HCC)    Edentulous    lost dentures   Grade I diastolic dysfunction    Hepatitis    Hip pain    HLD (hyperlipidemia)    Hyperplastic colon polyp    Hypertension    Hypothyroidism    Multinodular goiter    Port-A-Cath in place    Thyroid  cancer (HCC)    Thyroid  mass    a.) 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)   Tubular adenoma of colon    Social History   Socioeconomic History   Marital status: Legally Separated    Spouse name: Not on file    Number of children: Not on file   Years of education: Not on file   Highest education level: Not on file  Occupational History   Not on file  Tobacco Use   Smoking status: Former    Current packs/day: 0.10    Average packs/day: 0.1 packs/day for 60.1 years (6.0 ttl pk-yrs)    Types: Cigarettes    Start date: 04/29/1964    Passive exposure: Never   Smokeless tobacco: Never  Vaping Use   Vaping status: Never Used  Substance and Sexual Activity   Alcohol use: Not Currently   Drug use: Yes    Types: Crack cocaine    Comment: Patient is trying to quit   Sexual activity: Not Currently  Other Topics Concern   Not on file  Social History Narrative   Not on file   Social Drivers of Health   Tobacco Use: Medium Risk (05/17/2024)   Patient History    Smoking Tobacco Use: Former    Smokeless Tobacco Use: Never    Passive Exposure: Never  Physicist, Medical Strain: Not on file  Food Insecurity: No Food Insecurity (05/17/2024)   Epic    Worried About Programme Researcher, Broadcasting/film/video in the Last Year: Never true    Ran Out of Food in the Last Year: Never true  Transportation Needs: No Transportation Needs (05/17/2024)   Epic    Lack of Transportation (Medical): No    Lack of Transportation (Non-Medical): No  Physical Activity: Not on file  Stress: Not on file  Social Connections: Moderately Isolated (05/17/2024)   Social Connection and Isolation Panel    Frequency of Communication with Friends and Family: Three times a week    Frequency of Social Gatherings with Friends and Family: Twice a week    Attends Religious Services: 1 to 4 times per year    Active Member of Clubs or Organizations: No    Attends Banker Meetings: Never    Marital Status: Separated  Depression (PHQ2-9): Medium Risk (04/27/2024)   Depression (PHQ2-9)    PHQ-2 Score: 9  Alcohol Screen: Not on file  Housing: Low Risk (05/17/2024)   Epic    Unable to Pay for Housing in the Last Year: No    Number of Times  Moved in the Last Year: 0    Homeless in the Last Year: No  Utilities: Not At Risk (05/17/2024)   Epic    Threatened with loss of utilities: No  Health Literacy: Not on file   Family History  Problem Relation Age of Onset   Hypertension Mother    Breast cancer Maternal Aunt    Breast cancer Paternal Aunt    Hypertension Maternal Grandfather    Scheduled Meds:  azithromycin   500  mg Oral Daily   heparin   5,000 Units Subcutaneous Q8H   [START ON 05/20/2024] levothyroxine   112 mcg Oral Q0600   losartan   25 mg Oral BID   mirtazapine   15 mg Oral QHS   polyethylene glycol  17 g Oral Daily   simvastatin   20 mg Oral Daily   sodium chloride  flush  3 mL Intravenous Q12H   Continuous Infusions:  cefTRIAXone  (ROCEPHIN )  IV 2 g (05/19/24 1546)   PRN Meds:.acetaminophen  **OR** acetaminophen , acetaminophen -codeine , bisacodyl , guaiFENesin -dextromethorphan , ondansetron  **OR** ondansetron  (ZOFRAN ) IV, senna-docusate, sodium phosphate  Allergies[1] CBC:    Component Value Date/Time   WBC 11.6 (H) 05/19/2024 0705   HGB 9.1 (L) 05/19/2024 0705   HGB 11.2 (L) 04/27/2024 1431   HGB 12.3 03/22/2024 1037   HCT 27.0 (L) 05/19/2024 0705   HCT 36.9 03/22/2024 1037   PLT 357 05/19/2024 0705   PLT 435 (H) 04/27/2024 1431   PLT 399 03/22/2024 1037   MCV 92.8 05/19/2024 0705   MCV 99 (H) 03/22/2024 1037   MCV 90 11/01/2013 1021   NEUTROABS 5.1 03/22/2024 1037   LYMPHSABS 1.2 03/22/2024 1037   MONOABS 0.6 08/02/2023 1114   EOSABS 0.4 03/22/2024 1037   BASOSABS 0.0 03/22/2024 1037   Comprehensive Metabolic Panel:    Component Value Date/Time   NA 139 05/19/2024 0705   NA 140 03/22/2024 1037   NA 141 11/01/2013 1021   K 3.5 05/19/2024 0705   K 4.0 11/01/2013 1021   CL 104 05/19/2024 0705   CL 109 (H) 11/01/2013 1021   CO2 27 05/19/2024 0705   CO2 27 11/01/2013 1021   BUN 13 05/19/2024 0705   BUN 14 03/22/2024 1037   BUN 8 11/01/2013 1021   CREATININE 1.28 (H) 05/19/2024 0705   CREATININE  1.13 11/01/2013 1021   GLUCOSE 98 05/19/2024 0705   GLUCOSE 102 (H) 11/01/2013 1021   CALCIUM  7.7 (L) 05/19/2024 0705   CALCIUM  8.4 (L) 11/01/2013 1021   AST 17 04/27/2024 1431   AST 19 11/01/2013 1021   ALT 5 04/27/2024 1431   ALT 22 11/01/2013 1021   ALKPHOS 76 04/27/2024 1431   ALKPHOS 70 11/01/2013 1021   BILITOT <0.2 04/27/2024 1431   BILITOT <0.2 03/22/2024 1037   BILITOT 0.2 11/01/2013 1021   PROT 6.8 04/27/2024 1431   PROT 6.3 03/22/2024 1037   PROT 7.3 11/01/2013 1021   ALBUMIN 3.4 (L) 04/27/2024 1431   ALBUMIN 3.7 (L) 03/22/2024 1037   ALBUMIN 3.0 (L) 11/01/2013 1021    Physical Exam: Vital Signs: BP (!) 134/98 (BP Location: Right Arm)   Pulse 98   Temp 98.3 F (36.8 C)   Resp 18   Ht 5' 1.5 (1.562 m)   Wt 56.2 kg   SpO2 98%   BMI 23.03 kg/m  SpO2: SpO2: 98 % O2 Device: O2 Device: Room Air O2 Flow Rate: O2 Flow Rate (L/min): 2 L/min Intake/output summary:  Intake/Output Summary (Last 24 hours) at 05/19/2024 2216 Last data filed at 05/19/2024 1300 Gross per 24 hour  Intake 580.11 ml  Output --  Net 580.11 ml   LBM: Last BM Date : 05/16/24 Baseline Weight: Weight: 62.1 kg Most recent weight: Weight: 56.2 kg  General: NAD, alert Eyes: conjunctiva clear, anicteric sclera HENT: normocephalic, atraumatic, moist mucous membranes Cardiovascular: RRR, no edema in LE b/l Respiratory: no increased work of breathing noted, decreased bilaterally Abdomen: not distended Skin: no rashes or lesions on visible skin Psych: appropriately answers all questions, anxious and tearful  Additional Data Reviewed: Recent Labs    05/18/24 0725 05/19/24 0705  WBC 18.6* 11.6*  HGB 9.4* 9.1*  PLT 379 357  NA 139 139  BUN 17 13  CREATININE 1.34* 1.28*    Imaging: CT Angio Chest Pulmonary Embolism (PE) W or WO Contrast EXAM: CTA of the Chest with contrast for PE 05/17/2024 10:32:40 PM  TECHNIQUE: CTA of the chest was performed without and with the  administration of 75 mL of intravenous contrast (iohexol  (OMNIPAQUE ) 350 MG/ML injection 60 mL IOHEXOL  350 MG/ML SOLN). Multiplanar reformatted images are provided for review. MIP images are provided for review. Automated exposure control, iterative reconstruction, and/or weight based adjustment of the mA/kV was utilized to reduce the radiation dose to as low as reasonably achievable.  COMPARISON: CTA chest dated 01/09/2022.  CLINICAL HISTORY: Pulmonary embolism (PE) suspected, high prob. Cough, SOB, abnormal CXR  FINDINGS:  PULMONARY ARTERIES: Pulmonary arteries are adequately opacified for evaluation. No pulmonary embolism. Main pulmonary artery is normal in caliber.  MEDIASTINUM: The heart and pericardium demonstrate no acute abnormality. Mild thoracic aortic atherosclerosis.  LYMPH NODES: Thoracic nodal metastases, including a 2.9 cm subcarinal node and 2.4 cm left perihilar node.  LUNGS AND PLEURA: Masslike narrowing of the left mainstem bronchus (image 52). Associated peribronchovascular patchy opacity in the central left lower lobe, suggesting tumor, with post obstructive patchy opacity versus lymphangitic spread of tumor in the lingula and left lower lobe. Dominant 4.3 cm lobulated anterior left lower lobe mass (image 73). Additional dominant 3.8 cm right apical mass (image 18). Additional 7 mm satellite nodule/metastasis in the posterior left upper lobe (image 33). Small left pleural effusion. No pneumothorax.  UPPER ABDOMEN: Limited images of the upper abdomen are unremarkable.  SOFT TISSUES AND BONES: No acute bone or soft tissue abnormality.  IMPRESSION: 1. No pulmonary embolism. 2. Masslike narrowing of the left mainstem bronchus. Two dominant masses and right lung apex and left upper lobe. 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 3. Left lower lobe/lingular postobstructive opacity vs lymphangitic spread of tumor. 4.  Subcarinal and left perihilar nodal metastases. 5. Small left pleural effusion.  Electronically signed by: Pinkie Pebbles MD 05/17/2024 10:43 PM EST RP Workstation: [id number] DG Chest 2 View EXAM: 2 VIEW(S) XRAY OF THE CHEST 05/17/2024 02:30:00 PM  COMPARISON: Chest CT 01/09/2022.  CLINICAL HISTORY: Cough and SOB.  FINDINGS:  LUNGS AND PLEURA: There is a new round 3.5 cm right apical mass. Patchy airspace opacities and masslike opacity are seen in the inferior left upper lobe, also new from prior. No pleural effusion. No pneumothorax.  HEART AND MEDIASTINUM: Aortic atherosclerosis. No acute abnormality of the cardiac and mediastinal silhouettes.  BONES AND SOFT TISSUES: Degenerative changes in the left shoulder.  IMPRESSION: 1. New 3.5 cm right apical pulmonary mass, suspicious for malignancy, and new patchy left upper lobe airspace opacity with a masslike component. Chest CT is recommended for further evaluation. 2. Small left pleural effusion without pneumothorax.  Electronically signed by: Greig Pique MD 05/17/2024 03:24 PM EST RP Workstation: [id number]    I personally reviewed recent imaging.   Palliative Care Assessment and Plan Summary of Established Goals of Care and Medical Treatment Preferences    # Complex medical decision making/goals of care  - Full code/full scope  - Plan for bronchoscopy with biopsy tomorrow.  Patient is very anxious about likelihood this is cancer.  She has previous cancer diagnosis and very tearful when discussing this during my encounter.  - She had  2 sons and 1 daughter.  1 son has died.  Therefore, her surrogate decision makers would be her son and daughter.  She reports that this is appropriate and who she would want to make decisions on her behalf.  Not interested in [facility] documents.  - Palliative to continue to follow for emotional support and possible progression of conversation if biopsy results return before she  discharges from the hospital.  She is clear that she wants to have biopsy done and follow-up with oncology to discuss potential treatment options if this is cancer.  -  Code Status: Full Code  Prognosis: Unable to determine  # Psycho-social/Spiritual Support:  - Support System: Son and daughter, many family - Desire for further Chaplain support: Has already been consulted  # Discharge Planning:  To Be Determined  Thank you for allowing the palliative care team to participate in the care [name].  Amaryllis Meissner, MD Palliative Care Provider PMT # [phone]  If patient remains symptomatic despite maximum doses, please call PMT at [phone] between 0700 and 1900. Outside of these hours, please call attending, as PMT does not have night coverage.   I personally spent a total of 60 minutes in the care of the patient today including preparing to see the patient, getting/reviewing separately obtained history, performing a medically appropriate exam/evaluation, and documenting clinical information in the EHR.     [1]  Allergies Allergen Reactions   Oxybutynin  Itching and Other (See Comments)   Pregabalin  Other (See Comments)    Hallucentations   Ace Inhibitors Itching    Rash   Cyclobenzaprine Itching    Rash and 'made me nervous'   "

## 2024-05-19 NOTE — Consult Note (Signed)
 [facility] CONSULT NOTE  Patient Care Team: Melvin Pao, NP as PCP - General (Nurse Practitioner) Argentina Clap, MD as PCP - Cardiology (Cardiology) Maurie Rayfield BIRCH, RN as Oncology Nurse Navigator Babara Call, MD as Consulting Physician (Oncology) Lenn Aran, MD as Consulting Physician (Radiation Oncology) Dasie Tinnie MATSU, NP as Nurse Practitioner (Nurse Practitioner) Mancil Barter, MD as Referring Physician (Obstetrics)  CHIEF COMPLAINTS/PURPOSE OF CONSULTATION: Lung masses-concerning for malignancy  Oncology History  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HCC)  12/23/2020 Initial Diagnosis   Invasive carcinoma of cervix 12/01/2020-8/9/022 She was admitted from the ED for 1 day h/o right lower abdominal / pelvic pain . 12/01/2020 CT abdomen pelvis w contrast showed  thickly septated right ovarian mass measuring 5.1 x 5.1 cm. 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 Recommend initial pelvic ultrasound and likely subsequent MRI to further evaluate, which may be performed on a nonemergent basis. 2. Air within the fundal endometrial cavity. Correlate for recent instrumentation. 3. Pancolonic diverticulosis without evidence of acute diverticulitis.   12/01/2020 Fx D+C  and cervical bx. Uterus sounded to 9 cm.    TVUS POD#1 showed a complex right ovarian cyst with normal doppler flow Pathology- squamous cell CA of the cervix, as well as cancer in endocervix curettage and endometrial curettings    Patient was initially treatment for urosepsis, urine culture came back negative She was treated with IV antibiotics and transitioned to Augmentin  and Flagyl .    Diastolic CHF and Diabetes.  UDS + for cocaine.    She is the oldest of 7 kids (5 girls and 2 boys). She has 3 children - all SVDs - 2 boys and one girl.   She was seen by GynOnc Dr.Secord.  Her pelvic examination showed On palpations suspect lateral vaginal disease >50% down the vault and disease involving the  left fornix. Cervix enlarged >4 cm with grossly obvious tumor and hard to palpation. Uterus - may be enlarged with firm mass on the right aspect versus palpation of the right adnexal mass. Positive parametrial involvement on the right and on the left with disease to or almost to the sidewall on the left. Rectovaginal exam was confirmatory.     # 8/31 2022, PET scan showed signs of cervical cancer with diffuse involvement of the uterus.  Cystic and solid right ovarian lesion more likely related to diffuse cervical cancer.  Concomitant synchronous cystic ovarian neoplasm could have appearance but feel less likely given the diffuse nature of disease throughout the uterus and cervix.  Small lymph nodes in the pelvis in the left pelvis suspicious for nodal involvement at the common iliac level.  No solid organ distant metastasis. Right lung renal consult and glossotonsillar sulcus uptake which is asymmetric and with increased fullness.  While this may be physiologic, will suggest direct visualization for further evaluation to exclude neoplasm. Right thyroid  uptake with visible nodule.  Recommend ultrasound thyroid  and biopsy.   Patient has had baseline audiogram done and chemotherapy class. 01/03/2021 cisplatin  20 mg/m2 today.  Dose were reduced to 50% due to her impaired kidney function.  Treatment plan was switched to carboplatin  given her decreased kidney function 01/10/2021 carboplatin . Kidney function improved. 01/17/2021 cisplatin .  02/01/2021 cisplatin  02/08/2021 cisplatin  Last radiation 02/08/2021.   PD-L1 CPS 20%   01/17/2021 - 02/08/2021, concurrent chemoradiation to cervical cancer. S/p intracavitary radiation at [facility] with Dr. Lurline.  Patient follows up with gynecology oncology Dr. Elby for surveillance.   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 Background: There is not a family history of thyroid  disease. There is not a history of x-ray exposure to the head or neck area. There is not a history of prior nuclear radiation exposure. Baseline thyroid /neck mass symptoms include anterior neck swelling, but not anterior neck pain, anterior neck tenderness, difficulty swallowing/dysphagia, painful swallowing/odynophagia, globus, hoarseness, voice change/dysphonia, cough, positional dyspnea, and persistent anterior cervical lymphadenopathy. Oncologic history: 03/09/14 US  Thyroid  + Neck @ [facility] with right nodule at 3.5x2.5x3.3-cm, left mid-lobe/lateral nodule at 2.1x1.1x1.4-cm, left mid-lobe/medial nodule at 0.8x0.7x0.8-cm, left mid-inferior nodule at 0.7x0.6x0.6-cm, and left inferior pole nodule at 0.9x0.9x1.2-cm and no nodal findings  04/25/14 US -guided Thyroid  biopsies of right mid-lobe 3.5-cm nodule and left mid-lobe/lateral nodule at 2.1-cm @ [facility] by Dr Therisa Eleanor Leep with benign cytology / Bethesda II for both nodules through [facility]  11/17/14 US  Thyroid  + Neck @ [facility] with right dominant nodule at 3.6x2.3x2.8-cm with adjacent nodules at 0.9x0.5x0.7-cm and 0.9x0.5x0.8-cm, left mid-lobe/lateral nodule at 1.8x1.1x1.5-cm, left mid-lobe/medial nodule at 1.1x0.7x0.8-cm, left mid-inferior nodule at 0.7x0.6x0.6-cm, and left inferior nodule at 1.1x0.x1.0-cm and no nodal findings  +12/27/20 FDG PET thru [facility] reporting hypermetabolic right thyroid  nodule at 2.8-cm with SUV max of 5.9 with nodularity of left lobe, asymmetric fullness of right glossotonsillar sulcus with SUV max of 7.6, no cervical  lymphadenopathy, no pulmonary nodules, and multiple sites of abdominal and pelvic activity  +02/20/21 US  Thyroid  @ [facility]) reporting right mid-lobe 4.5x0.9x3.3-cm nodule with TIRADS 4/4, right inferior 1.0x0.7x0.9-cm nodule with TIRADS 3/3, 2nd right inferior 1.1x1.0x0.9-cm nodule with TIRADS 3/3, left mid-lobe 1.3x1.0x0.7-cm nodule with TIRADS 4/4, 2nd left mid-lobe 0.9x0.7x0.7-cm nodule with TIRADS 4/4, and left inferior 1.2x1.0x1.0-cm nodule with TIRADS 3/3  +03/05/21 US -guided biopsy of right mid-lobe thyroid  nodule @ [facility]) with indeterminate cytology / Bethesda IV as Suspicious for Follicular Neoplasm (SFN) with subsequent molecular testing thru [facility] using ThyroSeq V3 GC positive for HRAS Q61R and TERT mutations thus high (>95) probability of cancer  +05/09/21 FDG PET thru [facility] with persistent hypermetabolic right thyroid  nodule with SUV max of 5.0, left thyroid  nodularity without PET correlation, no cervical lymphadenopathy, and improved abdominopelvic findings  +09/17/21 CT Chest/Abdomen/Pelvis with contrast thru [facility] reporting persistent right thyroid  nodule at 2.8-cm, and new findings of right lower lobe lung nodule at 0.5-cm and left lingular lung nodule at 0.3-cm  10/29/21  Physician-performed US  Thyroid  + Neck @ [facility] by Dr Toribio Novel with suspect nodular disease without suggestion of extrathyroidal extension or suspect cervical lymphadenopathy Flexible laryngoscopy @ [facility] by Dr Toribio Novel with normal vocal cord motion  12/18/21 Total thyroidectomy @ [facility] by Dr Toribio Novel with surgical pathology showing: 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) at 4.8x2.9x2.6-cm with no mitoses, no tumor necrosis, negative margins, no vascular invasion (-VI), no lymphatic invasion (-LI), no perineural invasion (-PNI), no extrathyroidal extension (-ETE), and no lymph nodes thus AJCC TNM  staging of [id number] with additional findings of benign adenomatoid nodule(s) or nodular follicular disease and mild  chronic lymphocytic thyroiditis  Multifocal left-sided Papillary Thyroid  Carcinoma (PTC) at 0.9-cm and 0.8-cm and 0.1-cm with no mitoses, no tumor necrosis, no vascular invasion (-VI), no lymphatic invasion (-LI), no perineural invasion (-PNI), no extrathyroidal extension (-ETE), and no lymph nodes thus AJCC TNM staging of [id number]   +01/09/22 CT Chest/Abdomen/Pelvis with contrast thru [place] reporting no thyroid , tracheal, or esophageal findings; no mediastinal or hilar lymphadenopathy, stable right lower lobe lung ground-glass nodule at 0.3-cm, stable left upper lobe lung nodule at 0.3-cm, and stable left lower lobe lung nodule at 0.4-cm       HISTORY OF PRESENTING ILLNESS: Patient resting in the chair by the bed side. Alone.   Rebecca Parker 74 y.o.  female pleasant patient with a history of cervical cancer stage III, chronic kidney disease anemia presents for evaluation of persistent cough and dyspnea with new imaging findings concerning for suspected lung cancer.  She has experienced persistent cough and dyspnea for six to seven months, with onset in July or August of the previous year. The cough is most severe at night, resulting in frequent awakenings, and is exacerbated by dry weather and exposure to dry heat from her gas heating system. Use of a humidifier at night provides partial relief. She is unable to tolerate certain food odors, which provoke nausea and worsen her breathing. Despite these interventions, she continues to have significant nocturnal symptoms and has not achieved symptomatic relief from prior providers.  Recent CT imaging demonstrated mass-like narrowing of the left main bronchus, a dominant 4.3 cm mass in the left lower lobe, a 3.8 cm mass in the right upper lobe, and subcarinal and hilar lymphadenopathy. These findings are concerning for possible lung  malignancy, though definitive diagnosis is pending further evaluation with PET scan and bronchoscopy/biopsy. She is also undergoing treatment for pneumonia.  She has chronic kidney disease with recent laboratory evidence of mildly decreased renal function and anemia, with hemoglobin measured at 9 g/dL. She denies fevers, chills, or night sweats, but endorses chronic fatigue and malaise. There have been no recent changes to her medication regimen.  She expresses significant anxiety and fear regarding her health and prognosis, referencing her prior cancer diagnoses and recent family losses to cancer. She is supported by her daughter Rebecca Parker. She has a history of tobacco use but quit smoking after finding cigarettes unpalatable.  Review of Systems  Constitutional:  Positive for chills, malaise/fatigue and weight loss. Negative for diaphoresis and fever.  HENT:  Negative for nosebleeds and sore throat.   Eyes:  Negative for double vision.  Respiratory:  Positive for cough and shortness of breath. Negative for hemoptysis, sputum production and wheezing.   Cardiovascular:  Negative for chest pain, palpitations, orthopnea and leg swelling.  Gastrointestinal:  Negative for abdominal pain, blood in stool, constipation, diarrhea, heartburn, melena, nausea and vomiting.  Genitourinary:  Negative for dysuria, frequency and urgency.  Musculoskeletal:  Negative for back pain and joint pain.  Skin: Negative.  Negative for itching and rash.  Neurological:  Negative for dizziness, tingling, focal weakness, weakness and headaches.  Endo/Heme/Allergies:  Does not bruise/bleed easily.  Psychiatric/Behavioral:  Negative for depression. The patient is not nervous/anxious and does not have insomnia.     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, cN1, cM0); Tx'd with Cisplatin  +  EBRT  Chest pain, unspecified    CHF (congestive heart failure) (HCC)    Chronic diastolic heart failure, NYHA class 2 (HCC)    Cocaine use    none since Cancer dx (11/2021)   Diabetes mellitus without complication (HCC)    Edentulous    lost dentures   Grade I diastolic dysfunction    Hepatitis    Hip pain    HLD (hyperlipidemia)    Hyperplastic colon polyp    Hypertension    Hypothyroidism    Multinodular goiter    Port-A-Cath in place    Thyroid  cancer (HCC)    Thyroid  mass    a.) 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)   Tubular adenoma of colon     SURGICAL HISTORY: Past Surgical History:  Procedure Laterality Date   ABDOMINAL HYSTERECTOMY     CATARACT EXTRACTION W/PHACO Left 08/14/2023   Procedure: PHACOEMULSIFICATION, CATARACT, WITH IOL INSERTION  3.93  00:31.6;  Surgeon: Enola Feliciano Hugger, MD;  Location: [facility];  Service: Ophthalmology;  Laterality: Left;   CATARACT EXTRACTION W/PHACO Right 08/28/2023   Procedure: PHACOEMULSIFICATION, CATARACT, WITH IOL INSERTION 2.67 00:21.9;  Surgeon: Enola Feliciano Hugger, MD;  Location: [facility];  Service: Ophthalmology;  Laterality: Right;   COLONOSCOPY WITH PROPOFOL  N/A 11/24/2015   Procedure: COLONOSCOPY WITH PROPOFOL ;  Surgeon: Gladis RAYMOND Mariner, MD;  Location: [facility] ENDOSCOPY;  Service: Endoscopy;  Laterality: N/A;   COLONOSCOPY WITH PROPOFOL  N/A 03/18/2019   Procedure: COLONOSCOPY WITH PROPOFOL ;  Surgeon: Janalyn Keene NOVAK, MD;  Location: [facility] ENDOSCOPY;  Service: Endoscopy;  Laterality: N/A;   DILATION AND CURETTAGE OF UTERUS N/A 12/01/2020   Procedure: DILATATION AND CURETTAGE with cervical biopsies;  Surgeon: Schermerhorn, Debby PARAS, MD;  Location: [facility] ORS;  Service: Gynecology;  Laterality: N/A;   ESOPHAGOGASTRODUODENOSCOPY (EGD) WITH PROPOFOL  N/A 03/18/2019   Procedure: ESOPHAGOGASTRODUODENOSCOPY (EGD) WITH PROPOFOL ;  Surgeon: Janalyn Keene NOVAK, MD;   Location: [facility] ENDOSCOPY;  Service: Endoscopy;  Laterality: N/A;   PORTA CATH INSERTION N/A 01/16/2021   Procedure: PORTA CATH INSERTION;  Surgeon: Jama Cordella MATSU, MD;  Location: [facility] INVASIVE CV LAB;  Service: Cardiovascular;  Laterality: N/A;   PORTA CATH REMOVAL N/A 10/28/2023   Procedure: PORTA CATH REMOVAL;  Surgeon: Jama Cordella MATSU, MD;  Location: [facility] INVASIVE CV LAB;  Service: Cardiovascular;  Laterality: N/A;   right ankle orif     THYROIDECTOMY, COMPLETION Bilateral 12/18/2021    SOCIAL HISTORY: Social History   Socioeconomic History   Marital status: Legally Separated    Spouse name: Not on file   Number of children: Not on file   Years of education: Not on file   Highest education level: Not on file  Occupational History   Not on file  Tobacco Use   Smoking status: Former    Current packs/day: 0.10    Average packs/day: 0.1 packs/day for 60.1 years (6.0 ttl pk-yrs)    Types: Cigarettes    Start date: 04/29/1964    Passive exposure: Never   Smokeless tobacco: Never  Vaping Use   Vaping status: Never Used  Substance and Sexual Activity   Alcohol use: Not Currently   Drug use: Yes    Types: Crack cocaine    Comment: Patient is trying to quit   Sexual activity: Not Currently  Other Topics Concern   Not on file  Social History Narrative   Not on file   Social Drivers of Health   Tobacco Use: Medium Risk (05/17/2024)  Patient History    Smoking Tobacco Use: Former    Smokeless Tobacco Use: Never    Passive Exposure: Never  Programmer, Applications: Not on file  Food Insecurity: No Food Insecurity (05/17/2024)   Epic    Worried About Programme Researcher, Broadcasting/film/video in the Last Year: Never true    Ran Out of Food in the Last Year: Never true  Transportation Needs: No Transportation Needs (05/17/2024)   Epic    Lack of Transportation (Medical): No    Lack of Transportation (Non-Medical): No  Physical Activity: Not on file  Stress: Not on file  Social Connections:  Moderately Isolated (05/17/2024)   Social Connection and Isolation Panel    Frequency of Communication with Friends and Family: Three times a week    Frequency of Social Gatherings with Friends and Family: Twice a week    Attends Religious Services: 1 to 4 times per year    Active Member of [employer] or Organizations: No    Attends Banker Meetings: Never    Marital Status: Separated  Intimate Partner Violence: Not At Risk (05/17/2024)   Epic    Fear of Current or Ex-Partner: No    Emotionally Abused: No    Physically Abused: No    Sexually Abused: No  Depression (PHQ2-9): Medium Risk (04/27/2024)   Depression (PHQ2-9)    PHQ-2 Score: 9  Alcohol Screen: Not on file  Housing: Low Risk (05/17/2024)   Epic    Unable to Pay for Housing in the Last Year: No    Number of Times Moved in the Last Year: 0    Homeless in the Last Year: No  Utilities: Not At Risk (05/17/2024)   Epic    Threatened with loss of utilities: No  Health Literacy: Not on file    FAMILY HISTORY: Family History  Problem Relation Age of Onset   Hypertension Mother    Breast cancer Maternal Aunt    Breast cancer Paternal Aunt    Hypertension Maternal Grandfather     ALLERGIES:  is allergic to oxybutynin , pregabalin , ace inhibitors, and cyclobenzaprine.  MEDICATIONS:  Current Facility-Administered Medications  Medication Dose Route Frequency Provider Last Rate Last Admin   acetaminophen  (TYLENOL ) tablet 650 mg  650 mg Oral Q6H PRN Khan, Ghalib, MD   650 mg at 05/19/24 1541   Or   acetaminophen  (TYLENOL ) suppository 650 mg  650 mg Rectal Q6H PRN Fernand Prost, MD       acetaminophen -codeine  120-12 MG/5ML solution 10 mL  10 mL Oral Q6H PRN Parris Manna, MD   10 mL at 05/18/24 1634   azithromycin  (ZITHROMAX ) tablet 500 mg  500 mg Oral Daily Khan, Ghalib, MD   500 mg at 05/19/24 9073   bisacodyl  (DULCOLAX) suppository 10 mg  10 mg Rectal Daily PRN Alexander, Natalie, DO   10 mg at 05/19/24 1541    cefTRIAXone  (ROCEPHIN ) 2 g in sodium chloride  0.9 % 100 mL IVPB  2 g Intravenous Q24H Khan, Ghalib, MD 200 mL/hr at 05/19/24 1546 2 g at 05/19/24 1546   guaiFENesin -dextromethorphan  (ROBITUSSIN DM) 100-10 MG/5ML syrup 5 mL  5 mL Oral Q4H PRN Aleskerov, Fuad, MD   5 mL at 05/19/24 1530   heparin  injection 5,000 Units  5,000 Units Subcutaneous Q8H Khan, Ghalib, MD   5,000 Units at 05/19/24 1530   [START ON 05/20/2024] levothyroxine  (SYNTHROID ) tablet 112 mcg  112 mcg Oral Q0600 Alexander, Natalie, DO       losartan  (COZAAR ) tablet 25  mg  25 mg Oral BID Alexander, Natalie, DO       mirtazapine  (REMERON ) tablet 15 mg  15 mg Oral QHS Alexander, Natalie, DO       ondansetron  (ZOFRAN ) tablet 4 mg  4 mg Oral Q6H PRN Khan, Ghalib, MD       Or   ondansetron  (ZOFRAN ) injection 4 mg  4 mg Intravenous Q6H PRN Khan, Ghalib, MD       polyethylene glycol (MIRALAX  / GLYCOLAX ) packet 17 g  17 g Oral Daily Alexander, Natalie, DO   17 g at 05/19/24 1133   senna-docusate (Senokot-S) tablet 1 tablet  1 tablet Oral QHS PRN Fernand Prost, MD       simvastatin  (ZOCOR ) tablet 20 mg  20 mg Oral Daily Alexander, Natalie, DO   20 mg at 05/19/24 1530   sodium chloride  flush (NS) 0.9 % injection 3 mL  3 mL Intravenous Q12H Fernand Prost, MD   3 mL at 05/19/24 0926   sodium phosphate  (FLEET) enema 1 enema  1 enema Rectal Daily PRN Marsa Edelman, DO   1 enema at 05/19/24 1809    PHYSICAL EXAMINATION:   Vitals:   05/19/24 0828 05/19/24 1730  BP: (!) 151/99 (!) 142/93  Pulse: 86 93  Resp: 18 17  Temp: 98.8 F (37.1 C) 98.8 F (37.1 C)  SpO2: 96% 97%   Filed Weights   05/17/24 1357 05/18/24 0500 05/19/24 0500  Weight: 137 lb (62.1 kg) 126 lb 8.7 oz (57.4 kg) 123 lb 14.4 oz (56.2 kg)    Physical Exam Vitals and nursing note reviewed.  HENT:     Head: Normocephalic and atraumatic.     Mouth/Throat:     Pharynx: Oropharynx is clear.  Eyes:     Extraocular Movements: Extraocular movements intact.     Pupils:  Pupils are equal, round, and reactive to light.  Cardiovascular:     Rate and Rhythm: Normal rate and regular rhythm.  Pulmonary:     Comments: Decreased breath sounds bilaterally.  Abdominal:     Palpations: Abdomen is soft.  Musculoskeletal:        General: Normal range of motion.     Cervical back: Normal range of motion.  Skin:    General: Skin is warm.  Neurological:     General: No focal deficit present.     Mental Status: She is alert and oriented to person, place, and time.  Psychiatric:        Behavior: Behavior normal.        Judgment: Judgment normal.     LABORATORY DATA:  I have reviewed the data as listed Lab Results  Component Value Date   WBC 11.6 (H) 05/19/2024   HGB 9.1 (L) 05/19/2024   HCT 27.0 (L) 05/19/2024   MCV 92.8 05/19/2024   PLT 357 05/19/2024   Recent Labs    10/15/23 1016 03/22/24 1037 04/27/24 1431 05/17/24 1401 05/18/24 0725 05/19/24 0705  NA 141 140 140 139 139 139  K 4.4 5.0 4.1 4.3 4.2 3.5  CL 102 103 103 99 104 104  CO2 21 18* 23 25 25 27   GLUCOSE 84 80 95 119* 80 98  BUN 18 14 14 19 17 13   CREATININE 1.91* 1.73* 1.58* 1.59* 1.34* 1.28*  CALCIUM  9.2 8.3* 8.2* 8.4* 7.8* 7.7*  GFRNONAA  --   --  34* 34* 41* 43*  PROT 6.8 6.3 6.8  --   --   --   ALBUMIN 4.1 3.7*  3.4*  --   --   --   AST 20 16 17   --   --   --   ALT 14 7 5   --   --   --   ALKPHOS 73 62 76  --   --   --   BILITOT 0.2 <0.2 <0.2  --   --   --     RADIOGRAPHIC STUDIES: I have personally reviewed the radiological images as listed and agreed with the findings in the report. CT Angio Chest Pulmonary Embolism (PE) W or WO Contrast Result Date: 05/17/2024 EXAM: CTA of the Chest with contrast for PE 05/17/2024 10:32:40 PM TECHNIQUE: CTA of the chest was performed without and with the administration of 75 mL of intravenous contrast (iohexol  (OMNIPAQUE ) 350 MG/ML injection 60 mL IOHEXOL  350 MG/ML SOLN). Multiplanar reformatted images are provided for review. MIP images are  provided for review. Automated exposure control, iterative reconstruction, and/or weight based adjustment of the mA/kV was utilized to reduce the radiation dose to as low as reasonably achievable. COMPARISON: CTA chest dated 01/09/2022. CLINICAL HISTORY: Pulmonary embolism (PE) suspected, high prob. Cough, SOB, abnormal CXR FINDINGS: PULMONARY ARTERIES: Pulmonary arteries are adequately opacified for evaluation. No pulmonary embolism. Main pulmonary artery is normal in caliber. MEDIASTINUM: The heart and pericardium demonstrate no acute abnormality. Mild thoracic aortic atherosclerosis. LYMPH NODES: Thoracic nodal metastases, including a 2.9 cm subcarinal node and 2.4 cm left perihilar node. LUNGS AND PLEURA: Masslike narrowing of the left mainstem bronchus (image 52). Associated peribronchovascular patchy opacity in the central left lower lobe, suggesting tumor, with post obstructive patchy opacity versus lymphangitic spread of tumor in the lingula and left lower lobe. Dominant 4.3 cm lobulated anterior left lower lobe mass (image 73). Additional dominant 3.8 cm right apical mass (image 18). Additional 7 mm satellite nodule/metastasis in the posterior left upper lobe (image 33). Small left pleural effusion. No pneumothorax. UPPER ABDOMEN: Limited images of the upper abdomen are unremarkable. SOFT TISSUES AND BONES: No acute bone or soft tissue abnormality. IMPRESSION: 1. No pulmonary embolism. 2. Masslike narrowing of the left mainstem bronchus. Two dominant masses and right lung apex and left upper lobe. 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 3. Left lower lobe/lingular postobstructive opacity vs lymphangitic spread of tumor. 4. Subcarinal and left perihilar nodal metastases. 5. Small left pleural effusion. Electronically signed by: Pinkie Pebbles MD 05/17/2024 10:43 PM EST RP Workstation: [id number]   DG Chest 2 View Result Date: 05/17/2024 EXAM: 2 VIEW(S) XRAY OF THE  CHEST 05/17/2024 02:30:00 PM COMPARISON: Chest CT 01/09/2022. CLINICAL HISTORY: Cough and SOB. FINDINGS: LUNGS AND PLEURA: There is a new round 3.5 cm right apical mass. Patchy airspace opacities and masslike opacity are seen in the inferior left upper lobe, also new from prior. No pleural effusion. No pneumothorax. HEART AND MEDIASTINUM: Aortic atherosclerosis. No acute abnormality of the cardiac and mediastinal silhouettes. BONES AND SOFT TISSUES: Degenerative changes in the left shoulder. IMPRESSION: 1. New 3.5 cm right apical pulmonary mass, suspicious for malignancy, and new patchy left upper lobe airspace opacity with a masslike component. Chest CT is recommended for further evaluation. 2. Small left pleural effusion without pneumothorax. Electronically signed by: Greig Pique MD 05/17/2024 03:24 PM EST RP Workstation: [id number]    RADIOLOGY Chest CT: Mass-like narrowing of the left main bronchus, bronchovascular opacity suggesting post-obstructive patch opacity versus lymphangitic spread of tumor, dominant mass 4.3 cm in left lower lobe, 3.8 cm in right upper lobe, subcarinal and hilar  lymphadenopathy   No problem-specific Assessment & Plan notes found for this encounter.  74 year old female patient with a history of stage III cervical cancer status post definitive chemoradiation, smoking currently quit-CKD anemia is currently admitted hospital for worsening difficulty breathing/cough-with wheezing with imaging findings concerning for malignancy in the lung on the CT scan.  Assessment and Plan Suspected malignancy of the lung-primary lung versus metastatic [prior history of stage III cervical cancer status post definitive chemoradiation] -Post discharge plan PET scan for further evaluation of pulmonary masses. - Once acute issues resolve plan bronchoscopy and biopsy with pulmonary for tissue diagnosis.  Patient has currently been evaluated by pulmonary, Dr.Aleskerov.  - Informed primary  oncologist of the plan. - Updated her daughter Rebecca Parker regarding the clinical status and plan.  # Prior history of stage I thyroid  malignancy status post thyroidectomy-clinically less likely reason for current lung masses.  However will check thyroglobulin levels.  Pneumonia Persistent cough and dyspnea attributed to pneumonia, likely secondary to underlying pulmonary pathology. - Continue previously initiated pneumonia treatment.  Chronic kidney disease Chronic kidney disease with mildly decreased renal function, potentially contributing to overall health status and anemia.  Anemia Anemia with hemoglobin of 9 g/dL, likely multifactorial due to chronic kidney disease and infection. - Monitor hemoglobin levels. - Assess for contributing factors including infection and chronic disease.  Thank you Dr. Marsa MD for allowing me to participate in the care of your pleasant patient. Please do not hesitate to contact me with questions or concerns in the interim.  I spoke to patient's daughter Rebecca Parker over the phone regarding my concerns of under malignancy based on imaging.  Patient has previously followed up with Dr. Babara in the clinic.  Informed Dr. Babara Cindy JONELLE Rennie, MD 05/19/2024 8:19 PM

## 2024-05-19 NOTE — Progress Notes (Signed)
 "    PULMONOLOGY         Date: 05/19/2024,   MRN# [id number] Rebecca Parker 02/18/1951     AdmissionWeight: 62.1 kg                 CurrentWeight: 56.2 kg  Referring provider: Dr Maree    CHIEF COMPLAINT:   Chronic cough with lung mass   HISTORY OF PRESENT ILLNESS   Rebecca Parker 74 year old female with a history of chronic anemia, B12 deficiency, cervical cancer, chest pain chronically, diastolic CHF, history of cocaine abuse, diabetes, thyroid  cancer, hepatitis, edentulous status, dyslipidemia, hypothyroidism, who came in for worsening dyspnea over the past 4 days prior to admission.  She denies having any sick contacts on arrival to the ED she had leukocytosis CBC with mild thrombocytopenia and renal dysfunction on BMP.  Her viral testing was negative.  She had CXR and CT chest.  I reviewed her CT chest independently, she appears to have consolidation/mass of the left lung with hilar and mediastinal adenopathy, as well as a well-circumscribed right upper lobe mass.   05/19/24- spoke with patient and her daughter Rebecca Parker regarding goals of care , she wants everything done.  We discussed lung biopsy patient wants to have bronchoscopy done. We made her NPO post midnight Thursday night for Friday procedure date probably in the afternoon.   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, cN1, cM0); Tx'd with Cisplatin  + EBRT   Chest pain, unspecified    CHF (congestive heart failure) (HCC)    Chronic diastolic heart failure, NYHA class 2 (HCC)    Cocaine use    none since Cancer dx (11/2021)   Diabetes mellitus without complication (HCC)    Edentulous    lost dentures   Grade I diastolic dysfunction    Hepatitis    Hip pain    HLD (hyperlipidemia)    Hyperplastic colon polyp    Hypertension    Hypothyroidism    Multinodular goiter     Port-A-Cath in place    Thyroid  cancer (HCC)    Thyroid  mass    a.) 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)   Tubular adenoma of colon      SURGICAL HISTORY   Past Surgical History:  Procedure Laterality Date   ABDOMINAL HYSTERECTOMY     CATARACT EXTRACTION W/PHACO Left 08/14/2023   Procedure: PHACOEMULSIFICATION, CATARACT, WITH IOL INSERTION  3.93  00:31.6;  Surgeon: Enola Feliciano Hugger, MD;  Location: [facility];  Service: Ophthalmology;  Laterality: Left;   CATARACT EXTRACTION W/PHACO Right 08/28/2023   Procedure: PHACOEMULSIFICATION, CATARACT, WITH IOL INSERTION 2.67 00:21.9;  Surgeon: Enola Feliciano Hugger, MD;  Location: [facility];  Service: Ophthalmology;  Laterality: Right;   COLONOSCOPY WITH PROPOFOL  N/A 11/24/2015   Procedure: COLONOSCOPY WITH PROPOFOL ;  Surgeon: Gladis RAYMOND Mariner, MD;  Location: [facility];  Service: Endoscopy;  Laterality: N/A;   COLONOSCOPY WITH PROPOFOL  N/A 03/18/2019   Procedure: COLONOSCOPY WITH PROPOFOL ;  Surgeon: Janalyn Keene NOVAK, MD;  Location: [facility];  Service: Endoscopy;  Laterality: N/A;   DILATION AND CURETTAGE OF UTERUS N/A 12/01/2020   Procedure: DILATATION AND CURETTAGE with cervical biopsies;  Surgeon: Schermerhorn, Debby PARAS, MD;  Location: [facility];  Service: Gynecology;  Laterality: N/A;   ESOPHAGOGASTRODUODENOSCOPY (EGD) WITH  PROPOFOL  N/A 03/18/2019   Procedure: ESOPHAGOGASTRODUODENOSCOPY (EGD) WITH PROPOFOL ;  Surgeon: Janalyn Keene NOVAK, MD;  Location: [facility];  Service: Endoscopy;  Laterality: N/A;   PORTA CATH INSERTION N/A 01/16/2021   Procedure: PORTA CATH INSERTION;  Surgeon: Jama Cordella MATSU, MD;  Location: [facility];  Service: Cardiovascular;  Laterality: N/A;   PORTA CATH REMOVAL N/A 10/28/2023   Procedure: PORTA CATH REMOVAL;  Surgeon: Jama Cordella MATSU, MD;  Location: [facility];  Service: Cardiovascular;  Laterality: N/A;    right ankle orif     THYROIDECTOMY, COMPLETION Bilateral 12/18/2021     FAMILY HISTORY   Family History  Problem Relation Age of Onset   Hypertension Mother    Breast cancer Maternal Aunt    Breast cancer Paternal Aunt    Hypertension Maternal Grandfather      SOCIAL HISTORY   Social History[1]   MEDICATIONS    Home Medication:    Current Medication: Current Medications[2]    ALLERGIES   Oxybutynin , Pregabalin , Ace inhibitors, and Cyclobenzaprine     REVIEW OF SYSTEMS    Review of Systems:  Gen:  Denies  fever, sweats, chills weigh loss  HEENT: Denies blurred vision, double vision, ear pain, eye pain, hearing loss, nose bleeds, sore throat Cardiac:  No dizziness, chest pain or heaviness, chest tightness,edema Resp:   reports dyspnea chronically  Gi: Denies swallowing difficulty, stomach pain, nausea or vomiting, diarrhea, constipation, bowel incontinence Gu:  Denies bladder incontinence, burning urine Ext:   Denies Joint pain, stiffness or swelling Skin: Denies  skin rash, easy bruising or bleeding or hives Endoc:  Denies polyuria, polydipsia , polyphagia or weight change Psych:   Denies depression, insomnia or hallucinations   Other:  All other systems negative   VS: BP (!) 151/99 (BP Location: Right Arm)   Pulse 86   Temp 98.8 F (37.1 C)   Resp 18   Ht 5' 1.5 (1.562 m)   Wt 56.2 kg   SpO2 96%   BMI 23.03 kg/m      PHYSICAL EXAM    GENERAL:NAD, no fevers, chills, no weakness no fatigue HEAD: Normocephalic, atraumatic.  EYES: Pupils equal, round, reactive to light. Extraocular muscles intact. No scleral icterus.  MOUTH: Moist mucosal membrane. Dentition intact. No abscess noted.  EAR, NOSE, THROAT: Clear without exudates. No external lesions.  NECK: Supple. No thyromegaly. No nodules. No JVD.  PULMONARY: decreased breath sounds with mild rhonchi worse at bases bilaterally.  CARDIOVASCULAR: S1 and S2. Regular rate and rhythm. No  murmurs, rubs, or gallops. No edema. Pedal pulses 2+ bilaterally.  GASTROINTESTINAL: Soft, nontender, nondistended. No masses. Positive bowel sounds. No hepatosplenomegaly.  MUSCULOSKELETAL: No swelling, clubbing, or edema. Range of motion full in all extremities.  NEUROLOGIC: Cranial nerves II through XII are intact. No gross focal neurological deficits. Sensation intact. Reflexes intact.  SKIN: No ulceration, lesions, rashes, or cyanosis. Skin warm and dry. Turgor intact.  PSYCHIATRIC: Mood, affect within normal limits. The patient is awake, alert and oriented x 3. Insight, judgment intact.       IMAGING   Narrative & Impression  EXAM: CTA of the Chest with contrast for PE 05/17/2024 10:32:40 PM   TECHNIQUE: CTA of the chest was performed without and with the administration of 75 mL of intravenous contrast (iohexol  (OMNIPAQUE ) 350 MG/ML injection 60 mL IOHEXOL  350 MG/ML SOLN). Multiplanar reformatted images are provided for review. MIP images are provided for review. Automated exposure control, iterative reconstruction, and/or weight based  adjustment of the mA/kV was utilized to reduce the radiation dose to as low as reasonably achievable.   COMPARISON: CTA chest dated 01/09/2022.   CLINICAL HISTORY: Pulmonary embolism (PE) suspected, high prob. Cough, SOB, abnormal CXR   FINDINGS:   PULMONARY ARTERIES: Pulmonary arteries are adequately opacified for evaluation. No pulmonary embolism. Main pulmonary artery is normal in caliber.   MEDIASTINUM: The heart and pericardium demonstrate no acute abnormality. Mild thoracic aortic atherosclerosis.   LYMPH NODES: Thoracic nodal metastases, including a 2.9 cm subcarinal node and 2.4 cm left perihilar node.   LUNGS AND PLEURA: Masslike narrowing of the left mainstem bronchus (image 52). Associated peribronchovascular patchy opacity in the central left lower lobe, suggesting tumor, with post obstructive patchy opacity versus  lymphangitic spread of tumor in the lingula and left lower lobe. Dominant 4.3 cm lobulated anterior left lower lobe mass (image 73). Additional dominant 3.8 cm right apical mass (image 18). Additional 7 mm satellite nodule/metastasis in the posterior left upper lobe (image 33). Small left pleural effusion. No pneumothorax.   UPPER ABDOMEN: Limited images of the upper abdomen are unremarkable.   SOFT TISSUES AND BONES: No acute bone or soft tissue abnormality.   IMPRESSION: 1. No pulmonary embolism. 2. Masslike narrowing of the left mainstem bronchus. Two dominant masses and right lung apex and left upper lobe. 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 3. Left lower lobe/lingular postobstructive opacity vs lymphangitic spread of tumor. 4. Subcarinal and left perihilar nodal metastases. 5. Small left pleural effusion.   Electronically signed by: Pinkie Pebbles MD 05/17/2024 10:43 PM EST RP Workstation: [id number]      ASSESSMENT/PLAN   Bilateral lung mass with hilar adenopathy  Highly concerning for metastatic cancer  - will need to discuss goals of care and consider tissue sampling if patient wishes to have full scope of care Masslike narrowing of the left mainstem bronchus (image 52). Associated peribronchovascular patchy opacity in the central left lower lobe, suggesting tumor, with post obstructive patchy opacity versus lymphangitic spread of tumor in the lingula and left lower lobe.  -will discuss bronchoscopy with patient        Thank you for allowing me to participate in the care of this patient.   Patient/Family are satisfied with care plan and all questions have been answered.    Provider disclosure: Patient with at least one acute or chronic illness or injury that poses a threat to life or bodily function and is being managed actively during this encounter.  All of the below services have been performed independently by  signing provider:  review of prior documentation from internal and or external health records.  Review of previous and current lab results.  Interview and comprehensive assessment during patient visit today. Review of current and previous chest radiographs/CT scans. Discussion of management and test interpretation with health care team and patient/family.   This document was prepared using Dragon voice recognition software and may include unintentional dictation errors.     Tien Spooner, M.D.  Division of Pulmonary & Critical Care Medicine              [1]  Social History Tobacco Use   Smoking status: Former    Current packs/day: 0.10    Average packs/day: 0.1 packs/day for 60.1 years (6.0 ttl pk-yrs)    Types: Cigarettes    Start date: 04/29/1964    Passive exposure: Never   Smokeless tobacco: Never  Vaping Use   Vaping status: Never Used  Substance Use Topics   Alcohol use: Not Currently   Drug use: Yes    Types: Crack cocaine    Comment: Patient is trying to quit  [2]  Current Facility-Administered Medications:    acetaminophen  (TYLENOL ) tablet 650 mg, 650 mg, Oral, Q6H PRN, 650 mg at 05/17/24 2341 **OR** acetaminophen  (TYLENOL ) suppository 650 mg, 650 mg, Rectal, Q6H PRN, Fernand Prost, MD   acetaminophen -codeine  120-12 MG/5ML solution 10 mL, 10 mL, Oral, Q6H PRN, Toniqua Melamed, MD, 10 mL at 05/18/24 1634   azithromycin  (ZITHROMAX ) tablet 500 mg, 500 mg, Oral, Daily, Fernand Prost, MD, 500 mg at 05/18/24 1546   cefTRIAXone  (ROCEPHIN ) 2 g in sodium chloride  0.9 % 100 mL IVPB, 2 g, Intravenous, Q24H, Fernand Prost, MD, Last Rate: 200 mL/hr at 05/18/24 1608, 2 g at 05/18/24 1608   guaiFENesin -dextromethorphan  (ROBITUSSIN DM) 100-10 MG/5ML syrup 5 mL, 5 mL, Oral, Q4H PRN, Jeremy Ditullio, MD, 5 mL at 05/19/24 0618   heparin  injection 5,000 Units, 5,000 Units, Subcutaneous, Q8H, Fernand Prost, MD, 5,000 Units at 05/19/24 9381   ondansetron  (ZOFRAN ) tablet 4 mg, 4 mg, Oral,  Q6H PRN **OR** ondansetron  (ZOFRAN ) injection 4 mg, 4 mg, Intravenous, Q6H PRN, Fernand Prost, MD   senna-docusate (Senokot-S) tablet 1 tablet, 1 tablet, Oral, QHS PRN, Fernand Prost, MD   sodium chloride  flush (NS) 0.9 % injection 3 mL, 3 mL, Intravenous, Q12H, Fernand Prost, MD, 3 mL at 05/18/24 2238  "

## 2024-05-19 NOTE — Hospital Course (Addendum)
 Hospital course / significant events:   HPI: Rebecca Parker is a 74 y.o. year old female with medical history of hypertension, hyperlipidemia, type 2 diabetes, history of cervical cancer and thyroid  cancer presenting to the ED with cough and shortness of breath.  Patient reports this has been going on for over a month but during the last 4 to 5 days it has become worse.  Denies any fevers or chills.  Per daughter at bedside she states patient has been weak and just feeling unwell.  She reports decreased appetite.  When discussed missing some of her appointments and not scheduling some of the recommended appointments, daughter stated she did not know about this.  Given this was worsening, she presented to the ED for further evaluation.   01/19: On arrival to the ED, VSS. CBC with significant leukocytosis at 27.8k, anemia at baseline.  There is mild thrombocytosis.  BMP with baseline renal function.  Magnesium  within normal limits.  Negative for COVID, flu, RSV.  Imaging showed new pulmonary mass that is 3.5 cm concerning for malignancy along with left upper lobe airspace opacity.  Given coughing, leukocytosis with opacity on imaging concerning for pneumonia,TR[facility] contacted for admission.  01/20: Pulmo & Onco c/s for lung mass, may need bronchoscopy if full scope tx is the goal. Palliative care c/s  01/21: per pulmonary, plan for bronchoscopy this Friday 01/23 01/22: await bronch tomorrow, elevated temp this morning continue abx pna  01/23: bronchoscopy today - biopsy mass, significant mucus as well. Discussion w/ pulm - no concerns for dc home. Discussion w/ onc - plan for follow up in about a week to go voer results and next steps.      Consultants:  Pulmonology Oncology  Palliative Care   Procedures/Surgeries: none      ASSESSMENT & PLAN:   Suspected malignancy of the lung-primary lung versus metastatic [prior history of stage III cervical cancer status post definitive chemoradiation]   Mass-like narrowing of the left mainstem bronchus - Associated peribronchovascular patchy opacity in the central left lower lobe, suggesting tumor, with post obstructive patchy opacity versus lymphangitic spread of tumor in the lingula and left lower lobe.  bronchoscopy today - biopsy mass, significant mucus as well.  Discussion w/ pulm - no concerns for dc home.  Discussion w/ onc - plan for follow up in about a week to go voer results and next steps. Likely for PET scan also   Pneumonia Finish course augmentin  Symptomatic tx - antitussives    Hypertension controlled for now Resume home losartan  at lower dose (25 mg bid instead of 50 mg bid)    Chronic HFpEF not in exacerbation Patient is euvolemic on exam.  Diuresis as needed    AKI on CKD 3a: improved and now at baseline.  Monitor periodic BMP   Anemia Anemia with hemoglobin of 9 g/dL, likely multifactorial due to chronic kidney disease and infection. Monitor CBC  Type 2 diabetes:  Holding home medicines.  SSI for now   Hx Cervical cancer/Thyroid  Cancer:  Follows with oncology at [facility].  She will need close follow-up with PCP, oncology along with endocrinology.    Hep C infection:  scheduled to see GI in 05/2024 to initiate treatment.  She has history of nonadherence to her medical care.  Discussed importance for close follow-up.   Weakness:  PT and OT consulted.       No concerns based on BMI: Body mass index is 23.03 kg/m.SABRA Significantly low or high BMI is associated  with higher medical risk.  Underweight - under 18  overweight - 25 to 29 obese - 30 or more Class 1 obesity: BMI of 30.0 to 34 Class 2 obesity: BMI of 35.0 to 39 Class 3 obesity: BMI of 40.0 to 49 Super Morbid Obesity: BMI 50-59 Super-super Morbid Obesity: BMI 60+ Healthy nutrition and physical activity advised as adjunct to other disease management and risk reduction treatments    DVT prophylaxis: heparin  IV fluids: no continuous IV fluids   Nutrition: cardiac diet Central lines / other devices: none  Code Status: FULL CODE ACP documentation reviewed:  none on file in [facility]  TOC needs: none at this time  Medical barriers to dispo: IV abx pneumonia, bronchoscopy tomorrow. Expected medical readiness for discharge pending bronchoscopy.

## 2024-05-19 NOTE — Evaluation (Signed)
 Physical Therapy Evaluation Patient Details Name: Rebecca Parker MRN: [id number] DOB: 01-Sep-1950 Today's Date: 05/19/2024  History of Present Illness  Rebecca Parker is a 74 y.o. year old female with medical history of hypertension, hyperlipidemia, type 2 diabetes, history of cervical cancer and thyroid  cancer presenting to the ED with cough and shortness of breath. CT reaveals masslike narrowing of the left mainstem bronchus. Two dominant masses and right lung apex and left upper lobe. These findings are compatible with  multifocal primary bronchogenic carcinoma.   Clinical Impression  Patient received in bed, she is trying to call her daughter. Assisted patient with calling daughter. She performed bed mobility independently. She was able to stand with supervision and ambulated in room 15' without AD. Patient mainly limited by coughing throughout mobility. She will continue to benefit from skilled PT while here to improve activity tolerance.           If plan is discharge home, recommend the following: A little help with walking and/or transfers   Can travel by private vehicle    yes    Equipment Recommendations None recommended by PT  Recommendations for Other Services       Functional Status Assessment Patient has had a recent decline in their functional status and demonstrates the ability to make significant improvements in function in a reasonable and predictable amount of time.     Precautions / Restrictions Precautions Precaution/Restrictions Comments: mod fall Restrictions Weight Bearing Restrictions Per Provider Order: No      Mobility  Bed Mobility Overal bed mobility: Independent                  Transfers Overall transfer level: Independent Equipment used: None                    Ambulation/Gait Ambulation/Gait assistance: Supervision Gait Distance (Feet): 15 Feet Assistive device: None Gait Pattern/deviations: Step-through pattern, Decreased  stride length, Trunk flexed Gait velocity: WFL     General Gait Details: with mobility, patient having increased coughing. Difficulty conversing due to coughing. Ambulated around room with supervision  Stairs            Wheelchair Mobility     Tilt Bed    Modified Rankin (Stroke Patients Only)       Balance Overall balance assessment: Mild deficits observed, not formally tested                                           Pertinent Vitals/Pain Pain Assessment Pain Assessment: No/denies pain    Home Living Family/patient expects to be discharged to:: Private residence Living Arrangements: Children               Home Equipment: [place] - quad      Prior Function Prior Level of Function : Independent/Modified Independent                     Extremity/Trunk Assessment   Upper Extremity Assessment Upper Extremity Assessment: Overall WFL for tasks assessed    Lower Extremity Assessment Lower Extremity Assessment: Generalized weakness    Cervical / Trunk Assessment Cervical / Trunk Assessment: Normal  Communication   Communication Communication: No apparent difficulties    Cognition Arousal: Alert Behavior During Therapy: WFL for tasks assessed/performed   PT - Cognitive impairments: No apparent impairments  Following commands: Intact       Cueing Cueing Techniques: Verbal cues     General Comments      Exercises     Assessment/Plan    PT Assessment Patient needs continued PT services  PT Problem List Decreased activity tolerance;Decreased mobility       PT Treatment Interventions Gait training;Stair training;Functional mobility training;Therapeutic activities;Therapeutic exercise;Patient/family education    PT Goals (Current goals can be found in the Care Plan section)  Acute Rehab PT Goals Patient Stated Goal: stop coughing PT Goal Formulation: With patient Time For Goal  Achievement: 06/02/24 Potential to Achieve Goals: Fair    Frequency Min 2X/week     Co-evaluation               AM-PAC PT 6 Clicks Mobility  Outcome Measure Help needed turning from your back to your side while in a flat bed without using bedrails?: None Help needed moving from lying on your back to sitting on the side of a flat bed without using bedrails?: None Help needed moving to and from a bed to a chair (including a wheelchair)?: A Little Help needed standing up from a chair using your arms (e.g., wheelchair or bedside chair)?: None Help needed to walk in hospital room?: A Little Help needed climbing 3-5 steps with a railing? : A Little 6 Click Score: 21    End of Session   Activity Tolerance: Other (comment) (limited by coughing) Patient left: in chair;with chair alarm set Nurse Communication: Mobility status PT Visit Diagnosis: Other abnormalities of gait and mobility (R26.89)    Time: 9091-9079 PT Time Calculation (min) (ACUTE ONLY): 12 min   Charges:   PT Evaluation $PT Eval Low Complexity: 1 Low   PT General Charges $$ ACUTE PT VISIT: 1 Visit         Carleta Woodrow, PT, GCS 05/19/24,10:24 AM

## 2024-05-20 DIAGNOSIS — A419 Sepsis, unspecified organism: Secondary | ICD-10-CM | POA: Diagnosis not present

## 2024-05-20 DIAGNOSIS — J189 Pneumonia, unspecified organism: Secondary | ICD-10-CM | POA: Diagnosis not present

## 2024-05-20 LAB — GLUCOSE, CAPILLARY: Glucose-Capillary: 93 mg/dL (ref 70–99)

## 2024-05-20 MED ORDER — HYDROCODONE BIT-HOMATROP MBR 5-1.5 MG/5ML PO SOLN
5.0000 mL | ORAL | Status: DC | PRN
  Administered 2024-05-20: 5 mL via ORAL
  Filled 2024-05-20: qty 5

## 2024-05-20 NOTE — Care Management Important Message (Signed)
 Important Message  Patient Details  Name: Rebecca Parker MRN: [id number] Date of Birth: 03-19-1951   Important Message Given:  Yes - Medicare IM     Circe Chilton W, CMA 05/20/2024, 12:49 PM

## 2024-05-20 NOTE — Plan of Care (Signed)
   Problem: Education: Goal: Knowledge of General Education information will improve Description Including pain rating scale, medication(s)/side effects and non-pharmacologic comfort measures Outcome: Progressing

## 2024-05-20 NOTE — Progress Notes (Signed)
" ° ° °  PROCEDURAL EXPEDITER PROGRESS NOTE  Patient Name: Rebecca Parker  DOB:1950-10-16 Date of Admission: 05/17/2024  Date of Assessment:05/20/24   -------------------------------------------------------------------------------------------------------------------   Brief clinical summary: 74 yr old female is scheduled for  bronchoscopy bilateral. For 05/21/2024  Orders in place:  Yes   Communication with surgical team if no orders: n/a  Labs, test, and orders reviewed: yes  Requires surgical clearance:  No  What type of clearance: n/a  Clearance received: n/a  Barriers noted:n/a   Intervention provided by [facility] team: n/a  Barrier resolved:  not applicable   -------------------------------------------------------------------------------------------------------------------  [employer], Ronal DELENA Bald Please contact us  directly via secure chat (search for [facility]) or by calling us  at [phone] [facility]).  "

## 2024-05-20 NOTE — Progress Notes (Signed)
 SPIRITUAL CARE AND COUNSELING CONSULT NOTE   VISIT SUMMARY - Chaplain encouragement patient and family.   SPIRITUAL ENCOUNTER                                                                                                                                                                      Type of Visit: Follow up Care provided to:: Patient Reason for visit: Routine spiritual support   SPIRITUAL FRAMEWORK  Community/Connection: Family   GOALS       INTERVENTIONS   Spiritual Care Interventions Made: Compassionate presence, Encouragement    INTERVENTION OUTCOMES   Outcomes: Connection to spiritual care  SPIRITUAL CARE PLAN        If immediate needs arise, please contact [facility] 24 hour on call [phone]   Rebecca Parker  05/20/2024 3:40 PM

## 2024-05-20 NOTE — Progress Notes (Signed)
 "    PULMONOLOGY         Date: 05/20/2024,   MRN# [id number] Rebecca Parker [birth year]     AdmissionWeight: 62.1 kg                 CurrentWeight: 57.7 kg  Referring provider: Dr Maree    CHIEF COMPLAINT:   Chronic cough with lung mass   HISTORY OF PRESENT ILLNESS   Ms. pleasant 74 year old female with a history of chronic anemia, B12 deficiency, cervical cancer, chest pain chronically, diastolic CHF, history of cocaine abuse, diabetes, thyroid  cancer, hepatitis, edentulous status, dyslipidemia, hypothyroidism, who came in for worsening dyspnea over the past 4 days prior to admission.  She denies having any sick contacts on arrival to the ED she had leukocytosis CBC with mild thrombocytopenia and renal dysfunction on BMP.  Her viral testing was negative.  She had CXR and CT chest.  I reviewed her CT chest independently, she appears to have consolidation/mass of the left lung with hilar and mediastinal adenopathy, as well as a well-circumscribed right upper lobe mass.   05/19/24- spoke with patient and her daughter Rebecca Parker regarding goals of care , she wants everything done.  We discussed lung biopsy patient wants to have bronchoscopy done. We made her NPO post midnight Thursday night for Friday procedure date probably in the afternoon.   05/20/24- patient for bronchoscopy in AM.  NPO tonight for procedure in AM. S/p anesthesia evaluation.   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, cN1, cM0); Tx'd with Cisplatin  + EBRT   Chest pain, unspecified    CHF (congestive heart failure) (HCC)    Chronic diastolic heart failure, NYHA class 2 (HCC)    Cocaine use    none since Cancer dx (11/2021)   Diabetes mellitus without complication (HCC)    Edentulous    lost dentures   Grade I diastolic dysfunction    Hepatitis    Hip pain    HLD  (hyperlipidemia)    Hyperplastic colon polyp    Hypertension    Hypothyroidism    Multinodular goiter    Port-A-Cath in place    Thyroid  cancer (HCC)    Thyroid  mass    a.) 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;  Service: Ophthalmology;  Laterality: Left;   CATARACT EXTRACTION W/PHACO Right 08/28/2023   Procedure: PHACOEMULSIFICATION, CATARACT, WITH IOL INSERTION 2.67 00:21.9;  Surgeon: Enola Feliciano Hugger, MD;  Location: [facility];  Service: Ophthalmology;  Laterality: Right;   COLONOSCOPY WITH PROPOFOL  N/A 11/24/2015   Procedure: COLONOSCOPY WITH PROPOFOL ;  Surgeon: Gladis RAYMOND Mariner, MD;  Location: [facility];  Service: Endoscopy;  Laterality: N/A;   COLONOSCOPY WITH PROPOFOL  N/A 03/18/2019   Procedure: COLONOSCOPY WITH PROPOFOL ;  Surgeon: Janalyn Keene NOVAK, MD;  Location: [facility] ENDOSCOPY;  Service: Endoscopy;  Laterality: N/A;   DILATION AND CURETTAGE OF UTERUS N/A 12/01/2020   Procedure: DILATATION AND CURETTAGE with cervical biopsies;  Surgeon: Schermerhorn,  Debby PARAS, MD;  Location: [facility] OR[facility];  Service: Gynecology;  Laterality: N/A;   ESOPHAGOGASTRODUODENOSCOPY (EGD) WITH PROPOFOL  N/A 03/18/2019   Procedure: ESOPHAGOGASTRODUODENOSCOPY (EGD) WITH PROPOFOL ;  Surgeon: Janalyn Keene NOVAK, MD;  Location: [facility] ENDOSCOPY;  Service: Endoscopy;  Laterality: N/A;   PORTA CATH INSERTION N/A 01/16/2021   Procedure: PORTA CATH INSERTION;  Surgeon: Jama Cordella MATSU, MD;  Location: [facility];  Service: Cardiovascular;  Laterality: N/A;   PORTA CATH REMOVAL N/A 10/28/2023   Procedure: PORTA CATH REMOVAL;   Surgeon: Jama Cordella MATSU, MD;  Location: [facility] CV [facility];  Service: Cardiovascular;  Laterality: N/A;   right ankle orif     THYROIDECTOMY, COMPLETION Bilateral 12/18/2021     FAMILY HISTORY   Family History  Problem Relation Age of Onset   Hypertension Mother    Breast cancer Maternal Aunt    Breast cancer Paternal Aunt    Hypertension Maternal Grandfather      SOCIAL HISTORY   Social History[1]   MEDICATIONS    Home Medication:    Current Medication: Current Medications[2]    ALLERGIES   Oxybutynin , Pregabalin , Ace inhibitors, and Cyclobenzaprine     REVIEW OF SYSTEMS    Review of Systems:  Gen:  Denies  fever, sweats, chills weigh loss  HEENT: Denies blurred vision, double vision, ear pain, eye pain, hearing loss, nose bleeds, sore throat Cardiac:  No dizziness, chest pain or heaviness, chest tightness,edema Resp:   reports dyspnea chronically  Gi: Denies swallowing difficulty, stomach pain, nausea or vomiting, diarrhea, constipation, bowel incontinence Gu:  Denies bladder incontinence, burning urine Ext:   Denies Joint pain, stiffness or swelling Skin: Denies  skin rash, easy bruising or bleeding or hives Endoc:  Denies polyuria, polydipsia , polyphagia or weight change Psych:   Denies depression, insomnia or hallucinations   Other:  All other systems negative   VS: BP (!) 147/90 (BP Location: Right Arm)   Pulse 89   Temp (!) 100.9 F (38.3 C)   Resp 16   Ht 5' 1.5 (1.562 m)   Wt 57.7 kg   SpO2 95%   BMI 23.65 kg/m      PHYSICAL EXAM    GENERAL:NAD, no fevers, chills, no weakness no fatigue HEAD: Normocephalic, atraumatic.  EYES: Pupils equal, round, reactive to light. Extraocular muscles intact. No scleral icterus.  MOUTH: Moist mucosal membrane. Dentition intact. No abscess noted.  EAR, NOSE, THROAT: Clear without exudates. No external lesions.  NECK: Supple. No thyromegaly. No nodules. No JVD.  PULMONARY: decreased breath  sounds with mild rhonchi worse at bases bilaterally.  CARDIOVASCULAR: S1 and S2. Regular rate and rhythm. No murmurs, rubs, or gallops. No edema. Pedal pulses 2+ bilaterally.  GASTROINTESTINAL: Soft, nontender, nondistended. No masses. Positive bowel sounds. No hepatosplenomegaly.  MUSCULOSKELETAL: No swelling, clubbing, or edema. Range of motion full in all extremities.  NEUROLOGIC: Cranial nerves II through XII are intact. No gross focal neurological deficits. Sensation intact. Reflexes intact.  SKIN: No ulceration, lesions, rashes, or cyanosis. Skin warm and dry. Turgor intact.  PSYCHIATRIC: Mood, affect within normal limits. The patient is awake, alert and oriented x 3. Insight, judgment intact.       IMAGING   Narrative & Impression  EXAM: CTA of the Chest with contrast for PE 05/17/2024 10:32:40 PM   TECHNIQUE: CTA of the chest was performed without and with the administration of 75 mL of intravenous contrast (iohexol  (OMNIPAQUE ) 350 MG/ML injection 60 mL IOHEXOL  350 MG/ML SOLN). Multiplanar reformatted  images are provided for review. MIP images are provided for review. Automated exposure control, iterative reconstruction, and/or weight based adjustment of the mA/kV was utilized to reduce the radiation dose to as low as reasonably achievable.   COMPARISON: CTA chest dated 01/09/2022.   CLINICAL HISTORY: Pulmonary embolism (PE) suspected, high prob. Cough, SOB, abnormal CXR   FINDINGS:   PULMONARY ARTERIES: Pulmonary arteries are adequately opacified for evaluation. No pulmonary embolism. Main pulmonary artery is normal in caliber.   MEDIASTINUM: The heart and pericardium demonstrate no acute abnormality. Mild thoracic aortic atherosclerosis.   LYMPH NODES: Thoracic nodal metastases, including a 2.9 cm subcarinal node and 2.4 cm left perihilar node.   LUNGS AND PLEURA: Masslike narrowing of the left mainstem bronchus (image 52). Associated peribronchovascular  patchy opacity in the central left lower lobe, suggesting tumor, with post obstructive patchy opacity versus lymphangitic spread of tumor in the lingula and left lower lobe. Dominant 4.3 cm lobulated anterior left lower lobe mass (image 73). Additional dominant 3.8 cm right apical mass (image 18). Additional 7 mm satellite nodule/metastasis in the posterior left upper lobe (image 33). Small left pleural effusion. No pneumothorax.   UPPER ABDOMEN: Limited images of the upper abdomen are unremarkable.   SOFT TISSUES AND BONES: No acute bone or soft tissue abnormality.   IMPRESSION: 1. No pulmonary embolism. 2. Masslike narrowing of the left mainstem bronchus. Two dominant masses and right lung apex and left upper lobe. 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 3. Left lower lobe/lingular postobstructive opacity vs lymphangitic spread of tumor. 4. Subcarinal and left perihilar nodal metastases. 5. Small left pleural effusion.   Electronically signed by: Pinkie Pebbles MD 05/17/2024 10:43 PM EST RP Workstation: [id number]      ASSESSMENT/PLAN   Bilateral lung mass with hilar adenopathy  Highly concerning for metastatic cancer  - will need to discuss goals of care and consider tissue sampling if patient wishes to have full scope of care Masslike narrowing of the left mainstem bronchus (image 52). Associated peribronchovascular patchy opacity in the central left lower lobe, suggesting tumor, with post obstructive patchy opacity versus lymphangitic spread of tumor in the lingula and left lower lobe.  -will discuss bronchoscopy with patient        Thank you for allowing me to participate in the care of this patient.   Patient/Family are satisfied with care plan and all questions have been answered.    Provider disclosure: Patient with at least one acute or chronic illness or injury that poses a threat to life or bodily function and is  being managed actively during this encounter.  All of the below services have been performed independently by signing provider:  review of prior documentation from internal and or external health records.  Review of previous and current lab results.  Interview and comprehensive assessment during patient visit today. Review of current and previous chest radiographs/CT scans. Discussion of management and test interpretation with health care team and patient/family.   This document was prepared using Dragon voice recognition software and may include unintentional dictation errors.     Taylorann Tkach, M.D.  Division of Pulmonary & Critical Care Medicine               [1]  Social History Tobacco Use   Smoking status: Former    Current packs/day: 0.10    Average packs/day: 0.1 packs/day for 60.1 years (6.0 ttl pk-yrs)    Types: Cigarettes    Start date: 04/29/1964  Passive exposure: Never   Smokeless tobacco: Never  Vaping Use   Vaping status: Never Used  Substance Use Topics   Alcohol use: Not Currently   Drug use: Yes    Types: Crack cocaine    Comment: Patient is trying to quit  [2]  Current Facility-Administered Medications:    acetaminophen  (TYLENOL ) tablet 650 mg, 650 mg, Oral, Q6H PRN, 650 mg at 05/19/24 1541 **OR** acetaminophen  (TYLENOL ) suppository 650 mg, 650 mg, Rectal, Q6H PRN, Fernand Prost, MD   acetaminophen -codeine  120-12 MG/5ML solution 10 mL, 10 mL, Oral, Q6H PRN, Carlise Stofer, MD, 10 mL at 05/18/24 1634   azithromycin  (ZITHROMAX ) tablet 500 mg, 500 mg, Oral, Daily, Fernand Prost, MD, 500 mg at 05/20/24 9162   bisacodyl  (DULCOLAX) suppository 10 mg, 10 mg, Rectal, Daily PRN, Marsa Edelman, DO, 10 mg at 05/19/24 1541   cefTRIAXone  (ROCEPHIN ) 2 g in sodium chloride  0.9 % 100 mL IVPB, 2 g, Intravenous, Q24H, Fernand Prost, MD, Last Rate: 200 mL/hr at 05/19/24 1546, 2 g at 05/19/24 1546   guaiFENesin -dextromethorphan  (ROBITUSSIN DM) 100-10 MG/5ML syrup 5  mL, 5 mL, Oral, Q4H PRN, Requan Hardge, MD, 5 mL at 05/20/24 0314   heparin  injection 5,000 Units, 5,000 Units, Subcutaneous, Q8H, Fernand Prost, MD, 5,000 Units at 05/20/24 1438   levothyroxine  (SYNTHROID ) tablet 112 mcg, 112 mcg, Oral, Q0600, Alexander, Natalie, DO, 112 mcg at 05/20/24 9380   losartan  (COZAAR ) tablet 25 mg, 25 mg, Oral, BID, Alexander, Natalie, DO, 25 mg at 05/20/24 9162   mirtazapine  (REMERON ) tablet 15 mg, 15 mg, Oral, QHS, Alexander, Natalie, DO, 15 mg at 05/19/24 2205   ondansetron  (ZOFRAN ) tablet 4 mg, 4 mg, Oral, Q6H PRN **OR** ondansetron  (ZOFRAN ) injection 4 mg, 4 mg, Intravenous, Q6H PRN, Khan, Ghalib, MD   polyethylene glycol (MIRALAX  / GLYCOLAX ) packet 17 g, 17 g, Oral, Daily, Marsa Edelman, DO, 17 g at 05/20/24 9162   senna-docusate (Senokot-S) tablet 1 tablet, 1 tablet, Oral, QHS PRN, Fernand Prost, MD   simvastatin  (ZOCOR ) tablet 20 mg, 20 mg, Oral, Daily, Alexander, Natalie, DO, 20 mg at 05/20/24 9162   sodium chloride  flush (NS) 0.9 % injection 3 mL, 3 mL, Intravenous, Q12H, Fernand Prost, MD, 3 mL at 05/20/24 9161   sodium phosphate  (FLEET) enema 1 enema, 1 enema, Rectal, Daily PRN, Marsa Edelman, DO, 1 enema at 05/19/24 1809  "

## 2024-05-20 NOTE — Anesthesia Preprocedure Evaluation (Signed)
 "                                  Anesthesia Evaluation  Patient identified by MRN, date of birth, ID band Patient awake    Reviewed: Allergy & Precautions, H&P , NPO status , Patient's Chart, lab work & pertinent test results  History of Anesthesia Complications Negative for: history of anesthetic complications  Airway Mallampati: I  TM Distance: >3 FB Neck ROM: full    Dental  (+) Dental Advidsory Given, Edentulous Upper, Edentulous Lower   Pulmonary shortness of breath, neg sleep apnea, neg COPD, neg recent URI, former smoker   Pulmonary exam normal breath sounds clear to auscultation       Cardiovascular hypertension, (-) angina +CHF  (-) Past MI and (-) Cardiac Stents Normal cardiovascular exam(-) dysrhythmias (-) Valvular Problems/Murmurs Rhythm:Regular Rate:Normal  chest pain chronically, diastolic CHF   Neuro/Psych  PSYCHIATRIC DISORDERS      negative neurological ROS  negative psych ROS   GI/Hepatic negative GI ROS,,,(+) Hepatitis -, C  Endo/Other  negative endocrine ROSdiabetes, Type 2Hypothyroidism    Renal/GU Renal disease (stage 3b CKD)  negative genitourinary   Musculoskeletal negative musculoskeletal ROS (+)    Abdominal   Peds negative pediatric ROS (+)  Hematology negative hematology ROS (+) Blood dyscrasia, anemia B12 deficiency   Anesthesia Other Findings H/o  history of cervical cancer and thyroid  cancer. Here with SOB and imagine concerning for lung cancer  CT angio chest 05/17/2024: IMPRESSION: 1. No pulmonary embolism. 2. Masslike narrowing of the left mainstem bronchus. Two dominant masses and right lung apex and left upper lobe. 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 3. Left lower lobe/lingular postobstructive opacity vs lymphangitic spread of tumor. 4. Subcarinal and left perihilar nodal metastases. 5. Small left pleural effusion.   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               (cT2b, cN1, cM0); Tx'd with Cisplatin  + EBRT No date: Chest pain, unspecified No date: CHF (congestive heart failure) (HCC) No date: Chronic diastolic heart failure, NYHA class 2 (HCC) No date: Cocaine use     Comment:  none since Cancer dx (11/2021) No date: Diabetes mellitus without complication (HCC) No date: Edentulous     Comment:  lost dentures No date: Grade I diastolic dysfunction No date: Hepatitis No date: Hip pain No date: HLD (hyperlipidemia) No date: Hyperplastic colon polyp No date: Hypertension No date: Hypothyroidism No date: Multinodular goiter No date: Port-A-Cath in place No date: Thyroid  cancer (HCC) No date: Thyroid  mass     Comment:  a.) FNA Bx 03/05/2021 (+) for suspected follicular               neoplasm (Bathesda category IV) No date: Tubular adenoma of colon  Past Surgical History: No date: ABDOMINAL HYSTERECTOMY 08/14/2023: CATARACT EXTRACTION W/PHACO; Left     Comment:  Procedure: PHACOEMULSIFICATION, CATARACT, WITH IOL               INSERTION  3.93  00:31.6;  Surgeon: Enola Feliciano Hugger, MD;  Location: [facility];  Service:  Ophthalmology;  Laterality: Left; 08/28/2023: CATARACT EXTRACTION W/PHACO; Right     Comment:  Procedure: PHACOEMULSIFICATION, CATARACT, WITH IOL               INSERTION 2.67 00:21.9;  Surgeon: Enola Feliciano Hugger,              MD;  Location: [facility];  Service:               Ophthalmology;  Laterality: Right; 11/24/2015: COLONOSCOPY WITH PROPOFOL ; N/A     Comment:  Procedure: COLONOSCOPY WITH PROPOFOL ;  Surgeon: Gladis RAYMOND Mariner, MD;  Location: [facility] END[facility];  Service:               Endoscopy;  Laterality: N/A; 03/18/2019: COLONOSCOPY WITH PROPOFOL ; N/A     Comment:  Procedure: COLONOSCOPY WITH PROPOFOL ;   Surgeon:               Janalyn Keene NOVAK, MD;  Location: [facility] ENDOSCOPY;                Service: Endoscopy;  Laterality: N/A; 12/01/2020: DILATION AND CURETTAGE OF UTERUS; N/A     Comment:  Procedure: DILATATION AND CURETTAGE with cervical               biopsies;  Surgeon: Schermerhorn, Debby PARAS, MD;                Location: [facility] ORS;  Service: Gynecology;  Laterality:               N/A; 03/18/2019: ESOPHAGOGASTRODUODENOSCOPY (EGD) WITH PROPOFOL ; N/A     Comment:  Procedure: ESOPHAGOGASTRODUODENOSCOPY (EGD) WITH               PROPOFOL ;  Surgeon: Janalyn Keene NOVAK, MD;  Location:               [facility] ENDOSCOPY;  Service: Endoscopy;  Laterality: N/A; 01/16/2021: PORTA CATH INSERTION; N/A     Comment:  Procedure: PORTA CATH INSERTION;  Surgeon: Jama Cordella MATSU, MD;  Location: [facility] INV[facility] CV LAB;  Service:              Cardiovascular;  Laterality: N/A; 10/28/2023: PORTA CATH REMOVAL; N/A     Comment:  Procedure: PORTA CATH REMOVAL;  Surgeon: Jama Cordella MATSU, MD;  Location: [facility] INV[facility] CV LAB;  Service:              Cardiovascular;  Laterality: N/A; No date: right ankle orif 12/18/2021: THYROIDECTOMY, COMPLETION; Bilateral  BMI    Body Mass Index: 23.65 kg/m      Reproductive/Obstetrics negative OB ROS                              Anesthesia Physical Anesthesia Plan  ASA: 3  Anesthesia Plan: General   Post-op Pain Management: Minimal or no pain anticipated   Induction: Intravenous  PONV Risk Score and Plan: 2 and Ondansetron , Dexamethasone  and Treatment may vary due to age or medical condition  Airway Management Planned: Oral ETT  Additional Equipment:   Intra-op Plan:   Post-operative Plan: Extubation in OR  Informed Consent: I have reviewed the patients History and Physical, chart, labs and discussed the procedure including the risks,  benefits and alternatives for the proposed anesthesia with the patient  or authorized representative who has indicated his/her understanding and acceptance.     Dental Advisory Given  Plan Discussed with: CRNA and Surgeon  Anesthesia Plan Comments: (Patient consented for risks of anesthesia including but not limited to:  - adverse reactions to medications - damage to eyes, teeth, lips or other oral mucosa - nerve damage due to positioning  - sore throat or hoarseness - Damage to heart, brain, nerves, lungs, other parts of body or loss of life  Patient voiced understanding and assent.)         Anesthesia Quick Evaluation  "

## 2024-05-20 NOTE — Progress Notes (Signed)
 " PROGRESS NOTE    Rebecca Parker   FMW:[id number] DOB: 1950/10/13  DOA: 05/17/2024 Date of Service: 05/20/24 which is hospital day 2  PCP: Melvin Pao, NP    Hospital course / significant events:   HPI: Rebecca Parker is a 74 y.o. year old female with medical history of hypertension, hyperlipidemia, type 2 diabetes, history of cervical cancer and thyroid  cancer presenting to the ED with cough and shortness of breath.  Patient reports this has been going on for over a month but during the last 4 to 5 days it has become worse.  Denies any fevers or chills.  Per daughter at bedside she states patient has been weak and just feeling unwell.  She reports decreased appetite.  When discussed missing some of her appointments and not scheduling some of the recommended appointments, daughter stated she did not know about this.  Given this was worsening, she presented to the ED for further evaluation.   01/19: On arrival to the ED, VSS. CBC with significant leukocytosis at 27.8k, anemia at baseline.  There is mild thrombocytosis.  BMP with baseline renal function.  Magnesium  within normal limits.  Negative for COVID, flu, RSV.  Imaging showed new pulmonary mass that is 3.5 cm concerning for malignancy along with left upper lobe airspace opacity.  Given coughing, leukocytosis with opacity on imaging concerning for pneumonia,TR[facility] contacted for admission.  01/20: Pulmo & Onco c/s for lung mass, may need bronchoscopy if full scope tx is the goal. Palliative care c/s  01/21: per pulmonary, plan for bronchoscopy this Friday 01/23 01/22: await bronch tomorrow, elevated temp this morning continue abx pna      Consultants:  Pulmonology Oncology  Palliative Care   Procedures/Surgeries: none      ASSESSMENT & PLAN:   Suspected malignancy of the lung-primary lung versus metastatic [prior history of stage III cervical cancer status post definitive chemoradiation]  Mass-like narrowing of the left  mainstem bronchus - Associated peribronchovascular patchy opacity in the central left lower lobe, suggesting tumor, with post obstructive patchy opacity versus lymphangitic spread of tumor in the lingula and left lower lobe.  Bronchoscopy w/ planned for this Friday 01/23 - holding aspirin for now  Post discharge, plan PET scan  Onc team here has informed primary oncologist of the plan.   Pneumonia Continue Ceftriaxone  and Azithromycin     Hypertension controlled for now Resume home losartan  at lower dose (25 mg bid instead of 50 mg bid)    Chronic HFpEF not in exacerbation Patient is euvolemic on exam.  Diuresis as needed    AKI on CKD 3a: improved and now at baseline.  Monitor periodic BMP   Anemia Anemia with hemoglobin of 9 g/dL, likely multifactorial due to chronic kidney disease and infection. Monitor CBC  Type 2 diabetes:  Holding home medicines.  SSI for now   Hx Cervical cancer/Thyroid  Cancer:  Follows with oncology at [facility].  She will need close follow-up with PCP, oncology along with endocrinology.    Hep C infection:  scheduled to see GI in 05/2024 to initiate treatment.  She has history of nonadherence to her medical care.  Discussed importance for close follow-up.   Weakness:  PT and OT consulted.      No concerns based on BMI: Body mass index is 23.03 kg/m.SABRA Significantly low or high BMI is associated with higher medical risk.  Underweight - under 18  overweight - 25 to 29 obese - 30 or more Class 1 obesity: BMI  of 30.0 to 34 Class 2 obesity: BMI of 35.0 to 39 Class 3 obesity: BMI of 40.0 to 49 Super Morbid Obesity: BMI 50-59 Super-super Morbid Obesity: BMI 60+ Healthy nutrition and physical activity advised as adjunct to other disease management and risk reduction treatments    DVT prophylaxis: heparin  IV fluids: no continuous IV fluids  Nutrition: cardiac diet Central lines / other devices: none  Code Status: FULL CODE ACP documentation  reviewed:  none on file in [facility]  TOC needs: none at this time  Medical barriers to dispo: IV abx pneumonia, bronchoscopy tomorrow. Expected medical readiness for discharge pending bronchoscopy.              Subjective / Brief ROS:  Patient reports concern no one is helping her with the phones or remote, I demonstrated how to use these and pt voices appreciation.  Denies CP/SOB.  Pain controlled.  Denies new weakness.  Tolerating diet.  Reports no concerns w/ urination/defecation.   Family Communication: none at this time will call with any updates / as needed     Objective Findings:  Vitals:   05/19/24 2053 05/20/24 0326 05/20/24 0327 05/20/24 0807  BP: (!) 134/98 (!) 155/92  (!) 147/90  Pulse: 98 99  89  Resp: 18 16  16   Temp: 98.3 F (36.8 C) 98.8 F (37.1 C)  (!) 100.9 F (38.3 C)  TempSrc:      SpO2: 98% 94%  95%  Weight:   57.7 kg   Height:        Intake/Output Summary (Last 24 hours) at 05/20/2024 1400 Last data filed at 05/20/2024 0900 Gross per 24 hour  Intake 120 ml  Output --  Net 120 ml   Filed Weights   05/18/24 0500 05/19/24 0500 05/20/24 0327  Weight: 57.4 kg 56.2 kg 57.7 kg    Examination:  Physical Exam Constitutional:      General: She is not in acute distress. Cardiovascular:     Rate and Rhythm: Normal rate and regular rhythm.  Pulmonary:     Effort: Pulmonary effort is normal.     Breath sounds: Decreased breath sounds present.  Musculoskeletal:     Right lower leg: No edema.     Left lower leg: No edema.  Neurological:     Mental Status: She is alert and oriented to person, place, and time.  Psychiatric:        Mood and Affect: Mood is anxious.        Behavior: Behavior normal.          Scheduled Medications:   azithromycin   500 mg Oral Daily   heparin   5,000 Units Subcutaneous Q8H   levothyroxine   112 mcg Oral Q0600   losartan   25 mg Oral BID   mirtazapine   15 mg Oral QHS   polyethylene glycol  17 g Oral  Daily   simvastatin   20 mg Oral Daily   sodium chloride  flush  3 mL Intravenous Q12H    Continuous Infusions:  cefTRIAXone  (ROCEPHIN )  IV 2 g (05/19/24 1546)    PRN Medications:  acetaminophen  **OR** acetaminophen , acetaminophen -codeine , bisacodyl , guaiFENesin -dextromethorphan , ondansetron  **OR** ondansetron  (ZOFRAN ) IV, senna-docusate, sodium phosphate   Antimicrobials from admission:  Anti-infectives (From admission, onward)    Start     Dose/Rate Route Frequency Ordered Stop   05/18/24 1600  cefTRIAXone  (ROCEPHIN ) 2 g in sodium chloride  0.9 % 100 mL IVPB        2 g 200 mL/hr over 30 Minutes Intravenous Every  24 hours 05/17/24 2304     05/18/24 1545  azithromycin  (ZITHROMAX ) tablet 500 mg        500 mg Oral Daily 05/17/24 2304     05/17/24 1545  cefTRIAXone  (ROCEPHIN ) 2 g in sodium chloride  0.9 % 100 mL IVPB        2 g 200 mL/hr over 30 Minutes Intravenous Once 05/17/24 1531 05/17/24 1752   05/17/24 1545  azithromycin  (ZITHROMAX ) 500 mg in sodium chloride  0.9 % 250 mL IVPB  Status:  Discontinued        500 mg 250 mL/hr over 60 Minutes Intravenous  Once 05/17/24 1531 05/17/24 1531   05/17/24 1545  azithromycin  (ZITHROMAX ) tablet 500 mg        500 mg Oral  Once 05/17/24 1531 05/17/24 1709           Data Reviewed:  I have personally reviewed the following...  CBC: Recent Labs  Lab 05/17/24 1401 05/18/24 0725 05/19/24 0705  WBC 27.8* 18.6* 11.6*  HGB 11.5* 9.4* 9.1*  HCT 34.5* 28.6* 27.0*  MCV 94.8 94.1 92.8  PLT 475* 379 357   Basic Metabolic Panel: Recent Labs  Lab 05/17/24 1401 05/18/24 0725 05/19/24 0705  NA 139 139 139  K 4.3 4.2 3.5  CL 99 104 104  CO2 25 25 27   GLUCOSE 119* 80 98  BUN 19 17 13   CREATININE 1.59* 1.34* 1.28*  CALCIUM  8.4* 7.8* 7.7*  MG 1.8  --   --    GFR: Estimated Creatinine Clearance: 29.4 mL/min (A) (by C-G formula based on SCr of 1.28 mg/dL (H)). Liver Function Tests: No results for input(s): AST, ALT, ALKPHOS,  BILITOT, PROT, ALBUMIN in the last 168 hours. No results for input(s): LIPASE, AMYLASE in the last 168 hours. No results for input(s): AMMONIA in the last 168 hours. Coagulation Profile: No results for input(s): INR, PROTIME in the last 168 hours. Cardiac Enzymes: No results for input(s): CKTOTAL, CKMB, CKMBINDEX, TROPONINI in the last 168 hours. BNP (last 3 results) No results for input(s): PROBNP in the last 8760 hours. HbA1C: No results for input(s): HGBA1C in the last 72 hours. CBG: Recent Labs  Lab 05/18/24 0809 05/19/24 0819 05/20/24 0806  GLUCAP 93 82 93   Lipid Profile: No results for input(s): CHOL, HDL, LDLCALC, TRIG, CHOLHDL, LDLDIRECT in the last 72 hours. Thyroid  Function Tests: No results for input(s): TSH, T4TOTAL, FREET4, T3FREE, THYROIDAB in the last 72 hours. Anemia Panel: No results for input(s): VITAMINB12, FOLATE, FERRITIN, TIBC, IRON , RETICCTPCT in the last 72 hours. Most Recent Urinalysis On File:     Component Value Date/Time   COLORURINE YELLOW (A) 02/06/2021 1957   APPEARANCEUR HAZY (A) 02/06/2021 1957   LABSPEC 1.016 02/06/2021 1957   PHURINE 5.0 02/06/2021 1957   GLUCOSEU NEGATIVE 02/06/2021 1957   HGBUR NEGATIVE 02/06/2021 1957   BILIRUBINUR NEGATIVE 02/06/2021 1957   KETONESUR NEGATIVE 02/06/2021 1957   PROTEINUR 30 (A) 02/06/2021 1957   NITRITE NEGATIVE 02/06/2021 1957   LEUKOCYTESUR MODERATE (A) 02/06/2021 1957   Sepsis Labs: @LABRCNTIP (procalcitonin:4,lacticidven:4) Microbiology: Recent Results (from the past 240 hours)  Resp panel by RT-PCR (RSV, Flu A&B, Covid) Anterior Nasal Swab     Status: None   Collection Time: 05/17/24  3:34 PM   Specimen: Anterior Nasal Swab  Result Value Ref Range Status   SARS Coronavirus 2 by RT PCR NEGATIVE NEGATIVE Final    Comment: (NOTE) SARS-CoV-2 target nucleic acids are NOT DETECTED.  The SARS-CoV-2 RNA is generally detectable  in upper  respiratory specimens during the acute phase of infection. The lowest concentration of SARS-CoV-2 viral copies this assay can detect is 138 copies/mL. A negative result does not preclude SARS-Cov-2 infection and should not be used as the sole basis for treatment or other patient management decisions. A negative result may occur with  improper specimen collection/handling, submission of specimen other than nasopharyngeal swab, presence of viral mutation(s) within the areas targeted by this assay, and inadequate number of viral copies(<138 copies/mL). A negative result must be combined with clinical observations, patient history, and epidemiological information. The expected result is Negative.  Fact Sheet for Patients:  bloggercourse.com  Fact Sheet for Healthcare Providers:  seriousbroker.it  This test is no t yet approved or cleared by the [place]  FDA and  has been authorized for detection and/or diagnosis of SARS-CoV-2 by FDA under an Emergency Use Authorization (EUA). This EUA will remain  in effect (meaning this test can be used) for the duration of the COVID-19 declaration under Section 564(b)(1) of the Act, 21 U.S.C.section 360bbb-3(b)(1), unless the authorization is terminated  or revoked sooner.       Influenza A by PCR NEGATIVE NEGATIVE Final   Influenza B by PCR NEGATIVE NEGATIVE Final    Comment: (NOTE) The Xpert Xpress SARS-CoV-2/FLU/RSV plus assay is intended as an aid in the diagnosis of influenza from Nasopharyngeal swab specimens and should not be used as a sole basis for treatment. Nasal washings and aspirates are unacceptable for Xpert Xpress SARS-CoV-2/FLU/RSV testing.  Fact Sheet for Patients: bloggercourse.com  Fact Sheet for Healthcare Providers: seriousbroker.it  This test is not yet approved or cleared by the [place]  FDA and has been  authorized for detection and/or diagnosis of SARS-CoV-2 by FDA under an Emergency Use Authorization (EUA). This EUA will remain in effect (meaning this test can be used) for the duration of the COVID-19 declaration under Section 564(b)(1) of the Act, 21 U.S.C. section 360bbb-3(b)(1), unless the authorization is terminated or revoked.     Resp Syncytial Virus by PCR NEGATIVE NEGATIVE Final    Comment: (NOTE) Fact Sheet for Patients: bloggercourse.com  Fact Sheet for Healthcare Providers: seriousbroker.it  This test is not yet approved or cleared by the [place]  FDA and has been authorized for detection and/or diagnosis of SARS-CoV-2 by FDA under an Emergency Use Authorization (EUA). This EUA will remain in effect (meaning this test can be used) for the duration of the COVID-19 declaration under Section 564(b)(1) of the Act, 21 U.S.C. section 360bbb-3(b)(1), unless the authorization is terminated or revoked.  Performed at [facility], [address] [place], [place] [address]   Blood culture (routine x 2)     Status: None (Preliminary result)   Collection Time: 05/17/24  4:55 PM   Specimen: BLOOD  Result Value Ref Range Status   Specimen Description BLOOD BLOOD LEFT ARM  Final   Special Requests   Final    BOTTLES DRAWN AEROBIC AND ANAEROBIC Blood Culture results may not be optimal due to an inadequate volume of blood received in culture bottles   Culture   Final    NO GROWTH 3 DAYS Performed at [facility], [address] [place], [place] [address]    Report Status PENDING  Incomplete  Blood culture (routine x 2)     Status: None (Preliminary result)   Collection Time: 05/17/24  5:23 PM   Specimen: BLOOD  Result Value Ref Range Status   Specimen Description BLOOD BLOOD RIGHT ARM  Final   Special Requests   Final    BOTTLES DRAWN AEROBIC AND ANAEROBIC Blood Culture results may not be optimal due  to an inadequate volume of blood received in culture bottles   Culture   Final    NO GROWTH 3 DAYS Performed at [facility], [address], [place], [place] [address]    Report Status PENDING  Incomplete      Radiology Studies last 3 days: CT Angio Chest Pulmonary Embolism (PE) W or WO Contrast Result Date: 05/17/2024 EXAM: CTA of the Chest with contrast for PE 05/17/2024 10:32:40 PM TECHNIQUE: CTA of the chest was performed without and with the administration of 75 mL of intravenous contrast (iohexol  (OMNIPAQUE ) 350 MG/ML injection 60 mL IOHEXOL  350 MG/ML SOLN). Multiplanar reformatted images are provided for review. MIP images are provided for review. Automated exposure control, iterative reconstruction, and/or weight based adjustment of the mA/kV was utilized to reduce the radiation dose to as low as reasonably achievable. COMPARISON: CTA chest dated 01/09/2022. CLINICAL HISTORY: Pulmonary embolism (PE) suspected, high prob. Cough, SOB, abnormal CXR FINDINGS: PULMONARY ARTERIES: Pulmonary arteries are adequately opacified for evaluation. No pulmonary embolism. Main pulmonary artery is normal in caliber. MEDIASTINUM: The heart and pericardium demonstrate no acute abnormality. Mild thoracic aortic atherosclerosis. LYMPH NODES: Thoracic nodal metastases, including a 2.9 cm subcarinal node and 2.4 cm left perihilar node. LUNGS AND PLEURA: Masslike narrowing of the left mainstem bronchus (image 52). Associated peribronchovascular patchy opacity in the central left lower lobe, suggesting tumor, with post obstructive patchy opacity versus lymphangitic spread of tumor in the lingula and left lower lobe. Dominant 4.3 cm lobulated anterior left lower lobe mass (image 73). Additional dominant 3.8 cm right apical mass (image 18). Additional 7 mm satellite nodule/metastasis in the posterior left upper lobe (image 33). Small left pleural effusion. No pneumothorax. UPPER ABDOMEN: Limited images of the  upper abdomen are unremarkable. SOFT TISSUES AND BONES: No acute bone or soft tissue abnormality. IMPRESSION: 1. No pulmonary embolism. 2. Masslike narrowing of the left mainstem bronchus. Two dominant masses and right lung apex and left upper lobe. 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 3. Left lower lobe/lingular postobstructive opacity vs lymphangitic spread of tumor. 4. Subcarinal and left perihilar nodal metastases. 5. Small left pleural effusion. Electronically signed by: Pinkie Pebbles MD 05/17/2024 10:43 PM EST RP Workstation: [id number]   DG Chest 2 View Result Date: 05/17/2024 EXAM: 2 VIEW(S) XRAY OF THE CHEST 05/17/2024 02:30:00 PM COMPARISON: Chest CT 01/09/2022. CLINICAL HISTORY: Cough and SOB. FINDINGS: LUNGS AND PLEURA: There is a new round 3.5 cm right apical mass. Patchy airspace opacities and masslike opacity are seen in the inferior left upper lobe, also new from prior. No pleural effusion. No pneumothorax. HEART AND MEDIASTINUM: Aortic atherosclerosis. No acute abnormality of the cardiac and mediastinal silhouettes. BONES AND SOFT TISSUES: Degenerative changes in the left shoulder. IMPRESSION: 1. New 3.5 cm right apical pulmonary mass, suspicious for malignancy, and new patchy left upper lobe airspace opacity with a masslike component. Chest CT is recommended for further evaluation. 2. Small left pleural effusion without pneumothorax. Electronically signed by: Greig Pique MD 05/17/2024 03:24 PM EST RP Workstation: [id number]       Laneta Blunt, DO [facility] 05/20/2024, 2:00 PM    Dictation software may have been used to generate the above note. Typos may occur and escape review in typed/dictated notes. Please contact Dr Blunt directly for clarity if needed.  Staff may message me via  secure chat in Epic  but this may not receive an immediate response,  please page me for urgent matters!  If 7PM-7AM, please contact  night coverage [email or web address]       "

## 2024-05-21 ENCOUNTER — Other Ambulatory Visit: Payer: Self-pay

## 2024-05-21 ENCOUNTER — Encounter
Admission: EM | Disposition: A | Discharge status: Home / Self Care | Payer: Self-pay | Source: Home / Self Care | Attending: Osteopathic Medicine

## 2024-05-21 ENCOUNTER — Inpatient Hospital Stay: Payer: Self-pay | Admitting: Anesthesiology

## 2024-05-21 ENCOUNTER — Inpatient Hospital Stay

## 2024-05-21 ENCOUNTER — Encounter: Payer: Self-pay | Admitting: Internal Medicine

## 2024-05-21 ENCOUNTER — Encounter: Payer: Self-pay | Admitting: Oncology

## 2024-05-21 DIAGNOSIS — C539 Malignant neoplasm of cervix uteri, unspecified: Secondary | ICD-10-CM | POA: Diagnosis not present

## 2024-05-21 DIAGNOSIS — A419 Sepsis, unspecified organism: Secondary | ICD-10-CM | POA: Diagnosis not present

## 2024-05-21 DIAGNOSIS — Z515 Encounter for palliative care: Secondary | ICD-10-CM | POA: Diagnosis not present

## 2024-05-21 DIAGNOSIS — I110 Hypertensive heart disease with heart failure: Secondary | ICD-10-CM | POA: Diagnosis not present

## 2024-05-21 DIAGNOSIS — I5032 Chronic diastolic (congestive) heart failure: Secondary | ICD-10-CM | POA: Diagnosis not present

## 2024-05-21 DIAGNOSIS — J189 Pneumonia, unspecified organism: Secondary | ICD-10-CM | POA: Diagnosis not present

## 2024-05-21 LAB — BASIC METABOLIC PANEL WITH GFR
Anion gap: 12 (ref 5–15)
BUN: 9 mg/dL (ref 8–23)
CO2: 26 mmol/L (ref 22–32)
Calcium: 7.6 mg/dL — ABNORMAL LOW (ref 8.9–10.3)
Chloride: 100 mmol/L (ref 98–111)
Creatinine, Ser: 1.23 mg/dL — ABNORMAL HIGH (ref 0.44–1.00)
GFR, Estimated: 46 mL/min — ABNORMAL LOW
Glucose, Bld: 84 mg/dL (ref 70–99)
Potassium: 3.5 mmol/L (ref 3.5–5.1)
Sodium: 138 mmol/L (ref 135–145)

## 2024-05-21 LAB — CBC
HCT: 29.4 % — ABNORMAL LOW (ref 36.0–46.0)
Hemoglobin: 9.8 g/dL — ABNORMAL LOW (ref 12.0–15.0)
MCH: 31.4 pg (ref 26.0–34.0)
MCHC: 33.3 g/dL (ref 30.0–36.0)
MCV: 94.2 fL (ref 80.0–100.0)
Platelets: 398 K/uL (ref 150–400)
RBC: 3.12 MIL/uL — ABNORMAL LOW (ref 3.87–5.11)
RDW: 14.2 % (ref 11.5–15.5)
WBC: 11.6 K/uL — ABNORMAL HIGH (ref 4.0–10.5)
nRBC: 0 % (ref 0.0–0.2)

## 2024-05-21 LAB — GLUCOSE, CAPILLARY
Glucose-Capillary: 77 mg/dL (ref 70–99)
Glucose-Capillary: 77 mg/dL (ref 70–99)

## 2024-05-21 MED ORDER — PROPOFOL 10 MG/ML IV BOLUS
INTRAVENOUS | Status: DC | PRN
  Administered 2024-05-21: 125 ug/kg/min via INTRAVENOUS
  Administered 2024-05-21: 110 mg via INTRAVENOUS

## 2024-05-21 MED ORDER — ROCURONIUM BROMIDE 10 MG/ML (PF) SYRINGE
PREFILLED_SYRINGE | INTRAVENOUS | Status: DC | PRN
  Administered 2024-05-21: 50 mg via INTRAVENOUS

## 2024-05-21 MED ORDER — SUGAMMADEX SODIUM 200 MG/2ML IV SOLN
INTRAVENOUS | Status: DC | PRN
  Administered 2024-05-21: 200 mg via INTRAVENOUS

## 2024-05-21 MED ORDER — LIDOCAINE HCL (CARDIAC) PF 100 MG/5ML IV SOSY
PREFILLED_SYRINGE | INTRAVENOUS | Status: DC | PRN
  Administered 2024-05-21: 80 mg via INTRAVENOUS

## 2024-05-21 MED ORDER — AMOXICILLIN-POT CLAVULANATE 875-125 MG PO TABS
1.0000 | ORAL_TABLET | Freq: Two times a day (BID) | ORAL | 0 refills | Status: AC
  Filled 2024-05-21: qty 20, 10d supply, fill #0

## 2024-05-21 MED ORDER — ONDANSETRON HCL 4 MG PO TABS
4.0000 mg | ORAL_TABLET | Freq: Four times a day (QID) | ORAL | 0 refills | Status: AC | PRN
  Filled 2024-05-21: qty 30, 4d supply, fill #0

## 2024-05-21 MED ORDER — DEXAMETHASONE SOD PHOSPHATE PF 10 MG/ML IJ SOLN
INTRAMUSCULAR | Status: DC | PRN
  Administered 2024-05-21: 8 mg via INTRAVENOUS

## 2024-05-21 MED ORDER — FENTANYL CITRATE (PF) 100 MCG/2ML IJ SOLN
INTRAMUSCULAR | Status: DC | PRN
  Administered 2024-05-21 (×2): 25 ug via INTRAVENOUS

## 2024-05-21 MED ORDER — DROPERIDOL 2.5 MG/ML IJ SOLN
0.6250 mg | Freq: Once | INTRAMUSCULAR | Status: AC | PRN
  Administered 2024-05-21: 0.625 mg via INTRAVENOUS

## 2024-05-21 MED ORDER — LIDOCAINE HCL (PF) 2 % IJ SOLN
INTRAMUSCULAR | Status: AC
  Filled 2024-05-21: qty 5

## 2024-05-21 MED ORDER — DROPERIDOL 2.5 MG/ML IJ SOLN
INTRAMUSCULAR | Status: AC
  Filled 2024-05-21: qty 2

## 2024-05-21 MED ORDER — ROCURONIUM BROMIDE 10 MG/ML (PF) SYRINGE
PREFILLED_SYRINGE | INTRAVENOUS | Status: AC
  Filled 2024-05-21: qty 10

## 2024-05-21 MED ORDER — LOSARTAN POTASSIUM 50 MG PO TABS: 25.0000 mg | ORAL_TABLET | Freq: Two times a day (BID) | ORAL | Status: AC

## 2024-05-21 MED ORDER — SODIUM CHLORIDE 0.9 % IV SOLN: INTRAVENOUS | Status: DC | PRN

## 2024-05-21 MED ORDER — FENTANYL CITRATE (PF) 100 MCG/2ML IJ SOLN
INTRAMUSCULAR | Status: AC
  Filled 2024-05-21: qty 2

## 2024-05-21 MED ORDER — FENTANYL CITRATE (PF) 100 MCG/2ML IJ SOLN: 25.0000 ug | INTRAMUSCULAR | Status: DC | PRN

## 2024-05-21 MED ORDER — PROPOFOL 1000 MG/100ML IV EMUL
INTRAVENOUS | Status: AC
  Filled 2024-05-21: qty 100

## 2024-05-21 MED ORDER — ONDANSETRON HCL 4 MG/2ML IJ SOLN
INTRAMUSCULAR | Status: DC | PRN
  Administered 2024-05-21: 4 mg via INTRAVENOUS

## 2024-05-21 MED ORDER — BENZONATATE 200 MG PO CAPS
200.0000 mg | ORAL_CAPSULE | Freq: Three times a day (TID) | ORAL | 0 refills | Status: AC
  Filled 2024-05-21: qty 30, 10d supply, fill #0

## 2024-05-21 MED ORDER — PROPOFOL 10 MG/ML IV BOLUS
INTRAVENOUS | Status: AC
  Filled 2024-05-21: qty 20

## 2024-05-21 MED ORDER — GUAIFENESIN-CODEINE 100-10 MG/5ML PO SOLN
5.0000 mL | ORAL | 0 refills | Status: AC | PRN
  Filled 2024-05-21: qty 237, 4d supply, fill #0

## 2024-05-21 MED ORDER — PHENYLEPHRINE 80 MCG/ML (10ML) SYRINGE FOR IV PUSH (FOR BLOOD PRESSURE SUPPORT)
PREFILLED_SYRINGE | INTRAVENOUS | Status: DC | PRN
  Administered 2024-05-21 (×4): 120 ug via INTRAVENOUS

## 2024-05-21 NOTE — Progress Notes (Signed)
 PT Cancellation Note  Patient Details Name: Rebecca Parker MRN: [id number] DOB: 1951-01-10   Cancelled Treatment:    Reason Eval/Treat Not Completed: Patient at procedure or test/unavailable Will re-attempt at later time/date.  Edit Ricciardelli 05/21/2024, 11:04 AM

## 2024-05-21 NOTE — Progress Notes (Signed)
"  °  Daily Progress Note   Patient Name: Rebecca Parker       Date: 05/21/2024 DOB: 1950/08/20  Age: 74 y.o. MRN#: [id number] Attending Physician: Marsa Edelman, DO Primary Care Physician: Melvin Pao, NP Admit Date: 05/17/2024 Length of Stay: 3 days  Reason for Consultation/Follow-up: Establishing goals of care  Subjective:   CC: Establishing goals of care  Subjective: Chart reviewed including personal review of most recent notes from pulmonology and primary hospitalist.  Labs today reviewed and remarkable for creatinine 1.23, white blood count 11.6  I saw and examined patient today.  She was lying in bed in no distress at time of my encounter.  We discussed understanding of what is going on and she knows that she has a mass in her lung and plan is for bronchoscopy today with biopsy.  She tells me again she is very scared about what this means for the future and that she is very fearful that she has a return of cancer.  Her goal remains to pursue any and all interventions to prolong her life.  Again became tearful during conversation.  Review of Systems Reports cough but otherwise states feeling fine.  Objective:   Vital Signs:  BP 130/76 (BP Location: Right Arm)   Pulse 84   Temp 98.4 F (36.9 C)   Resp 18   Ht 5' 1.5 (1.562 m)   Wt 58.9 kg   SpO2 97%   BMI 24.14 kg/m   Physical Exam: General: NAD, alert Eyes: conjunctiva clear, anicteric sclera HENT: normocephalic, atraumatic, moist mucous membranes Cardiovascular: RRRRespiratory: Decreased breath sounds but no distress Abdomen: not distended Skin: no rashes or lesions on visible skin Neuro: A&O, following commands easily Psych: appropriately answers all questions  Imaging: @IMAGES @  I personally reviewed recent imaging.   Assessment & Plan:   Assessment: 74 year old female with history of hypertension, hyperlipidemia, diabetes, cervical cancer and thyroid  cancer with new lung mass and plan for  biopsy today. Recommendations/Plan: # Complex medical decision making/goals of care:  - Reviewed again her understanding of illness.  She knows she has a lung mass and plan is for bronchoscopy today.  She again confirms that she is interested in pursuing any and all interventions and wants to discuss with oncology once results of biopsy are back.  - Reports she is appreciative of follow-up today for continued support.  - Confirmed that her son and daughter would be her surrogate if there was an event where she cannot make her own medical decisions.  - Discussed that she will likely discharge soon before biopsy results are back.  If she does happen to remain admitted, she is open to be stopping back by to continue discussion with her after biopsy results are available.  -  Code Status: Full Code  Prognosis: Unable to determine  Discussed with: Patient  Thank you for allowing the palliative care team to participate in the care Rebecca Parker.  Amaryllis Meissner, MD Palliative Care Provider PMT # [phone]  If patient remains symptomatic despite maximum doses, please call PMT at [phone] between 0700 and 1900. Outside of these hours, please call attending, as PMT does not have night coverage.   I personally spent a total of 25 minutes in the care of the patient today including preparing to see the patient, getting/reviewing separately obtained history, performing a medically appropriate exam/evaluation, and documenting clinical information in the EHR.  "

## 2024-05-21 NOTE — Anesthesia Procedure Notes (Signed)
 Procedure Name: Intubation Date/Time: 05/21/2024 11:29 AM  Performed by: Myra Lawless, CRNAPre-anesthesia Checklist: Patient identified, Patient being monitored, Timeout performed, Emergency Drugs available and Suction available Patient Re-evaluated:Patient Re-evaluated prior to induction Oxygen Delivery Method: Circle system utilized Preoxygenation: Pre-oxygenation with 100% oxygen Induction Type: IV induction Ventilation: Mask ventilation without difficulty Laryngoscope Size: Mac and 3 Grade View: Grade I Tube type: Oral Tube size: 8.5 mm Number of attempts: 1 Airway Equipment and Method: Stylet Placement Confirmation: ETT inserted through vocal cords under direct vision, positive ETCO2 and breath sounds checked- equal and bilateral Secured at: 21 cm Tube secured with: Tape Dental Injury: Teeth and Oropharynx as per pre-operative assessment

## 2024-05-21 NOTE — Plan of Care (Signed)

## 2024-05-21 NOTE — Transfer of Care (Signed)
 Immediate Anesthesia Transfer of Care Note  Patient: Rebecca Parker  Procedure(s) Performed: VIDEO BRONCHOSCOPY WITH ENDOBRONCHIAL NAVIGATION (Bilateral)  Patient Location: PACU  Anesthesia Type:General  Level of Consciousness: drowsy  Airway & Oxygen Therapy: Patient Spontanous Breathing and Patient connected to face mask oxygen  Post-op Assessment: Report given to RN, Post -op Vital signs reviewed and stable, and Patient moving all extremities X 4  Post vital signs: Reviewed and stable  Last Vitals:  Vitals Value Taken Time  BP 110/75 05/21/24 12:27  Temp 36.4 C 05/21/24 12:27  Pulse 86 05/21/24 12:27  Resp 18 05/21/24 12:27  SpO2 88 % 05/21/24 12:27  Vitals shown include unfiled device data.  Last Pain:  Vitals:   05/21/24 1105  TempSrc:   PainSc: 0-No pain      Patients Stated Pain Goal: 0 (05/20/24 2133)  Complications: No notable events documented.

## 2024-05-21 NOTE — Procedures (Signed)
 " FIBEROPTIC BRONCHOSCOPY WITH BRONCHOALVEOLAR LAVAGE PROCEDURE NOTE 31624  THERAPEUTIC ASPIRATION OF TRACHEOBRONCHIAL TREE 31645  ENDOBRONCHIAL ULTRASOUND X >/1 LYMPH NODE PROCEDURE NOTE 31652   TRANSBRONCHIAL SURGICAL LUNG BIOPSY X 1 LOBE 31628      Flexible bronchoscopy was performed  by : Parris MD  assistance by : 1)Repiratory therapist  and 2)cytotech staff and 3) Anesthesia team and 4) Flouroscopy team and 5) IT supporting staff for robotic platform   Indication for the procedure was :  Pre-procedural H&P. The following assessment was performed on the day of the procedure prior to initiating sedation History:  Chest pain n Dyspnea y Hemoptysis n Cough y Fever n Other pertinent items n  Examination Vital signs -reviewed as per nursing documentation today Cardiac    Murmurs: n  Rubs : n  Gallop: n Lungs Wheezing: n Rales : n Rhonchi :y  Other pertinent findings: SOB/hypoxemia due to chronic lung disease   Pre-procedural assessment for Procedural Sedation included: Depth of sedation: As per anesthesia team  ASA Classification:  2 Mallampati airway assessment: 3    Medication list reviewed: y  The patients interval history was taken and revealed: no new complaints The pre- procedure physical examination revealed: No new findings Refer to prior clinic note for details.  Informed Consent: Informed consent was obtained from:  patient after explanation of procedure and risks, benefits, as well as alternative procedures available.  Explanation of level of sedation and possible transfusion was also provided.    Procedural Preparation: Time out was performed and patient was identified by name and birthdate and procedure to be performed and side for sampling, if any, was specified. Pt was intubated by anesthesia.  The patient was appropriately draped.   Fiberoptic bronchoscopy with airway inspection, therapeutic aspiration of tracheobronchial tree and BAL  Procedure findings:  Bronchoscope was inserted via ETT  without difficulty.  Posterior oropharynx, epiglottis, arytenoids, false cords and vocal cords were not visualized as these were bypassed by endotracheal tube. The distal trachea was normal in circumference and appearance without mucosal, cartilaginous or branching abnormalities.  The main carina was mildly splayed . All right and left lobar airways were NOT visualized to the Subsegmental level due to obstructed airways from phlegm and mucus  plugging. Mucus plugging with partial to complete airway obstruction was noted bilaterally and treated with therapeutic aspiration prior to beginning of robotic bronchoscopy to allow adequate visualization and accurate lung biopsy and improve patients respiratory status.   The mucosa was : friable at target segment  Airways were notable for:        exophytic lesions :n       extrinsic compression in the following distributions: n.       Friable mucosa: y       Teacher, music /pigmentation: n      Media Information  Hilar mass worse on left side   Post procedure Diagnosis:   Mucus plugging of tracheobronchial tree with hilar mass     Target 1 - HILAR MASS Transbronchial surgical pathology x 10   BAL was performed at this location and sent for processing with cytology    Post procedure diagnosis:  HILAR MASS WITH MUCUS PLUGGING      Endobronchial ultrasound assisted hilar and mediastinal lymph node biopsies procedure findings: The fiberoptic bronchoscope was removed and the EBUS scope was introduced. Examination began to evaluate for pathologically enlarged lymph nodes starting on the RIGHT side progressing to the LEFT side.  All lymph node biopsies performed  with 21G  needle. Lymph node biopsies were sent in cytolite for all stations.  STATION 7 - 2.1CM BIOPSIED 3 TIMES UNABLE TO VISUALIZE OTHER STATIONS DUE TO MASS  Post procedure diagnosis:  MEDIASTINAL  ADENOPATHY     Immediate sampling complications included:NONE immediate  Epinephrine  ZERO ml was used topically  The bronchoscopy was terminated due to completion of the planned procedure and the bronchoscope was removed.   Total dosage of Lidocaine  was ZERO mg  Estimated Blood loss: EXPECTED < 5cc.  Complications included:  NONE   Preliminary CXR findings :  IN PROCESS  Disposition: HOME WITH FAMILY   Follow up with Dr. Meredyth Hornung in 5 days for result discussion.     Halina Picking MD  [facility] Division of Pulmonary & Critical Care Medicine   "

## 2024-05-21 NOTE — Discharge Summary (Signed)
 "    Physician Discharge Summary   Patient: Rebecca Parker MRN: [id number]  DOB: 01-12-1951   Admit:     Date of Admission: 05/17/2024 Admitted from: home   Discharge: Date of discharge: 05/21/24 Disposition: Home Condition at discharge: fair  CODE STATUS: FULL CODE     Discharge Physician: Laneta Blunt, DO [facility]     PCP: Melvin Pao, NP  Recommendations for Outpatient Follow-up:  Follow up with PCP Melvin Pao, NP in 2-4 weeks general follow up  Following up with oncology next week       Discharge Diagnoses: Principal Problem:   Sepsis due to pneumonia [facility]) Active Problems:   Benign essential hypertension   Chronic diastolic CHF (congestive heart failure), NYHA class 2 (HCC)   Mixed hyperlipidemia   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HCC)   Stage 3b chronic kidney disease (HCC)   Diabetes mellitus treated with oral medication (HCC)   Thyroid  cancer [facility])       Hospital course / significant events:   HPI: Rebecca Parker is a 74 y.o. year old female with medical history of hypertension, hyperlipidemia, type 2 diabetes, history of cervical cancer and thyroid  cancer presenting to the ED with cough and shortness of breath.  Patient reports this has been going on for over a month but during the last 4 to 5 days it has become worse.  Denies any fevers or chills.  Per daughter at bedside she states patient has been weak and just feeling unwell.  She reports decreased appetite.  When discussed missing some of her appointments and not scheduling some of the recommended appointments, daughter stated she did not know about this.  Given this was worsening, she presented to the ED for further evaluation.   01/19: On arrival to the ED, VSS. CBC with significant leukocytosis at 27.8k, anemia at baseline.  There is mild thrombocytosis.  BMP with baseline renal function.  Magnesium  within normal limits.  Negative for COVID, flu, RSV.  Imaging showed new  pulmonary mass that is 3.5 cm concerning for malignancy along with left upper lobe airspace opacity.  Given coughing, leukocytosis with opacity on imaging concerning for pneumonia,[facility] contacted for admission.  01/20: Pulmo & Onco c/s for lung mass, may need bronchoscopy if full scope tx is the goal. Palliative care c/s  01/21: per pulmonary, plan for bronchoscopy this Friday 01/23 01/22: await bronch tomorrow, elevated temp this morning continue abx pna  01/23: bronchoscopy today - biopsy mass, significant mucus as well. Discussion w/ pulm - no concerns for dc home. Discussion w/ onc - plan for follow up in about a week to go voer results and next steps.      Consultants:  Pulmonology Oncology  Palliative Care   Procedures/Surgeries: none      ASSESSMENT & PLAN:   Suspected malignancy of the lung-primary lung versus metastatic [prior history of stage III cervical cancer status post definitive chemoradiation]  Mass-like narrowing of the left mainstem bronchus - Associated peribronchovascular patchy opacity in the central left lower lobe, suggesting tumor, with post obstructive patchy opacity versus lymphangitic spread of tumor in the lingula and left lower lobe.  bronchoscopy today - biopsy mass, significant mucus as well.  Discussion w/ pulm - no concerns for dc home.  Discussion w/ onc - plan for follow up in about a week to go voer results and next steps. Likely for PET scan also   Pneumonia Finish course augmentin  Symptomatic tx - antitussives    Hypertension  controlled for now Resume home losartan  at lower dose (25 mg bid instead of 50 mg bid)    Chronic HFpEF not in exacerbation Patient is euvolemic on exam.  Diuresis as needed    AKI on CKD 3a: improved and now at baseline.  Monitor periodic BMP   Anemia Anemia with hemoglobin of 9 g/dL, likely multifactorial due to chronic kidney disease and infection. Monitor CBC  Type 2 diabetes:  resume home medicines.     Hx Cervical cancer/Thyroid  Cancer:  Follows with oncology at [facility].  She will need close follow-up with PCP, oncology along with endocrinology.    Hep C infection:  scheduled to see GI in 05/2024 to initiate treatment.  She has history of nonadherence to her medical care.  Discussed importance for close follow-up.        No concerns based on BMI: Body mass index is 23.03 kg/m.SABRA Significantly low or high BMI is associated with higher medical risk.  Underweight - under 18  overweight - 25 to 29 obese - 30 or more Class 1 obesity: BMI of 30.0 to 34 Class 2 obesity: BMI of 35.0 to 39 Class 3 obesity: BMI of 40.0 to 49 Super Morbid Obesity: BMI 50-59 Super-super Morbid Obesity: BMI 60+ Healthy nutrition and physical activity advised as adjunct to other disease management and risk reduction treatments             Discharge Instructions  Allergies as of 05/21/2024       Reactions   Oxybutynin  Itching, Other (See Comments)   Pregabalin  Other (See Comments)   Hallucentations   Ace Inhibitors Itching   Rash   Cyclobenzaprine Itching   Rash and 'made me nervous'        Medication List     STOP taking these medications    hydrOXYzine  25 MG tablet Commonly known as: ATARAX        TAKE these medications    amoxicillin -clavulanate 875-125 MG tablet Commonly known as: AUGMENTIN  Take 1 tablet by mouth 2 (two) times daily for 10 days.   aspirin EC 81 MG tablet Take 81 mg by mouth daily.   benzonatate  200 MG capsule Commonly known as: TESSALON  Take 1 capsule (200 mg total) by mouth in the morning, at noon, and at bedtime.   calcitRIOL  0.25 MCG capsule Commonly known as: ROCALTROL  Take 1 capsule (0.25 mcg total) by mouth daily.   Calcium  Carbonate-Vitamin D 500-125 MG-UNIT Tabs Take 1 tablet by mouth daily.   ferrous sulfate  325 (65 FE) MG tablet Commonly known as: FeroSul Take 1 tablet (325 mg total) by mouth 2 (two) times daily with a meal.    guaiFENesin -codeine  100-10 MG/5ML syrup Take 5-10 mLs by mouth every 4 (four) hours as needed for cough.   levothyroxine  112 MCG tablet Commonly known as: SYNTHROID  Take 1 tablet (112 mcg total) by mouth daily.   losartan  50 MG tablet Commonly known as: COZAAR  Take 0.5 tablets (25 mg total) by mouth 2 (two) times daily. What changed: how much to take   metFORMIN  500 MG tablet Commonly known as: GLUCOPHAGE  Take 1 tablet (500 mg total) by mouth 2 (two) times daily with a meal.   mirtazapine  15 MG tablet Commonly known as: REMERON  Take 1 tablet (15 mg total) by mouth at bedtime.   ondansetron  4 MG tablet Commonly known as: ZOFRAN  Take 1-2 tablets (4-8 mg total) by mouth every 6 (six) hours as needed for nausea or vomiting. What changed:  how much to take reasons  to take this   simvastatin  20 MG tablet Commonly known as: ZOCOR  Take 1 tablet (20 mg total) by mouth daily.          Allergies[1]   Subjective: pt examined post bronchoscopy, she is tired and is in poor spirits about the diagnosis but feels ready to go home as long as tolerating lunch. Reports cough but no SOB at rest, denies chest pain   Discharge Exam: BP 130/81 (BP Location: Left Arm)   Pulse 93   Temp 98 F (36.7 C)   Resp 18   Ht 5' 1.5 (1.562 m)   Wt 58.9 kg   SpO2 95%   BMI 24.14 kg/m  General: Pt is alert, awake, not in acute distress Cardiovascular: RRR, S1/S2 +, no rubs, no gallops Respiratory: decreased breath sounds, scattered rhonchi more pronounced at lung bases  Abdominal: Soft, NT, ND, bowel sounds + Extremities: no edema, no cyanosis     The results of significant diagnostics from this hospitalization (including imaging, microbiology, ancillary and laboratory) are listed below for reference.     Microbiology: Recent Results (from the past 240 hours)  Resp panel by RT-PCR (RSV, Flu A&B, Covid) Anterior Nasal Swab     Status: None   Collection Time: 05/17/24  3:34 PM    Specimen: Anterior Nasal Swab  Result Value Ref Range Status   SARS Coronavirus 2 by RT PCR NEGATIVE NEGATIVE Final    Comment: (NOTE) SARS-CoV-2 target nucleic acids are NOT DETECTED.  The SARS-CoV-2 RNA is generally detectable in upper respiratory specimens during the acute phase of infection. The lowest concentration of SARS-CoV-2 viral copies this assay can detect is 138 copies/mL. A negative result does not preclude SARS-Cov-2 infection and should not be used as the sole basis for treatment or other patient management decisions. A negative result may occur with  improper specimen collection/handling, submission of specimen other than nasopharyngeal swab, presence of viral mutation(s) within the areas targeted by this assay, and inadequate number of viral copies(<138 copies/mL). A negative result must be combined with clinical observations, patient history, and epidemiological information. The expected result is Negative.  Fact Sheet for Patients:  bloggercourse.com  Fact Sheet for Healthcare Providers:  seriousbroker.it  This test is no t yet approved or cleared by the [place]  FDA and  has been authorized for detection and/or diagnosis of SARS-CoV-2 by FDA under an Emergency Use Authorization (EUA). This EUA will remain  in effect (meaning this test can be used) for the duration of the COVID-19 declaration under Section 564(b)(1) of the Act, 21 U.S.C.section 360bbb-3(b)(1), unless the authorization is terminated  or revoked sooner.       Influenza A by PCR NEGATIVE NEGATIVE Final   Influenza B by PCR NEGATIVE NEGATIVE Final    Comment: (NOTE) The Xpert Xpress SARS-CoV-2/FLU/RSV plus assay is intended as an aid in the diagnosis of influenza from Nasopharyngeal swab specimens and should not be used as a sole basis for treatment. Nasal washings and aspirates are unacceptable for Xpert Xpress  SARS-CoV-2/FLU/RSV testing.  Fact Sheet for Patients: bloggercourse.com  Fact Sheet for Healthcare Providers: seriousbroker.it  This test is not yet approved or cleared by the [place]  FDA and has been authorized for detection and/or diagnosis of SARS-CoV-2 by FDA under an Emergency Use Authorization (EUA). This EUA will remain in effect (meaning this test can be used) for the duration of the COVID-19 declaration under Section 564(b)(1) of the Act, 21 U.S.C. section 360bbb-3(b)(1), unless the authorization is  terminated or revoked.     Resp Syncytial Virus by PCR NEGATIVE NEGATIVE Final    Comment: (NOTE) Fact Sheet for Patients: bloggercourse.com  Fact Sheet for Healthcare Providers: seriousbroker.it  This test is not yet approved or cleared by the [place]  FDA and has been authorized for detection and/or diagnosis of SARS-CoV-2 by FDA under an Emergency Use Authorization (EUA). This EUA will remain in effect (meaning this test can be used) for the duration of the COVID-19 declaration under Section 564(b)(1) of the Act, 21 U.S.C. section 360bbb-3(b)(1), unless the authorization is terminated or revoked.  Performed at [facility], [address] [place], [place] [address]   Blood culture (routine x 2)     Status: None (Preliminary result)   Collection Time: 05/17/24  4:55 PM   Specimen: BLOOD  Result Value Ref Range Status   Specimen Description BLOOD BLOOD LEFT ARM  Final   Special Requests   Final    BOTTLES DRAWN AEROBIC AND ANAEROBIC Blood Culture results may not be optimal due to an inadequate volume of blood received in culture bottles   Culture   Final    NO GROWTH 4 DAYS Performed at [facility], [address], [place], [place] [address]    Report Status PENDING  Incomplete  Blood culture (routine x 2)     Status: None  (Preliminary result)   Collection Time: 05/17/24  5:23 PM   Specimen: BLOOD  Result Value Ref Range Status   Specimen Description BLOOD BLOOD RIGHT ARM  Final   Special Requests   Final    BOTTLES DRAWN AEROBIC AND ANAEROBIC Blood Culture results may not be optimal due to an inadequate volume of blood received in culture bottles   Culture   Final    NO GROWTH 4 DAYS Performed at [facility], [address] [place], [place] [address]    Report Status PENDING  Incomplete     Labs: BNP (last 3 results) No results for input(s): BNP in the last 8760 hours. Basic Metabolic Panel: Recent Labs  Lab 05/17/24 1401 05/18/24 0725 05/19/24 0705 05/21/24 0423  NA 139 139 139 138  K 4.3 4.2 3.5 3.5  CL 99 104 104 100  CO2 25 25 27 26   GLUCOSE 119* 80 98 84  BUN 19 17 13 9   CREATININE 1.59* 1.34* 1.28* 1.23*  CALCIUM  8.4* 7.8* 7.7* 7.6*  MG 1.8  --   --   --    Liver Function Tests: No results for input(s): AST, ALT, ALKPHOS, BILITOT, PROT, ALBUMIN in the last 168 hours. No results for input(s): LIPASE, AMYLASE in the last 168 hours. No results for input(s): AMMONIA in the last 168 hours. CBC: Recent Labs  Lab 05/17/24 1401 05/18/24 0725 05/19/24 0705 05/21/24 0423  WBC 27.8* 18.6* 11.6* 11.6*  HGB 11.5* 9.4* 9.1* 9.8*  HCT 34.5* 28.6* 27.0* 29.4*  MCV 94.8 94.1 92.8 94.2  PLT 475* 379 357 398   Cardiac Enzymes: No results for input(s): CKTOTAL, CKMB, CKMBINDEX, TROPONINI in the last 168 hours. BNP: Invalid input(s): POCBNP CBG: Recent Labs  Lab 05/18/24 0809 05/19/24 0819 05/20/24 0806 05/21/24 1042 05/21/24 1230  GLUCAP 93 82 93 77 77   D-Dimer No results for input(s): DDIMER in the last 72 hours. Hgb A1c No results for input(s): HGBA1C in the last 72 hours. Lipid Profile No results for input(s): CHOL, HDL, LDLCALC, TRIG, CHOLHDL, LDLDIRECT in the last 72 hours. Thyroid  function studies No results for  input(s): TSH, T4TOTAL, T3FREE, THYROIDAB in the last 72 hours.  Invalid input(s): FREET3 Anemia work up No results for input(s): VITAMINB12, FOLATE, FERRITIN, TIBC, IRON , RETICCTPCT in the last 72 hours. Urinalysis    Component Value Date/Time   COLORURINE YELLOW (A) 02/06/2021 1957   APPEARANCEUR HAZY (A) 02/06/2021 1957   LABSPEC 1.016 02/06/2021 1957   PHURINE 5.0 02/06/2021 1957   GLUCOSEU NEGATIVE 02/06/2021 1957   HGBUR NEGATIVE 02/06/2021 1957   BILIRUBINUR NEGATIVE 02/06/2021 1957   KETONESUR NEGATIVE 02/06/2021 1957   PROTEINUR 30 (A) 02/06/2021 1957   NITRITE NEGATIVE 02/06/2021 1957   LEUKOCYTESUR MODERATE (A) 02/06/2021 1957   Sepsis Labs Recent Labs  Lab 05/17/24 1401 05/18/24 0725 05/19/24 0705 05/21/24 0423  WBC 27.8* 18.6* 11.6* 11.6*   Microbiology Recent Results (from the past 240 hours)  Resp panel by RT-PCR (RSV, Flu A&B, Covid) Anterior Nasal Swab     Status: None   Collection Time: 05/17/24  3:34 PM   Specimen: Anterior Nasal Swab  Result Value Ref Range Status   SARS Coronavirus 2 by RT PCR NEGATIVE NEGATIVE Final    Comment: (NOTE) SARS-CoV-2 target nucleic acids are NOT DETECTED.  The SARS-CoV-2 RNA is generally detectable in upper respiratory specimens during the acute phase of infection. The lowest concentration of SARS-CoV-2 viral copies this assay can detect is 138 copies/mL. A negative result does not preclude SARS-Cov-2 infection and should not be used as the sole basis for treatment or other patient management decisions. A negative result may occur with  improper specimen collection/handling, submission of specimen other than nasopharyngeal swab, presence of viral mutation(s) within the areas targeted by this assay, and inadequate number of viral copies(<138 copies/mL). A negative result must be combined with clinical observations, patient history, and epidemiological information. The expected result is  Negative.  Fact Sheet for Patients:  bloggercourse.com  Fact Sheet for Healthcare Providers:  seriousbroker.it  This test is no t yet approved or cleared by the [place]  FDA and  has been authorized for detection and/or diagnosis of SARS-CoV-2 by FDA under an Emergency Use Authorization (EUA). This EUA will remain  in effect (meaning this test can be used) for the duration of the COVID-19 declaration under Section 564(b)(1) of the Act, 21 U.S.C.section 360bbb-3(b)(1), unless the authorization is terminated  or revoked sooner.       Influenza A by PCR NEGATIVE NEGATIVE Final   Influenza B by PCR NEGATIVE NEGATIVE Final    Comment: (NOTE) The Xpert Xpress SARS-CoV-2/FLU/RSV plus assay is intended as an aid in the diagnosis of influenza from Nasopharyngeal swab specimens and should not be used as a sole basis for treatment. Nasal washings and aspirates are unacceptable for Xpert Xpress SARS-CoV-2/FLU/RSV testing.  Fact Sheet for Patients: bloggercourse.com  Fact Sheet for Healthcare Providers: seriousbroker.it  This test is not yet approved or cleared by the [place]  FDA and has been authorized for detection and/or diagnosis of SARS-CoV-2 by FDA under an Emergency Use Authorization (EUA). This EUA will remain in effect (meaning this test can be used) for the duration of the COVID-19 declaration under Section 564(b)(1) of the Act, 21 U.S.C. section 360bbb-3(b)(1), unless the authorization is terminated or revoked.     Resp Syncytial Virus by PCR NEGATIVE NEGATIVE Final    Comment: (NOTE) Fact Sheet for Patients: bloggercourse.com  Fact Sheet for Healthcare Providers: seriousbroker.it  This test is not yet approved or cleared by the [place]  FDA and has been authorized for detection and/or diagnosis  of  SARS-CoV-2 by FDA under an Emergency Use Authorization (EUA). This EUA will remain in effect (meaning this test can be used) for the duration of the COVID-19 declaration under Section 564(b)(1) of the Act, 21 U.S.C. section 360bbb-3(b)(1), unless the authorization is terminated or revoked.  Performed at [facility], [address] [place], [place] [address]   Blood culture (routine x 2)     Status: None (Preliminary result)   Collection Time: 05/17/24  4:55 PM   Specimen: BLOOD  Result Value Ref Range Status   Specimen Description BLOOD BLOOD LEFT ARM  Final   Special Requests   Final    BOTTLES DRAWN AEROBIC AND ANAEROBIC Blood Culture results may not be optimal due to an inadequate volume of blood received in culture bottles   Culture   Final    NO GROWTH 4 DAYS Performed at [facility], [address], [place], [place] [address]    Report Status PENDING  Incomplete  Blood culture (routine x 2)     Status: None (Preliminary result)   Collection Time: 05/17/24  5:23 PM   Specimen: BLOOD  Result Value Ref Range Status   Specimen Description BLOOD BLOOD RIGHT ARM  Final   Special Requests   Final    BOTTLES DRAWN AEROBIC AND ANAEROBIC Blood Culture results may not be optimal due to an inadequate volume of blood received in culture bottles   Culture   Final    NO GROWTH 4 DAYS Performed at [facility], [address] [place], [place] [address]    Report Status PENDING  Incomplete   Imaging CT Angio Chest Pulmonary Embolism (PE) W or WO Contrast Result Date: 05/17/2024 EXAM: CTA of the Chest with contrast for PE 05/17/2024 10:32:40 PM TECHNIQUE: CTA of the chest was performed without and with the administration of 75 mL of intravenous contrast (iohexol  (OMNIPAQUE ) 350 MG/ML injection 60 mL IOHEXOL  350 MG/ML SOLN). Multiplanar reformatted images are provided for review. MIP images are provided for review. Automated exposure control, iterative  reconstruction, and/or weight based adjustment of the mA/kV was utilized to reduce the radiation dose to as low as reasonably achievable. COMPARISON: CTA chest dated 01/09/2022. CLINICAL HISTORY: Pulmonary embolism (PE) suspected, high prob. Cough, SOB, abnormal CXR FINDINGS: PULMONARY ARTERIES: Pulmonary arteries are adequately opacified for evaluation. No pulmonary embolism. Main pulmonary artery is normal in caliber. MEDIASTINUM: The heart and pericardium demonstrate no acute abnormality. Mild thoracic aortic atherosclerosis. LYMPH NODES: Thoracic nodal metastases, including a 2.9 cm subcarinal node and 2.4 cm left perihilar node. LUNGS AND PLEURA: Masslike narrowing of the left mainstem bronchus (image 52). Associated peribronchovascular patchy opacity in the central left lower lobe, suggesting tumor, with post obstructive patchy opacity versus lymphangitic spread of tumor in the lingula and left lower lobe. Dominant 4.3 cm lobulated anterior left lower lobe mass (image 73). Additional dominant 3.8 cm right apical mass (image 18). Additional 7 mm satellite nodule/metastasis in the posterior left upper lobe (image 33). Small left pleural effusion. No pneumothorax. UPPER ABDOMEN: Limited images of the upper abdomen are unremarkable. SOFT TISSUES AND BONES: No acute bone or soft tissue abnormality. IMPRESSION: 1. No pulmonary embolism. 2. Masslike narrowing of the left mainstem bronchus. Two dominant masses and right lung apex and left upper lobe. 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 3. Left lower lobe/lingular postobstructive opacity vs lymphangitic spread of tumor. 4. Subcarinal and left perihilar nodal metastases. 5. Small left pleural effusion. Electronically signed by:  Pinkie Pebbles MD 05/17/2024 10:43 PM EST RP Workstation: [id number]   DG Chest 2 View Result Date: 05/17/2024 EXAM: 2 VIEW(S) XRAY OF THE CHEST 05/17/2024 02:30:00 PM COMPARISON: Chest CT 01/09/2022.  CLINICAL HISTORY: Cough and SOB. FINDINGS: LUNGS AND PLEURA: There is a new round 3.5 cm right apical mass. Patchy airspace opacities and masslike opacity are seen in the inferior left upper lobe, also new from prior. No pleural effusion. No pneumothorax. HEART AND MEDIASTINUM: Aortic atherosclerosis. No acute abnormality of the cardiac and mediastinal silhouettes. BONES AND SOFT TISSUES: Degenerative changes in the left shoulder. IMPRESSION: 1. New 3.5 cm right apical pulmonary mass, suspicious for malignancy, and new patchy left upper lobe airspace opacity with a masslike component. Chest CT is recommended for further evaluation. 2. Small left pleural effusion without pneumothorax. Electronically signed by: Greig Pique MD 05/17/2024 03:24 PM EST RP Workstation: [id number]      Time coordinating discharge: over 30 minutes  SIGNED:  Laneta Blunt DO [facility]       [1]  Allergies Allergen Reactions   Oxybutynin  Itching and Other (See Comments)   Pregabalin  Other (See Comments)    Hallucentations   Ace Inhibitors Itching    Rash   Cyclobenzaprine Itching    Rash and 'made me nervous'   "

## 2024-05-21 NOTE — Progress Notes (Signed)
 "    PULMONOLOGY         Date: 05/21/2024,   MRN# [id number] Rebecca Parker 1950/10/13     AdmissionWeight: 62.1 kg                 CurrentWeight: 58.9 kg  Referring provider: Dr Rebecca Parker    CHIEF COMPLAINT:   Chronic cough with lung mass   HISTORY OF PRESENT ILLNESS   Ms. pleasant 74 year old female with a history of chronic anemia, B12 deficiency, cervical cancer, chest pain chronically, diastolic CHF, history of cocaine abuse, diabetes, thyroid  cancer, hepatitis, edentulous status, dyslipidemia, hypothyroidism, who came in for worsening dyspnea over the past 4 days prior to admission.  She denies having any sick contacts on arrival to the ED she had leukocytosis CBC with mild thrombocytopenia and renal dysfunction on BMP.  Her viral testing was negative.  She had CXR and CT chest.  I reviewed her CT chest independently, she appears to have consolidation/mass of the left lung with hilar and mediastinal adenopathy, as well as a well-circumscribed right upper lobe mass.   05/19/24- spoke with patient and her daughter Rebecca Parker regarding goals of care , she wants everything done.  We discussed lung biopsy patient wants to have bronchoscopy done. We made her NPO post midnight Thursday night for Friday procedure date probably in the afternoon.   05/20/24- patient for bronchoscopy in AM.  NPO tonight for procedure in AM. S/p anesthesia evaluation.  05/21/24- no new findings today.  Plan for bronchoscopy for tissue diagnosis of potential lung cancer.   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, cN1, cM0); Tx'd with Cisplatin  + EBRT   Chest pain, unspecified    CHF (congestive heart failure) (HCC)    Chronic diastolic heart failure, NYHA class 2 (HCC)    Cocaine use    none since Cancer dx (11/2021)   Diabetes mellitus without complication (HCC)     Edentulous    lost dentures   Grade I diastolic dysfunction    Hepatitis    Hip pain    HLD (hyperlipidemia)    Hyperplastic colon polyp    Hypertension    Hypothyroidism    Multinodular goiter    Port-A-Cath in place    Thyroid  cancer (HCC)    Thyroid  mass    a.) 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)   Tubular adenoma of colon      SURGICAL HISTORY   Past Surgical History:  Procedure Laterality Date   ABDOMINAL HYSTERECTOMY     CATARACT EXTRACTION W/PHACO Left 08/14/2023   Procedure: PHACOEMULSIFICATION, CATARACT, WITH IOL INSERTION  3.93  00:31.6;  Surgeon: Enola Feliciano Hugger, MD;  Location: [facility];  Service: Ophthalmology;  Laterality: Left;   CATARACT EXTRACTION W/PHACO Right 08/28/2023   Procedure: PHACOEMULSIFICATION, CATARACT, WITH IOL INSERTION 2.67 00:21.9;  Surgeon: Enola Feliciano Hugger, MD;  Location: [facility];  Service: Ophthalmology;  Laterality: Right;   COLONOSCOPY WITH PROPOFOL  N/A 11/24/2015   Procedure: COLONOSCOPY WITH PROPOFOL ;  Surgeon: Gladis RAYMOND Mariner, MD;  Location: [facility];  Service: Endoscopy;  Laterality: N/A;   COLONOSCOPY WITH PROPOFOL  N/A 03/18/2019   Procedure: COLONOSCOPY WITH PROPOFOL ;  Surgeon: Janalyn Keene NOVAK, MD;  Location: [facility] ENDOSCOPY;  Service: Endoscopy;  Laterality: N/A;   DILATION AND  CURETTAGE OF UTERUS N/A 12/01/2020   Procedure: DILATATION AND CURETTAGE with cervical biopsies;  Surgeon: Schermerhorn, Debby PARAS, MD;  Location: [facility] ORS;  Service: Gynecology;  Laterality: N/A;   ESOPHAGOGASTRODUODENOSCOPY (EGD) WITH PROPOFOL  N/A 03/18/2019   Procedure: ESOPHAGOGASTRODUODENOSCOPY (EGD) WITH PROPOFOL ;  Surgeon: Janalyn Keene NOVAK, MD;  Location: [facility] ENDOSCOPY;  Service: Endoscopy;  Laterality: N/A;   PORTA CATH INSERTION N/A 01/16/2021   Procedure: PORTA CATH INSERTION;  Surgeon: Jama Cordella MATSU, MD;  Location: [facility] INVASIVE CV LAB;  Service:  Cardiovascular;  Laterality: N/A;   PORTA CATH REMOVAL N/A 10/28/2023   Procedure: PORTA CATH REMOVAL;  Surgeon: Jama Cordella MATSU, MD;  Location: [facility] INV[facility] CV LAB;  Service: Cardiovascular;  Laterality: N/A;   right ankle orif     THYROIDECTOMY, COMPLETION Bilateral 12/18/2021     FAMILY HISTORY   Family History  Problem Relation Age of Onset   Hypertension Mother    Breast cancer Maternal Aunt    Breast cancer Paternal Aunt    Hypertension Maternal Grandfather      SOCIAL HISTORY   Social History[1]   MEDICATIONS    Home Medication:    Current Medication: Current Medications[2]    ALLERGIES   Oxybutynin , Pregabalin , Ace inhibitors, and Cyclobenzaprine     REVIEW OF SYSTEMS    Review of Systems:  Gen:  Denies  fever, sweats, chills weigh loss  HEENT: Denies blurred vision, double vision, ear pain, eye pain, hearing loss, nose bleeds, sore throat Cardiac:  No dizziness, chest pain or heaviness, chest tightness,edema Resp:   reports dyspnea chronically  Gi: Denies swallowing difficulty, stomach pain, nausea or vomiting, diarrhea, constipation, bowel incontinence Gu:  Denies bladder incontinence, burning urine Ext:   Denies Joint pain, stiffness or swelling Skin: Denies  skin rash, easy bruising or bleeding or hives Endoc:  Denies polyuria, polydipsia , polyphagia or weight change Psych:   Denies depression, insomnia or hallucinations   Other:  All other systems negative   VS: BP 138/86 (BP Location: Right Arm)   Pulse 89   Temp 98.3 F (36.8 C) (Oral)   Resp 18   Ht 5' 1.5 (1.562 m)   Wt 58.9 kg   SpO2 93%   BMI 24.14 kg/m      PHYSICAL EXAM    GENERAL:NAD, no fevers, chills, no weakness no fatigue HEAD: Normocephalic, atraumatic.  EYES: Pupils equal, round, reactive to light. Extraocular muscles intact. No scleral icterus.  MOUTH: Moist mucosal membrane. Dentition intact. No abscess noted.  EAR, NOSE, THROAT: Clear without exudates.  No external lesions.  NECK: Supple. No thyromegaly. No nodules. No JVD.  PULMONARY: decreased breath sounds with mild rhonchi worse at bases bilaterally.  CARDIOVASCULAR: S1 and S2. Regular rate and rhythm. No murmurs, rubs, or gallops. No edema. Pedal pulses 2+ bilaterally.  GASTROINTESTINAL: Soft, nontender, nondistended. No masses. Positive bowel sounds. No hepatosplenomegaly.  MUSCULOSKELETAL: No swelling, clubbing, or edema. Range of motion full in all extremities.  NEUROLOGIC: Cranial nerves II through XII are intact. No gross focal neurological deficits. Sensation intact. Reflexes intact.  SKIN: No ulceration, lesions, rashes, or cyanosis. Skin warm and dry. Turgor intact.  PSYCHIATRIC: Mood, affect within normal limits. The patient is awake, alert and oriented x 3. Insight, judgment intact.       IMAGING   Narrative & Impression  EXAM: CTA of the Chest with contrast for PE 05/17/2024 10:32:40 PM   TECHNIQUE: CTA of the chest was performed without and with the administration of 75 mL  of intravenous contrast (iohexol  (OMNIPAQUE ) 350 MG/ML injection 60 mL IOHEXOL  350 MG/ML SOLN). Multiplanar reformatted images are provided for review. MIP images are provided for review. Automated exposure control, iterative reconstruction, and/or weight based adjustment of the mA/kV was utilized to reduce the radiation dose to as low as reasonably achievable.   COMPARISON: CTA chest dated 01/09/2022.   CLINICAL HISTORY: Pulmonary embolism (PE) suspected, high prob. Cough, SOB, abnormal CXR   FINDINGS:   PULMONARY ARTERIES: Pulmonary arteries are adequately opacified for evaluation. No pulmonary embolism. Main pulmonary artery is normal in caliber.   MEDIASTINUM: The heart and pericardium demonstrate no acute abnormality. Mild thoracic aortic atherosclerosis.   LYMPH NODES: Thoracic nodal metastases, including a 2.9 cm subcarinal node and 2.4 cm left perihilar node.   LUNGS AND  PLEURA: Masslike narrowing of the left mainstem bronchus (image 52). Associated peribronchovascular patchy opacity in the central left lower lobe, suggesting tumor, with post obstructive patchy opacity versus lymphangitic spread of tumor in the lingula and left lower lobe. Dominant 4.3 cm lobulated anterior left lower lobe mass (image 73). Additional dominant 3.8 cm right apical mass (image 18). Additional 7 mm satellite nodule/metastasis in the posterior left upper lobe (image 33). Small left pleural effusion. No pneumothorax.   UPPER ABDOMEN: Limited images of the upper abdomen are unremarkable.   SOFT TISSUES AND BONES: No acute bone or soft tissue abnormality.   IMPRESSION: 1. No pulmonary embolism. 2. Masslike narrowing of the left mainstem bronchus. Two dominant masses and right lung apex and left upper lobe. 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 3. Left lower lobe/lingular postobstructive opacity vs lymphangitic spread of tumor. 4. Subcarinal and left perihilar nodal metastases. 5. Small left pleural effusion.   Electronically signed by: Pinkie Pebbles MD 05/17/2024 10:43 PM EST RP Workstation: [id number]      ASSESSMENT/PLAN   Bilateral lung mass with hilar adenopathy  Highly concerning for metastatic cancer  - will need to discuss goals of care and consider tissue sampling if patient wishes to have full scope of care Masslike narrowing of the left mainstem bronchus (image 52). Associated peribronchovascular patchy opacity in the central left lower lobe, suggesting tumor, with post obstructive patchy opacity versus lymphangitic spread of tumor in the lingula and left lower lobe.  -for bronchoscopy today        Thank you for allowing me to participate in the care of this patient.   Patient/Family are satisfied with care plan and all questions have been answered.    Provider disclosure: Patient with at least one acute  or chronic illness or injury that poses a threat to life or bodily function and is being managed actively during this encounter.  All of the below services have been performed independently by signing provider:  review of prior documentation from internal and or external health records.  Review of previous and current lab results.  Interview and comprehensive assessment during patient visit today. Review of current and previous chest radiographs/CT scans. Discussion of management and test interpretation with health care team and patient/family.   This document was prepared using Dragon voice recognition software and may include unintentional dictation errors.     Alahna Dunne, M.D.  Division of Pulmonary & Critical Care Medicine                [1]  Social History Tobacco Use   Smoking status: Former    Current packs/day: 0.10    Average packs/day: 0.1 packs/day for 60.1 years (  6.0 ttl pk-yrs)    Types: Cigarettes    Start date: 04/29/1964    Passive exposure: Never   Smokeless tobacco: Never  Vaping Use   Vaping status: Never Used  Substance Use Topics   Alcohol use: Not Currently   Drug use: Yes    Types: Crack cocaine    Comment: Patient is trying to quit  [2]  Current Facility-Administered Medications:    [MAR Hold] acetaminophen  (TYLENOL ) tablet 650 mg, 650 mg, Oral, Q6H PRN, 650 mg at 05/21/24 0446 **OR** [MAR Hold] acetaminophen  (TYLENOL ) suppository 650 mg, 650 mg, Rectal, Q6H PRN, Fernand Prost, MD   [MAR Hold] acetaminophen -codeine  120-12 MG/5ML solution 10 mL, 10 mL, Oral, Q6H PRN, Erian Rosengren, MD, 10 mL at 05/18/24 1634   [MAR Hold] azithromycin  (ZITHROMAX ) tablet 500 mg, 500 mg, Oral, Daily, Khan, Ghalib, MD, 500 mg at 05/20/24 0837   [MAR Hold] bisacodyl  (DULCOLAX) suppository 10 mg, 10 mg, Rectal, Daily PRN, Marsa Edelman, DO, 10 mg at 05/19/24 1541   [MAR Hold] cefTRIAXone  (ROCEPHIN ) 2 g in sodium chloride  0.9 % 100 mL IVPB, 2 g, Intravenous, Q24H,  Fernand Prost, MD, Stopped at 05/20/24 1905   [MAR Hold] guaiFENesin -dextromethorphan  (ROBITUSSIN DM) 100-10 MG/5ML syrup 5 mL, 5 mL, Oral, Q4H PRN, Cleto Claggett, MD, 5 mL at 05/20/24 1653   [MAR Hold] HYDROcodone  bit-homatropine (HYCODAN) 5-1.5 MG/5ML syrup 5 mL, 5 mL, Oral, Q4H PRN, Marsa Edelman, DO, 5 mL at 05/20/24 1842   [MAR Hold] levothyroxine  (SYNTHROID ) tablet 112 mcg, 112 mcg, Oral, Q0600, Alexander, Natalie, DO, 112 mcg at 05/21/24 0445   [MAR Hold] losartan  (COZAAR ) tablet 25 mg, 25 mg, Oral, BID, Alexander, Natalie, DO, 25 mg at 05/20/24 2133   [facility] Hold] mirtazapine  (REMERON ) tablet 15 mg, 15 mg, Oral, QHS, Alexander, Natalie, DO, 15 mg at 05/20/24 2133   [facility] Hold] ondansetron  (ZOFRAN ) tablet 4 mg, 4 mg, Oral, Q6H PRN **OR** [MAR Hold] ondansetron  (ZOFRAN ) injection 4 mg, 4 mg, Intravenous, Q6H PRN, Khan, Ghalib, MD   [facility] Hold] polyethylene glycol (MIRALAX  / GLYCOLAX ) packet 17 g, 17 g, Oral, Daily, Marsa Edelman, DO, 17 g at 05/20/24 0837   [MAR Hold] senna-docusate (Senokot-S) tablet 1 tablet, 1 tablet, Oral, QHS PRN, Fernand Prost, MD   [facility] Hold] simvastatin  (ZOCOR ) tablet 20 mg, 20 mg, Oral, Daily, Alexander, Natalie, DO, 20 mg at 05/20/24 0837   [facility] Hold] sodium chloride  flush (NS) 0.9 % injection 3 mL, 3 mL, Intravenous, Q12H, Fernand Prost, MD, 3 mL at 05/20/24 2133   [facility] Hold] sodium phosphate  (FLEET) enema 1 enema, 1 enema, Rectal, Daily PRN, Marsa Edelman, DO, 1 enema at 05/19/24 1809  "

## 2024-05-22 LAB — CULTURE, BLOOD (ROUTINE X 2)
Culture: NO GROWTH
Culture: NO GROWTH

## 2024-05-24 ENCOUNTER — Encounter: Payer: Self-pay | Admitting: Pulmonary Disease

## 2024-05-25 ENCOUNTER — Ambulatory Visit: Admitting: Nurse Practitioner

## 2024-05-25 ENCOUNTER — Encounter: Payer: Self-pay | Admitting: Nurse Practitioner

## 2024-05-25 VITALS — BP 124/81 | HR 80 | Temp 97.8°F | Ht 61.5 in | Wt 129.0 lb

## 2024-05-25 DIAGNOSIS — E782 Mixed hyperlipidemia: Secondary | ICD-10-CM

## 2024-05-25 DIAGNOSIS — Z09 Encounter for follow-up examination after completed treatment for conditions other than malignant neoplasm: Secondary | ICD-10-CM

## 2024-05-25 DIAGNOSIS — N1832 Chronic kidney disease, stage 3b: Secondary | ICD-10-CM

## 2024-05-25 DIAGNOSIS — A419 Sepsis, unspecified organism: Secondary | ICD-10-CM

## 2024-05-25 DIAGNOSIS — E119 Type 2 diabetes mellitus without complications: Secondary | ICD-10-CM

## 2024-05-25 NOTE — Progress Notes (Unsigned)
 "  BP 124/81 (BP Location: Right Arm, Patient Position: Sitting, Cuff Size: Normal)   Pulse 80   Temp 97.8 F (36.6 C) (Oral)   Ht 5' 1.5 (1.562 m)   Wt 129 lb (58.5 kg)   SpO2 94%   BMI 23.98 kg/m    Subjective:    Patient ID: Rebecca Parker, female    DOB: 02/11/1951, 74 y.o.   MRN: [id number]  HPI: Rebecca Parker is a 74 y.o. female  Chief Complaint  Patient presents with   office visit    Follow up. Patient stated she got diagnosed with lung cancer and she just got out of the hospital Friday night.    Hospitalization Follow-up    She stated she cannot eat. It taste funny   Transition of Care Hospital Follow up.   Hospital/Facility: [facility] D/C Physician: Dr. Marsa D/C Date: 05/21/24  Records Requested: NA Records Received: NA Records Reviewed: Yes  Diagnoses on Discharge:   She feels nauseous, weak and fatigued.  She is sleeping but her son wakes her up a lot to take her medications.  Her throat feels dry.  She will follow up with Hematology this week for further evaluation of the cancer and treatment.  She complains of nausea with eating.     Suspected malignancy of the lung-primary lung versus metastatic [prior history of stage III cervical cancer status post definitive chemoradiation]  Mass-like narrowing of the left mainstem bronchus - Associated peribronchovascular patchy opacity in the central left lower lobe, suggesting tumor, with post obstructive patchy opacity versus lymphangitic spread of tumor in the lingula and left lower lobe.  bronchoscopy today - biopsy mass, significant mucus as well.  Discussion w/ pulm - no concerns for dc home.  Discussion w/ onc - plan for follow up in about a week to go voer results and next steps. Likely for PET scan also   Pneumonia Finish course augmentin  Symptomatic tx - antitussives    Hypertension controlled for now Resume home losartan  at lower dose (25 mg bid instead of 50 mg bid)    Chronic HFpEF not in  exacerbation Patient is euvolemic on exam.  Diuresis as needed    AKI on CKD 3a: improved and now at baseline.  Monitor periodic BMP   Anemia Anemia with hemoglobin of 9 g/dL, likely multifactorial due to chronic kidney disease and infection. Monitor CBC   Type 2 diabetes:  resume home medicines.    Hx Cervical cancer/Thyroid  Cancer:  Follows with oncology at [facility].  She will need close follow-up with PCP, oncology along with endocrinology.    Hep C infection:  scheduled to see GI in 05/2024 to initiate treatment.  She has history of nonadherence to her medical care.  Discussed importance for close follow-up.  Date of interactive Contact within 48 hours of discharge:  Contact was through: Visit within 48 hours  Date of 7 day or 14 day face-to-face visit:    within 7 days  Outpatient Encounter Medications as of 05/25/2024  Medication Sig   amoxicillin -clavulanate (AUGMENTIN ) 875-125 MG tablet Take 1 tablet by mouth 2 (two) times daily for 10 days.   aspirin EC 81 MG tablet Take 81 mg by mouth daily.   benzonatate  (TESSALON ) 200 MG capsule Take 1 capsule (200 mg total) by mouth in the morning, at noon, and at bedtime.   calcitRIOL  (ROCALTROL ) 0.25 MCG capsule Take 1 capsule (0.25 mcg total) by mouth daily.   Calcium  Carbonate-Vitamin D 500-125 MG-UNIT TABS Take  1 tablet by mouth daily.   ferrous sulfate  (FEROSUL) 325 (65 FE) MG tablet Take 1 tablet (325 mg total) by mouth 2 (two) times daily with a meal.   guaiFENesin -codeine  100-10 MG/5ML syrup Take 5-10 mLs by mouth every 4 (four) hours as needed for cough.   levothyroxine  (SYNTHROID ) 112 MCG tablet Take 1 tablet (112 mcg total) by mouth daily.   losartan  (COZAAR ) 50 MG tablet Take 0.5 tablets (25 mg total) by mouth 2 (two) times daily.   metFORMIN  (GLUCOPHAGE ) 500 MG tablet Take 1 tablet (500 mg total) by mouth 2 (two) times daily with a meal.   mirtazapine  (REMERON ) 15 MG tablet Take 1 tablet (15 mg total) by mouth at bedtime.    ondansetron  (ZOFRAN ) 4 MG tablet Take 1-2 tablets (4-8 mg total) by mouth every 6 (six) hours as needed for nausea or vomiting.   simvastatin  (ZOCOR ) 20 MG tablet Take 1 tablet (20 mg total) by mouth daily.   [DISCONTINUED] prochlorperazine  (COMPAZINE ) 10 MG tablet Take 1 tablet (10 mg total) by mouth every 6 (six) hours as needed (Nausea or vomiting).   No facility-administered encounter medications on file as of 05/25/2024.    Diagnostic Tests Reviewed/Disposition: Reviewed  Consults: Oncology and Pulmonology  Discharge Instructions: Reviewed  Disease/illness Education: Reviewed   Home Health/Community Services Discussions/Referrals: NA  Establishment or re-establishment of referral orders for community resources: NA  Discussion with other health care providers: None  Assessment and Support of treatment regimen adherence: Assessed today  Appointments Coordinated with: Patient  Education for self-management, independent living, and ADLs: Independent  Relevant past medical, surgical, family and social history reviewed and updated as indicated. Interim medical history since our last visit reviewed. Allergies and medications reviewed and updated.  Review of Systems  Constitutional:  Positive for appetite change and fatigue.  Gastrointestinal:  Positive for nausea.    Per HPI unless specifically indicated above     Objective:    BP 124/81 (BP Location: Right Arm, Patient Position: Sitting, Cuff Size: Normal)   Pulse 80   Temp 97.8 F (36.6 C) (Oral)   Ht 5' 1.5 (1.562 m)   Wt 129 lb (58.5 kg)   SpO2 94%   BMI 23.98 kg/m   Wt Readings from Last 3 Encounters:  05/25/24 129 lb (58.5 kg)  05/21/24 129 lb 13.6 oz (58.9 kg)  04/27/24 124 lb 8 oz (56.5 kg)    Physical Exam Vitals and nursing note reviewed.  Constitutional:      Appearance: She is not toxic-appearing or diaphoretic.  HENT:     Head: Normocephalic.     Right Ear: External ear normal.     Left Ear:  External ear normal.     Nose: Nose normal.     Mouth/Throat:     Mouth: Mucous membranes are moist.     Pharynx: Oropharynx is clear.  Eyes:     General:        Right eye: No discharge.        Left eye: No discharge.     Extraocular Movements: Extraocular movements intact.     Conjunctiva/sclera: Conjunctivae normal.     Pupils: Pupils are equal, round, and reactive to light.  Cardiovascular:     Rate and Rhythm: Normal rate and regular rhythm.     Heart sounds: No murmur heard. Pulmonary:     Effort: Pulmonary effort is normal. No respiratory distress.     Breath sounds: Normal breath sounds. No wheezing or rales.  Musculoskeletal:     Cervical back: Normal range of motion and neck supple.  Skin:    General: Skin is warm and dry.     Capillary Refill: Capillary refill takes less than 2 seconds.  Neurological:     General: No focal deficit present.     Mental Status: She is alert and oriented to person, place, and time. Mental status is at baseline.  Psychiatric:        Mood and Affect: Mood normal.        Behavior: Behavior normal.        Thought Content: Thought content normal.        Judgment: Judgment normal.     Results for orders placed or performed in visit on 05/25/24  Comp Met (CMET)   Collection Time: 05/25/24  3:42 PM  Result Value Ref Range   Glucose 87 70 - 99 mg/dL   BUN 12 8 - 27 mg/dL   Creatinine, Ser 8.82 (H) 0.57 - 1.00 mg/dL   eGFR 49 (L) >40 fO/fpw/8.26   BUN/Creatinine Ratio 10 (L) 12 - 28   Sodium 141 134 - 144 mmol/L   Potassium 4.0 3.5 - 5.2 mmol/L   Chloride 100 96 - 106 mmol/L   CO2 21 20 - 29 mmol/L   Calcium  7.8 (L) 8.7 - 10.3 mg/dL   Total Protein 6.2 6.0 - 8.5 g/dL   Albumin 3.0 (L) 3.8 - 4.8 g/dL   Globulin, Total 3.2 1.5 - 4.5 g/dL   Bilirubin Total <9.7 0.0 - 1.2 mg/dL   Alkaline Phosphatase 83 49 - 135 IU/L   AST 23 0 - 40 IU/L   ALT 12 0 - 32 IU/L  CBC w/Diff   Collection Time: 05/25/24  3:42 PM  Result Value Ref Range    WBC 12.5 (H) 3.4 - 10.8 x10E3/uL   RBC 3.08 (L) 3.77 - 5.28 x10E6/uL   Hemoglobin 9.8 (L) 11.1 - 15.9 g/dL   Hematocrit 71.3 (L) 65.9 - 46.6 %   MCV 93 79 - 97 fL   MCH 31.8 26.6 - 33.0 pg   MCHC 34.3 31.5 - 35.7 g/dL   RDW 87.0 88.2 - 84.5 %   Platelets 480 (H) 150 - 450 x10E3/uL   Neutrophils 82 Not Estab. %   Lymphs 11 Not Estab. %   Monocytes 1 Not Estab. %   Eos 0 Not Estab. %   Basos 0 Not Estab. %   Immature Cells Note    Neutrophils Absolute 10.3 (H) 1.4 - 7.0 x10E3/uL   Lymphocytes Absolute 1.4 0.7 - 3.1 x10E3/uL   Monocytes Absolute 0.1 0.1 - 0.9 x10E3/uL   EOS (ABSOLUTE) 0.0 0.0 - 0.4 x10E3/uL   Basophils Absolute 0.0 0.0 - 0.2 x10E3/uL   Hematology Comments: Note:   Immature Cells   Collection Time: 05/25/24  3:42 PM  Result Value Ref Range   Metamyelocytes 4 (H) 0 - 0 %   MYELOCYTES 2 (H) 0 - 0 %      Assessment & Plan:   Problem List Items Addressed This Visit       Endocrine   Diabetes mellitus treated with oral medication (HCC)   Chronic.  Controlled.  Continue with current medication regimen.  Labs ordered today.  Return to clinic in 6 months for reevaluation.  Call sooner if concerns arise.          Genitourinary   Stage 3b chronic kidney disease (HCC) (Chronic)   Chronic.  Controlled.  Continue  with current medication regimen.  Cr tending down on discharge.  Labs ordered today.  Return to clinic in 6 months for reevaluation.  Call sooner if concerns arise.        Other Visit Diagnoses       Hospital discharge follow-up    -  Primary   Improved from hospitalization. Continues to have fatigue. CBC and CMP checked. Encouraged Zofran  20-30 mins prior to eating to see if appetite improves.   Relevant Orders   Comp Met (CMET) (Completed)   CBC w/Diff (Completed)        Follow up plan: Return in about 3 months (around 08/23/2024) for HTN, HLD, DM2 FU.      "

## 2024-05-26 ENCOUNTER — Ambulatory Visit: Payer: Self-pay | Admitting: Nurse Practitioner

## 2024-05-26 LAB — COMPREHENSIVE METABOLIC PANEL WITH GFR
ALT: 12 [IU]/L (ref 0–32)
AST: 23 [IU]/L (ref 0–40)
Albumin: 3 g/dL — ABNORMAL LOW (ref 3.8–4.8)
Alkaline Phosphatase: 83 [IU]/L (ref 49–135)
BUN/Creatinine Ratio: 10 — ABNORMAL LOW (ref 12–28)
BUN: 12 mg/dL (ref 8–27)
Bilirubin Total: <0.2 mg/dL (ref 0.0–1.2)
CO2: 21 mmol/L (ref 20–29)
Calcium: 7.8 mg/dL — ABNORMAL LOW (ref 8.7–10.3)
Chloride: 100 mmol/L (ref 96–106)
Creatinine, Ser: 1.17 mg/dL — ABNORMAL HIGH (ref 0.57–1.00)
Globulin, Total: 3.2 g/dL (ref 1.5–4.5)
Glucose: 87 mg/dL (ref 70–99)
Potassium: 4 mmol/L (ref 3.5–5.2)
Sodium: 141 mmol/L (ref 134–144)
Total Protein: 6.2 g/dL (ref 6.0–8.5)
eGFR: 49 mL/min/{1.73_m2} — ABNORMAL LOW

## 2024-05-26 LAB — CBC WITH DIFFERENTIAL/PLATELET
Basophils Absolute: 0 10*3/uL (ref 0.0–0.2)
Basos: 0 %
EOS (ABSOLUTE): 0 10*3/uL (ref 0.0–0.4)
Eos: 0 %
Hematocrit: 28.6 % — ABNORMAL LOW (ref 34.0–46.6)
Hemoglobin: 9.8 g/dL — ABNORMAL LOW (ref 11.1–15.9)
Lymphocytes Absolute: 1.4 10*3/uL (ref 0.7–3.1)
Lymphs: 11 %
MCH: 31.8 pg (ref 26.6–33.0)
MCHC: 34.3 g/dL (ref 31.5–35.7)
MCV: 93 fL (ref 79–97)
Monocytes Absolute: 0.1 10*3/uL (ref 0.1–0.9)
Monocytes: 1 %
Neutrophils Absolute: 10.3 10*3/uL — ABNORMAL HIGH (ref 1.4–7.0)
Neutrophils: 82 %
Platelets: 480 10*3/uL — ABNORMAL HIGH (ref 150–450)
RBC: 3.08 x10E6/uL — ABNORMAL LOW (ref 3.77–5.28)
RDW: 12.9 % (ref 11.7–15.4)
WBC: 12.5 10*3/uL — ABNORMAL HIGH (ref 3.4–10.8)

## 2024-05-26 LAB — IMMATURE CELLS
MYELOCYTES: 2 % — ABNORMAL HIGH (ref 0–0)
Metamyelocytes: 4 % — ABNORMAL HIGH (ref 0–0)

## 2024-05-26 LAB — CYTOLOGY - NON PAP

## 2024-05-26 LAB — SURGICAL PATHOLOGY

## 2024-05-26 NOTE — Assessment & Plan Note (Signed)
 Chronic.  Controlled.  Continue with current medication regimen.  Labs ordered today.  Return to clinic in 6 months for reevaluation.  Call sooner if concerns arise.  ? ?

## 2024-05-26 NOTE — Assessment & Plan Note (Signed)
 Chronic.  Controlled.  Continue with current medication regimen.  Cr tending down on discharge.  Labs ordered today.  Return to clinic in 6 months for reevaluation.  Call sooner if concerns arise.

## 2024-05-27 ENCOUNTER — Inpatient Hospital Stay: Attending: Obstetrics and Gynecology | Admitting: Nurse Practitioner

## 2024-05-27 ENCOUNTER — Other Ambulatory Visit: Payer: Self-pay

## 2024-05-27 ENCOUNTER — Encounter: Payer: Self-pay | Admitting: Nurse Practitioner

## 2024-05-27 ENCOUNTER — Ambulatory Visit
Admission: RE | Admit: 2024-05-27 | Discharge: 2024-05-27 | Disposition: A | Discharge status: Home / Self Care | Source: Ambulatory Visit | Attending: Radiation Oncology | Admitting: Radiation Oncology

## 2024-05-27 ENCOUNTER — Encounter: Payer: Self-pay | Admitting: Radiation Oncology

## 2024-05-27 VITALS — BP 134/98 | HR 79 | Temp 98.9°F | Resp 16 | Wt 123.9 lb

## 2024-05-27 VITALS — BP 138/88 | HR 80 | Temp 97.4°F | Resp 16

## 2024-05-27 DIAGNOSIS — C539 Malignant neoplasm of cervix uteri, unspecified: Secondary | ICD-10-CM

## 2024-05-27 DIAGNOSIS — R110 Nausea: Secondary | ICD-10-CM | POA: Diagnosis not present

## 2024-05-27 DIAGNOSIS — R918 Other nonspecific abnormal finding of lung field: Secondary | ICD-10-CM | POA: Insufficient documentation

## 2024-05-27 DIAGNOSIS — C3490 Malignant neoplasm of unspecified part of unspecified bronchus or lung: Secondary | ICD-10-CM

## 2024-05-27 DIAGNOSIS — C73 Malignant neoplasm of thyroid gland: Secondary | ICD-10-CM

## 2024-05-27 DIAGNOSIS — R432 Parageusia: Secondary | ICD-10-CM | POA: Diagnosis not present

## 2024-05-27 DIAGNOSIS — C3401 Malignant neoplasm of right main bronchus: Secondary | ICD-10-CM | POA: Insufficient documentation

## 2024-05-27 DIAGNOSIS — Z8541 Personal history of malignant neoplasm of cervix uteri: Secondary | ICD-10-CM | POA: Insufficient documentation

## 2024-05-27 DIAGNOSIS — N1832 Chronic kidney disease, stage 3b: Secondary | ICD-10-CM | POA: Insufficient documentation

## 2024-05-27 MED ORDER — OLANZAPINE 5 MG PO TABS: 5.0000 mg | ORAL_TABLET | Freq: Every day | ORAL | 0 refills | Status: AC

## 2024-05-27 NOTE — Progress Notes (Signed)
 PET order placed and pathology comparison requested for 05/21/2024, lung biopsy, hilar mass, [id number] to 12/01/2020 cervix biopsy, [id number], [id number].

## 2024-05-27 NOTE — Progress Notes (Signed)
 Radiation Oncology Follow up Note  Name: Rebecca Parker   Date:   05/27/2024 MRN:  [id number] DOB: [birth year]    This 74 y.o. female presents to the clinic today for widespread malignancy of her chest and patient previously treated 3 years prior for locally [place] squamous cell carcinoma the cervix.  REFERRING PROVIDER: Melvin Pao, NP  HPI: Patient is a 74 year old female well-known to our department having been treated back in 22 for.  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  She received chemotherapy as well as radiation therapy.  She was recently admitted with sepsis due to pneumonia.  She also has stage IIIb chronic renal disease thyroid  cancer.  CT scan of the chest recently showed masslike narrowing of the left mainstem bronchus 2 dominant masses in the right lung apex and left upper lobe as well as most likely extensive mediastinal adenopathy.  She also has left lower lobe lingular postobstructive opacity versus lymphangitic spread of tumor.  She has a PET CT scan being scheduled.  She recently underwent bronchoscopy with hilar mass showing squamous cell carcinoma.  She is seen today she is in significant pain and discomfort cannot write herself up for complete physical examination.  She is seen today believe for consideration of palliative radiation therapy.  COMPLICATIONS OF TREATMENT: none  FOLLOW UP COMPLIANCE: keeps appointments   PHYSICAL EXAM:  BP 138/88   Pulse 80   Temp (!) 97.4 F (36.3 C) (Tympanic)   Resp 16  Frail-appearing female in marked pain distress.  Lungs show decreased breath sounds bilaterally.  Well-developed well-nourished patient in NAD. HEENT reveals PERLA, EOMI, discs not visualized.  Oral cavity is clear. No oral mucosal lesions are identified. Neck is clear without evidence of cervical or supraclavicular adenopathy. Lungs are clear to A&P. Cardiac examination is essentially unremarkable with regular rate and rhythm without murmur  rub or thrill. Abdomen is benign with no organomegaly or masses noted. Motor sensory and DTR levels are equal and symmetric in the upper and lower extremities. Cranial nerves II through XII are grossly intact. Proprioception is intact. No peripheral adenopathy or edema is identified. No motor or sensory levels are noted. Crude visual fields are within normal range.  RADIOLOGY RESULTS: CT scans reviewed PET CT scan has been ordered  PLAN: At this time I am scheduling patient be seen by medical oncology as well as palliative care.  I believe she would be a hospice patient at this time.  I do not see a role for palliative radiation therapy based on the extensive nature of disease in her chest at this time.  Would consider palliative radiation therapy should she have a mopped assist further atelectasis of her lung or painful bone metastasis.  Be better able to appreciate the extent of her disease if PET/CT scan is performed.  We have set her up for appointment tomorrow with medical oncology as well as symptom management.  Will continue to follow the patient.  I would like to take this opportunity to thank you for allowing me to participate in the care of your patient.SABRA Marcey Penton, MD

## 2024-05-27 NOTE — Progress Notes (Signed)
 "  Symptom Management Clinic  [facility] at [facility] A Department of the [place]. [facility] [address] [place], [place] [address] [phone] (phone) [phone] (fax)  Patient Care Team: Melvin Pao, NP as PCP - General (Nurse Practitioner) Argentina Clap, MD as PCP - Cardiology (Cardiology) Maurie Rayfield BIRCH, RN as Oncology Nurse Navigator Babara Call, MD as Consulting Physician (Oncology) Lenn Aran, MD as Consulting Physician (Radiation Oncology) Dasie Tinnie MATSU, NP as Nurse Practitioner (Nurse Practitioner) Mancil Barter, MD as Referring Physician (Obstetrics) Verdene Gills, RN as Oncology Nurse Navigator   Name of the patient: Rebecca Parker  [id number]  1950/08/16   Date of visit: 05/27/24  Diagnosis- Squamous Cell Carcinoma  Chief complaint/ Reason for visit- Appetite changes  Heme/Onc history:  Oncology History  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HCC)  12/23/2020 Initial Diagnosis   Invasive carcinoma of cervix 12/01/2020-8/9/022 She was admitted from the ED for 1 day h/o right lower abdominal / pelvic pain . 12/01/2020 CT abdomen pelvis w contrast showed  thickly septated right ovarian mass measuring 5.1 x 5.1 cm. 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 Recommend initial pelvic ultrasound and likely subsequent MRI to further evaluate, which may be performed on a nonemergent basis. 2. Air within the fundal endometrial cavity. Correlate for recent instrumentation. 3. Pancolonic diverticulosis without evidence of acute diverticulitis.   12/01/2020 Fx D+C  and cervical bx. Uterus sounded to 9 cm.    TVUS POD#1 showed a complex right ovarian cyst with normal doppler flow Pathology- squamous cell CA of the cervix, as well as cancer in endocervix curettage and endometrial curettings    Patient was initially treatment for urosepsis, urine culture came back negative She was treated with IV antibiotics and transitioned to  Augmentin  and Flagyl .    Diastolic CHF and Diabetes.  UDS + for cocaine.    She is the oldest of 7 kids (5 girls and 2 boys). She has 3 children - all SVDs - 2 boys and one girl.   She was seen by GynOnc Dr.Secord.  Her pelvic examination showed On palpations suspect lateral vaginal disease >50% down the vault and disease involving the left fornix. Cervix enlarged >4 cm with grossly obvious tumor and hard to palpation. Uterus - may be enlarged with firm mass on the right aspect versus palpation of the right adnexal mass. Positive parametrial involvement on the right and on the left with disease to or almost to the sidewall on the left. Rectovaginal exam was confirmatory.     # 8/31 2022, PET scan showed signs of cervical cancer with diffuse involvement of the uterus.  Cystic and solid right ovarian lesion more likely related to diffuse cervical cancer.  Concomitant synchronous cystic ovarian neoplasm could have appearance but feel less likely given the diffuse nature of disease throughout the uterus and cervix.  Small lymph nodes in the pelvis in the left pelvis suspicious for nodal involvement at the common iliac level.  No solid organ distant metastasis. Right lung renal consult and glossotonsillar sulcus uptake which is asymmetric and with increased fullness.  While this may be physiologic, will suggest direct visualization for further evaluation to exclude neoplasm. Right thyroid  uptake with visible nodule.  Recommend ultrasound thyroid  and biopsy.   Patient has had baseline audiogram done and chemotherapy class. 01/03/2021 cisplatin  20 mg/m2 today.  Dose were reduced to 50% due to her impaired kidney function.  Treatment plan was switched to carboplatin  given her decreased kidney function 01/10/2021  carboplatin . Kidney function improved. 01/17/2021 cisplatin .  02/01/2021 cisplatin  02/08/2021 cisplatin  Last radiation 02/08/2021.   PD-L1 CPS 20%   01/17/2021 - 02/08/2021, concurrent  chemoradiation to cervical cancer. S/p intracavitary radiation at [facility] with Dr. Lurline.  Patient follows up with gynecology oncology Dr. Elby for surveillance.   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 Background: There is not a family history of thyroid  disease. There is not a history of x-ray exposure to the head or neck area. There is not a history of prior nuclear radiation exposure. Baseline thyroid /neck mass symptoms include anterior neck swelling, but not anterior neck pain, anterior neck tenderness, difficulty swallowing/dysphagia, painful swallowing/odynophagia, globus, hoarseness, voice change/dysphonia, cough, positional dyspnea, and persistent anterior cervical lymphadenopathy. Oncologic history: 03/09/14 US  Thyroid  + Neck @ [facility] with right nodule at 3.5x2.5x3.3-cm, left mid-lobe/lateral nodule at 2.1x1.1x1.4-cm, left mid-lobe/medial nodule at 0.8x0.7x0.8-cm, left mid-inferior nodule at 0.7x0.6x0.6-cm, and left inferior pole nodule at 0.9x0.9x1.2-cm and no nodal findings  04/25/14 US -guided Thyroid  biopsies of right mid-lobe 3.5-cm nodule and left mid-lobe/lateral nodule at 2.1-cm @ [facility] by Dr Therisa Eleanor Leep with benign cytology / Bethesda II for both nodules through [facility]  11/17/14 US  Thyroid  + Neck @ [facility] with right dominant nodule at 3.6x2.3x2.8-cm with adjacent nodules at 0.9x0.5x0.7-cm and 0.9x0.5x0.8-cm, left mid-lobe/lateral nodule at 1.8x1.1x1.5-cm, left mid-lobe/medial nodule at 1.1x0.7x0.8-cm,  left mid-inferior nodule at 0.7x0.6x0.6-cm, and left inferior nodule at 1.1x0.x1.0-cm and no nodal findings  +12/27/20 FDG PET thru [facility] reporting hypermetabolic right thyroid  nodule at 2.8-cm with SUV max of 5.9 with nodularity of left lobe, asymmetric fullness of right glossotonsillar sulcus with SUV max of 7.6, no cervical lymphadenopathy, no pulmonary nodules, and multiple sites of abdominal and pelvic activity  +02/20/21 US  Thyroid  @ [facility]) reporting right mid-lobe 4.5x0.9x3.3-cm nodule with TIRADS 4/4, right inferior 1.0x0.7x0.9-cm nodule with TIRADS 3/3, 2nd right inferior 1.1x1.0x0.9-cm nodule with TIRADS 3/3, left mid-lobe 1.3x1.0x0.7-cm nodule with TIRADS 4/4, 2nd left mid-lobe 0.9x0.7x0.7-cm nodule with TIRADS 4/4, and left inferior 1.2x1.0x1.0-cm nodule with TIRADS 3/3  +03/05/21 US -guided biopsy of right mid-lobe thyroid  nodule @ [facility]) with indeterminate cytology / Bethesda IV as Suspicious for Follicular Neoplasm (SFN) with subsequent molecular testing thru [facility] using ThyroSeq V3 GC positive for HRAS Q61R and TERT mutations thus high (>95) probability of cancer  +05/09/21 FDG PET thru [facility] with persistent hypermetabolic right thyroid  nodule with SUV max of 5.0, left thyroid  nodularity without PET correlation, no cervical lymphadenopathy, and improved abdominopelvic findings  +09/17/21 CT Chest/Abdomen/Pelvis with contrast thru [facility] reporting persistent right thyroid  nodule at 2.8-cm, and new findings of right lower lobe lung nodule at 0.5-cm and left lingular lung nodule at 0.3-cm  10/29/21  Physician-performed US  Thyroid  + Neck @ [facility] by Dr Toribio Novel with suspect nodular disease without suggestion of extrathyroidal extension or suspect cervical lymphadenopathy Flexible laryngoscopy @ [facility] by Dr Toribio Novel with normal vocal cord motion  12/18/21 Total thyroidectomy @ [facility] by Dr Toribio Novel with  surgical pathology showing: 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) at 4.8x2.9x2.6-cm with no mitoses, no tumor necrosis, negative margins, no vascular invasion (-VI), no lymphatic invasion (-LI), no perineural invasion (-PNI), no extrathyroidal extension (-ETE), and no lymph nodes thus AJCC TNM staging of [id number] with additional findings of benign adenomatoid nodule(s) or nodular follicular disease and mild chronic lymphocytic thyroiditis  Multifocal left-sided Papillary Thyroid  Carcinoma (PTC) at 0.9-cm and 0.8-cm and 0.1-cm with no mitoses, no tumor necrosis, no vascular invasion (-VI), no lymphatic invasion (-LI), no perineural invasion (-PNI), no extrathyroidal extension (-ETE), and no lymph nodes thus AJCC TNM staging of [id number]   +01/09/22 CT Chest/Abdomen/Pelvis with contrast thru [facility] reporting no thyroid , tracheal, or esophageal findings; no mediastinal or hilar lymphadenopathy, stable right lower lobe lung ground-glass nodule at 0.3-cm, stable left upper lobe lung nodule at 0.3-cm, and stable left lower lobe lung nodule at 0.4-cm       Interval history- TEARA DUERKSEN is a 74 y.o. female with history of SCC of the cervix treated with chemo-radiation in 2022, thyroid  cancer treated with surgery in August 2023, now with widespread widespread malignancy seen during hospitalization for sepsis and pneumonia, awaiting PET, who presents to symptom management clinic for complaints of poor appetite, taste changes, and nausea since her discharge from the hospital. She feels anxious about her health decline and is worried about the future. She is scared of dying. Her son says she eats bites of things but cites poor appetite.   Review of systems- Review of Systems  Constitutional:  Positive for malaise/fatigue. Negative for chills, fever and weight loss.  HENT:  Negative for hearing loss, nosebleeds, sore throat and tinnitus.   Eyes:  Negative for blurred  vision and double vision.  Respiratory:  Negative for cough, hemoptysis, shortness of breath and wheezing.   Cardiovascular:  Negative for chest pain, palpitations and leg swelling.  Gastrointestinal:  Positive for nausea. Negative for abdominal pain, blood in stool, constipation, diarrhea, melena and vomiting.  Genitourinary:  Negative for dysuria and urgency.  Musculoskeletal:  Negative for back pain, falls, joint pain and myalgias.  Skin:  Negative for itching and rash.  Neurological:  Negative for dizziness, tingling, sensory change, loss of consciousness, weakness and headaches.  Endo/Heme/Allergies:  Negative for environmental allergies. Does not bruise/bleed easily.  Psychiatric/Behavioral:  Negative for depression. The patient is not nervous/anxious and does not have insomnia.      Allergies[1]  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, cN1, cM0); Tx'd with Cisplatin  + EBRT   Chest pain, unspecified    CHF (congestive heart failure) (HCC)    Chronic diastolic heart failure, NYHA class 2 (HCC)    Cocaine use    none since Cancer dx (11/2021)   Diabetes mellitus without complication (HCC)    Edentulous    lost dentures   Grade I diastolic dysfunction    Hepatitis    Hip pain    HLD (hyperlipidemia)    Hyperplastic colon polyp    Hypertension    Hypothyroidism    Lung cancer (HCC)    Multinodular goiter    Port-A-Cath in place    Thyroid  cancer (HCC)    Thyroid  mass    a.) 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)   Tubular adenoma of colon  Past Surgical History:  Procedure Laterality Date   ABDOMINAL HYSTERECTOMY     CATARACT EXTRACTION W/PHACO Left 08/14/2023   Procedure: PHACOEMULSIFICATION, CATARACT, WITH IOL INSERTION  3.93  00:31.6;  Surgeon: Enola Feliciano Hugger, MD;  Location[facility] SUR[facility]ERY  [facility];  Service: Ophthalmology;  Laterality: Left;   CATARACT EXTRACTION W/PHACO Right 08/28/2023   Procedure: PHACOEMULSIFICATION, CATARACT, WITH IOL INSERTION 2.67 00:21.9;  Surgeon: Enola Feliciano Hugger, MD;  Location: [facility] SURGERY [facility];  Service: Ophthalmology;  Laterality: Right;   COLONOSCOPY WITH PROPOFOL  N/A 11/24/2015   Procedure: COLONOSCOPY WITH PROPOFOL ;  Surgeon: Gladis RAYMOND Mariner, MD;  Location: [facility] ENDOSCOPY;  Service: Endoscopy;  Laterality: N/A;   COLONOSCOPY WITH PROPOFOL  N/A 03/18/2019   Procedure: COLONOSCOPY WITH PROPOFOL ;  Surgeon: Janalyn Keene NOVAK, MD;  Location: [facility] ENDOSCOPY;  Service: Endoscopy;  Laterality: N/A;   DILATION AND CURETTAGE OF UTERUS N/A 12/01/2020   Procedure: DILATATION AND CURETTAGE with cervical biopsies;  Surgeon: Schermerhorn, Debby PARAS, MD;  Location: [facility] ORS;  Service: Gynecology;  Laterality: N/A;   ESOPHAGOGASTRODUODENOSCOPY (EGD) WITH PROPOFOL  N/A 03/18/2019   Procedure: ESOPHAGOGASTRODUODENOSCOPY (EGD) WITH PROPOFOL ;  Surgeon: Janalyn Keene NOVAK, MD;  Location: [facility] ENDOSCOPY;  Service: Endoscopy;  Laterality: N/A;   PORTA CATH INSERTION N/A 01/16/2021   Procedure: PORTA CATH INSERTION;  Surgeon: Jama Cordella MATSU, MD;  Location: [facility] INVASIVE CV LAB;  Service: Cardiovascular;  Laterality: N/A;   PORTA CATH REMOVAL N/A 10/28/2023   Procedure: PORTA CATH REMOVAL;  Surgeon: Jama Cordella MATSU, MD;  Location: [facility] INVASIVE CV LAB;  Service: Cardiovascular;  Laterality: N/A;   right ankle orif     THYROIDECTOMY, COMPLETION Bilateral 12/18/2021   VIDEO BRONCHOSCOPY WITH ENDOBRONCHIAL NAVIGATION Bilateral 05/21/2024   Procedure: VIDEO BRONCHOSCOPY WITH ENDOBRONCHIAL NAVIGATION;  Surgeon: Parris Manna, MD;  Location: [facility] ORS;  Service: Thoracic;  Laterality: Bilateral;    Social History   Socioeconomic History   Marital status: Legally Separated    Spouse name: Not on file   Number of children: Not on file   Years of education: Not on  file   Highest education level: Not on file  Occupational History   Not on file  Tobacco Use   Smoking status: Former    Current packs/day: 0.10    Average packs/day: 0.1 packs/day for 60.1 years (6.0 ttl pk-yrs)    Types: Cigarettes    Start date: 04/29/1964    Passive exposure: Never   Smokeless tobacco: Never  Vaping Use   Vaping status: Never Used  Substance and Sexual Activity   Alcohol use: Not Currently   Drug use: Yes    Types: Crack cocaine    Comment: Patient is trying to quit   Sexual activity: Not Currently  Other Topics Concern   Not on file  Social History Narrative   Not on file   Social Drivers of Health   Tobacco Use: Medium Risk (05/27/2024)   Patient History    Smoking Tobacco Use: Former    Smokeless Tobacco Use: Never    Passive Exposure: Never  Physicist, Medical Strain: Not on file  Food Insecurity: No Food Insecurity (05/17/2024)   Epic    Worried About Programme Researcher, Broadcasting/film/video in the Last Year: Never true    Ran Out of Food in the Last Year: Never true  Transportation Needs: No Transportation Needs (05/17/2024)   Epic    Lack of Transportation (Medical): No    Lack of Transportation (Non-Medical): No  Physical Activity: Not on file  Stress: Not  on file  Social Connections: Moderately Isolated (05/17/2024)   Social Connection and Isolation Panel    Frequency of Communication with Friends and Family: Three times a week    Frequency of Social Gatherings with Friends and Family: Twice a week    Attends Religious Services: 1 to 4 times per year    Active Member of [employer] or Organizations: No    Attends Banker Meetings: Never    Marital Status: Separated  Intimate Partner Violence: Not At Risk (05/17/2024)   Epic    Fear of Current or Ex-Partner: No    Emotionally Abused: No    Physically Abused: No    Sexually Abused: No  Depression (PHQ2-9): Low Risk (05/27/2024)   Depression (PHQ2-9)    PHQ-2 Score: 1  Recent Concern: Depression  (PHQ2-9) - Medium Risk (04/27/2024)   Depression (PHQ2-9)    PHQ-2 Score: 9  Alcohol Screen: Not on file  Housing: Low Risk (05/17/2024)   Epic    Unable to Pay for Housing in the Last Year: No    Number of Times Moved in the Last Year: 0    Homeless in the Last Year: No  Utilities: Not At Risk (05/17/2024)   Epic    Threatened with loss of utilities: No  Health Literacy: Not on file    Family History  Problem Relation Age of Onset   Hypertension Mother    Breast cancer Maternal Aunt    Breast cancer Paternal Aunt    Hypertension Maternal Grandfather     Current Outpatient Medications:    amoxicillin -clavulanate (AUGMENTIN ) 875-125 MG tablet, Take 1 tablet by mouth 2 (two) times daily for 10 days., Disp: 20 tablet, Rfl: 0   aspirin EC 81 MG tablet, Take 81 mg by mouth daily., Disp: , Rfl:    benzonatate  (TESSALON ) 200 MG capsule, Take 1 capsule (200 mg total) by mouth in the morning, at noon, and at bedtime., Disp: 30 capsule, Rfl: 0   calcitRIOL  (ROCALTROL ) 0.25 MCG capsule, Take 1 capsule (0.25 mcg total) by mouth daily., Disp: 90 capsule, Rfl: 0   Calcium  Carbonate-Vitamin D 500-125 MG-UNIT TABS, Take 1 tablet by mouth daily., Disp: , Rfl:    ferrous sulfate  (FEROSUL) 325 (65 FE) MG tablet, Take 1 tablet (325 mg total) by mouth 2 (two) times daily with a meal., Disp: 180 tablet, Rfl: 0   guaiFENesin -codeine  100-10 MG/5ML syrup, Take 5-10 mLs by mouth every 4 (four) hours as needed for cough., Disp: 237 mL, Rfl: 0   levothyroxine  (SYNTHROID ) 112 MCG tablet, Take 1 tablet (112 mcg total) by mouth daily., Disp: 90 tablet, Rfl: 0   losartan  (COZAAR ) 50 MG tablet, Take 0.5 tablets (25 mg total) by mouth 2 (two) times daily., Disp: , Rfl:    metFORMIN  (GLUCOPHAGE ) 500 MG tablet, Take 1 tablet (500 mg total) by mouth 2 (two) times daily with a meal., Disp: 180 tablet, Rfl: 0   mirtazapine  (REMERON ) 15 MG tablet, Take 1 tablet (15 mg total) by mouth at bedtime., Disp: 90 tablet, Rfl: 0    OLANZapine  (ZYPREXA ) 5 MG tablet, Take 1 tablet (5 mg total) by mouth at bedtime. For appetite and nausea, Disp: 30 tablet, Rfl: 0   ondansetron  (ZOFRAN ) 4 MG tablet, Take 1-2 tablets (4-8 mg total) by mouth every 6 (six) hours as needed for nausea or vomiting., Disp: 30 tablet, Rfl: 0   simvastatin  (ZOCOR ) 20 MG tablet, Take 1 tablet (20 mg total) by mouth daily., Disp: 90 tablet,  Rfl: 0  Physical exam:  Vitals:   05/27/24 1310  BP: (!) 156/94  Pulse: 79  Resp: 16  Temp: 98.9 F (37.2 C)  TempSrc: Tympanic  SpO2: 97%  Weight: 123 lb 14.4 oz (56.2 kg)   Physical Exam Vitals reviewed.  Constitutional:      Comments: In wheelchair. Accompanied by son.   Neurological:     Mental Status: She is alert.     Assessment and plan- Patient is a 74 y.o. female recently diagnosed with squamous cell carcinoma who presents to symptom management clinic for complaints of  Loss of appetite & dysgeusia- likely due to malignancy. Trial olanzapine  5 mg at bedtime. Encouraged dietary diversity, small frequent calorie dense meals.  Nausea- suspect secondary to antibiotics. Improved with zofran  in clinic. Start olanzapine  as above. Continue zofran  as prescribed for breakthrough symptoms.  Anxiety about her health & cancer- encouraged close follow up and adherence to her medical appointments.   She will follow up with Dr Babara for management of her [facility]. Return to clinic in interim if symptoms do not improve or worsen.    Visit Diagnosis 1. Dysgeusia   2. Nausea without vomiting     Patient expressed understanding and was in agreement with this plan. She also understands that She can call clinic at any time with any questions, concerns, or complaints.   Thank you for allowing me to participate in the care of this pleasant patient.   Tinnie Dawn, DNP, AGNP-C, AOCNP [facility] [phone]  CC: Dr Babara       [1]  Allergies Allergen Reactions   Oxybutynin  Itching and  Other (See Comments)   Pregabalin  Other (See Comments)    Hallucentations   Ace Inhibitors Itching    Rash   Cyclobenzaprine Itching    Rash and 'made me nervous'   "

## 2024-05-28 ENCOUNTER — Inpatient Hospital Stay: Admitting: Hospice and Palliative Medicine

## 2024-05-28 ENCOUNTER — Inpatient Hospital Stay: Attending: Obstetrics and Gynecology | Admitting: Oncology

## 2024-06-01 ENCOUNTER — Other Ambulatory Visit: Payer: Self-pay

## 2024-06-01 ENCOUNTER — Telehealth: Payer: Self-pay | Admitting: Oncology

## 2024-06-01 ENCOUNTER — Telehealth: Payer: Self-pay

## 2024-06-01 DIAGNOSIS — C73 Malignant neoplasm of thyroid gland: Secondary | ICD-10-CM

## 2024-06-01 DIAGNOSIS — C539 Malignant neoplasm of cervix uteri, unspecified: Secondary | ICD-10-CM

## 2024-06-01 DIAGNOSIS — C3490 Malignant neoplasm of unspecified part of unspecified bronchus or lung: Secondary | ICD-10-CM

## 2024-06-01 LAB — "TGAB+THYROGLOBULIN IMA OR RIA ": Thyroglobulin Antibody: 6 [IU]/mL — ABNORMAL HIGH (ref 0.0–0.9)

## 2024-06-01 LAB — THYROGLOBULIN BY RIA: Thyroglobulin by RIA: <2 ng/mL

## 2024-06-01 NOTE — Telephone Encounter (Signed)
 Per Rayfield RAMAN. R/s missed MD/PALL appt.  I called and spoke with pt daughter and confirmed new appts. Pt daughter needed Monday or Tuesday appts since she is off of work those days and can make sure the pt is at the appt

## 2024-06-01 NOTE — Telephone Encounter (Signed)
 Pet scan has been scheduled for June 04, 2024 at 1100. Please arrive 30 minutes prior to the scheduled appointment time. Come into the medical mall entrance at [employer]. The exam will take a minimum of 1 1/2 hours. Please do not eat or drink 6 hours prior to exam--except for water. Avoid gum, mints or candy, including sugar free.  Called and reviewed instructions with daughter, Alisa. Read back performed.

## 2024-06-04 ENCOUNTER — Ambulatory Visit: Admission: RE | Admit: 2024-06-04 | Source: Ambulatory Visit

## 2024-06-04 DIAGNOSIS — C3490 Malignant neoplasm of unspecified part of unspecified bronchus or lung: Secondary | ICD-10-CM

## 2024-06-04 DIAGNOSIS — C73 Malignant neoplasm of thyroid gland: Secondary | ICD-10-CM

## 2024-06-04 DIAGNOSIS — C539 Malignant neoplasm of cervix uteri, unspecified: Secondary | ICD-10-CM

## 2024-06-04 LAB — GLUCOSE, CAPILLARY: Glucose-Capillary: 77 mg/dL (ref 70–99)

## 2024-06-04 MED ORDER — FLUDEOXYGLUCOSE F - 18 (FDG) INJECTION
6.7300 | Freq: Once | INTRAVENOUS | Status: AC | PRN
  Administered 2024-06-04: 6.73 via INTRAVENOUS

## 2024-06-21 ENCOUNTER — Inpatient Hospital Stay: Admitting: Hospice and Palliative Medicine

## 2024-06-21 ENCOUNTER — Inpatient Hospital Stay: Admitting: Oncology

## 2024-08-25 ENCOUNTER — Ambulatory Visit: Admitting: Nurse Practitioner

## 2024-09-29 ENCOUNTER — Inpatient Hospital Stay

## 2024-10-26 ENCOUNTER — Inpatient Hospital Stay

## 2024-10-26 ENCOUNTER — Inpatient Hospital Stay: Admitting: Oncology
